# Patient Record
Sex: Male | Born: 1950 | Race: White | Hispanic: No | Marital: Married | Smoking: Never smoker
Health system: Southern US, Community
[De-identification: ages and names within clinical notes are randomized; demographics above are authoritative.]

## PROBLEM LIST (undated history)

## (undated) DIAGNOSIS — I10 Essential (primary) hypertension: Secondary | ICD-10-CM

## (undated) DIAGNOSIS — M1990 Unspecified osteoarthritis, unspecified site: Secondary | ICD-10-CM

## (undated) DIAGNOSIS — E119 Type 2 diabetes mellitus without complications: Secondary | ICD-10-CM

## (undated) DIAGNOSIS — G4700 Insomnia, unspecified: Secondary | ICD-10-CM

## (undated) DIAGNOSIS — M549 Dorsalgia, unspecified: Secondary | ICD-10-CM

## (undated) DIAGNOSIS — H9190 Unspecified hearing loss, unspecified ear: Secondary | ICD-10-CM

## (undated) DIAGNOSIS — E785 Hyperlipidemia, unspecified: Secondary | ICD-10-CM

## (undated) DIAGNOSIS — C801 Malignant (primary) neoplasm, unspecified: Secondary | ICD-10-CM

## (undated) DIAGNOSIS — G4730 Sleep apnea, unspecified: Secondary | ICD-10-CM

## (undated) HISTORY — DX: Sleep apnea, unspecified: G47.30

## (undated) HISTORY — DX: Dorsalgia, unspecified: M54.9

## (undated) HISTORY — PX: REPLACEMENT TOTAL KNEE: SUR1224

## (undated) HISTORY — PX: ROTATOR CUFF REPAIR: SHX139

## (undated) HISTORY — DX: Hyperlipidemia, unspecified: E78.5

## (undated) HISTORY — DX: Essential (primary) hypertension: I10

## (undated) HISTORY — DX: Insomnia, unspecified: G47.00

## (undated) HISTORY — PX: CARDIAC CATHETERIZATION: SHX172

## (undated) HISTORY — DX: Unspecified hearing loss, unspecified ear: H91.90

---

## 2020-06-24 ENCOUNTER — Encounter: Payer: Self-pay | Admitting: Gastroenterology

## 2020-07-02 ENCOUNTER — Telehealth: Payer: Self-pay

## 2020-07-02 NOTE — Telephone Encounter (Signed)
Called and spoke to patient's wife.  Patient is out of town.  Inquired re: patient's medical hx, PCP, etc.  Patient does not see a PCP but has a Film/video editor in [place].  Requested that patient have Cardiologist, Dr. Sabra Heck fax his records to include last office note, meds, allergies, labs, etc. Wife will call patient and has him to call Dr. Ammie Ferrier office and sign release to have records sent on Monday, 4-18 before NP appointment on Thursday, 4-21. Patient's wife took down our number and fax number and will follow up on records.

## 2020-07-10 ENCOUNTER — Encounter: Payer: Self-pay | Admitting: Gastroenterology

## 2020-07-10 ENCOUNTER — Ambulatory Visit: Payer: Medicare Other | Admitting: Gastroenterology

## 2020-07-10 ENCOUNTER — Other Ambulatory Visit: Payer: Self-pay

## 2020-07-10 VITALS — BP 106/70 | HR 60 | Ht 68.0 in | Wt 193.0 lb

## 2020-07-10 DIAGNOSIS — R1084 Generalized abdominal pain: Secondary | ICD-10-CM

## 2020-07-10 DIAGNOSIS — R194 Change in bowel habit: Secondary | ICD-10-CM | POA: Diagnosis not present

## 2020-07-10 MED ORDER — PLENVU 140 G PO SOLR: 140.0000 g | ORAL | 0 refills | Status: DC

## 2020-07-10 NOTE — Progress Notes (Signed)
07/10/2020 Francisco Frank [id number] Dec 10, 1950   HISTORY OF PRESENT ILLNESS: This is a pleasant 70 year old male who is new to our office.  He is here today as a self-referral to discuss change in bowel habits and generalized abdominal cramping.  He says that these issues just began 2 months ago.  He said he noticed a change in stools with increasing frequency.  He said that he used to have 1 bowel movement a day or every other day.  Now he is going 3 times a day.  The stools are very soft and mushy, but not diarrhea or loose.  He says that he gets generalized abdominal cramps.  He says they are not extremely painful, but uncomfortable.  He says that they used to be not as frequent, but now they occur at least once, sometimes twice a day.  They last 20 minutes sometimes up to an hour.  He also reports of a lot of gas.  He denies seeing any blood in his stools.  He denies any persistent abdominal pain.  He tells me his last colonoscopy was by Dr. Posey Pronto in [place], [place] about 15 or 20 years ago.  He reports being told he had colitis, but was never treated for that.  He does not recall having polyps, but he says he was told to have another colonoscopy in 5 years, which he never proceeded with.  He says that at the time that colonoscopy was performed strictly for screening purposes and he was not having any bowel problems.  He is on Plavix for history of CAD that his prescribed by his cardiologist, Dr. Sabra Heck.   Past Medical History:  Diagnosis Date  . Back pain   . Hearing deficit   . Hyperlipemia   . Hypertension   . Insomnia   . Sleep apnea    Past Surgical History:  Procedure Laterality Date  . CARDIAC CATHETERIZATION    . REPLACEMENT TOTAL KNEE    . ROTATOR CUFF REPAIR      reports that he has never smoked. He quit smokeless tobacco use about 4 years ago.  His smokeless tobacco use included snuff. He reports current alcohol use. He reports that he does not use drugs. family  history includes Alcohol abuse in his father; Cirrhosis in his father; Colon cancer in his maternal grandmother; Colonic polyp in his maternal grandmother; Heart disease in his paternal grandfather. No Known Allergies    Outpatient Encounter Medications as of 07/10/2020  Medication Sig  . atenolol (TENORMIN) 50 MG tablet Take 50 mg by mouth daily.  Marland Kitchen atorvastatin (LIPITOR) 40 MG tablet Take 40 mg by mouth daily.  . benazepril (LOTENSIN) 20 MG tablet Take 20 mg by mouth daily.  . clopidogrel (PLAVIX) 75 MG tablet Take 75 mg by mouth daily.  . traMADol (ULTRAM) 50 MG tablet Take 50 mg by mouth every 4 (four) hours as needed.   No facility-administered encounter medications on file as of 07/10/2020.     REVIEW OF SYSTEMS  : All other systems reviewed and negative except where noted in the History of Present Illness.   PHYSICAL EXAM: BP 106/70   Pulse 60   Ht 5\' 8"  (1.727 m)   Wt 193 lb (87.5 kg)   SpO2 98%   BMI 29.35 kg/m  General: Well developed white male in no acute distress Head: Normocephalic and atraumatic Eyes:  Sclerae anicteric, conjunctiva pink. Ears: Normal auditory acuity Lungs: Clear throughout to auscultation; no W/R/R. Heart: Regular  rate and rhythm; no M/R/G. Abdomen: Soft, non-distended.  BS present.  Non-tender. Rectal:  Will be done at the time of colonoscopy. Musculoskeletal: Symmetrical with no gross deformities  Skin: No lesions on visible extremities Extremities: No edema  Neurological: Alert oriented x 4, grossly non-focal Psychological:  Alert and cooperative. Normal mood and affect  ASSESSMENT AND PLAN: *Change in bowel habits with increased frequency of stools and some related abdominal cramping.  He describes his stools as being mushy, but not diarrhea or loose.  Last colonoscopy 15 to 20 years ago.  He reports some type of colitis.  We do not have those records and he says he was never treated for that.  These issues just began 2 months ago and he  never had any problems in the past.  Not sure what that colitis stuff is about, but he needs another colonoscopy so we will plan to schedule that with Dr. Bryan Lemma.  We discussed trying Benefiber 2 teaspoons mixed in 8 ounces of liquid daily and increasing to twice daily if tolerated to help to bulk his stools.  We discussed trial of antispasmodic, but he prefers to wait on that for now. *Chronic antiplatelet use:  Hold Plavix for 5 days before procedure - will instruct when and how to resume after procedure. Risks and benefits of procedure including bleeding, perforation, infection, missed lesions, medication reactions and possible hospitalization or surgery if complications occur explained. Additional rare but real risk of cardiovascular event such as heart attack or ischemia/infarct of other organs off of Plavix explained and need to seek urgent help if this occurs. Will communicate by phone or EMR with patient's prescribing provider, Dr. Sabra Heck, to confirm that holding Plavix is reasonable in this case.   **The risks, benefits, and alternatives to colonoscopy were discussed with the patient and he consents to proceed.   CC:  No ref. provider found

## 2020-07-10 NOTE — Patient Instructions (Addendum)
If you are age 71 or older, your body mass index should be between 23-30. Your Body mass index is 29.35 kg/m. If this is out of the aforementioned range listed, please consider follow up with your Primary Care Provider.  If you are age 54 or younger, your body mass index should be between 19-25. Your Body mass index is 29.35 kg/m. If this is out of the aformentioned range listed, please consider follow up with your Primary Care Provider.   You have been scheduled for a colonoscopy. Please follow written instructions given to you at your visit today.  Please pick up your prep supplies at the pharmacy within the next 1-3 days. If you use inhalers (even only as needed), please bring them with you on the day of your procedure.  Start Benefiber 2 teaspoons in water or juice. Increase to twice a day as needed.  It was a pleasure to see you today!  Alonza Bogus, PA-C

## 2020-07-15 ENCOUNTER — Telehealth: Payer: Self-pay

## 2020-07-15 ENCOUNTER — Encounter: Payer: Medicare Other | Admitting: Gastroenterology

## 2020-07-15 NOTE — Telephone Encounter (Signed)
Outgoing letter to Dr. Sabra Heck for cardiac clearance to hold Plavix before his scheduled procedure on 08-06-2020. The return fax form [facility] has granted clearance to hold, however he must take ASA 81 mg while off the Plavix. Then the day after procedure restart Plavix and stop the ASA 81 mg.  Patient has been notified and aware to hold and take ASA 81 mg as directed.

## 2020-07-18 ENCOUNTER — Other Ambulatory Visit: Payer: Self-pay | Admitting: Gastroenterology

## 2020-07-21 ENCOUNTER — Other Ambulatory Visit: Payer: Self-pay

## 2020-07-21 ENCOUNTER — Other Ambulatory Visit: Payer: Self-pay | Admitting: Gastroenterology

## 2020-07-21 MED ORDER — CLENPIQ 10-3.5-12 MG-GM -GM/160ML PO SOLN: 320.0000 mL | Freq: Once | ORAL | 0 refills | Status: AC

## 2020-07-21 MED ORDER — SUTAB 1479-225-188 MG PO TABS: 1.0000 | ORAL_TABLET | Freq: Once | ORAL | 0 refills | Status: AC

## 2020-07-23 ENCOUNTER — Encounter: Payer: Medicare Other | Admitting: Internal Medicine

## 2020-08-05 ENCOUNTER — Telehealth: Payer: Self-pay | Admitting: Gastroenterology

## 2020-08-05 NOTE — Telephone Encounter (Signed)
Spoke to patient and we have reviewed the instructions for taking SUTAB.

## 2020-08-05 NOTE — Telephone Encounter (Signed)
Pt is scheduled for colon tomorrow and called to inform that prep that he picked up from his pharmacy yesterday was Sutab, not Plenvu. Pt does not have instructions for that and would like a call back.

## 2020-08-06 ENCOUNTER — Other Ambulatory Visit: Payer: Self-pay

## 2020-08-06 ENCOUNTER — Encounter: Payer: Self-pay | Admitting: Gastroenterology

## 2020-08-06 ENCOUNTER — Ambulatory Visit (AMBULATORY_SURGERY_CENTER): Payer: Medicare Other | Admitting: Gastroenterology

## 2020-08-06 VITALS — BP 110/69 | HR 56 | Temp 96.9°F | Resp 13 | Ht 68.0 in | Wt 193.0 lb

## 2020-08-06 DIAGNOSIS — R194 Change in bowel habit: Secondary | ICD-10-CM | POA: Diagnosis not present

## 2020-08-06 DIAGNOSIS — D125 Benign neoplasm of sigmoid colon: Secondary | ICD-10-CM

## 2020-08-06 DIAGNOSIS — R1084 Generalized abdominal pain: Secondary | ICD-10-CM | POA: Diagnosis not present

## 2020-08-06 DIAGNOSIS — R197 Diarrhea, unspecified: Secondary | ICD-10-CM

## 2020-08-06 DIAGNOSIS — K5730 Diverticulosis of large intestine without perforation or abscess without bleeding: Secondary | ICD-10-CM

## 2020-08-06 DIAGNOSIS — R109 Unspecified abdominal pain: Secondary | ICD-10-CM

## 2020-08-06 MED ORDER — SODIUM CHLORIDE 0.9 % IV SOLN: 500.0000 mL | Freq: Once | INTRAVENOUS | Status: DC

## 2020-08-06 MED ORDER — DICYCLOMINE HCL 10 MG PO CAPS: 10.0000 mg | ORAL_CAPSULE | ORAL | 1 refills | Status: DC | PRN

## 2020-08-06 NOTE — Op Note (Signed)
[facility] Patient Name: Francisco Frank Procedure Date: 08/06/2020 10:38 AM MRN: [id number] Endoscopist: Gerrit Heck , MD Age: 70 Referring MD:  Date of Birth: May 17, 1950 Gender: Male Account #: [id number] Procedure:                Colonoscopy Indications:              Generalized abdominal cramping, Change in bowel                            habits, Diarrhea                           Last colonoscopy was >15 years ago. Medicines:                Monitored Anesthesia Care Procedure:                Pre-Anesthesia Assessment:                           - Prior to the procedure, a History and Physical                            was performed, and patient medications and                            allergies were reviewed. The patient's tolerance of                            previous anesthesia was also reviewed. The risks                            and benefits of the procedure and the sedation                            options and risks were discussed with the patient.                            All questions were answered, and informed consent                            was obtained. Prior Anticoagulants: The patient has                            taken no previous anticoagulant or antiplatelet                            agents. ASA Grade Assessment: II - A patient with                            mild systemic disease. After reviewing the risks                            and benefits, the patient was deemed in  satisfactory condition to undergo the procedure.                           After obtaining informed consent, the colonoscope                            was passed under direct vision. Throughout the                            procedure, the patient's blood pressure, pulse, and                            oxygen saturations were monitored continuously. The                            Olympus CF-HQ190L ([id number]) Colonoscope was                             introduced through the anus and advanced to the the                            terminal ileum. The colonoscopy was performed                            without difficulty. The patient tolerated the                            procedure well. The quality of the bowel                            preparation was good. The terminal ileum, ileocecal                            valve, appendiceal orifice, and rectum were                            photographed. Scope In: 10:46:10 AM Scope Out: 11:02:59 AM Scope Withdrawal Time: 0 hours 14 minutes 21 seconds  Total Procedure Duration: 0 hours 16 minutes 49 seconds  Findings:                 The perianal and digital rectal examinations were                            normal.                           A 8 mm polyp was found in the sigmoid colon. The                            polyp was sessile. The polyp was removed with a                            cold snare. Resection and retrieval were complete.  Estimated blood loss was minimal.                           A few small and large-mouthed diverticula were                            found in the sigmoid colon.                           Normal mucosa was found in the entire colon.                            Biopsies for histology were taken with a cold                            forceps from the right colon and left colon for                            evaluation of microscopic colitis. Estimated blood                            loss was minimal.                           The retroflexed view of the distal rectum and anal                            verge was normal and showed no anal or rectal                            abnormalities.                           The terminal ileum appeared normal. Complications:            No immediate complications. Estimated Blood Loss:     Estimated blood loss was minimal. Impression:               - One 8 mm polyp in the  sigmoid colon, removed with                            a cold snare. Resected and retrieved.                           - Diverticulosis in the sigmoid colon.                           - Normal mucosa in the entire examined colon.                            Biopsied.                           - The distal rectum and anal verge are normal on  retroflexion view.                           - The examined portion of the ileum was normal. Recommendation:           - Patient has a contact number available for                            emergencies. The signs and symptoms of potential                            delayed complications were discussed with the                            patient. Return to normal activities tomorrow.                            Written discharge instructions were provided to the                            patient.                           - Resume previous diet.                           - Continue present medications.                           - Await pathology results.                           - Repeat colonoscopy for surveillance based on                            pathology results.                           - Return to GI office at appointment to be                            scheduled.                           - Use fiber, for example Citrucel, Fibercon, Konsyl                            or Metamucil.                           - Use Bentyl (dicyclomine) 10 mg PO Q6 hours as                            needed for abdominal cramping. Gerrit Heck, MD 08/06/2020 11:09:06 AM

## 2020-08-06 NOTE — Progress Notes (Signed)
PT taken to PACU. Monitors in place. VSS. Report given to RN. 

## 2020-08-06 NOTE — Patient Instructions (Signed)
YOU HAD AN ENDOSCOPIC PROCEDURE TODAY AT THE [facility]:   Refer to the procedure report that was given to you for any specific questions about what was found during the examination.  If the procedure report does not answer your questions, please call your gastroenterologist to clarify.  If you requested that your care partner not be given the details of your procedure findings, then the procedure report has been included in a sealed envelope for you to review at your convenience later.  YOU SHOULD EXPECT: Some feelings of bloating in the abdomen. Passage of more gas than usual.  Walking can help get rid of the air that was put into your GI tract during the procedure and reduce the bloating. If you had a lower endoscopy (such as a colonoscopy or flexible sigmoidoscopy) you may notice spotting of blood in your stool or on the toilet paper. If you underwent a bowel prep for your procedure, you may not have a normal bowel movement for a few days.  Please Note:  You might notice some irritation and congestion in your nose or some drainage.  This is from the oxygen used during your procedure.  There is no need for concern and it should clear up in a day or so.  SYMPTOMS TO REPORT IMMEDIATELY:   Following lower endoscopy (colonoscopy or flexible sigmoidoscopy):  Excessive amounts of blood in the stool  Significant tenderness or worsening of abdominal pains  Swelling of the abdomen that is new, acute  Fever of 100F or higher   Following upper endoscopy (EGD)  Vomiting of blood or coffee ground material  New chest pain or pain under the shoulder blades  Painful or persistently difficult swallowing  New shortness of breath  Fever of 100F or higher  Black, tarry-looking stools  For urgent or emergent issues, a gastroenterologist can be reached at any hour by calling [phone]. Do not use MyChart messaging for urgent concerns.    DIET:  We do recommend a small meal at first, but  then you may proceed to your regular diet.  Drink plenty of fluids but you should avoid alcoholic beverages for 24 hours.  ACTIVITY:  You should plan to take it easy for the rest of today and you should NOT DRIVE or use heavy machinery until tomorrow (because of the sedation medicines used during the test).    FOLLOW UP: Our staff will call the number listed on your records 48-72 hours following your procedure to check on you and address any questions or concerns that you may have regarding the information given to you following your procedure. If we do not reach you, we will leave a message.  We will attempt to reach you two times.  During this call, we will ask if you have developed any symptoms of COVID 19. If you develop any symptoms (ie: fever, flu-like symptoms, shortness of breath, cough etc.) before then, please call [phone].  If you test positive for Covid 19 in the 2 weeks post procedure, please call and report this information to us.    If any biopsies were taken you will be contacted by phone or by letter within the next 1-3 weeks.  Please call us at [phone] if you have not heard about the biopsies in 3 weeks.    SIGNATURES/CONFIDENTIALITY: You and/or your care partner have signed paperwork which will be entered into your electronic medical record.  These signatures attest to the fact that that the information above on   your After Visit Summary has been reviewed and is understood.  Full responsibility of the confidentiality of this discharge information lies with you and/or your care-partner. 

## 2020-08-07 ENCOUNTER — Telehealth: Payer: Self-pay

## 2020-08-07 NOTE — Telephone Encounter (Signed)
Per 08/06/20 procedure report - Return to GI office at appointment to be scheduled  Spoke with patient and he has been scheduled for a follow up with Alonza Bogus, PA on Tuesday, 09/02/20 at 11 AM. Advised patient that I will send a letter to his home address on file with the appointment information. Patient verbalized understanding and had no concerns at the end of the call.

## 2020-08-08 ENCOUNTER — Telehealth: Payer: Self-pay | Admitting: *Deleted

## 2020-08-08 NOTE — Telephone Encounter (Signed)
1. Have you developed a fever since your procedure? no  2.   Have you had an respiratory symptoms (SOB or cough) since your procedure? no  3.   Have you tested positive for COVID 19 since your procedure no  4.   Have you had any family members/close contacts diagnosed with the COVID 19 since your procedure?  no   If yes to any of these questions please route to Joylene John, RN and Joella Prince, RN Follow up Call-  Call back number 08/06/2020  Post procedure Call Back phone  # [phone]  Permission to leave phone message Yes     Patient questions:  Do you have a fever, pain , or abdominal swelling? No. Pain Score  0 *  Have you tolerated food without any problems? Yes.    Have you been able to return to your normal activities? Yes.    Do you have any questions about your discharge instructions: Diet   No. Medications  No. Follow up visit  No.  Do you have questions or concerns about your Care? No.  Actions: * If pain score is 4 or above: No action needed, pain <4.

## 2020-08-14 ENCOUNTER — Encounter: Payer: Self-pay | Admitting: Gastroenterology

## 2020-08-30 ENCOUNTER — Other Ambulatory Visit: Payer: Self-pay | Admitting: Gastroenterology

## 2020-08-30 DIAGNOSIS — R197 Diarrhea, unspecified: Secondary | ICD-10-CM

## 2020-08-30 DIAGNOSIS — R1084 Generalized abdominal pain: Secondary | ICD-10-CM

## 2020-08-30 DIAGNOSIS — R194 Change in bowel habit: Secondary | ICD-10-CM

## 2020-08-30 DIAGNOSIS — R109 Unspecified abdominal pain: Secondary | ICD-10-CM

## 2020-09-02 ENCOUNTER — Ambulatory Visit: Payer: Medicare Other | Admitting: Gastroenterology

## 2020-09-03 ENCOUNTER — Telehealth: Payer: Self-pay | Admitting: *Deleted

## 2020-09-03 ENCOUNTER — Encounter: Payer: Self-pay | Admitting: Gastroenterology

## 2020-09-03 ENCOUNTER — Telehealth: Payer: Self-pay | Admitting: Gastroenterology

## 2020-09-03 ENCOUNTER — Other Ambulatory Visit (INDEPENDENT_AMBULATORY_CARE_PROVIDER_SITE_OTHER): Payer: Medicare Other

## 2020-09-03 ENCOUNTER — Ambulatory Visit: Payer: Medicare Other | Admitting: Gastroenterology

## 2020-09-03 VITALS — BP 118/74 | HR 59 | Ht 68.0 in | Wt 175.0 lb

## 2020-09-03 DIAGNOSIS — R1084 Generalized abdominal pain: Secondary | ICD-10-CM

## 2020-09-03 DIAGNOSIS — R634 Abnormal weight loss: Secondary | ICD-10-CM | POA: Insufficient documentation

## 2020-09-03 DIAGNOSIS — R5383 Other fatigue: Secondary | ICD-10-CM

## 2020-09-03 DIAGNOSIS — R631 Polydipsia: Secondary | ICD-10-CM | POA: Diagnosis not present

## 2020-09-03 LAB — CBC
HCT: 41.1 % (ref 39.0–52.0)
Hemoglobin: 14.1 g/dL (ref 13.0–17.0)
MCHC: 34.3 g/dL (ref 30.0–36.0)
MCV: 91.2 fl (ref 78.0–100.0)
Platelets: 190 10*3/uL (ref 150.0–400.0)
RBC: 4.5 Mil/uL (ref 4.22–5.81)
RDW: 13.3 % (ref 11.5–15.5)
WBC: 9 10*3/uL (ref 4.0–10.5)

## 2020-09-03 LAB — COMPREHENSIVE METABOLIC PANEL
ALT: 29 U/L (ref 0–53)
AST: 16 U/L (ref 0–37)
Albumin: 4.5 g/dL (ref 3.5–5.2)
Alkaline Phosphatase: 96 U/L (ref 39–117)
BUN: 22 mg/dL (ref 6–23)
CO2: 24 mEq/L (ref 19–32)
Calcium: 9.8 mg/dL (ref 8.4–10.5)
Chloride: 92 mEq/L — ABNORMAL LOW (ref 96–112)
Creatinine, Ser: 1.33 mg/dL (ref 0.40–1.50)
GFR: 53.89 mL/min — ABNORMAL LOW (ref >60.00)
Glucose, Bld: 731 mg/dL (ref 70–99)
Potassium: 4.4 mEq/L (ref 3.5–5.1)
Sodium: 127 mEq/L — ABNORMAL LOW (ref 135–145)
Total Bilirubin: 0.7 mg/dL (ref 0.2–1.2)
Total Protein: 7.5 g/dL (ref 6.0–8.3)

## 2020-09-03 LAB — IBC + FERRITIN
Ferritin: 665.4 ng/mL — ABNORMAL HIGH (ref 22.0–322.0)
Iron: 96 ug/dL (ref 42–165)
Saturation Ratios: 25.5 % (ref 20.0–50.0)
Transferrin: 269 mg/dL (ref 212.0–360.0)

## 2020-09-03 LAB — VITAMIN D 25 HYDROXY (VIT D DEFICIENCY, FRACTURES): VITD: 26.08 ng/mL — ABNORMAL LOW (ref 30.00–100.00)

## 2020-09-03 LAB — B12 AND FOLATE PANEL
Folate: 18.7 ng/mL (ref >5.9)
Vitamin B-12: 516 pg/mL (ref 211–911)

## 2020-09-03 NOTE — Telephone Encounter (Signed)
Pt would like to r/s CT scan because it conflicts with her wife's scheduled colonoscopy.

## 2020-09-03 NOTE — Telephone Encounter (Signed)
Lab called with a critical lab report patient's glucose is 731. Per Janett Billow, [place] patient needs to go to ER. Spoke with patient to inform him that he needs to go the ER because he blood sugar is 731.

## 2020-09-03 NOTE — Progress Notes (Signed)
09/03/2020 Francisco Frank [id number] 07-Oct-1950   HISTORY OF PRESENT ILLNESS: This is a 70 year old male who is a patient Dr. Vivia Ewing.  He just underwent colonoscopy in May for evaluation of some change in bowel habits.  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  Repeat recommended at a 7-year interval.  He is here today for follow-up.  He tells me that over the past week or so his stools have mostly been back to normal.  His major concern at this point is about a 20 pound weight loss since April.  He says he gets extremely fatigued easily.  He says that he is one who tends to be on the go all the time, but even just trimming a couple of shrubs at his house has him completely wiped out.  He denies really any shortness of breath or chest pain.  He also reports that he is drinking a ton of fluids recently and continues to have a dry mouth.  He also reports some vague upper abdominal pain/discomfort that tends to come and go.  He says that it is not there all the time.  He does not have a PCP.  He tells me that his cardiologist did some blood work back in May, but he is unsure where he checked.  He thinks that he they usually check his cholesterol levels, blood sugar, etc.    Past Medical History:  Diagnosis Date   Back pain    Hearing deficit    Hyperlipemia    Hypertension    Insomnia    Sleep apnea    Past Surgical History:  Procedure Laterality Date   CARDIAC CATHETERIZATION     REPLACEMENT TOTAL KNEE     ROTATOR CUFF REPAIR      reports that he has never smoked. His smokeless tobacco use includes snuff. He reports current alcohol use. He reports that he does not use drugs. family history includes Alcohol abuse in his father; Cirrhosis in his father; Colon cancer in his maternal grandmother; Colonic polyp in his maternal grandmother; Heart disease in his paternal grandfather. No Known Allergies    Outpatient  Encounter Medications as of 09/03/2020  Medication Sig   atenolol (TENORMIN) 50 MG tablet Take 50 mg by mouth daily.   atorvastatin (LIPITOR) 40 MG tablet Take 40 mg by mouth daily.   benazepril (LOTENSIN) 20 MG tablet Take 20 mg by mouth daily.   clopidogrel (PLAVIX) 75 MG tablet Take 75 mg by mouth daily.   dicyclomine (BENTYL) 10 MG capsule Take 1 capsule (10 mg total) by mouth as needed for spasms. 10 mg po q 6 hours prn   traMADol (ULTRAM) 50 MG tablet Take 50 mg by mouth every 4 (four) hours as needed.   No facility-administered encounter medications on file as of 09/03/2020.     REVIEW OF SYSTEMS  : All other systems reviewed and negative except where noted in the History of Present Illness.   PHYSICAL EXAM: BP 118/74   Pulse (!) 59   Ht 5\' 8"  (1.727 m)   Wt 175 lb (79.4 kg)   SpO2 97%   BMI 26.61 kg/m  General: Well developed white male in no acute distress Head: Normocephalic and atraumatic Eyes:  Sclerae anicteric, conjunctiva pink. Ears: Normal auditory acuity Lungs: Clear throughout to auscultation; no W/R/R. Heart: Regular rate and rhythm; no M/R/G. Abdomen: Soft, nontender, non distended. No masses or hepatomegal Musculoskeletal:  Symmetrical with no gross deformities  Skin: No lesions on visible extremities Extremities: No edema  Neurological: Alert oriented x 4, grossly non-focal Psychological:  Alert and cooperative. Normal mood and affect  ASSESSMENT AND PLAN: *70 year old male with complaints of 20 pound weight loss since April, extreme fatigue, generalized abdominal pain, but more so in the upper abdomen.  He does not have a PCP.  He tells me he had labs done just last month by his cardiologist in [place], [place], but unsure what he checked.  Unfortunately I do not have access to those labs.  I am going to check some blood work today including CBC, CMP, TSH, iron studies, vitamin B12 and folate levels, and vitamin D levels.  He needs to establish with a PCP  especially with his advancing age.  Will plan for CT scan of the abdomen and pelvis without contrast to start due to IV contrast shortage. *Polydipsia/dry mouth: Drinking extremely large amounts of liquid every day.  Only thing I can think of would be new onset diabetes versus Sjogren's.  He thinks that his blood sugar was recently checked by his cardiologist.  We are going to recheck it today as above.  **Addendum: Received a critical lab with a glucose of over 700.  Patient has been contacted and sent to the emergency department.   CC:  No ref. provider found

## 2020-09-03 NOTE — Patient Instructions (Signed)
Your provider has requested that you go to the basement level for lab work before leaving today. Press "B" on the elevator. The lab is located at the first door on the left as you exit the elevator.  You have been scheduled for a CT scan of the abdomen and pelvis at [facility] ([address]---this is in the same building as [employer]).   You are scheduled on Tuesday 09/09/20 at 10:30 am. You should arrive 15 minutes prior to your appointment time for registration. Please follow the written instructions below on the day of your exam:  WARNING: IF YOU ARE ALLERGIC TO IODINE/X-RAY DYE, PLEASE NOTIFY RADIOLOGY IMMEDIATELY AT [phone]! YOU WILL BE GIVEN A 13 HOUR PREMEDICATION PREP.  1) Do not eat or drink anything after 6:30 am (4 hours prior to your test) 2) You have been given 2 bottles of oral contrast to drink. The solution may taste better if refrigerated, but do NOT add ice or any other liquid to this solution. Shake well before drinking.    Drink 1 bottle of contrast @ 8:30 am (2 hours prior to your exam)  Drink 1 bottle of contrast @ 9:30 am (1 hour prior to your exam)  You may take any medications as prescribed with a small amount of water, if necessary. If you take any of the following medications: METFORMIN, GLUCOPHAGE, GLUCOVANCE, AVANDAMET, RIOMET, FORTAMET, [place] MET, JANUMET, GLUMETZA or METAGLIP, you MAY be asked to HOLD this medication 48 hours AFTER the exam.  The purpose of you drinking the oral contrast is to aid in the visualization of your intestinal tract. The contrast solution may cause some diarrhea. Depending on your individual set of symptoms, you may also receive an intravenous injection of x-ray contrast/dye. Plan on being at [facility] for 30 minutes or longer, depending on the type of exam you are having performed.  This test typically takes 30-45 minutes to complete.  If you have any questions regarding your exam or if you  need to reschedule, you may call the CT department at [phone] between the hours of 8:00 am and 5:00 pm, Monday-Friday.  ______________________________________________________________  If you are age 71 or older, your body mass index should be between 23-30. Your Body mass index is 26.61 kg/m. If this is out of the aforementioned range listed, please consider follow up with your Primary Care Provider.  If you are age 69 or younger, your body mass index should be between 19-25. Your Body mass index is 26.61 kg/m. If this is out of the aformentioned range listed, please consider follow up with your Primary Care Provider.   __________________________________________________________  The [facility] providers would like to encourage you to use [facility] to communicate with providers for non-urgent requests or questions.  Due to long hold times on the telephone, sending your provider a message by [facility] may be a faster and more efficient way to get a response.  Please allow 48 business hours for a response.  Please remember that this is for non-urgent requests.

## 2020-09-04 NOTE — Progress Notes (Signed)
Agree with the assessment and plan as outlined by Jessica Zehr, PA-C. ? ?Amaro Mangold, DO, FACG ? ?

## 2020-09-04 NOTE — Telephone Encounter (Signed)
Spoke with patient's wife and patient was admitted to [facility] yesterday due to his blood sugar and they did a CT scan. Patient's wife will have his stop by and sign a records release on Tuesday when they are here for her colonoscopy.

## 2020-09-05 NOTE — Telephone Encounter (Signed)
Spoke with Beatrice CT and canceled CT scan.

## 2020-09-09 ENCOUNTER — Inpatient Hospital Stay: Admission: RE | Admit: 2020-09-09 | Payer: Medicare Other | Source: Ambulatory Visit

## 2020-10-20 ENCOUNTER — Encounter: Payer: Self-pay | Admitting: Physician Assistant

## 2020-10-20 ENCOUNTER — Other Ambulatory Visit (INDEPENDENT_AMBULATORY_CARE_PROVIDER_SITE_OTHER): Payer: Medicare Other

## 2020-10-20 ENCOUNTER — Ambulatory Visit: Payer: Medicare Other | Admitting: Physician Assistant

## 2020-10-20 VITALS — BP 120/70 | HR 102 | Ht 68.0 in | Wt 167.0 lb

## 2020-10-20 DIAGNOSIS — R1013 Epigastric pain: Secondary | ICD-10-CM | POA: Diagnosis not present

## 2020-10-20 DIAGNOSIS — R634 Abnormal weight loss: Secondary | ICD-10-CM | POA: Diagnosis not present

## 2020-10-20 DIAGNOSIS — R933 Abnormal findings on diagnostic imaging of other parts of digestive tract: Secondary | ICD-10-CM | POA: Diagnosis not present

## 2020-10-20 DIAGNOSIS — R195 Other fecal abnormalities: Secondary | ICD-10-CM

## 2020-10-20 LAB — COMPREHENSIVE METABOLIC PANEL
ALT: 252 U/L — ABNORMAL HIGH (ref 0–53)
AST: 281 U/L — ABNORMAL HIGH (ref 0–37)
Albumin: 4.5 g/dL (ref 3.5–5.2)
Alkaline Phosphatase: 269 U/L — ABNORMAL HIGH (ref 39–117)
BUN: 31 mg/dL — ABNORMAL HIGH (ref 6–23)
CO2: 21 mEq/L (ref 19–32)
Calcium: 9.8 mg/dL (ref 8.4–10.5)
Chloride: 107 mEq/L (ref 96–112)
Creatinine, Ser: 1.87 mg/dL — ABNORMAL HIGH (ref 0.40–1.50)
GFR: 35.77 mL/min — ABNORMAL LOW (ref >60.00)
Glucose, Bld: 181 mg/dL — ABNORMAL HIGH (ref 70–99)
Potassium: 5.1 mEq/L (ref 3.5–5.1)
Sodium: 136 mEq/L (ref 135–145)
Total Bilirubin: 0.6 mg/dL (ref 0.2–1.2)
Total Protein: 7.5 g/dL (ref 6.0–8.3)

## 2020-10-20 LAB — CBC WITH DIFFERENTIAL/PLATELET
Basophils Absolute: 0.1 10*3/uL (ref 0.0–0.1)
Basophils Relative: 2 % (ref 0.0–3.0)
Eosinophils Absolute: 0.1 10*3/uL (ref 0.0–0.7)
Eosinophils Relative: 2.4 % (ref 0.0–5.0)
HCT: 39.5 % (ref 39.0–52.0)
Hemoglobin: 13.1 g/dL (ref 13.0–17.0)
Lymphocytes Relative: 37.9 % (ref 12.0–46.0)
Lymphs Abs: 2.3 10*3/uL (ref 0.7–4.0)
MCHC: 33 g/dL (ref 30.0–36.0)
MCV: 92.3 fl (ref 78.0–100.0)
Monocytes Absolute: 0.5 10*3/uL (ref 0.1–1.0)
Monocytes Relative: 8.9 % (ref 3.0–12.0)
Neutro Abs: 3 10*3/uL (ref 1.4–7.7)
Neutrophils Relative %: 48.8 % (ref 43.0–77.0)
Platelets: 169 10*3/uL (ref 150.0–400.0)
RBC: 4.28 Mil/uL (ref 4.22–5.81)
RDW: 14.5 % (ref 11.5–15.5)
WBC: 6.2 10*3/uL (ref 4.0–10.5)

## 2020-10-20 LAB — LIPASE: Lipase: 23 U/L (ref 11.0–59.0)

## 2020-10-20 NOTE — Patient Instructions (Signed)
If you are age 71 or younger, your body mass index should be between 19-25. Your Body mass index is 25.39 kg/m. If this is out of the aformentioned range listed, please consider follow up with your Primary Care Provider.  __________________________________________________________  The [place] [facility] providers would like to encourage you to use [facility] to communicate with providers for non-urgent requests or questions.  Due to long hold times on the telephone, sending your provider a message by [facility] may be a faster and more efficient way to get a response.  Please allow 48 business hours for a response.  Please remember that this is for non-urgent requests.   You have been scheduled for an MRI at [facility] on 10/29/2020. Your appointment time is 9:00 am. Please arrive to admitting (at main entrance of the hospital) 30 minutes prior to your appointment time for registration purposes. Please make certain not to have anything to eat or drink 4 hours prior to your test. In addition, if you have any metal in your body, have a pacemaker or defibrillator, please be sure to let your ordering physician know. This test typically takes 45 minutes to 1 hour to complete. Should you need to reschedule, please call [phone] to do so.  Your provider has requested that you go to the basement level for lab work before leaving today. Press "B" on the elevator. The lab is located at the first door on the left as you exit the elevator.  Follow up pending the results of your MRI.  Thank you for entrusting me with your care and choosing [facility].  Amy Esterwood, PA-C

## 2020-10-20 NOTE — Progress Notes (Signed)
Subjective:    Patient ID: Francisco Frank, male    DOB: 09-24-1950, 70 y.o.   MRN: [id number]  HPI Francisco Frank is a pleasant 70 year old white male, established with Dr. Bryan Frank.  He comes in today with continued weight loss and loose stools and to discuss recent CT scan. He had undergone colonoscopy here in May 2022 because of changes in bowel habits with loose stools.  He was found to have diverticulosis and one 8 mm polyp was removed which was a tubular adenoma. He was seen back 09/03/2020 by Francisco Bogus, PA-C with complaints of persistent weight loss and at that point had lost about 20 pounds since April.  He was also complaining of significant fatigue and polydipsia and vague upper abdominal discomfort. He had multiple labs drawn and was noted to have a sodium of 127, and glucose of 731 BUN 22/creatinine 1.33, LFTs within normal limits, CBC within normal limits Ferritin 665/iron 96/transferrin 269/iron sat 25.5. He was referred to the emergency room that same day because of the severe hyperglycemia.  He was admitted to the hospital in [place], initially started on sliding scale insulin and has now been converted to NovoLog.  He has established with a PCP/Dr. Shearon Frank in [place]. He had a CT of the abdomen pelvis done while he was hospitalized and he brings a copy with him today.  This was done without IV contrast and showed apparent minimal stranding at the pancreatic head may be due to parenchymal atrophy simulating fat stranding however recommend correlation with lipase to exclude pancreatitis.  He has cholelithiasis without cholecystitis and moderate stool burden as well as colonic diverticulosis.  Patient says despite having good control of his blood sugars which she has been monitoring very closely he is still losing weight.  He is eating 3-4 meals per day, has no complaints of nausea or vomiting.  He is down about 33 pounds total since the end of February. He also continues to feel very  fatigued and says he wears out very easily.  The thirst has resolved.  He is having bowel movements which she describes as mushy and oily.  Also with intermittent cramping and vague nagging discomfort in the upper abdomen no ongoing significant abdominal pain.  He has no prior history of pancreatitis. He mentions that nobody in his family has had diabetes. On further questioning he had been a heavy consumer of alcohol over many years and says that he cut way back about 10 to 15 years ago but will still drink some alcohol socially and may have 2-3 drinks at a time. Review of Systems.  Pertinent positive and negative review of systems were noted in the above HPI section.  All other review of systems was otherwise negative.   Outpatient Encounter Medications as of 10/20/2020  Medication Sig   atenolol (TENORMIN) 50 MG tablet Take 50 mg by mouth daily.   atorvastatin (LIPITOR) 40 MG tablet Take 40 mg by mouth daily.   benazepril (LOTENSIN) 20 MG tablet Take 20 mg by mouth daily.   clopidogrel (PLAVIX) 75 MG tablet Take 75 mg by mouth daily.   dicyclomine (BENTYL) 10 MG capsule Take 1 capsule (10 mg total) by mouth as needed for spasms. 10 mg po q 6 hours prn   Insulin Glargine (BASAGLAR KWIKPEN) 100 UNIT/ML Inject into the skin.   NOVOLOG FLEXPEN 100 UNIT/ML FlexPen SMARTSIG:10 Unit(s) SUB-Q 3 Times Daily   traMADol (ULTRAM) 50 MG tablet Take 50 mg by mouth every 4 (four) hours as  needed.   No facility-administered encounter medications on file as of 10/20/2020.   No Known Allergies Patient Active Problem List   Diagnosis Date Noted   Loss of weight 09/03/2020   Fatigue 09/03/2020   Polydipsia 09/03/2020   Change in bowel habit 07/10/2020   Generalized abdominal pain 07/10/2020   Social History   Socioeconomic History   Marital status: Married    Spouse name: Not on file   Number of children: Not on file   Years of education: Not on file   Highest education level: Not on file  Occupational  History   Not on file  Tobacco Use   Smoking status: Never   Smokeless tobacco: Current    Types: Snuff    Last attempt to quit: 2018  Vaping Use   Vaping Use: Never used  Substance and Sexual Activity   Alcohol use: Yes    Comment: rare   Drug use: Never   Sexual activity: Not Currently  Other Topics Concern   Not on file  Social History Narrative   Not on file   Social Determinants of Health   Financial Resource Strain: Not on file  Food Insecurity: Not on file  Transportation Needs: Not on file  Physical Activity: Not on file  Stress: Not on file  Social Connections: Not on file  Intimate Partner Violence: Not on file    [name] family history includes Alcohol abuse in his father; Cirrhosis in his father; Colon cancer in his maternal grandmother; Colonic polyp in his maternal grandmother; Heart disease in his paternal grandfather.      Objective:    Vitals:   10/20/20 1046  BP: 120/70  Pulse: (!) 102  SpO2: 98%    Physical Exam Well-developed well-nourished older white male in no acute distress.  Accompanied by his wife weight, 167 BMI 25.3  HEENT; nontraumatic normocephalic, EOMI, PE R LA, sclera anicteric. Oropharynx; not examined today Neck; supple, no JVD Cardiovascular; regular rate and rhythm with S1-S2, no murmur rub or gallop Pulmonary; Clear bilaterally Abdomen; soft, no focal tenderness nondistended, no palpable mass or hepatosplenomegaly, bowel sounds are active Rectal; not done today Skin; benign exam, no jaundice rash or appreciable lesions Extremities; no clubbing cyanosis or edema skin warm and dry Neuro/Psych; alert and oriented x4, grossly nonfocal mood and affect appropriate        Assessment & Plan:   #51 70 year old white male with new diagnosis of diabetes, presenting with fatigue, weight loss and polydipsia in mid June.  Found to have glucose of greater than 700. He has been started on insulin, and is monitoring his blood  sugars carefully.  Blood sugars appear to be under good control. Patient is continued to have persistent weight loss and is now down 33 pounds since February 2022.  He continues to complain of fatigue, vague upper abdominal discomfort, and has had persistent changes in his bowel habits with what he describes as mushy oily/floating stools.  In addition he had noncontrasted CT imaging while he was hospitalized in [place] at the time he was found to have the severe hyperglycemia in mid June. CT suggested some minimal stranding about the pancreatic head versus possible pancreatic atrophy.  With above constellation of symptoms will need to rule out pancreatic neoplasm, rule out underlying chronic pancreatitis with pancreatic insufficiency.  #2 cholelithiasis #3 diverticulosis #4  Adenomatous colon polyp-just had colonoscopy May 2022 #5 previous history of heavy EtOH  Plan; Patient will be scheduled for MRI of the  abdomen/attention pancreas CBC with differential, c-Met, lipase, fecal elastase. We discussed lower fat diet in addition to his diabetic restrictions Continue dicyclomine 10 mg every 6 hours as needed until further diagnostic work-up completed.  Further recommendations pending results of MRI and above labs.  Theodosia Bahena S Naraya Stoneberg PA-C 10/20/2020   Cc: No ref. provider found

## 2020-10-21 ENCOUNTER — Other Ambulatory Visit: Payer: Self-pay

## 2020-10-21 DIAGNOSIS — R7989 Other specified abnormal findings of blood chemistry: Secondary | ICD-10-CM

## 2020-10-21 DIAGNOSIS — I10 Essential (primary) hypertension: Secondary | ICD-10-CM

## 2020-10-23 ENCOUNTER — Telehealth: Payer: Self-pay | Admitting: Physician Assistant

## 2020-10-23 NOTE — Telephone Encounter (Signed)
Patient has seen his PCP. Dr Shearon Stalls is referring him to [facility]. Patient requests his recent labs be faxed to there at [phone].

## 2020-10-27 ENCOUNTER — Telehealth: Payer: Self-pay | Admitting: Physician Assistant

## 2020-10-27 NOTE — Telephone Encounter (Signed)
Called patient's insurance company. The reason for peer review was discussed. Advised that patient's CT which was done at a different facility did not give enough information to treat the patient as the CT shows a concern for cancer. A copy of patient's CT has been faxed to his insurance company for review.

## 2020-10-27 NOTE — Telephone Encounter (Signed)
Ileene Musa, could you please help me with scheduling a peer to peer with Nicoletta Ba for this pt's MRI, clinicals have been reviewed but a peer to peer is the next option to get this approved. Thank you!  Case# [id number] Peer to peer scheduling:  [place]

## 2020-10-28 NOTE — Progress Notes (Signed)
Agree with the assessment and plan as outlined by Amy Esterwood, PA-C.  Lasondra Hodgkins, DO, FACG  

## 2020-10-28 NOTE — Telephone Encounter (Signed)
Called to follow up on prior authorization. Notice should come through this afternoon. It looks like the Abdomen will get approved but the pelvis will not. I'll send a message to Amy to let her know.

## 2020-10-29 ENCOUNTER — Ambulatory Visit (HOSPITAL_COMMUNITY): Admission: RE | Admit: 2020-10-29 | Payer: Medicare Other | Source: Ambulatory Visit

## 2020-11-05 ENCOUNTER — Telehealth: Payer: Self-pay | Admitting: Physician Assistant

## 2020-11-05 NOTE — Telephone Encounter (Signed)
Inbound call from patient's PCP, Dr. Shearon Stalls requesting a call from App please.  States patient is looking more jaundice.

## 2020-11-05 NOTE — Telephone Encounter (Signed)
I called the office of Dr Shearon Stalls but did not get to speak with anyone. I left a message on the nurse line asking to verify the telephone number. I left my desk number and the office telephone number.

## 2020-11-05 NOTE — Telephone Encounter (Addendum)
Dr Bryan Lemma This is a patient seen by Nicoletta Ba, PA for concerns of weight loss, elevated LFT and concerns of pancreatic neoplasm.  Amy Trellis Paganini is out of the office today due to illness. The PCP is concerned about the patient and would like to speak with a provider. The patient is scheduled for labs and an MRI of the abdomen tomorrow.  Would you call the PCP and speak with his regarding the patient? Dr Shearon Stalls [phone]

## 2020-11-06 ENCOUNTER — Other Ambulatory Visit (INDEPENDENT_AMBULATORY_CARE_PROVIDER_SITE_OTHER): Payer: Medicare Other

## 2020-11-06 ENCOUNTER — Other Ambulatory Visit: Payer: Self-pay

## 2020-11-06 ENCOUNTER — Encounter: Payer: Self-pay | Admitting: Gastroenterology

## 2020-11-06 ENCOUNTER — Other Ambulatory Visit: Payer: Self-pay | Admitting: Physician Assistant

## 2020-11-06 ENCOUNTER — Ambulatory Visit (HOSPITAL_COMMUNITY)
Admission: RE | Admit: 2020-11-06 | Discharge: 2020-11-06 | Disposition: A | Discharge status: Home / Self Care | Payer: Medicare Other | Source: Ambulatory Visit | Attending: Physician Assistant | Admitting: Physician Assistant

## 2020-11-06 ENCOUNTER — Ambulatory Visit (HOSPITAL_COMMUNITY): Payer: Medicare Other

## 2020-11-06 ENCOUNTER — Ambulatory Visit (INDEPENDENT_AMBULATORY_CARE_PROVIDER_SITE_OTHER): Payer: Medicare Other | Admitting: Gastroenterology

## 2020-11-06 VITALS — BP 120/70 | HR 60 | Ht 68.0 in | Wt 164.1 lb

## 2020-11-06 DIAGNOSIS — R1013 Epigastric pain: Secondary | ICD-10-CM | POA: Diagnosis present

## 2020-11-06 DIAGNOSIS — R933 Abnormal findings on diagnostic imaging of other parts of digestive tract: Secondary | ICD-10-CM

## 2020-11-06 DIAGNOSIS — R5383 Other fatigue: Secondary | ICD-10-CM

## 2020-11-06 DIAGNOSIS — R634 Abnormal weight loss: Secondary | ICD-10-CM | POA: Insufficient documentation

## 2020-11-06 DIAGNOSIS — I10 Essential (primary) hypertension: Secondary | ICD-10-CM | POA: Diagnosis not present

## 2020-11-06 DIAGNOSIS — R7989 Other specified abnormal findings of blood chemistry: Secondary | ICD-10-CM

## 2020-11-06 DIAGNOSIS — R748 Abnormal levels of other serum enzymes: Secondary | ICD-10-CM

## 2020-11-06 DIAGNOSIS — R195 Other fecal abnormalities: Secondary | ICD-10-CM | POA: Diagnosis present

## 2020-11-06 DIAGNOSIS — R17 Unspecified jaundice: Secondary | ICD-10-CM | POA: Diagnosis not present

## 2020-11-06 DIAGNOSIS — R7401 Elevation of levels of liver transaminase levels: Secondary | ICD-10-CM

## 2020-11-06 DIAGNOSIS — L299 Pruritus, unspecified: Secondary | ICD-10-CM

## 2020-11-06 LAB — COMPREHENSIVE METABOLIC PANEL
ALT: 83 U/L — ABNORMAL HIGH (ref 0–53)
AST: 79 U/L — ABNORMAL HIGH (ref 0–37)
Albumin: 3.9 g/dL (ref 3.5–5.2)
Alkaline Phosphatase: 447 U/L — ABNORMAL HIGH (ref 39–117)
BUN: 23 mg/dL (ref 6–23)
CO2: 24 mEq/L (ref 19–32)
Calcium: 9.8 mg/dL (ref 8.4–10.5)
Chloride: 101 mEq/L (ref 96–112)
Creatinine, Ser: 1.41 mg/dL (ref 0.40–1.50)
GFR: 50.18 mL/min — ABNORMAL LOW (ref >60.00)
Glucose, Bld: 252 mg/dL — ABNORMAL HIGH (ref 70–99)
Potassium: 4 mEq/L (ref 3.5–5.1)
Sodium: 136 mEq/L (ref 135–145)
Total Bilirubin: 14.2 mg/dL — ABNORMAL HIGH (ref 0.2–1.2)
Total Protein: 7 g/dL (ref 6.0–8.3)

## 2020-11-06 IMAGING — MR MR ABDOMEN WO/W CM
18 of 20 series · 45 of 48 positions shown · IV contrast (gadavist)
Comparison: None.

CLINICAL DATA: Weight loss. Outside CT demonstrating a pancreatic
abnormality. Epigastric discomfort.

EXAM:
MRI ABDOMEN WITHOUT AND WITH CONTRAST (INCLUDING MRCP)
TECHNIQUE: Multiplanar multisequence MR imaging of the abdomen was performed
both before and after the administration of intravenous contrast.
Heavily T2-weighted images of the biliary and pancreatic ducts were
obtained, and three-dimensional MRCP images were rendered by post
processing.
CONTRAST:  7mL GADAVIST GADOBUTROL 1 MMOL/ML IV SOLN

[Series 3: DWI · axial · 6.0mm · 1.49mm/px · z∈[-77,+175]mm · 4 of 72 slices shown (1 of 2)]
[im 1/72]
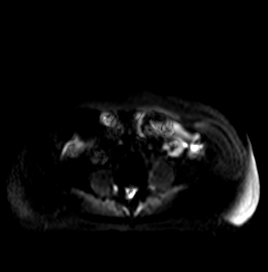
[im 24/72]
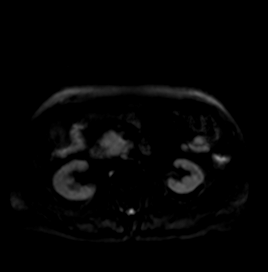
[im 48/72]
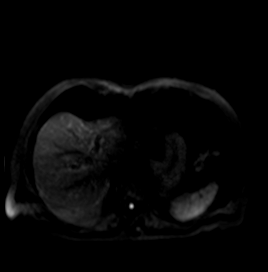
[im 72/72]
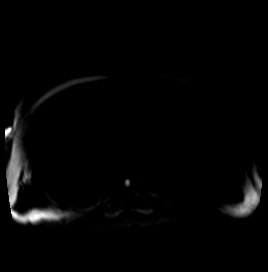

[Series 4: DWI · axial · 6.0mm · 1.49mm/px · z∈[-77,+175]mm · 2 of 36 slices shown (2 of 2)]
[im 1/36]
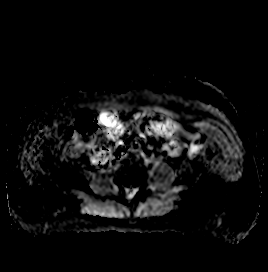
[im 36/36]
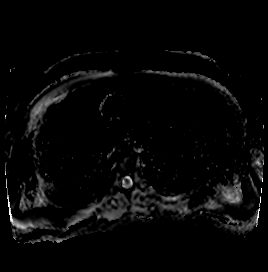

[Series 5: T2 fat-sat · axial · 6.0mm · 1.25mm/px · 1 of 36 slices shown]
[im 1/36]
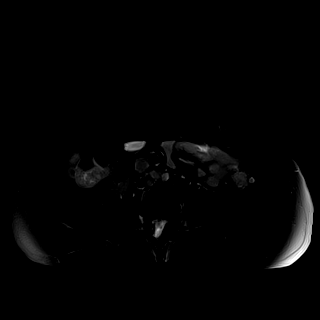

[Series 8: T2 · coronal · 7.0mm · 1.56mm/px · 1 of 33 slices shown (1 of 2)]
[im 1/33]
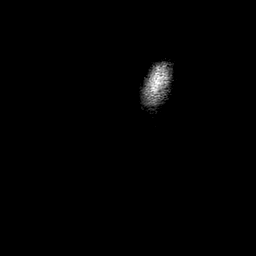

[Series 10: T1 · axial · 3.1mm · 1.25mm/px · z∈[-106,+139]mm · 3 of 80 slices shown (1 of 2)]
[im 1/80]
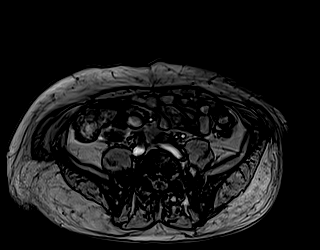
[im 40/80]
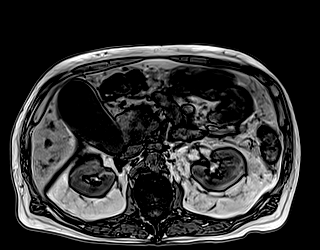
[im 80/80]
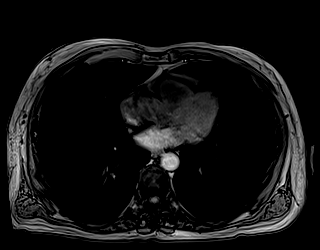

[Series 11: T1 · axial · 3.1mm · 1.25mm/px · z∈[-106,+139]mm · 3 of 80 slices shown (2 of 2)]
[im 1/80]
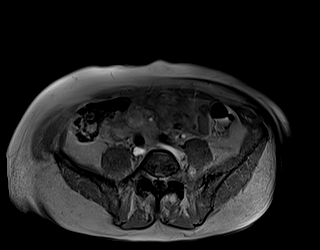
[im 40/80]
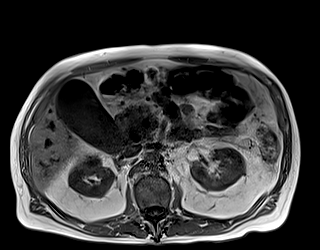
[im 80/80]
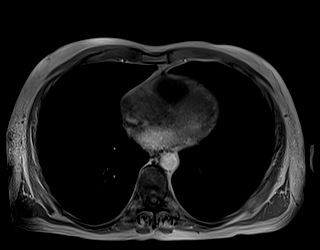

[Series 12: bSSFP · axial · 7.0mm · 1.25mm/px · 1 of 30 slices shown]
[im 1/30]
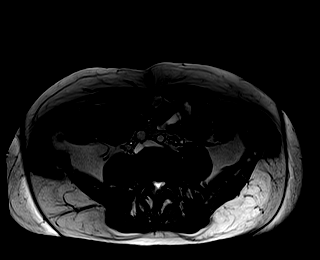

[Series 15: T1 dynamic · axial · 3.0mm · 1.25mm/px · z∈[-94,+143]mm · 3 of 80 slices shown (1 of 10)]
[im 1/80]
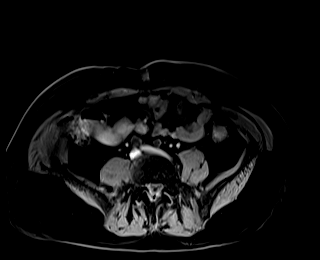
[im 40/80]
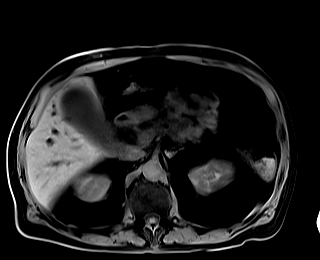
[im 80/80]
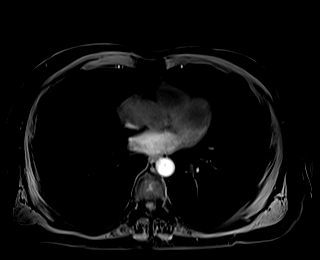

[Series 19: T1 dynamic · axial · 3.0mm · 1.25mm/px · z∈[-94,+143]mm · 3 of 80 slices shown (2 of 10)]
[im 1/80]
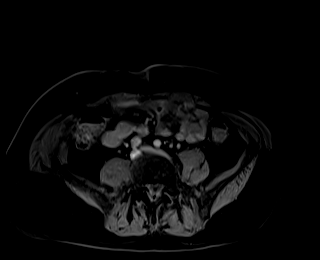
[im 40/80]
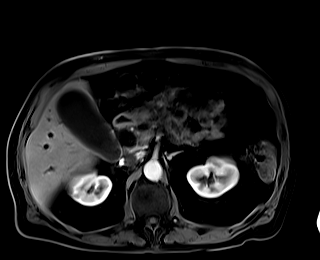
[im 80/80]
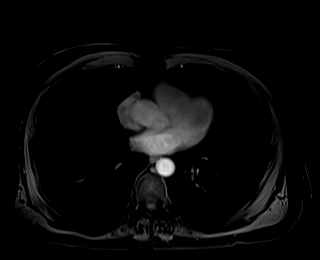

[Series 20: T1 dynamic · axial · 3.0mm · 1.25mm/px · z∈[-94,+143]mm · 3 of 80 slices shown (3 of 10)]
[im 1/80]
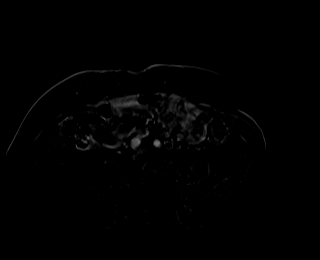
[im 40/80]
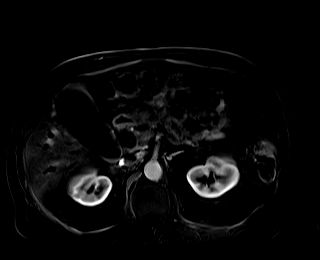
[im 80/80]
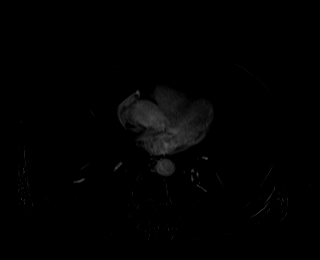

[Series 23: T1 dynamic · axial · 3.0mm · 1.25mm/px · z∈[-94,+143]mm · 3 of 80 slices shown (4 of 10)]
[im 1/80]
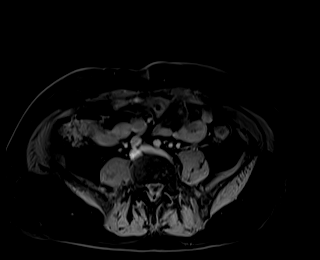
[im 40/80]
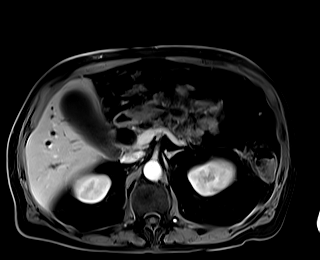
[im 80/80]
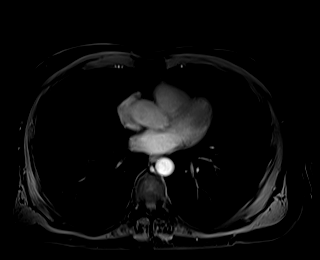

[Series 24: T1 dynamic · axial · 3.0mm · 1.25mm/px · z∈[-94,+143]mm · 3 of 80 slices shown (5 of 10)]
[im 1/80]
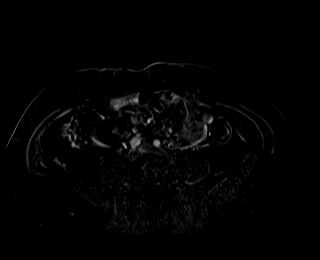
[im 40/80]
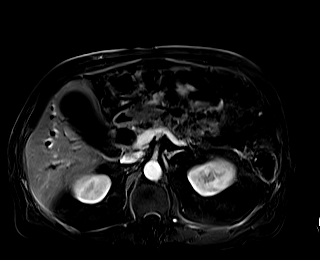
[im 80/80]
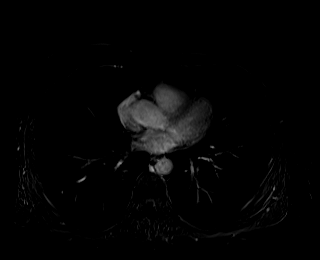

[Series 27: T1 dynamic · axial · 3.0mm · 1.25mm/px · z∈[-94,+143]mm · 3 of 80 slices shown (6 of 10)]
[im 1/80]
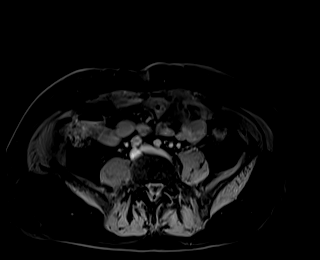
[im 40/80]
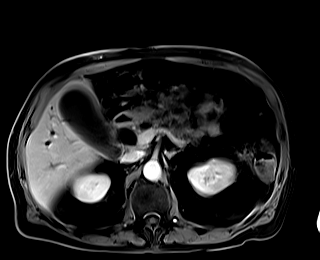
[im 80/80]
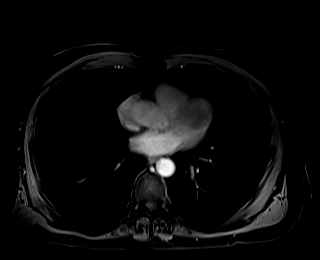

[Series 28: T1 dynamic · axial · 3.0mm · 1.25mm/px · z∈[-94,+143]mm · 3 of 80 slices shown (7 of 10)]
[im 1/80]
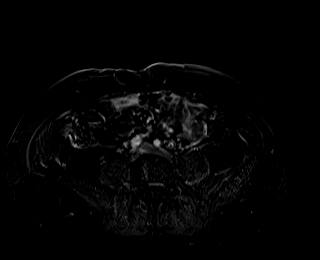
[im 40/80]
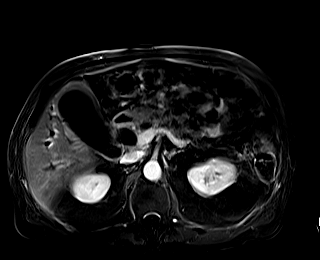
[im 80/80]
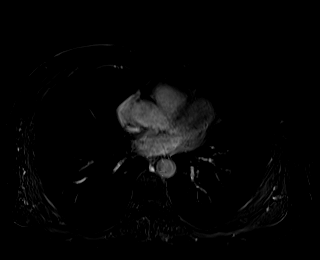

[Series 30: T1 dynamic · coronal · 5.0mm · 1.41mm/px · 2 of 56 slices shown (8 of 10)]
[im 1/56]
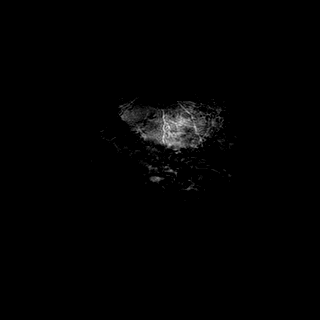
[im 56/56]
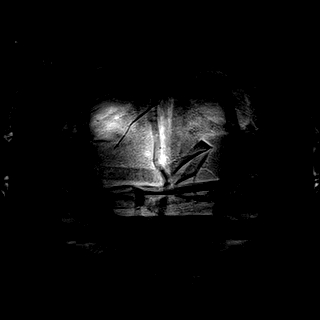

[Series 31: T2 · axial · 6.0mm · 1.56mm/px · 1 of 30 slices shown (2 of 2)]
[im 1/30]
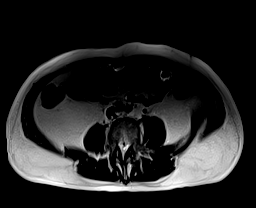

[Series 35: T1 dynamic · axial · 3.0mm · 1.25mm/px · z∈[-94,+143]mm · 3 of 80 slices shown (9 of 10)]
[im 1/80]
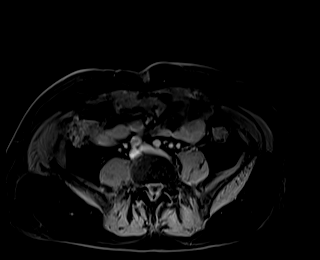
[im 40/80]
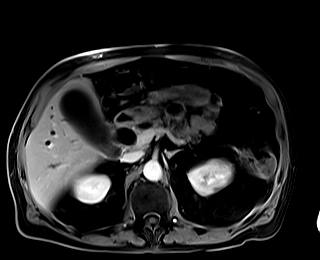
[im 80/80]
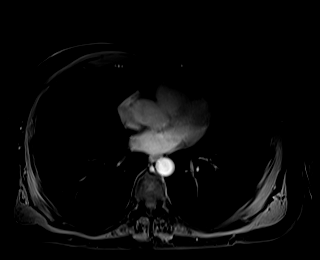

[Series 36: T1 dynamic · axial · 3.0mm · 1.25mm/px · z∈[-94,+143]mm · 3 of 80 slices shown (10 of 10)]
[im 1/80]
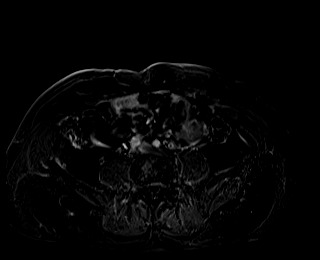
[im 40/80]
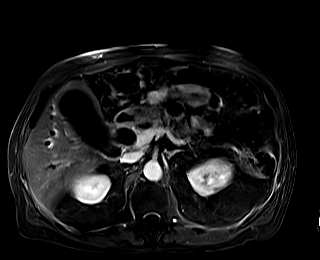
[im 80/80]
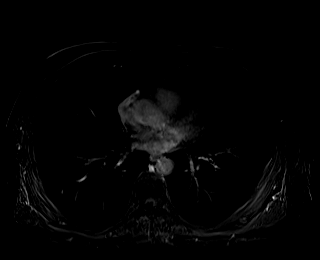

[45 of 48 positions shown; findings below may reference images not displayed]

FINDINGS: Lower chest: Normal heart size without pericardial or pleural
effusion.

Hepatobiliary: No suspicious liver lesion. The gallbladder is mildly
distended 10.3 cm with small gallstones within. No evidence of acute
cholecystitis.

Moderate intra and extrahepatic biliary duct dilatation. Common duct
1.7 cm on [DATE]. Followed to the level of the pancreatic head.

Pancreas: Pancreatic body and tail atrophy with duct dilatation
including up to 9 mm on [DATE]. Both ducts undergo an abrupt
transition in the region of the pancreatic head. A non border
deforming pancreatic head mass is subtly apparent at 3.2 x 2.6 cm on
52/23.

No superimposed acute pancreatitis.

Spleen:  Normal in size, without focal abnormality.

Adrenals/Urinary Tract: Normal adrenal glands. Tiny upper pole left
renal lesion is likely a cyst. Normal right kidney. No
hydronephrosis.

Stomach/Bowel: Normal stomach, without wall thickening. Normal
abdominal bowel loops.

Vascular/Lymphatic: Aortic atherosclerosis. No arterial involvement
by tumor. The SMV is adjacent to presumed tumor without encasement,
including on 51/23. Portal vein uninvolved.

No abdominal adenopathy. A left periaortic 7 mm node on [DATE] is not
pathologic by size criteria.

Other: No ascites. Subtle left-sided pericolonic nodule of 5 mm on
36/23.

Musculoskeletal: No acute osseous abnormality.
IMPRESSION: 1. Biliary and pancreatic duct dilatation to the level of ductal
obstruction in the pancreatic head, suspicious for a non border
deforming adenocarcinoma, as detailed above.
2. No evidence of hepatic metastasis or vascular encasement. The SMV
likely contacts tumor over a significantly less than 180 degree
span.
3. Minimal left-sided pericolonic nodularity is felt unlikely to
represent peritoneal isolated metastasis. Possibly the sequelae of
colonic diverticulosis or even a reactive node. Recommend attention
on follow-up.
4. Cholelithiasis.

## 2020-11-06 IMAGING — MR MR 3D RECON AT SCANNER
1 series · 1 of 1 positions shown · IV contrast (gadavist)
Comparison: None.

CLINICAL DATA: Weight loss. Outside CT demonstrating a pancreatic
abnormality. Epigastric discomfort.

EXAM:
MRI ABDOMEN WITHOUT AND WITH CONTRAST (INCLUDING MRCP)
TECHNIQUE: Multiplanar multisequence MR imaging of the abdomen was performed
both before and after the administration of intravenous contrast.
Heavily T2-weighted images of the biliary and pancreatic ducts were
obtained, and three-dimensional MRCP images were rendered by post
processing.
CONTRAST:  7mL GADAVIST GADOBUTROL 1 MMOL/ML IV SOLN

[Series 6: localizer haste bh · sagittal · 6.0mm · 1.56mm/px · 1 of 1 slices shown]
[im 1/1]
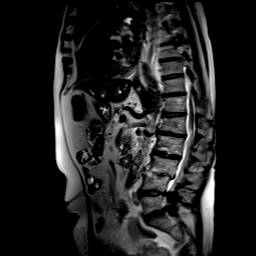

[1 of 1 positions shown; findings below may reference images not displayed]

FINDINGS: Lower chest: Normal heart size without pericardial or pleural
effusion.

Hepatobiliary: No suspicious liver lesion. The gallbladder is mildly
distended 10.3 cm with small gallstones within. No evidence of acute
cholecystitis.

Moderate intra and extrahepatic biliary duct dilatation. Common duct
1.7 cm on [DATE]. Followed to the level of the pancreatic head.

Pancreas: Pancreatic body and tail atrophy with duct dilatation
including up to 9 mm on [DATE]. Both ducts undergo an abrupt
transition in the region of the pancreatic head. A non border
deforming pancreatic head mass is subtly apparent at 3.2 x 2.6 cm on
52/23.

No superimposed acute pancreatitis.

Spleen:  Normal in size, without focal abnormality.

Adrenals/Urinary Tract: Normal adrenal glands. Tiny upper pole left
renal lesion is likely a cyst. Normal right kidney. No
hydronephrosis.

Stomach/Bowel: Normal stomach, without wall thickening. Normal
abdominal bowel loops.

Vascular/Lymphatic: Aortic atherosclerosis. No arterial involvement
by tumor. The SMV is adjacent to presumed tumor without encasement,
including on 51/23. Portal vein uninvolved.

No abdominal adenopathy. A left periaortic 7 mm node on [DATE] is not
pathologic by size criteria.

Other: No ascites. Subtle left-sided pericolonic nodule of 5 mm on
36/23.

Musculoskeletal: No acute osseous abnormality.
IMPRESSION: 1. Biliary and pancreatic duct dilatation to the level of ductal
obstruction in the pancreatic head, suspicious for a non border
deforming adenocarcinoma, as detailed above.
2. No evidence of hepatic metastasis or vascular encasement. The SMV
likely contacts tumor over a significantly less than 180 degree
span.
3. Minimal left-sided pericolonic nodularity is felt unlikely to
represent peritoneal isolated metastasis. Possibly the sequelae of
colonic diverticulosis or even a reactive node. Recommend attention
on follow-up.
4. Cholelithiasis.

## 2020-11-06 MED ORDER — GADOBUTROL 1 MMOL/ML IV SOLN
7.0000 mL | Freq: Once | INTRAVENOUS | Status: AC | PRN
  Administered 2020-11-06: 7 mL via INTRAVENOUS

## 2020-11-06 MED ORDER — HYDROXYZINE HCL 25 MG PO TABS: 25.0000 mg | ORAL_TABLET | Freq: Three times a day (TID) | ORAL | 0 refills | Status: DC | PRN

## 2020-11-06 NOTE — Telephone Encounter (Signed)
I have spoken with the patient. Here is the plan. Labs now. (He is on his way) MRI at 1 pm [facility] is calling to ask for a STAT read on it) He is on your schedule for 3:40 pm (there was a cancellation)  Do you want anything other than the fecal elastase and the Cmet?

## 2020-11-06 NOTE — Addendum Note (Signed)
Addended by: Curlene Labrum E on: 11/06/2020 11:54 AM   Modules accepted: Orders

## 2020-11-06 NOTE — Patient Instructions (Signed)
If you are age 71 or older, your body mass index should be between 23-30. Your Body mass index is 24.96 kg/m. If this is out of the aforementioned range listed, please consider follow up with your Primary Care Provider.  If you are age 66 or younger, your body mass index should be between 19-25. Your Body mass index is 24.96 kg/m. If this is out of the aformentioned range listed, please consider follow up with your Primary Care Provider.   __________________________________________________________  The [place] [facility] providers would like to encourage you to use [facility] to communicate with providers for non-urgent requests or questions.  Due to long hold times on the telephone, sending your provider a message by [facility] may be a faster and more efficient way to get a response.  Please allow 48 business hours for a response.  Please remember that this is for non-urgent requests.   We have sent the following medications to your pharmacy for you to pick up at your convenience: Atarax   It was a pleasure to see you today!  Vito Cirigliano, D.O.

## 2020-11-06 NOTE — Telephone Encounter (Signed)
Received a voicemail from Dr Shearon Stalls. He left the office number. I called the office number. Left a voicemail for the nurse. The nurse "Jenny Reichmann" returned my call and had to leave me a voicemail. I have called again. Had to leave a voicemail.  I asked her to give me some information on why the patient needs to be seen today when he comes to [facility] for the MRI or to leave a phone number that we can speak to someone when we call back.

## 2020-11-06 NOTE — Progress Notes (Signed)
Chief Complaint:    Jaundice, weight loss, review labs and imaging studies  HPI:     Patient is a 70 y.o. male presenting to the Gastroenterology Clinic for follow-up.   Evaluation to date notable for the following: - 07/10/2020: Initial evaluation in the GI clinic for evaluation of changes in bowel habits (increased stool frequency, not frank diarrhea) - 07/2020: Colonoscopy largely unremarkable as outlined below - 09/03/2020: Follow-up in GI clinic.  Endorsed resolution of change in bowel habits at that time, but started evaluation for 20 pound weight loss since April with associated fatigue, polydipsia, dry mouth, vague upper abdominal discomfort. - 09/03/2020: Labs notable for: Normal liver enzymes, CBC.  BUN/creatinine 22/1.33.  BG 731, NA 127. Ferritin 656, iron 96, sat 25.5%.  Was admitted to hospital in [place] and started on insulin - 08/2020: CT abdomen/pelvis during Hospital admission in [place]: Minimal stranding at pancreatic head, cholelithiasis without cholecystitis, moderate stool burden, diverticulosis - 09/2020: Established with PCP, Dr. Shearon Stalls in [place] - 10/20/2020: Follow-up in the GI clinic.  Continued weight loss, fatigue.  Thirst resolved, no nausea/vomiting.  Does report prior heavy EtOH use, cut way back 10-15 years ago - 10/20/2020: Normal CBC, lipase.  BUN/creatinine 31/1.87.  AST/ALT 281/252, ALP 269, T bili 0.6 - 11/06/2020: AST/ALT 79/83, ALP 447, T bili 14.2.  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  No vascular encasement.  The SMV likely contact tumor over significant less than 180 degree span.  CBD 1.7 cm with moderate intra/extrahepatic dilatation.  Normal liver/no hepatic metastasis.  Minimal left-sided pericolonic nodularity unlikely to represent peritoneal isolated metastasis.  He presents to the GI clinic  today for expedited review of labs and imaging study.  BG started increasing earlier this month on home checks, requiring SSI. More fatigued now. +itching, which keeps him awake at night. Dark urine. Down 35# now.  Was seen by his PCM yesterday with labs and MRI completed earlier today as outlined above.  He presents with his wife.   Endoscopic History: - Colonoscopy (07/2020): Diverticulosis, 8 mm tubular adenoma.  Biopsies negative for MC.  Recommended repeat 7 years    Review of systems:     No chest pain, no SOB, no fevers, no urinary sx   Past Medical History:  Diagnosis Date   Back pain    Hearing deficit    Hyperlipemia    Hypertension    Insomnia    Sleep apnea     Patient's surgical history, family medical history, social history, medications and allergies were all reviewed in Epic    Current Outpatient Medications  Medication Sig Dispense Refill   amLODipine (NORVASC) 5 MG tablet Take 5 mg by mouth daily.     atenolol (TENORMIN) 50 MG tablet Take 50 mg by mouth daily.     clopidogrel (PLAVIX) 75 MG tablet Take 75 mg by mouth daily.     dicyclomine (BENTYL) 10 MG capsule Take 1 capsule (10 mg total) by mouth as needed for spasms. 10 mg po q 6 hours prn 90 capsule 1   Insulin Glargine (BASAGLAR KWIKPEN) 100 UNIT/ML Inject into the skin.     NOVOLOG FLEXPEN 100 UNIT/ML FlexPen SMARTSIG:10 Unit(s) SUB-Q 3 Times Daily     traMADol (ULTRAM) 50 MG tablet Take 50 mg by mouth every 4 (four) hours as needed.  No current facility-administered medications for this visit.    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  ALP uptrending, but AST/ALT improved from earlier this month.  Discussed his labs and MRI finding at length today, which is certainly highly concerning for 3.2 x 2.6 cm lesion in the HOP with pancreatic and CBD obstruction.  We discussed DDx and plan at length, with plan as below:  - Check CA 19 9  - I discussed his case with the advanced biliary service.  We will coordinate optimal timing for direct admission to [facility] for decompressive ERCP with determination of timing of EUS per advanced biliary staff.  - Atarax 25 mg. Start qhs, then can increase to prn Q8 as needed for daytime itching.   8) Diabetes - New onset, likely related to above - Continue management per PCP  I spent 50 minutes of time, including in depth chart review, independent review of results as outlined above, communicating results with the patient directly, face-to-face time with the patient, coordinating care, ordering studies and medications as appropriate, and documentation.            Lavena Bullion ,DO, FACG 11/06/2020, 3:48 PM

## 2020-11-06 NOTE — Telephone Encounter (Signed)
Dr Bryan Lemma I called the patient to find out what has happened. He tells me that when he saw Dr Shearon Stalls yesterday, the PCP was very concerned about the jaundicing of the patient, telling him "if you were not going to [facility] for the MRI tomorrow, I would put you in the hospital." Dr Shearon Stalls instructed the patient to stop by our office for you to look at him.  The patient reports yellowing of the eyes, his palms are yellow and he has intense itching and fatigue. He is coming for Cmet and pancreatic fecal elastase before the MRI which is at 1:00 pm; Nicoletta Ba, PA is out of office today. What should I do?

## 2020-11-07 ENCOUNTER — Encounter (HOSPITAL_COMMUNITY): Payer: Self-pay | Admitting: Gastroenterology

## 2020-11-07 ENCOUNTER — Other Ambulatory Visit: Payer: Self-pay

## 2020-11-07 DIAGNOSIS — K869 Disease of pancreas, unspecified: Secondary | ICD-10-CM

## 2020-11-07 DIAGNOSIS — R748 Abnormal levels of other serum enzymes: Secondary | ICD-10-CM

## 2020-11-07 DIAGNOSIS — R17 Unspecified jaundice: Secondary | ICD-10-CM

## 2020-11-07 DIAGNOSIS — R634 Abnormal weight loss: Secondary | ICD-10-CM

## 2020-11-07 DIAGNOSIS — R7401 Elevation of levels of liver transaminase levels: Secondary | ICD-10-CM

## 2020-11-07 DIAGNOSIS — K831 Obstruction of bile duct: Secondary | ICD-10-CM

## 2020-11-07 DIAGNOSIS — R933 Abnormal findings on diagnostic imaging of other parts of digestive tract: Secondary | ICD-10-CM

## 2020-11-07 NOTE — Addendum Note (Signed)
Addended by: Curlene Labrum E on: 11/07/2020 08:35 AM   Modules accepted: Miquel Dunn

## 2020-11-07 NOTE — Progress Notes (Signed)
Attempted to obtain medical history via telephone, unable to reach at this time. I left a voicemail to return pre surgical testing department's phone call.  

## 2020-11-09 ENCOUNTER — Telehealth: Payer: Self-pay | Admitting: Family Medicine

## 2020-11-09 ENCOUNTER — Other Ambulatory Visit: Payer: Self-pay

## 2020-11-09 ENCOUNTER — Inpatient Hospital Stay (HOSPITAL_COMMUNITY)
Admission: AD | Admit: 2020-11-09 | Discharge: 2020-11-13 | DRG: 435 | Disposition: A | Discharge status: Home / Self Care | Payer: Medicare Other | Attending: Internal Medicine | Admitting: Internal Medicine

## 2020-11-09 ENCOUNTER — Encounter (HOSPITAL_COMMUNITY): Payer: Self-pay | Admitting: Internal Medicine

## 2020-11-09 DIAGNOSIS — R748 Abnormal levels of other serum enzymes: Secondary | ICD-10-CM

## 2020-11-09 DIAGNOSIS — I10 Essential (primary) hypertension: Secondary | ICD-10-CM

## 2020-11-09 DIAGNOSIS — R933 Abnormal findings on diagnostic imaging of other parts of digestive tract: Secondary | ICD-10-CM

## 2020-11-09 DIAGNOSIS — L298 Other pruritus: Secondary | ICD-10-CM | POA: Diagnosis present

## 2020-11-09 DIAGNOSIS — K869 Disease of pancreas, unspecified: Secondary | ICD-10-CM

## 2020-11-09 DIAGNOSIS — C250 Malignant neoplasm of head of pancreas: Principal | ICD-10-CM | POA: Diagnosis present

## 2020-11-09 DIAGNOSIS — K831 Obstruction of bile duct: Secondary | ICD-10-CM | POA: Diagnosis present

## 2020-11-09 DIAGNOSIS — E1165 Type 2 diabetes mellitus with hyperglycemia: Secondary | ICD-10-CM

## 2020-11-09 DIAGNOSIS — E876 Hypokalemia: Secondary | ICD-10-CM | POA: Diagnosis present

## 2020-11-09 DIAGNOSIS — F17290 Nicotine dependence, other tobacco product, uncomplicated: Secondary | ICD-10-CM | POA: Diagnosis present

## 2020-11-09 DIAGNOSIS — K8689 Other specified diseases of pancreas: Secondary | ICD-10-CM

## 2020-11-09 DIAGNOSIS — Z800 Family history of malignant neoplasm of digestive organs: Secondary | ICD-10-CM

## 2020-11-09 DIAGNOSIS — E785 Hyperlipidemia, unspecified: Secondary | ICD-10-CM | POA: Diagnosis present

## 2020-11-09 DIAGNOSIS — Z955 Presence of coronary angioplasty implant and graft: Secondary | ICD-10-CM

## 2020-11-09 DIAGNOSIS — Z811 Family history of alcohol abuse and dependence: Secondary | ICD-10-CM

## 2020-11-09 DIAGNOSIS — Z20822 Contact with and (suspected) exposure to covid-19: Secondary | ICD-10-CM | POA: Diagnosis present

## 2020-11-09 DIAGNOSIS — R7401 Elevation of levels of liver transaminase levels: Secondary | ICD-10-CM

## 2020-11-09 DIAGNOSIS — R634 Abnormal weight loss: Secondary | ICD-10-CM

## 2020-11-09 DIAGNOSIS — Z794 Long term (current) use of insulin: Secondary | ICD-10-CM

## 2020-11-09 DIAGNOSIS — K2970 Gastritis, unspecified, without bleeding: Secondary | ICD-10-CM | POA: Diagnosis present

## 2020-11-09 DIAGNOSIS — I2510 Atherosclerotic heart disease of native coronary artery without angina pectoris: Secondary | ICD-10-CM | POA: Diagnosis present

## 2020-11-09 DIAGNOSIS — Z7902 Long term (current) use of antithrombotics/antiplatelets: Secondary | ICD-10-CM

## 2020-11-09 DIAGNOSIS — E119 Type 2 diabetes mellitus without complications: Secondary | ICD-10-CM | POA: Diagnosis present

## 2020-11-09 DIAGNOSIS — K449 Diaphragmatic hernia without obstruction or gangrene: Secondary | ICD-10-CM | POA: Diagnosis present

## 2020-11-09 DIAGNOSIS — D638 Anemia in other chronic diseases classified elsewhere: Secondary | ICD-10-CM | POA: Diagnosis present

## 2020-11-09 DIAGNOSIS — Z8249 Family history of ischemic heart disease and other diseases of the circulatory system: Secondary | ICD-10-CM

## 2020-11-09 DIAGNOSIS — R17 Unspecified jaundice: Secondary | ICD-10-CM

## 2020-11-09 LAB — CBC WITH DIFFERENTIAL/PLATELET
Abs Immature Granulocytes: 0.04 10*3/uL (ref 0.00–0.07)
Basophils Absolute: 0.1 10*3/uL (ref 0.0–0.1)
Basophils Relative: 1 %
Eosinophils Absolute: 0.2 10*3/uL (ref 0.0–0.5)
Eosinophils Relative: 3 %
HCT: 36.4 % — ABNORMAL LOW (ref 39.0–52.0)
Hemoglobin: 12.8 g/dL — ABNORMAL LOW (ref 13.0–17.0)
Immature Granulocytes: 1 %
Lymphocytes Relative: 28 %
Lymphs Abs: 2.3 10*3/uL (ref 0.7–4.0)
MCH: 30.3 pg (ref 26.0–34.0)
MCHC: 35.2 g/dL (ref 30.0–36.0)
MCV: 86.1 fL (ref 80.0–100.0)
Monocytes Absolute: 0.7 10*3/uL (ref 0.1–1.0)
Monocytes Relative: 9 %
Neutro Abs: 4.7 10*3/uL (ref 1.7–7.7)
Neutrophils Relative %: 58 %
Platelets: 292 10*3/uL (ref 150–400)
RBC: 4.23 MIL/uL (ref 4.22–5.81)
RDW: 19.4 % — ABNORMAL HIGH (ref 11.5–15.5)
WBC: 8.1 10*3/uL (ref 4.0–10.5)
nRBC: 0 % (ref 0.0–0.2)

## 2020-11-09 LAB — COMPREHENSIVE METABOLIC PANEL
ALT: 75 U/L — ABNORMAL HIGH (ref 0–44)
AST: 74 U/L — ABNORMAL HIGH (ref 15–41)
Albumin: 3.7 g/dL (ref 3.5–5.0)
Alkaline Phosphatase: 389 U/L — ABNORMAL HIGH (ref 38–126)
Anion gap: 10 (ref 5–15)
BUN: 20 mg/dL (ref 8–23)
CO2: 23 mmol/L (ref 22–32)
Calcium: 9.8 mg/dL (ref 8.9–10.3)
Chloride: 101 mmol/L (ref 98–111)
Creatinine, Ser: 1.04 mg/dL (ref 0.61–1.24)
GFR, Estimated: >60 mL/min (ref >60)
Glucose, Bld: 285 mg/dL — ABNORMAL HIGH (ref 70–99)
Potassium: 3.9 mmol/L (ref 3.5–5.1)
Sodium: 134 mmol/L — ABNORMAL LOW (ref 135–145)
Total Bilirubin: 16.4 mg/dL — ABNORMAL HIGH (ref 0.3–1.2)
Total Protein: 7.5 g/dL (ref 6.5–8.1)

## 2020-11-09 LAB — MAGNESIUM: Magnesium: 2 mg/dL (ref 1.7–2.4)

## 2020-11-09 LAB — GLUCOSE, CAPILLARY
Glucose-Capillary: 263 mg/dL — ABNORMAL HIGH (ref 70–99)
Glucose-Capillary: 250 mg/dL — ABNORMAL HIGH (ref 70–99)

## 2020-11-09 LAB — HEMOGLOBIN A1C
Hgb A1c MFr Bld: 9.6 % — ABNORMAL HIGH (ref 4.8–5.6)
Mean Plasma Glucose: 228.82 mg/dL

## 2020-11-09 MED ORDER — LACTATED RINGERS IV SOLN: INTRAVENOUS | Status: DC

## 2020-11-09 MED ORDER — ONDANSETRON HCL 4 MG/2ML IJ SOLN: 4.0000 mg | Freq: Four times a day (QID) | INTRAMUSCULAR | Status: DC | PRN

## 2020-11-09 MED ORDER — SERTRALINE HCL 100 MG PO TABS
100.0000 mg | ORAL_TABLET | Freq: Every day | ORAL | Status: DC
  Administered 2020-11-09 – 2020-11-13 (×5): 100 mg via ORAL
  Filled 2020-11-09 (×5): qty 1

## 2020-11-09 MED ORDER — BASAGLAR KWIKPEN 100 UNIT/ML ~~LOC~~ SOPN: 6.0000 [IU] | PEN_INJECTOR | Freq: Every day | SUBCUTANEOUS | Status: DC

## 2020-11-09 MED ORDER — DIPHENHYDRAMINE HCL 50 MG/ML IJ SOLN
25.0000 mg | Freq: Four times a day (QID) | INTRAMUSCULAR | Status: DC | PRN
  Administered 2020-11-09 – 2020-11-13 (×4): 25 mg via INTRAVENOUS
  Filled 2020-11-09 (×4): qty 1

## 2020-11-09 MED ORDER — SODIUM CHLORIDE 0.9% FLUSH
3.0000 mL | Freq: Two times a day (BID) | INTRAVENOUS | Status: DC
  Administered 2020-11-09 – 2020-11-13 (×7): 3 mL via INTRAVENOUS

## 2020-11-09 MED ORDER — AMLODIPINE BESYLATE 5 MG PO TABS
5.0000 mg | ORAL_TABLET | Freq: Every day | ORAL | Status: DC
  Administered 2020-11-11 – 2020-11-13 (×3): 5 mg via ORAL
  Filled 2020-11-09 (×5): qty 1

## 2020-11-09 MED ORDER — INSULIN ASPART 100 UNIT/ML IJ SOLN
0.0000 [IU] | Freq: Three times a day (TID) | INTRAMUSCULAR | Status: DC
  Administered 2020-11-09: 5 [IU] via SUBCUTANEOUS
  Administered 2020-11-10: 2 [IU] via SUBCUTANEOUS
  Administered 2020-11-10: 1 [IU] via SUBCUTANEOUS
  Administered 2020-11-10 – 2020-11-11 (×2): 2 [IU] via SUBCUTANEOUS
  Administered 2020-11-11: 3 [IU] via SUBCUTANEOUS
  Administered 2020-11-12 (×2): 2 [IU] via SUBCUTANEOUS
  Administered 2020-11-12: 1 [IU] via SUBCUTANEOUS

## 2020-11-09 MED ORDER — INSULIN ASPART 100 UNIT/ML IJ SOLN: 0.0000 [IU] | Freq: Three times a day (TID) | INTRAMUSCULAR | Status: DC

## 2020-11-09 MED ORDER — ACETAMINOPHEN 325 MG PO TABS: 650.0000 mg | ORAL_TABLET | Freq: Four times a day (QID) | ORAL | Status: DC | PRN

## 2020-11-09 MED ORDER — SODIUM CHLORIDE 0.9% FLUSH: 3.0000 mL | INTRAVENOUS | Status: DC | PRN

## 2020-11-09 MED ORDER — TRAMADOL HCL 50 MG PO TABS
50.0000 mg | ORAL_TABLET | ORAL | Status: DC | PRN
  Administered 2020-11-10 – 2020-11-12 (×2): 50 mg via ORAL
  Filled 2020-11-09 (×2): qty 1

## 2020-11-09 MED ORDER — SODIUM CHLORIDE 0.9 % IV SOLN
250.0000 mL | INTRAVENOUS | Status: DC | PRN
Start: 2020-11-09 — End: 2020-11-10

## 2020-11-09 MED ORDER — ACETAMINOPHEN 650 MG RE SUPP: 650.0000 mg | Freq: Four times a day (QID) | RECTAL | Status: DC | PRN

## 2020-11-09 MED ORDER — INSULIN ASPART 100 UNIT/ML IJ SOLN
0.0000 [IU] | Freq: Every day | INTRAMUSCULAR | Status: DC
  Administered 2020-11-09 – 2020-11-12 (×3): 2 [IU] via SUBCUTANEOUS

## 2020-11-09 MED ORDER — ONDANSETRON HCL 4 MG PO TABS: 4.0000 mg | ORAL_TABLET | Freq: Four times a day (QID) | ORAL | Status: DC | PRN

## 2020-11-09 MED ORDER — INSULIN GLARGINE-YFGN 100 UNIT/ML ~~LOC~~ SOLN
6.0000 [IU] | Freq: Every day | SUBCUTANEOUS | Status: DC
  Administered 2020-11-09 – 2020-11-12 (×4): 6 [IU] via SUBCUTANEOUS
  Filled 2020-11-09 (×5): qty 0.06

## 2020-11-09 NOTE — Subjective & Objective (Signed)
CC: pancreatic mass HPI: 70 year old male with a prior history of hypertension presents to [facility] long hospital as a direct admission.  Patient recently evaluated by GI and found to have a pancreatic mass.  Suspicion of neoplasm.  Patient being admitted directly to undergo ERCP with possible endoscopic ultrasound and biopsy tomorrow.  Patient states that he started noticing changes in his bowel habits in February 2022.  This progressed to voluminous amounts of floating stools in the toilet.  Patient underwent colonoscopy in May 2022.  This was negative.  Patient also had rising liver enzymes and alkaline phosphatase.  He also noted to start having new onset diabetes with this random blood sugar of greater than 700.  He was started on insulin.  MRI performed later on demonstrated a pancreatic mass.  He was seen by his GI physician who advocated for ERCP and biopsy.  Patient states that his itching of his skin has been exhausting.  He has not had a good night sleep in nearly a week.  Patient was prescribed Atarax which is not helping with his itching.  Appetite has not changed.  He continues to lose weight.  He is lost a total of 20-30 pounds.  Patient states that he works for [employer] in [place] majority of his working career.  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  See the timeline written by GI. - 07/10/2020: Initial evaluation in the GI clinic for evaluation of changes in bowel habits (increased stool frequency, not frank diarrhea) - 07/2020: Colonoscopy largely unremarkable as outlined below - 09/03/2020: Follow-up in GI clinic.  Endorsed resolution of change in bowel habits at that time, but started evaluation for 20 pound weight loss since April with associated fatigue, polydipsia, dry mouth, vague upper abdominal discomfort. - 09/03/2020: Labs notable for: Normal liver enzymes,  CBC.  BUN/creatinine 22/1.33.  BG 731, NA 127. Ferritin 656, iron 96, sat 25.5%.  Was admitted to hospital in [place] and started on insulin - 08/2020: CT abdomen/pelvis during Hospital admission in [place]: Minimal stranding at pancreatic head, cholelithiasis without cholecystitis, moderate stool burden, diverticulosis - 09/2020: Established with PCP, Dr. Shearon Stalls in [place] - 10/20/2020: Follow-up in the GI clinic.  Continued weight loss, fatigue.  Thirst resolved, no nausea/vomiting.  Does report prior heavy EtOH use, cut way back 10-15 years ago - 10/20/2020: Normal CBC, lipase.  BUN/creatinine 31/1.87.  AST/ALT 281/252, ALP 269, T bili 0.6 - 11/06/2020: AST/ALT 79/83, ALP 447, T bili 14.2.  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  No vascular encasement.  The SMV likely contact tumor over significant less than 180 degree span.  CBD 1.7 cm with moderate intra/extrahepatic dilatation.  Normal liver/no hepatic metastasis.  Minimal left-sided pericolonic nodularity unlikely to represent peritoneal isolated metastasis.

## 2020-11-09 NOTE — Assessment & Plan Note (Signed)
Start Zoloft 100 mg daily for cholestatic itching. Pt start atarax not effective.

## 2020-11-09 NOTE — Assessment & Plan Note (Signed)
Continue lantus and SSI.

## 2020-11-09 NOTE — Anesthesia Preprocedure Evaluation (Deleted)
Anesthesia Evaluation    Reviewed: Allergy & Precautions, Patient's Chart, lab work & pertinent test results  Airway        Dental   Pulmonary sleep apnea ,           Cardiovascular hypertension, Pt. on medications      Neuro/Psych negative neurological ROS     GI/Hepatic negative GI ROS, Lab Results      Component                Value               Date                      ALT                      75 (H)              11/09/2020                AST                      74 (H)              11/09/2020                ALKPHOS                  389 (H)             11/09/2020                BILITOT                  16.4 (H)            11/09/2020              Endo/Other  diabetes, Insulin Dependent  Renal/GU negative Renal ROSLab Results      Component                Value               Date                      CREATININE               1.04                11/09/2020                BUN                      20                  11/09/2020                NA                       134 (L)             11/09/2020                K                        3.9                 11/09/2020  CL                       101                 11/09/2020                CO2                      23                  11/09/2020                Musculoskeletal negative musculoskeletal ROS (+)   Abdominal   Peds  Hematology Lab Results      Component                Value               Date                      WBC                      8.1                 11/09/2020                HGB                      12.8 (L)            11/09/2020                HCT                      36.4 (L)            11/09/2020                MCV                      86.1                11/09/2020                PLT                      292                 11/09/2020              Anesthesia Other Findings Icteric scera  Reproductive/Obstetrics                              Anesthesia Physical Anesthesia Plan  ASA: 3  Anesthesia Plan: General   Post-op Pain Management:    Induction: Intravenous  PONV Risk Score and Plan: Treatment may vary due to age or medical condition  Airway Management Planned: Oral ETT  Additional Equipment: None  Intra-op Plan:   Post-operative Plan: Extubation in OR  Informed Consent:     Dental advisory given  Plan Discussed with:   Anesthesia Plan Comments: (Common bile duct obstruction for ERCP)        Anesthesia Quick Evaluation

## 2020-11-09 NOTE — Assessment & Plan Note (Addendum)
Admit to medical bed. GI staff aware of pt's admission. Plan for ERCP +/- EUS with biopsy tomorrow. Pt last dose of plavix was on 11-07-2020. SCDs for DVT prophylaxis. NPO after MN. IVF to start after MN. Pt is aware of the high probability that mass is neoplastic

## 2020-11-09 NOTE — Assessment & Plan Note (Signed)
Continue norvasc 

## 2020-11-09 NOTE — Telephone Encounter (Signed)
Direct admission request for decompressive ERCP 8/22 to address obstructive jaundice Requesting physician: Dr. Bryan Lemma  70 year old man seen by Dr. Bryan Lemma 8/18 for jaundice, pruritus, found to have mass in the head of the pancreas, marked jaundice with total bilirubin of 14.2.  Imaging concerning for mass in the pancreatic head and CBD obstruction.    Dr. Bryan Lemma discussed with advanced biliary service.  Plan for decompressive ERCP 8/21 with determination of timing of EUS per advanced biliary staff  Dr. Bryan Lemma and I discussed the case 8/19, given the patient's reported clinical stability per him, there was no indication for hospitalization that day, but request was made for direct admission on Sunday 8/21.  I have discussed with the patient this morning by telephone, and he reports no concerning signs or symptoms to suggest instability.  Therefore we will proceed with direct admission to medical bed.  See Dr. Vivia Ewing note from 8/18 for further details.  Assessment/plan 1) Mass in head of pancreas 2) Jaundice 3) Pruritus 4) Elevated liver enzymes-downtrending 5) Elevated alkaline phosphatase 6) Weight loss 7) Fatigue  Admit for ERCP 8/22  Murray Hodgkins, MD [facility]

## 2020-11-09 NOTE — Assessment & Plan Note (Signed)
Likely due to pancreatic obstruction.

## 2020-11-09 NOTE — H&P (Signed)
History and Physical    Francisco Frank [id number] DOB: [birth year] DOA: 11/09/2020  PCP: Kennieth Rad, MD   Patient coming from: Home  I have personally briefly reviewed patient's old medical records in [employer]  CC: pancreatic mass HPI: 70 year old male with a prior history of hypertension presents to [facility] hospital as a direct admission.  Patient recently evaluated by GI and found to have a pancreatic mass.  Suspicion of neoplasm.  Patient being admitted directly to undergo ERCP with possible endoscopic ultrasound and biopsy tomorrow.  Patient states that he started noticing changes in his bowel habits in February 2022.  This progressed to voluminous amounts of floating stools in the toilet.  Patient underwent colonoscopy in May 2022.  This was negative.  Patient also had rising liver enzymes and alkaline phosphatase.  He also noted to start having new onset diabetes with this random blood sugar of greater than 700.  He was started on insulin.  MRI performed later on demonstrated a pancreatic mass.  He was seen by his GI physician who advocated for ERCP and biopsy.  Patient states that his itching of his skin has been exhausting.  He has not had a good night sleep in nearly a week.  Patient was prescribed Atarax which is not helping with his itching.  Appetite has not changed.  He continues to lose weight.  He is lost a total of 20-30 pounds.  Patient states that he works for [employer] in [place] majority of his working career.  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  See the timeline written by GI. - 07/10/2020: Initial evaluation in the GI clinic for evaluation of changes in bowel habits (increased stool frequency, not frank diarrhea) - 07/2020: Colonoscopy largely unremarkable as outlined below - 09/03/2020: Follow-up in GI clinic.  Endorsed resolution  of change in bowel habits at that time, but started evaluation for 20 pound weight loss since April with associated fatigue, polydipsia, dry mouth, vague upper abdominal discomfort. - 09/03/2020: Labs notable for: Normal liver enzymes, CBC.  BUN/creatinine 22/1.33.  BG 731, NA 127. Ferritin 656, iron 96, sat 25.5%.  Was admitted to hospital in [place] and started on insulin - 08/2020: CT abdomen/pelvis during Hospital admission in [place]: Minimal stranding at pancreatic head, cholelithiasis without cholecystitis, moderate stool burden, diverticulosis - 09/2020: Established with PCP, Dr. Shearon Stalls in [place] - 10/20/2020: Follow-up in the GI clinic.  Continued weight loss, fatigue.  Thirst resolved, no nausea/vomiting.  Does report prior heavy EtOH use, cut way back 10-15 years ago - 10/20/2020: Normal CBC, lipase.  BUN/creatinine 31/1.87.  AST/ALT 281/252, ALP 269, T bili 0.6 - 11/06/2020: AST/ALT 79/83, ALP 447, T bili 14.2.  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  No vascular encasement.  The SMV likely contact tumor over significant less than 180 degree span.  CBD 1.7 cm with moderate intra/extrahepatic dilatation.  Normal liver/no hepatic metastasis.  Minimal left-sided pericolonic nodularity unlikely to represent peritoneal isolated metastasis.     Review of Systems:  Review of Systems  Constitutional:  Positive for malaise/fatigue and weight loss.  HENT: Negative.    Eyes: Negative.   Respiratory: Negative.    Cardiovascular: Negative.   Gastrointestinal:  Positive  for abdominal pain and diarrhea.       Abd cramping. Assoc with voluminous stools. Sometimes greasy stools.  Genitourinary: Negative.   Musculoskeletal: Negative.   Skin:  Positive for itching.       Jaundice of his skin for several weeks  Neurological: Negative.    Endo/Heme/Allergies: Negative.   Psychiatric/Behavioral: Negative.    All other systems reviewed and are negative.  Past Medical History:  Diagnosis Date   Back pain    Diabetes mellitus without complication ([place])    Hearing deficit    Hyperlipemia    Hypertension    Insomnia    Sleep apnea     Past Surgical History:  Procedure Laterality Date   CARDIAC CATHETERIZATION     REPLACEMENT TOTAL KNEE     ROTATOR CUFF REPAIR       reports that he has never smoked. His smokeless tobacco use includes snuff. He reports current alcohol use. He reports that he does not use drugs.  No Known Allergies  Family History  Problem Relation Age of Onset   Cirrhosis Father    Alcohol abuse Father    Colon cancer Maternal Grandmother    Colonic polyp Maternal Grandmother    Heart disease Paternal Grandfather    Esophageal cancer Neg Hx    Pancreatic cancer Neg Hx    Stomach cancer Neg Hx     Prior to Admission medications   Medication Sig Start Date End Date Taking? Authorizing Provider  amLODipine (NORVASC) 5 MG tablet Take 5 mg by mouth daily. 10/22/20   [provider]  atenolol (TENORMIN) 50 MG tablet Take 50 mg by mouth daily.    [provider]  clopidogrel (PLAVIX) 75 MG tablet Take 75 mg by mouth daily.    [provider]  dicyclomine (BENTYL) 10 MG capsule Take 1 capsule (10 mg total) by mouth as needed for spasms. 10 mg po q 6 hours prn 08/06/20   Cirigliano, Vito V, DO  hydrOXYzine (ATARAX/VISTARIL) 25 MG tablet Take 1 tablet (25 mg total) by mouth every 8 (eight) hours as needed. 11/06/20   Cirigliano, Vito V, DO  Insulin Glargine (BASAGLAR KWIKPEN) 100 UNIT/ML Inject into the skin. 09/27/20   [provider]  NOVOLOG FLEXPEN 100 UNIT/ML FlexPen SMARTSIG:10 Unit(s) SUB-Q 3 Times Daily 09/18/20   [provider]  traMADol (ULTRAM) 50 MG tablet Take 50 mg by mouth every 4 (four) hours as needed. 06/11/20   [provider]     Physical Exam: Vitals:   11/09/20 1434  BP: 133/88  Pulse: 61  Resp: 18  Temp: 97.9 F (36.6 C)  TempSrc: Oral  SpO2: 98%    Physical Exam Vitals and nursing note reviewed.  Constitutional:      General: He is not in acute distress.    Appearance: He is not ill-appearing, toxic-appearing or diaphoretic.  HENT:     Head: Normocephalic and atraumatic.  Eyes:     General: Scleral icterus present.  Cardiovascular:     Rate and Rhythm: Normal rate and regular rhythm.     Pulses: Normal pulses.  Pulmonary:     Effort: Pulmonary effort is normal.     Breath sounds: Normal breath sounds.  Abdominal:     General: Abdomen is flat. Bowel sounds are normal. There is no distension.     Tenderness: There is no abdominal tenderness. There is no rebound.  Musculoskeletal:     Right lower leg: No edema.  Left lower leg: No edema.  Skin:    General: Skin is warm and dry.     Capillary Refill: Capillary refill takes less than 2 seconds.     Coloration: Skin is jaundiced.  Neurological:     General: No focal deficit present.     Mental Status: He is alert and oriented to person, place, and time.     Labs on Admission: I have personally reviewed following labs and imaging studies  CBC: Recent Labs  Lab 11/09/20 1600  WBC PENDING  NEUTROABS PENDING  HGB 12.8*  HCT 36.4*  MCV 86.1  PLT 123456   Basic Metabolic Panel: Recent Labs  Lab 11/06/20 1156  NA 136  K 4.0  CL 101  CO2 24  GLUCOSE 252*  BUN 23  CREATININE 1.41  CALCIUM 9.8   GFR: Estimated Creatinine Clearance: 48.1 mL/min (by C-G formula based on SCr of 1.41 mg/dL). Liver Function Tests: Recent Labs  Lab 11/06/20 1156  AST 79*  ALT 83*  ALKPHOS 447*  BILITOT 14.2*  PROT 7.0  ALBUMIN 3.9   No results for input(s): LIPASE, AMYLASE in the last 168 hours. No results for input(s): AMMONIA in the last 168 hours. Coagulation Profile: No results for input(s): INR, PROTIME in the last 168  hours. Cardiac Enzymes: No results for input(s): CKTOTAL, CKMB, CKMBINDEX, TROPONINI in the last 168 hours. BNP (last 3 results) No results for input(s): PROBNP in the last 8760 hours. HbA1C: No results for input(s): HGBA1C in the last 72 hours. CBG: No results for input(s): GLUCAP in the last 168 hours. Lipid Profile: No results for input(s): CHOL, HDL, LDLCALC, TRIG, CHOLHDL, LDLDIRECT in the last 72 hours. Thyroid Function Tests: No results for input(s): TSH, T4TOTAL, FREET4, T3FREE, THYROIDAB in the last 72 hours. Anemia Panel: No results for input(s): VITAMINB12, FOLATE, FERRITIN, TIBC, IRON, RETICCTPCT in the last 72 hours. Urine analysis: No results found for: COLORURINE, APPEARANCEUR, LABSPEC, PHURINE, GLUCOSEU, [place], BILIRUBINUR, KETONESUR, PROTEINUR, UROBILINOGEN, NITRITE, LEUKOCYTESUR  Radiological Exams on Admission: I have personally reviewed images No results found.  EKG: I have personally reviewed EKG: no ekg  Assessment/Plan Principal Problem:   Pancreatic mass Active Problems:   Cholestatic pruritus   Hyperbilirubinemia   HTN (hypertension)   Type 2 diabetes mellitus without complication, with long-term current use of insulin ([place])    Pancreatic mass Admit to medical bed. GI staff aware of pt's admission. Plan for ERCP +/- EUS with biopsy tomorrow. Pt last dose of plavix was on 11-07-2020. SCDs for DVT prophylaxis. NPO after MN. IVF to start after MN. Pt is aware of the high probability that mass is neoplastic  Cholestatic pruritus Start Zoloft 100 mg daily for cholestatic itching. Pt start atarax not effective.  Hyperbilirubinemia Likely due to pancreatic obstruction.  HTN (hypertension) Continue norvasc.  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  DVT prophylaxis: SCDs Code Status: Full Code Family Communication: discussed with pt and wife at bedside  Disposition Plan: DC to  home  Consults called: none  Admission status: Observation, Med-Surg   Kristopher Oppenheim, DO [facility] 11/09/2020, 4:07 PM

## 2020-11-10 ENCOUNTER — Ambulatory Visit (HOSPITAL_COMMUNITY): Admission: RE | Admit: 2020-11-10 | Payer: Medicare Other | Source: Home / Self Care | Admitting: Gastroenterology

## 2020-11-10 DIAGNOSIS — L299 Pruritus, unspecified: Secondary | ICD-10-CM

## 2020-11-10 DIAGNOSIS — K831 Obstruction of bile duct: Secondary | ICD-10-CM | POA: Diagnosis not present

## 2020-11-10 DIAGNOSIS — I2510 Atherosclerotic heart disease of native coronary artery without angina pectoris: Secondary | ICD-10-CM

## 2020-11-10 DIAGNOSIS — R935 Abnormal findings on diagnostic imaging of other abdominal regions, including retroperitoneum: Secondary | ICD-10-CM

## 2020-11-10 DIAGNOSIS — K8689 Other specified diseases of pancreas: Secondary | ICD-10-CM | POA: Diagnosis not present

## 2020-11-10 HISTORY — DX: Type 2 diabetes mellitus without complications: E11.9

## 2020-11-10 LAB — GLUCOSE, CAPILLARY
Glucose-Capillary: 151 mg/dL — ABNORMAL HIGH (ref 70–99)
Glucose-Capillary: 149 mg/dL — ABNORMAL HIGH (ref 70–99)
Glucose-Capillary: 184 mg/dL — ABNORMAL HIGH (ref 70–99)
Glucose-Capillary: 179 mg/dL — ABNORMAL HIGH (ref 70–99)

## 2020-11-10 LAB — CBC WITH DIFFERENTIAL/PLATELET
Abs Immature Granulocytes: 0.05 10*3/uL (ref 0.00–0.07)
Basophils Absolute: 0.1 10*3/uL (ref 0.0–0.1)
Basophils Relative: 1 %
Eosinophils Absolute: 0.4 10*3/uL (ref 0.0–0.5)
Eosinophils Relative: 4 %
HCT: 31.9 % — ABNORMAL LOW (ref 39.0–52.0)
Hemoglobin: 11.1 g/dL — ABNORMAL LOW (ref 13.0–17.0)
Immature Granulocytes: 1 %
Lymphocytes Relative: 26 %
Lymphs Abs: 2.1 10*3/uL (ref 0.7–4.0)
MCH: 30.1 pg (ref 26.0–34.0)
MCHC: 34.8 g/dL (ref 30.0–36.0)
MCV: 86.4 fL (ref 80.0–100.0)
Monocytes Absolute: 1 10*3/uL (ref 0.1–1.0)
Monocytes Relative: 12 %
Neutro Abs: 4.5 10*3/uL (ref 1.7–7.7)
Neutrophils Relative %: 56 %
Platelets: 262 10*3/uL (ref 150–400)
RBC: 3.69 MIL/uL — ABNORMAL LOW (ref 4.22–5.81)
RDW: 19.3 % — ABNORMAL HIGH (ref 11.5–15.5)
WBC: 8.2 10*3/uL (ref 4.0–10.5)
nRBC: 0 % (ref 0.0–0.2)

## 2020-11-10 LAB — COMPREHENSIVE METABOLIC PANEL
ALT: 63 U/L — ABNORMAL HIGH (ref 0–44)
AST: 61 U/L — ABNORMAL HIGH (ref 15–41)
Albumin: 3 g/dL — ABNORMAL LOW (ref 3.5–5.0)
Alkaline Phosphatase: 318 U/L — ABNORMAL HIGH (ref 38–126)
Anion gap: 9 (ref 5–15)
BUN: 20 mg/dL (ref 8–23)
CO2: 25 mmol/L (ref 22–32)
Calcium: 9.2 mg/dL (ref 8.9–10.3)
Chloride: 103 mmol/L (ref 98–111)
Creatinine, Ser: 0.99 mg/dL (ref 0.61–1.24)
GFR, Estimated: >60 mL/min (ref >60)
Glucose, Bld: 148 mg/dL — ABNORMAL HIGH (ref 70–99)
Potassium: 3.4 mmol/L — ABNORMAL LOW (ref 3.5–5.1)
Sodium: 137 mmol/L (ref 135–145)
Total Bilirubin: 14.5 mg/dL — ABNORMAL HIGH (ref 0.3–1.2)
Total Protein: 6.3 g/dL — ABNORMAL LOW (ref 6.5–8.1)

## 2020-11-10 LAB — SARS CORONAVIRUS 2 (TAT 6-24 HRS): SARS Coronavirus 2: NEGATIVE

## 2020-11-10 LAB — PROTIME-INR
INR: 0.9 (ref 0.8–1.2)
Prothrombin Time: 12.1 seconds (ref 11.4–15.2)

## 2020-11-10 MED ORDER — SODIUM CHLORIDE 0.9 % IV SOLN: 250.0000 mL | INTRAVENOUS | Status: DC | PRN

## 2020-11-10 MED ORDER — ALPRAZOLAM 0.25 MG PO TABS
0.2500 mg | ORAL_TABLET | Freq: Three times a day (TID) | ORAL | Status: DC | PRN
  Administered 2020-11-10 – 2020-11-13 (×4): 0.25 mg via ORAL
  Filled 2020-11-10 (×4): qty 1

## 2020-11-10 MED ORDER — POTASSIUM CHLORIDE 10 MEQ/100ML IV SOLN
10.0000 meq | INTRAVENOUS | Status: AC
  Administered 2020-11-10 (×2): 10 meq via INTRAVENOUS
  Filled 2020-11-10 (×2): qty 100

## 2020-11-10 NOTE — Consult Note (Signed)
Consultation  Referring Provider:  TRH/ Ramesh  Primary Care Physician:  Kennieth Rad, MD Primary Gastroenterologist:  Dr. Bryan Lemma  Reason for Consultation: Jaundice, abnormal imaging  HPI: Francisco Frank is a 70 y.o. male, who has been undergoing outpatient evaluation, and was brought in yesterday for planned admission, for procedures today. He had been seen in the office on October 20, 2020 with complaints of new onset of diabetes (found to have glucose greater than 700), fatigue and weight loss as well as polydipsia.  This resulted in an admission to the hospital in [place].  He related that he had lost about 33 pounds since February 2022.  During that admission he did have a noncontrasted CT scan which suggested some minimal stranding around the pancreatic head versus possible pancreatic atrophy.  Patient says that he had noticed some change in his bowel habits with oily stools. Labs were done which did show elevated LFTs-T bili 0.6/alk phos 269/AST 281/ALT 252. 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  There is moderate intra and extrahepatic biliary ductal dilation with CBD 1.7 cm.  Gallbladder mildly distended with small gallstones, no suspicious liver lesion.  No arterial involvement by tumor, the SMV is adjacent to the presumed tumor without encasement, portal vein uninvolved, no ascites there is a subtle left-sided pericolonic nodule of 5 mm, felt unlikely to represent peritoneal isolated metastases. He was seen in the office on 11/06/2020 by Dr. Bryan Lemma.  And arrangements were made for patient to undergo ERCP, sphincterotomy and stent placement as well as EUS with biopsy per Dr. Rush Landmark for today.  Patient does have history of hearing deficit, hyperlipidemia, hypertension, and coronary  artery disease status post stent placement for which he is on Plavix. Patient last took Plavix on the evening of 11/06/2020.  He denies any abdominal pain currently, no nausea or vomiting and has been able to eat without difficulty.  He says he is not sleeping well at all and has only been getting 3 to 4 hours of sleep per night due to intense itching.  He had just been started on Atarax but was not finding that helpful.  Past Medical History:  Diagnosis Date   Back pain    Diabetes mellitus without complication ([place])    Hearing deficit    Hyperlipemia    Hypertension    Insomnia    Sleep apnea     Past Surgical History:  Procedure Laterality Date   CARDIAC CATHETERIZATION     REPLACEMENT TOTAL KNEE     ROTATOR CUFF REPAIR      Prior to Admission medications   Medication Sig Start Date End Date Taking? Authorizing Provider  amLODipine (NORVASC) 5 MG tablet Take 5 mg by mouth at bedtime. 10/22/20  Yes [provider]  atenolol (TENORMIN) 50 MG tablet Take 50 mg by mouth at bedtime.   Yes [provider]  clopidogrel (PLAVIX) 75 MG tablet Take 75 mg by mouth at bedtime.   Yes [provider]  dicyclomine (BENTYL) 10 MG capsule Take 1 capsule (10 mg total) by mouth as needed for spasms. 10 mg po q 6 hours prn 08/06/20  Yes Cirigliano, Vito V, DO  Insulin Glargine (BASAGLAR KWIKPEN) 100 UNIT/ML Inject 6 Units into the skin at bedtime.  09/27/20  Yes [provider]  NOVOLOG FLEXPEN 100 UNIT/ML FlexPen Inject 10 Units into the skin 3 (three) times daily as needed for high blood sugar. If BS is 150-200=2 units, 201-250=4 units, 251-300=6 units, 301-350=8 units, 351-400=10 units. 09/18/20  Yes [provider]  traMADol (ULTRAM) 50 MG tablet Take 50 mg by mouth every 6 (six) hours as needed for moderate pain or severe pain. 06/11/20  Yes [provider]    Current Facility-Administered Medications  Medication Dose Route Frequency Provider Last  Rate Last Admin   0.9 %  sodium chloride infusion  250 mL Intravenous PRN Kristopher Oppenheim, DO       acetaminophen (TYLENOL) tablet 650 mg  650 mg Oral Q6H PRN Kristopher Oppenheim, DO       Or   acetaminophen (TYLENOL) suppository 650 mg  650 mg Rectal Q6H PRN Kristopher Oppenheim, DO       amLODipine (NORVASC) tablet 5 mg  5 mg Oral Daily Kristopher Oppenheim, DO       diphenhydrAMINE (BENADRYL) injection 25 mg  25 mg Intravenous Q6H PRN Kristopher Oppenheim, DO   25 mg at 11/10/20 0004   insulin aspart (novoLOG) injection 0-5 Units  0-5 Units Subcutaneous QHS Kristopher Oppenheim, DO   2 Units at 11/09/20 2125   insulin aspart (novoLOG) injection 0-9 Units  0-9 Units Subcutaneous TID WC Kristopher Oppenheim, DO   5 Units at 11/09/20 1752   insulin glargine-yfgn [facility]) injection 6 Units  6 Units Subcutaneous QHS Kristopher Oppenheim, DO   6 Units at 11/09/20 2126   lactated ringers infusion   Intravenous Continuous Kristopher Oppenheim, DO 75 mL/hr at 11/10/20 0400 Infusion Verify at 11/10/20 0400   ondansetron (ZOFRAN) tablet 4 mg  4 mg Oral Q6H PRN Kristopher Oppenheim, DO       Or   ondansetron [facility]) injection 4 mg  4 mg Intravenous Q6H PRN Kristopher Oppenheim, DO       potassium chloride 10 mEq in 100 mL IVPB  10 mEq Intravenous Q1 Hr x 2 Kc, Ramesh, MD       sertraline (ZOLOFT) tablet 100 mg  100 mg Oral Daily Kristopher Oppenheim, DO   100 mg at 11/09/20 1640   sodium chloride flush (NS) 0.9 % injection 3 mL  3 mL Intravenous Q12H Kristopher Oppenheim, DO   3 mL at 11/09/20 2125   sodium chloride flush (NS) 0.9 % injection 3 mL  3 mL Intravenous PRN Kristopher Oppenheim, DO       traMADol Veatrice Bourbon) tablet 50 mg  50 mg Oral Q4H PRN Kristopher Oppenheim, DO        Allergies as of 11/09/2020 - Review Complete 11/09/2020  Allergen Reaction Noted   Norco [hydrocodone-acetaminophen] Itching 11/09/2020    Family History  Problem Relation Age of Onset   Cirrhosis Father    Alcohol abuse Father    Colon cancer Maternal Grandmother    Colonic polyp Maternal Grandmother    Heart disease Paternal Grandfather    Esophageal  cancer Neg Hx    Pancreatic cancer Neg Hx    Stomach cancer Neg Hx     Social History   Socioeconomic History   Marital status: Married    Spouse name: Not on file   Number of children: Not on file   Years of education: Not on file   Highest education level: Not on file  Occupational History   Not on file  Tobacco Use   Smoking status: Never   Smokeless  tobacco: Current    Types: Snuff    Last attempt to quit: 2018  Vaping Use   Vaping Use: Never used  Substance and Sexual Activity   Alcohol use: Yes    Comment: rare   Drug use: Never   Sexual activity: Not Currently  Other Topics Concern   Not on file  Social History Narrative   Not on file   Social Determinants of Health   Financial Resource Strain: Not on file  Food Insecurity: Not on file  Transportation Needs: Not on file  Physical Activity: Not on file  Stress: Not on file  Social Connections: Not on file  Intimate Partner Violence: Not on file    Review of Systems: Pertinent positive and negative review of systems were noted in the above HPI section.  All other review of systems was otherwise negative.   Physical Exam: Vital signs in last 24 hours: Temp:  [97.9 F (36.6 C)-98.5 F (36.9 C)] 98.3 F (36.8 C) (08/22 0511) Pulse Rate:  [55-65] 55 (08/22 0511) Resp:  [15-18] 16 (08/22 0511) BP: (94-133)/(64-88) 118/64 (08/22 0511) SpO2:  [96 %-99 %] 96 % (08/22 0511) Weight:  [72.5 kg] 72.5 kg (08/22 0500) Last BM Date: 11/09/20 General:   Alert,  Well-developed, thin older white male pleasant and cooperative in NAD.  Deeply jaundiced Head:  Normocephalic and atraumatic. Eyes:  Sclera icteric conjunctiva pink. Ears:  Normal auditory acuity. Nose:  No deformity, discharge,  or lesions. Mouth:  No deformity or lesions.   Neck:  Supple; no masses or thyromegaly. Lungs:  Clear throughout to auscultation.   No wheezes, crackles, or rhonchi.  Heart:  Regular rate and rhythm; no murmurs, clicks, rubs,  or  gallops. Abdomen:  Soft,nontender, BS active,nonpalp mass or hsm.   Rectal: Not done Msk:  Symmetrical without gross deformities. . Pulses:  Normal pulses noted. Extremities:  Without clubbing or edema. Neurologic:  Alert and  oriented x4;  grossly normal neurologically. Skin: Jaundiced. Psych:  Alert and cooperative. Normal mood and affect.  Intake/Output from previous day: 08/21 0701 - 08/22 0700 In: 769.8 [P.O.:480; I.V.:289.8] Out: -  Intake/Output this shift: No intake/output data recorded.  Lab Results: Recent Labs    11/09/20 1600 11/10/20 0445  WBC 8.1 8.2  HGB 12.8* 11.1*  HCT 36.4* 31.9*  PLT 292 262   BMET Recent Labs    11/09/20 1600 11/10/20 0445  NA 134* 137  K 3.9 3.4*  CL 101 103  CO2 23 25  GLUCOSE 285* 148*  BUN 20 20  CREATININE 1.04 0.99  CALCIUM 9.8 9.2   LFT Recent Labs    11/10/20 0445  PROT 6.3*  ALBUMIN 3.0*  AST 61*  ALT 63*  ALKPHOS 318*  BILITOT 14.5*   PT/INR No results for input(s): LABPROT, INR in the last 72 hours. Hepatitis Panel No results for input(s): HEPBSAG, HCVAB, HEPAIGM, HEPBIGM in the last 72 hours.   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  #2 new onset diabetes-diagnosed June 2022-on insulin #3 history of coronary artery disease-on Plavix #4 hypertension #5.  Hyperlipidemia #6 pruritus secondary to #1   PLAN: Last dose of Plavix was on 11/07/2018 pm-for that reason ERCP/EUS will be rescheduled for tomorrow 11/11/2020 with Dr. Rush Landmark, to allow a 5-day washout.  Carb modified diet today, n.p.o. after midnight Preprocedure  IV Unasyn ordered Check INR, CA 19-9 Will order Xanax 0.25 3 times daily as needed, can take at at bedtime to help with sleep. As Atarax had not been helpful hospitalist is ordered  Zoloft to help with pruritus.   Monzerat Handler PA-C 11/10/2020, 8:29 AM

## 2020-11-10 NOTE — Progress Notes (Signed)
   11/10/20 1100  Mobility  Activity Ambulated in hall  Level of Assistance Independent  Assistive Device None (psuhed IV pole)  Distance Ambulated (ft) 350 ft  Mobility Ambulated with assistance in hallway  Mobility Response Tolerated well  Mobility performed by Mobility specialist  $Mobility charge 1 Mobility   Pt ambulated in hallway about 31f while pushing IV pole. He stated he does not need it for balancing purposes, but like to push it. He noted that he was quite tired today and wanted to keep his walk shorter. Tolerated well. Pt left in bed with call bell at side, and family in room.    KEast CarrollSpecialist Acute Rehab Services Office: [phone]

## 2020-11-10 NOTE — H&P (View-Only) (Signed)
Consultation  Referring Provider:  TRH/ Ramesh  Primary Care Physician:  Kennieth Rad, MD Primary Gastroenterologist:  Dr. Bryan Lemma  Reason for Consultation: Jaundice, abnormal imaging  HPI: Francisco Frank is a 70 y.o. male, who has been undergoing outpatient evaluation, and was brought in yesterday for planned admission, for procedures today. He had been seen in the office on October 20, 2020 with complaints of new onset of diabetes (found to have glucose greater than 700), fatigue and weight loss as well as polydipsia.  This resulted in an admission to the hospital in [place].  He related that he had lost about 33 pounds since February 2022.  During that admission he did have a noncontrasted CT scan which suggested some minimal stranding around the pancreatic head versus possible pancreatic atrophy.  Patient says that he had noticed some change in his bowel habits with oily stools. Labs were done which did show elevated LFTs-T bili 0.6/alk phos 269/AST 281/ALT 252. 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  There is moderate intra and extrahepatic biliary ductal dilation with CBD 1.7 cm.  Gallbladder mildly distended with small gallstones, no suspicious liver lesion.  No arterial involvement by tumor, the SMV is adjacent to the presumed tumor without encasement, portal vein uninvolved, no ascites there is a subtle left-sided pericolonic nodule of 5 mm, felt unlikely to represent peritoneal isolated metastases. He was seen in the office on 11/06/2020 by Dr. Bryan Lemma.  And arrangements were made for patient to undergo ERCP, sphincterotomy and stent placement as well as EUS with biopsy per Dr. Rush Landmark for today.  Patient does have history of hearing deficit, hyperlipidemia, hypertension, and coronary  artery disease status post stent placement for which he is on Plavix. Patient last took Plavix on the evening of 11/06/2020.  He denies any abdominal pain currently, no nausea or vomiting and has been able to eat without difficulty.  He says he is not sleeping well at all and has only been getting 3 to 4 hours of sleep per night due to intense itching.  He had just been started on Atarax but was not finding that helpful.  Past Medical History:  Diagnosis Date   Back pain    Diabetes mellitus without complication ([place])    Hearing deficit    Hyperlipemia    Hypertension    Insomnia    Sleep apnea     Past Surgical History:  Procedure Laterality Date   CARDIAC CATHETERIZATION     REPLACEMENT TOTAL KNEE     ROTATOR CUFF REPAIR      Prior to Admission medications   Medication Sig Start Date End Date Taking? Authorizing Provider  amLODipine (NORVASC) 5 MG tablet Take 5 mg by mouth at bedtime. 10/22/20  Yes [provider]  atenolol (TENORMIN) 50 MG tablet Take 50 mg by mouth at bedtime.   Yes [provider]  clopidogrel (PLAVIX) 75 MG tablet Take 75 mg by mouth at bedtime.   Yes [provider]  dicyclomine (BENTYL) 10 MG capsule Take 1 capsule (10 mg total) by mouth as needed for spasms. 10 mg po q 6 hours prn 08/06/20  Yes Cirigliano, Vito V, DO  Insulin Glargine (BASAGLAR KWIKPEN) 100 UNIT/ML Inject 6 Units into the skin at bedtime.  09/27/20  Yes [provider]  NOVOLOG FLEXPEN 100 UNIT/ML FlexPen Inject 10 Units into the skin 3 (three) times daily as needed for high blood sugar. If BS is 150-200=2 units, 201-250=4 units, 251-300=6 units, 301-350=8 units, 351-400=10 units. 09/18/20  Yes [provider]  traMADol (ULTRAM) 50 MG tablet Take 50 mg by mouth every 6 (six) hours as needed for moderate pain or severe pain. 06/11/20  Yes [provider]    Current Facility-Administered Medications  Medication Dose Route Frequency Provider Last  Rate Last Admin   0.9 %  sodium chloride infusion  250 mL Intravenous PRN Kristopher Oppenheim, DO       acetaminophen (TYLENOL) tablet 650 mg  650 mg Oral Q6H PRN Kristopher Oppenheim, DO       Or   acetaminophen (TYLENOL) suppository 650 mg  650 mg Rectal Q6H PRN Kristopher Oppenheim, DO       amLODipine (NORVASC) tablet 5 mg  5 mg Oral Daily Kristopher Oppenheim, DO       diphenhydrAMINE (BENADRYL) injection 25 mg  25 mg Intravenous Q6H PRN Kristopher Oppenheim, DO   25 mg at 11/10/20 0004   insulin aspart (novoLOG) injection 0-5 Units  0-5 Units Subcutaneous QHS Kristopher Oppenheim, DO   2 Units at 11/09/20 2125   insulin aspart (novoLOG) injection 0-9 Units  0-9 Units Subcutaneous TID WC Kristopher Oppenheim, DO   5 Units at 11/09/20 1752   insulin glargine-yfgn [facility]) injection 6 Units  6 Units Subcutaneous QHS Kristopher Oppenheim, DO   6 Units at 11/09/20 2126   lactated ringers infusion   Intravenous Continuous Kristopher Oppenheim, DO 75 mL/hr at 11/10/20 0400 Infusion Verify at 11/10/20 0400   ondansetron (ZOFRAN) tablet 4 mg  4 mg Oral Q6H PRN Kristopher Oppenheim, DO       Or   ondansetron [facility]) injection 4 mg  4 mg Intravenous Q6H PRN Kristopher Oppenheim, DO       potassium chloride 10 mEq in 100 mL IVPB  10 mEq Intravenous Q1 Hr x 2 Kc, Ramesh, MD       sertraline (ZOLOFT) tablet 100 mg  100 mg Oral Daily Kristopher Oppenheim, DO   100 mg at 11/09/20 1640   sodium chloride flush (NS) 0.9 % injection 3 mL  3 mL Intravenous Q12H Kristopher Oppenheim, DO   3 mL at 11/09/20 2125   sodium chloride flush (NS) 0.9 % injection 3 mL  3 mL Intravenous PRN Kristopher Oppenheim, DO       traMADol Veatrice Bourbon) tablet 50 mg  50 mg Oral Q4H PRN Kristopher Oppenheim, DO        Allergies as of 11/09/2020 - Review Complete 11/09/2020  Allergen Reaction Noted   Norco [hydrocodone-acetaminophen] Itching 11/09/2020    Family History  Problem Relation Age of Onset   Cirrhosis Father    Alcohol abuse Father    Colon cancer Maternal Grandmother    Colonic polyp Maternal Grandmother    Heart disease Paternal Grandfather    Esophageal  cancer Neg Hx    Pancreatic cancer Neg Hx    Stomach cancer Neg Hx     Social History   Socioeconomic History   Marital status: Married    Spouse name: Not on file   Number of children: Not on file   Years of education: Not on file   Highest education level: Not on file  Occupational History   Not on file  Tobacco Use   Smoking status: Never   Smokeless  tobacco: Current    Types: Snuff    Last attempt to quit: 2018  Vaping Use   Vaping Use: Never used  Substance and Sexual Activity   Alcohol use: Yes    Comment: rare   Drug use: Never   Sexual activity: Not Currently  Other Topics Concern   Not on file  Social History Narrative   Not on file   Social Determinants of Health   Financial Resource Strain: Not on file  Food Insecurity: Not on file  Transportation Needs: Not on file  Physical Activity: Not on file  Stress: Not on file  Social Connections: Not on file  Intimate Partner Violence: Not on file    Review of Systems: Pertinent positive and negative review of systems were noted in the above HPI section.  All other review of systems was otherwise negative.   Physical Exam: Vital signs in last 24 hours: Temp:  [97.9 F (36.6 C)-98.5 F (36.9 C)] 98.3 F (36.8 C) (08/22 0511) Pulse Rate:  [55-65] 55 (08/22 0511) Resp:  [15-18] 16 (08/22 0511) BP: (94-133)/(64-88) 118/64 (08/22 0511) SpO2:  [96 %-99 %] 96 % (08/22 0511) Weight:  [72.5 kg] 72.5 kg (08/22 0500) Last BM Date: 11/09/20 General:   Alert,  Well-developed, thin older white male pleasant and cooperative in NAD.  Deeply jaundiced Head:  Normocephalic and atraumatic. Eyes:  Sclera icteric conjunctiva pink. Ears:  Normal auditory acuity. Nose:  No deformity, discharge,  or lesions. Mouth:  No deformity or lesions.   Neck:  Supple; no masses or thyromegaly. Lungs:  Clear throughout to auscultation.   No wheezes, crackles, or rhonchi.  Heart:  Regular rate and rhythm; no murmurs, clicks, rubs,  or  gallops. Abdomen:  Soft,nontender, BS active,nonpalp mass or hsm.   Rectal: Not done Msk:  Symmetrical without gross deformities. . Pulses:  Normal pulses noted. Extremities:  Without clubbing or edema. Neurologic:  Alert and  oriented x4;  grossly normal neurologically. Skin: Jaundiced. Psych:  Alert and cooperative. Normal mood and affect.  Intake/Output from previous day: 08/21 0701 - 08/22 0700 In: 769.8 [P.O.:480; I.V.:289.8] Out: -  Intake/Output this shift: No intake/output data recorded.  Lab Results: Recent Labs    11/09/20 1600 11/10/20 0445  WBC 8.1 8.2  HGB 12.8* 11.1*  HCT 36.4* 31.9*  PLT 292 262   BMET Recent Labs    11/09/20 1600 11/10/20 0445  NA 134* 137  K 3.9 3.4*  CL 101 103  CO2 23 25  GLUCOSE 285* 148*  BUN 20 20  CREATININE 1.04 0.99  CALCIUM 9.8 9.2   LFT Recent Labs    11/10/20 0445  PROT 6.3*  ALBUMIN 3.0*  AST 61*  ALT 63*  ALKPHOS 318*  BILITOT 14.5*   PT/INR No results for input(s): LABPROT, INR in the last 72 hours. Hepatitis Panel No results for input(s): HEPBSAG, HCVAB, HEPAIGM, HEPBIGM in the last 72 hours.   IMPRESSION:  #76 70 year old white male with pancreatic head mass, and obstructive jaundice, admitted to undergo further diagnostic and therapeutic evaluation with ERCP, sphincterotomy and stent placement as well as EUS with pancreatic biopsy.  #2 new onset diabetes-diagnosed June 2022-on insulin #3 history of coronary artery disease-on Plavix #4 hypertension #5.  Hyperlipidemia #6 pruritus secondary to #1   PLAN: Last dose of Plavix was on 11/07/2018 pm-for that reason ERCP/EUS will be rescheduled for tomorrow 11/11/2020 with Dr. Rush Landmark, to allow a 5-day washout.  Carb modified diet today, n.p.o. after midnight Preprocedure  IV Unasyn ordered Check INR, [place] 19-9 Will order Xanax 0.25 3 times daily as needed, can take at at bedtime to help with sleep. As Atarax had not been helpful hospitalist is ordered  Zoloft to help with pruritus.   Maymie Brunke PA-C 11/10/2020, 8:29 AM

## 2020-11-10 NOTE — Progress Notes (Signed)
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 He was recently seen by GI for 20 pound weight loss since April and labs showed normal liver enzyme and further work-up showed minimal stranding at pancreatic head cholelithiasis and continue to have weight loss and will continue to follow-up with GI and back in August beginning he had abnormal LFTs MRI of the abdomen showed pancreatic body mass. He was directly admitted for further work-up by GI.  Subjective: Seen this morning daughter at the bedside complaints of gas pain.  His procedure is being postponed for tomorrow.  Assessment & Plan:  Abnormal LFTs Pancreatic mass suspecting neoplasm:  followed by GI planning for ERCP plus minus EUS with biopsy tomorrow.  Continue hold Plavix IV fluids p.o. diet and n.p.o. past midnight.  Hypokalemia repleted  Anemia likely from chronic disease.  Monitor H&H  Cholestatic pruritus was started on Zoloft.  Atarax not effective  Essential hypertension blood pressure controlled on Norvasc, continue to hold atenolol.  Type 2 diabetes mellitus on long-term insulin continue Lantus 16 units, SSI  CAD in native artery: Plavix remains on hold due to procedure.  Diet Order             Diet NPO time specified  Diet effective midnight           Diet Carb Modified Fluid consistency: Thin; Room service appropriate? Yes  Diet effective now                  Patient's Body mass index is 23.62 kg/m. DVT prophylaxis: SCDs Start: 11/09/20 1636 Code Status:   Code Status: Full Code  Family Communication: plan of care discussed with patient at bedside. Status is: Admitted as observation Patient remains hospitalized for ongoing management of pancreatic mass Dispo: The  patient is from: Home              Anticipated d/c is to: Home              Patient currently is not medically stable to d/c.   Difficult to place patient No  Unresulted Labs (From admission, onward)    None      Medications reviewed:  Scheduled Meds:  amLODipine  5 mg Oral Daily   insulin aspart  0-5 Units Subcutaneous QHS   insulin aspart  0-9 Units Subcutaneous TID WC   insulin glargine-yfgn  6 Units Subcutaneous QHS   sertraline  100 mg Oral Daily   sodium chloride flush  3 mL Intravenous Q12H   Continuous Infusions:  sodium chloride     lactated ringers 75 mL/hr at 11/10/20 0400   Consultants:see note  Procedures:see note Antimicrobials: Anti-infectives (From admission, onward)    None      Culture/Microbiology No results found for: SDES, SPECREQUEST, CULT, REPTSTATUS  Other culture-see note  Objective: Vitals: Today's Vitals   11/10/20 0027 11/10/20 0500 11/10/20 0511 11/10/20 1008  BP:   118/64 111/82  Pulse:   (!) 55   Resp:   16   Temp:   98.3 F (36.8 C)   TempSrc:   Oral   SpO2:   96%   Weight:  72.5 kg    Height:      PainSc: 0-No pain       Intake/Output Summary (  Last 24 hours) at 11/10/2020 1342 Last data filed at 11/10/2020 0400 Gross per 24 hour  Intake 769.84 ml  Output --  Net 769.84 ml   Filed Weights   11/09/20 2353 11/10/20 0500  Weight: 72.5 kg 72.5 kg   Weight change:   Intake/Output from previous day: 08/21 0701 - 08/22 0700 In: 769.8 [P.O.:480; I.V.:289.8] Out: -  Intake/Output this shift: No intake/output data recorded. Filed Weights   11/09/20 2353 11/10/20 0500  Weight: 72.5 kg 72.5 kg   Examination: General exam: AAO X3, icteric pleasant HEENT:Oral mucosa moist, Ear/Nose WNL grossly,dentition normal. Respiratory system: bilaterally diminished,no use of accessory muscle, non tender. Cardiovascular system: S1 & S2 +,No JVD. Gastrointestinal system: Abdomen soft, NT,ND, BS+. Nervous System:Alert, awake, moving  extremities Extremities: no edema, distal peripheral pulses palpable.  Skin: No rashes,no icterus. MSK: Normal muscle bulk,tone, power Data Reviewed: I have personally reviewed following labs and imaging studies CBC: Recent Labs  Lab 11/09/20 1600 11/10/20 0445  WBC 8.1 8.2  NEUTROABS 4.7 4.5  HGB 12.8* 11.1*  HCT 36.4* 31.9*  MCV 86.1 86.4  PLT 292 [id number]   Basic Metabolic Panel: Recent Labs  Lab 11/06/20 1156 11/09/20 1600 11/10/20 0445  NA 136 134* 137  K 4.0 3.9 3.4*  CL 101 101 103  CO2 '24 23 25  '$ GLUCOSE 252* 285* 148*  BUN '23 20 20  '$ CREATININE 1.41 1.04 0.99  CALCIUM 9.8 9.8 9.2  MG  --  2.0  --    GFR: Estimated Creatinine Clearance: 68.4 mL/min (by C-G formula based on SCr of 0.99 mg/dL). Liver Function Tests: Recent Labs  Lab 11/06/20 1156 11/09/20 1600 11/10/20 0445  AST 79* 74* 61*  ALT 83* 75* 63*  ALKPHOS 447* 389* 318*  BILITOT 14.2* 16.4* 14.5*  PROT 7.0 7.5 6.3*  ALBUMIN 3.9 3.7 3.0*   No results for input(s): LIPASE, AMYLASE in the last 168 hours. No results for input(s): AMMONIA in the last 168 hours. Coagulation Profile: Recent Labs  Lab 11/10/20 1019  INR 0.9   Cardiac Enzymes: No results for input(s): CKTOTAL, CKMB, CKMBINDEX, TROPONINI in the last 168 hours. BNP (last 3 results) No results for input(s): PROBNP in the last 8760 hours. HbA1C: Recent Labs    11/09/20 1600  HGBA1C 9.6*   CBG: Recent Labs  Lab 11/09/20 1620 11/09/20 2103 11/10/20 0746 11/10/20 1147  GLUCAP 263* 250* 151* 149*   Lipid Profile: No results for input(s): CHOL, HDL, LDLCALC, TRIG, CHOLHDL, LDLDIRECT in the last 72 hours. Thyroid Function Tests: No results for input(s): TSH, T4TOTAL, FREET4, T3FREE, THYROIDAB in the last 72 hours. Anemia Panel: No results for input(s): VITAMINB12, FOLATE, FERRITIN, TIBC, IRON, RETICCTPCT in the last 72 hours. Sepsis Labs: No results for input(s): PROCALCITON, LATICACIDVEN in the last 168 hours.  Recent  Results (from the past 240 hour(s))  SARS CORONAVIRUS 2 (TAT 6-24 HRS) Nasopharyngeal Nasopharyngeal Swab     Status: None   Collection Time: 11/09/20  5:15 PM   Specimen: Nasopharyngeal Swab  Result Value Ref Range Status   SARS Coronavirus 2 NEGATIVE NEGATIVE Final    Comment: (NOTE) SARS-CoV-2 target nucleic acids are NOT DETECTED.  The SARS-CoV-2 RNA is generally detectable in upper and lower respiratory specimens during the acute phase of infection. Negative results do not preclude SARS-CoV-2 infection, do not rule out co-infections with other pathogens, and should not be used as the sole basis for treatment or other patient management decisions. Negative results must be combined with clinical  observations, patient history, and epidemiological information. The expected result is Negative.  Fact Sheet for Patients: SugarRoll.be  Fact Sheet for Healthcare Providers: https://www.woods-mathews.com/  This test is not yet approved or cleared by the [place] FDA and  has been authorized for detection and/or diagnosis of SARS-CoV-2 by FDA under an Emergency Use Authorization (EUA). This EUA will remain  in effect (meaning this test can be used) for the duration of the COVID-19 declaration under Se ction 564(b)(1) of the Act, 21 U.S.C. section 360bbb-3(b)(1), unless the authorization is terminated or revoked sooner.  Performed at [facility], [place] [address], [place], [place] [address]      Radiology Studies: No results found.   LOS: 0 days   Antonieta Pert, MD [facility]  11/10/2020, 1:42 PM

## 2020-11-11 ENCOUNTER — Inpatient Hospital Stay (HOSPITAL_COMMUNITY): Payer: Medicare Other | Admitting: Anesthesiology

## 2020-11-11 ENCOUNTER — Encounter (HOSPITAL_COMMUNITY)
Admission: AD | Disposition: A | Discharge status: Home / Self Care | Payer: Self-pay | Source: Home / Self Care | Attending: Internal Medicine

## 2020-11-11 ENCOUNTER — Inpatient Hospital Stay (HOSPITAL_COMMUNITY): Payer: Medicare Other

## 2020-11-11 ENCOUNTER — Encounter (HOSPITAL_COMMUNITY): Payer: Self-pay | Admitting: Internal Medicine

## 2020-11-11 DIAGNOSIS — K2970 Gastritis, unspecified, without bleeding: Secondary | ICD-10-CM

## 2020-11-11 DIAGNOSIS — D490 Neoplasm of unspecified behavior of digestive system: Secondary | ICD-10-CM

## 2020-11-11 DIAGNOSIS — E785 Hyperlipidemia, unspecified: Secondary | ICD-10-CM | POA: Diagnosis present

## 2020-11-11 DIAGNOSIS — F17290 Nicotine dependence, other tobacco product, uncomplicated: Secondary | ICD-10-CM | POA: Diagnosis present

## 2020-11-11 DIAGNOSIS — C250 Malignant neoplasm of head of pancreas: Secondary | ICD-10-CM | POA: Diagnosis present

## 2020-11-11 DIAGNOSIS — I2510 Atherosclerotic heart disease of native coronary artery without angina pectoris: Secondary | ICD-10-CM | POA: Diagnosis present

## 2020-11-11 DIAGNOSIS — Z8249 Family history of ischemic heart disease and other diseases of the circulatory system: Secondary | ICD-10-CM | POA: Diagnosis not present

## 2020-11-11 DIAGNOSIS — Z800 Family history of malignant neoplasm of digestive organs: Secondary | ICD-10-CM | POA: Diagnosis not present

## 2020-11-11 DIAGNOSIS — E876 Hypokalemia: Secondary | ICD-10-CM | POA: Diagnosis present

## 2020-11-11 DIAGNOSIS — C259 Malignant neoplasm of pancreas, unspecified: Secondary | ICD-10-CM | POA: Diagnosis not present

## 2020-11-11 DIAGNOSIS — L298 Other pruritus: Secondary | ICD-10-CM | POA: Diagnosis present

## 2020-11-11 DIAGNOSIS — Z7902 Long term (current) use of antithrombotics/antiplatelets: Secondary | ICD-10-CM | POA: Diagnosis not present

## 2020-11-11 DIAGNOSIS — E119 Type 2 diabetes mellitus without complications: Secondary | ICD-10-CM | POA: Diagnosis present

## 2020-11-11 DIAGNOSIS — Z811 Family history of alcohol abuse and dependence: Secondary | ICD-10-CM | POA: Diagnosis not present

## 2020-11-11 DIAGNOSIS — Z955 Presence of coronary angioplasty implant and graft: Secondary | ICD-10-CM | POA: Diagnosis not present

## 2020-11-11 DIAGNOSIS — K831 Obstruction of bile duct: Secondary | ICD-10-CM | POA: Diagnosis present

## 2020-11-11 DIAGNOSIS — K449 Diaphragmatic hernia without obstruction or gangrene: Secondary | ICD-10-CM | POA: Diagnosis present

## 2020-11-11 DIAGNOSIS — R945 Abnormal results of liver function studies: Secondary | ICD-10-CM | POA: Diagnosis not present

## 2020-11-11 DIAGNOSIS — Z20822 Contact with and (suspected) exposure to covid-19: Secondary | ICD-10-CM | POA: Diagnosis present

## 2020-11-11 DIAGNOSIS — Z794 Long term (current) use of insulin: Secondary | ICD-10-CM | POA: Diagnosis not present

## 2020-11-11 DIAGNOSIS — D638 Anemia in other chronic diseases classified elsewhere: Secondary | ICD-10-CM | POA: Diagnosis present

## 2020-11-11 DIAGNOSIS — I10 Essential (primary) hypertension: Secondary | ICD-10-CM | POA: Diagnosis present

## 2020-11-11 DIAGNOSIS — K8689 Other specified diseases of pancreas: Secondary | ICD-10-CM | POA: Diagnosis not present

## 2020-11-11 HISTORY — PX: FINE NEEDLE ASPIRATION: SHX6590

## 2020-11-11 HISTORY — PX: ENDOSCOPIC RETROGRADE CHOLANGIOPANCREATOGRAPHY (ERCP) WITH PROPOFOL: SHX5810

## 2020-11-11 HISTORY — PX: BIOPSY: SHX5522

## 2020-11-11 HISTORY — PX: SPHINCTEROTOMY: SHX5544

## 2020-11-11 HISTORY — PX: HEMOSTASIS CONTROL: SHX6838

## 2020-11-11 HISTORY — PX: EUS: SHX5427

## 2020-11-11 HISTORY — PX: ESOPHAGOGASTRODUODENOSCOPY (EGD) WITH PROPOFOL: SHX5813

## 2020-11-11 LAB — HEMOGLOBIN AND HEMATOCRIT, BLOOD
HCT: 33.1 % — ABNORMAL LOW (ref 39.0–52.0)
Hemoglobin: 11.4 g/dL — ABNORMAL LOW (ref 13.0–17.0)

## 2020-11-11 LAB — COMPREHENSIVE METABOLIC PANEL
ALT: 58 U/L — ABNORMAL HIGH (ref 0–44)
AST: 58 U/L — ABNORMAL HIGH (ref 15–41)
Albumin: 2.6 g/dL — ABNORMAL LOW (ref 3.5–5.0)
Alkaline Phosphatase: 311 U/L — ABNORMAL HIGH (ref 38–126)
Anion gap: 9 (ref 5–15)
BUN: 16 mg/dL (ref 8–23)
CO2: 23 mmol/L (ref 22–32)
Calcium: 9.2 mg/dL (ref 8.9–10.3)
Chloride: 101 mmol/L (ref 98–111)
Creatinine, Ser: 0.8 mg/dL (ref 0.61–1.24)
GFR, Estimated: >60 mL/min (ref >60)
Glucose, Bld: 177 mg/dL — ABNORMAL HIGH (ref 70–99)
Potassium: 3.7 mmol/L (ref 3.5–5.1)
Sodium: 133 mmol/L — ABNORMAL LOW (ref 135–145)
Total Bilirubin: 15.6 mg/dL — ABNORMAL HIGH (ref 0.3–1.2)
Total Protein: 5.3 g/dL — ABNORMAL LOW (ref 6.5–8.1)

## 2020-11-11 LAB — GLUCOSE, CAPILLARY
Glucose-Capillary: 180 mg/dL — ABNORMAL HIGH (ref 70–99)
Glucose-Capillary: 172 mg/dL — ABNORMAL HIGH (ref 70–99)
Glucose-Capillary: 243 mg/dL — ABNORMAL HIGH (ref 70–99)
Glucose-Capillary: 223 mg/dL — ABNORMAL HIGH (ref 70–99)
Glucose-Capillary: 243 mg/dL — ABNORMAL HIGH (ref 70–99)

## 2020-11-11 LAB — C-REACTIVE PROTEIN: CRP: 5.9 mg/dL — ABNORMAL HIGH (ref <1.0)

## 2020-11-11 LAB — ABO/RH: ABO/RH(D): O POS

## 2020-11-11 LAB — TYPE AND SCREEN
ABO/RH(D): O POS
Antibody Screen: NEGATIVE

## 2020-11-11 LAB — LIPASE, BLOOD: Lipase: 18 U/L (ref 11–51)

## 2020-11-11 IMAGING — RF DG ERCP WO/W SPHINCTEROTOMY
1 series · 6 of 6 positions shown · non-contrast
Comparison: MR abdomen, [DATE].

CLINICAL DATA: ERCP.

EXAM:
ERCP
TECHNIQUE: Multiple spot images obtained with the fluoroscopic device and
submitted for interpretation post-procedure.
FLUOROSCOPY TIME:  Fluoroscopy Time:  6 minutes and 2 seconds.
Radiation Exposure Index (if provided by the fluoroscopic device):
None provided
Number of Acquired Spot Images: 6

[Series 1: run · 6 of 6 slices shown]
[im 1/6]
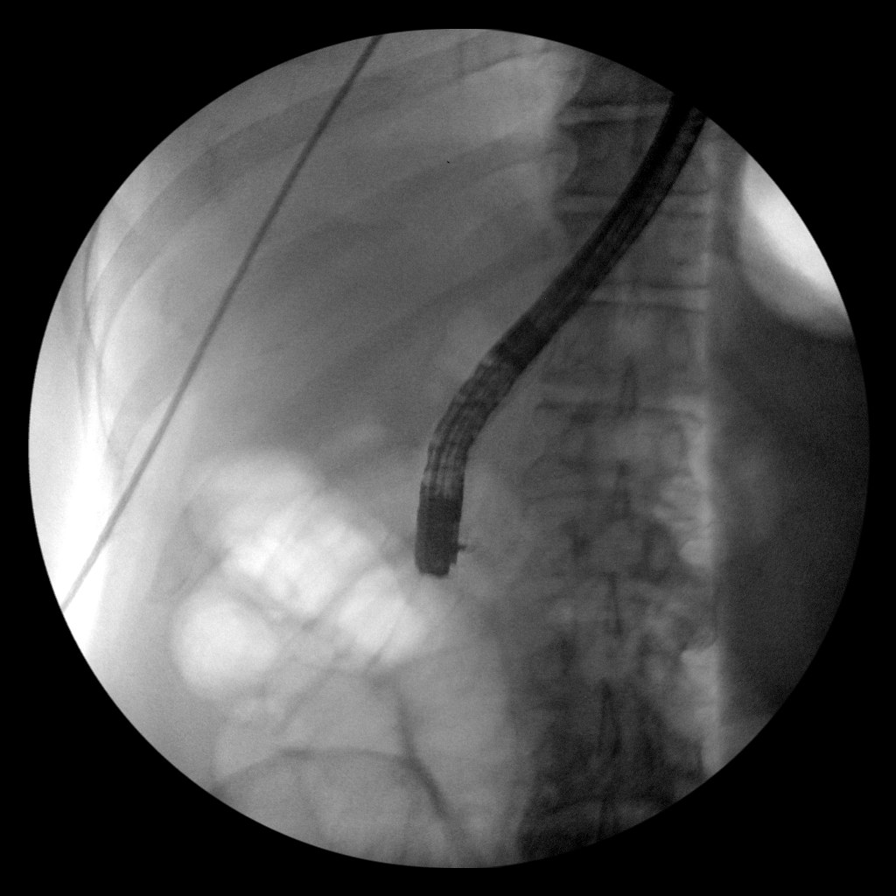
[im 2/6]
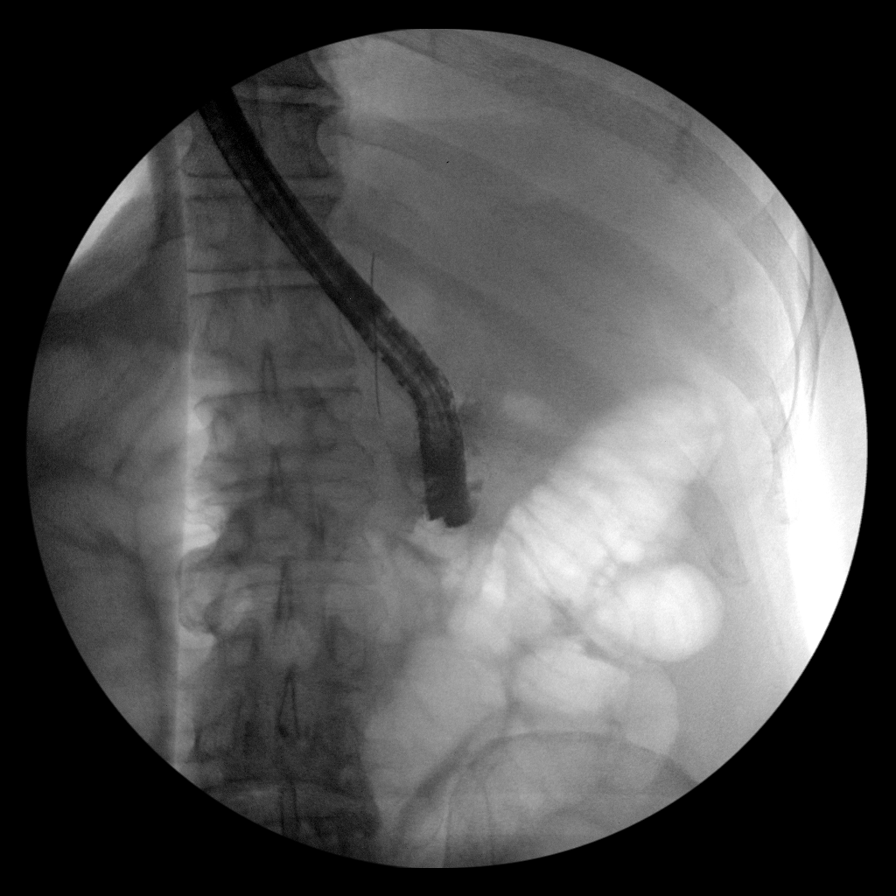
[im 3/6]
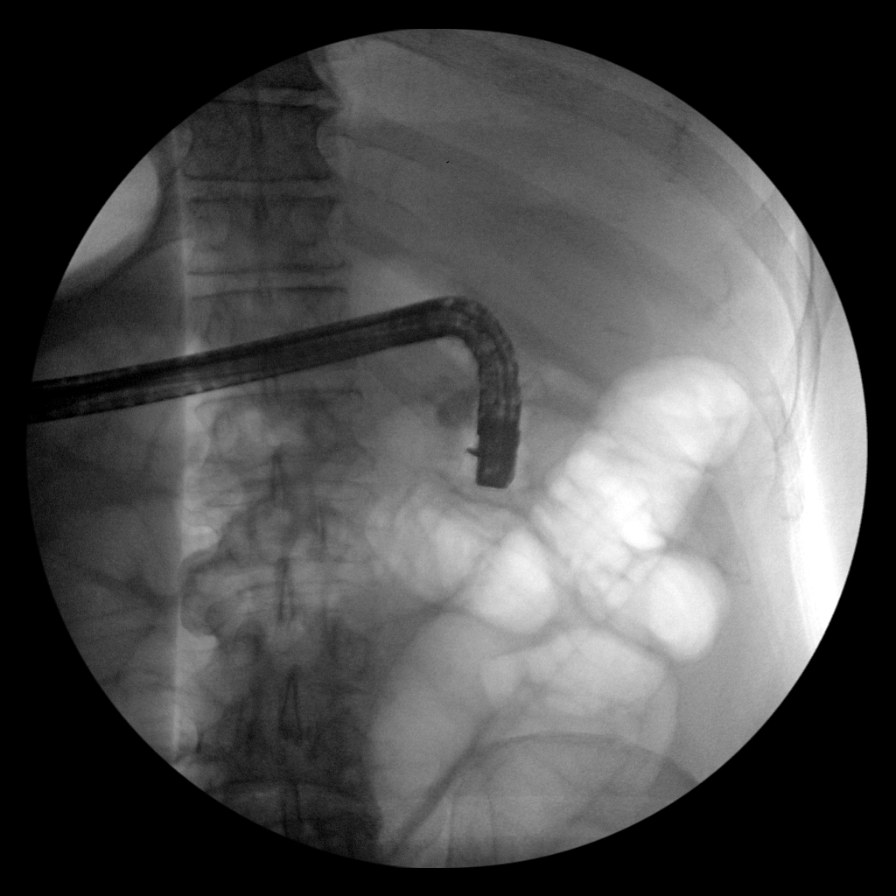
[im 4/6]
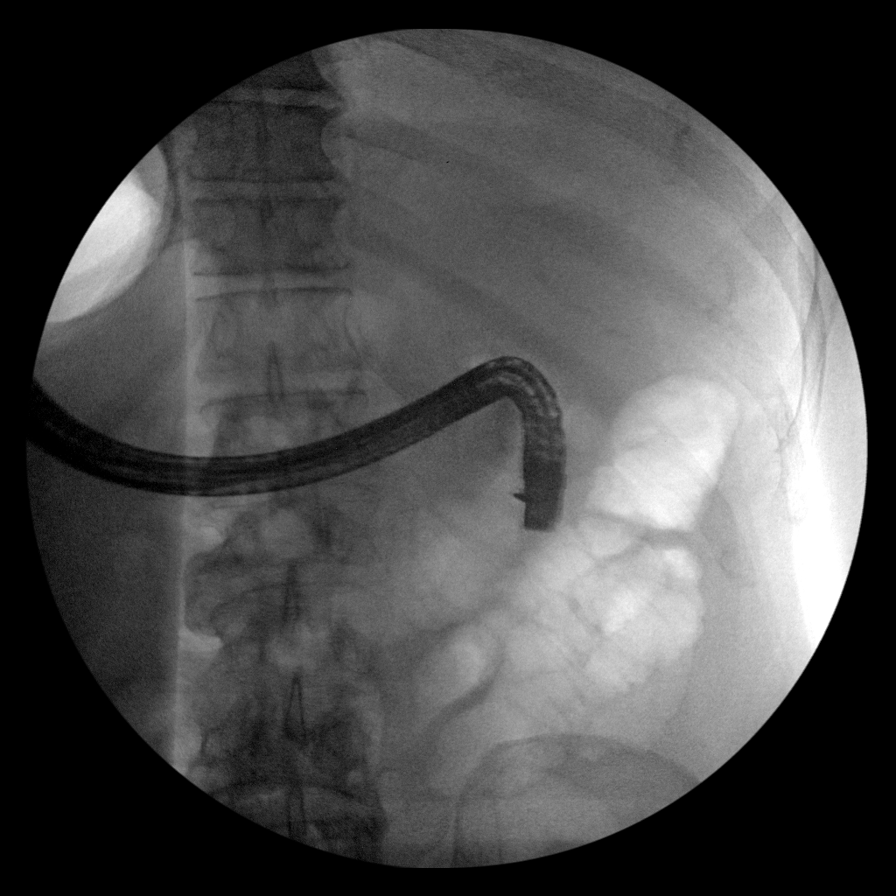
[im 5/6]
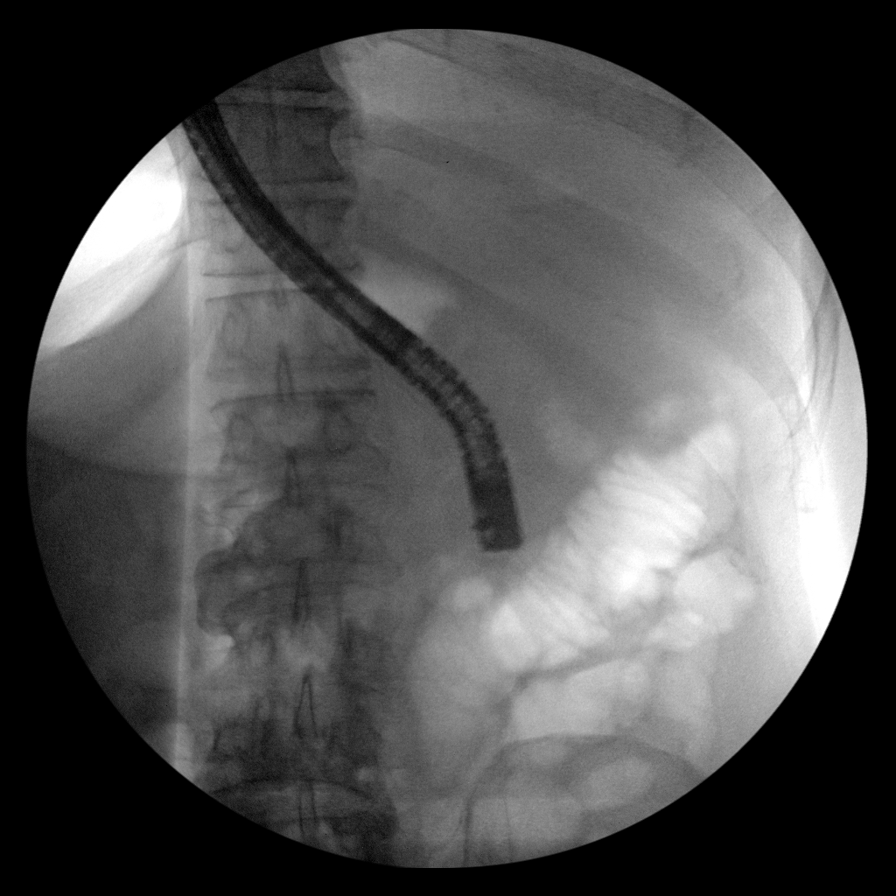
[im 6/6]
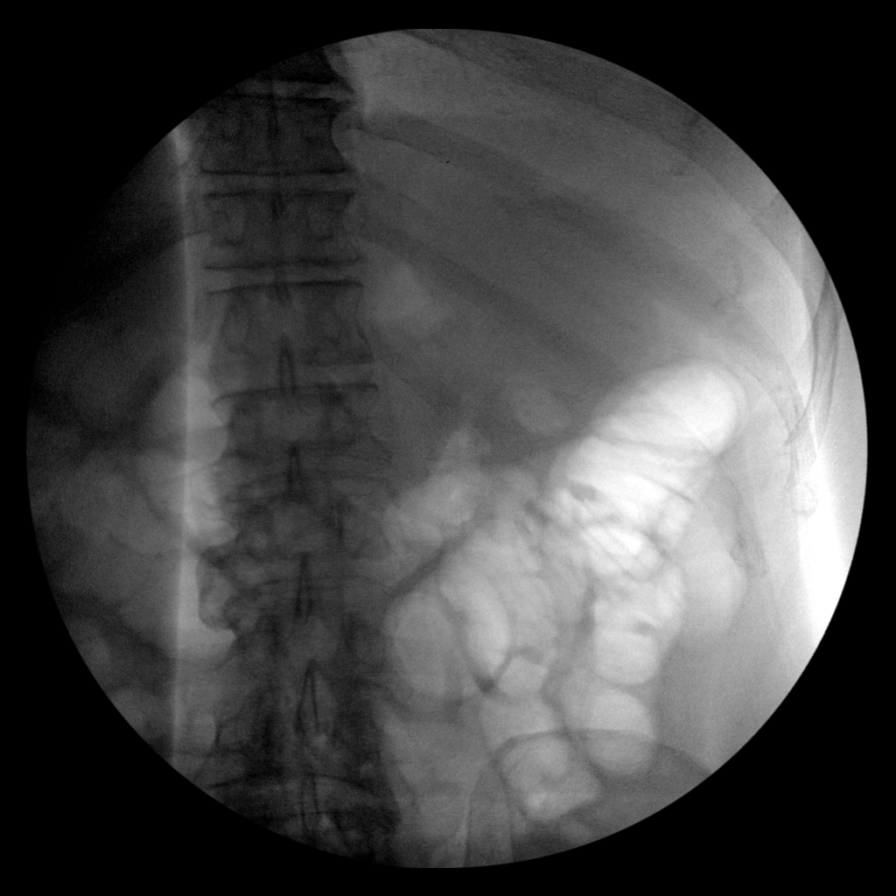

[6 of 6 positions shown; findings below may reference images not displayed]

FINDINGS: Multiple, limited oblique planar images of the RIGHT upper quadrant
demonstrating endoscopy and common bile duct cannulation.
IMPRESSION: Fluoroscopic imaging for ERCP.

Please see performing service dictation for complete description of
intraprocedural findings.

## 2020-11-11 SURGERY — ENDOSCOPIC RETROGRADE CHOLANGIOPANCREATOGRAPHY (ERCP) WITH PROPOFOL
Anesthesia: General

## 2020-11-11 SURGERY — ERCP, WITH INTERVENTION IF INDICATED
Anesthesia: General

## 2020-11-11 MED ORDER — EPINEPHRINE 1 MG/10ML IJ SOSY
PREFILLED_SYRINGE | INTRAMUSCULAR | Status: DC | PRN
  Administered 2020-11-11: .4 mg via INTRAVENOUS

## 2020-11-11 MED ORDER — ROCURONIUM BROMIDE 10 MG/ML (PF) SYRINGE
PREFILLED_SYRINGE | INTRAVENOUS | Status: DC | PRN
  Administered 2020-11-11: 60 mg via INTRAVENOUS

## 2020-11-11 MED ORDER — PROPOFOL 10 MG/ML IV BOLUS
INTRAVENOUS | Status: AC
  Filled 2020-11-11: qty 20

## 2020-11-11 MED ORDER — ONDANSETRON HCL 4 MG/2ML IJ SOLN
INTRAMUSCULAR | Status: DC | PRN
Start: 2020-11-11 — End: 2020-11-11
  Administered 2020-11-11: 4 mg via INTRAVENOUS

## 2020-11-11 MED ORDER — PANTOPRAZOLE SODIUM 40 MG IV SOLR
40.0000 mg | Freq: Two times a day (BID) | INTRAVENOUS | Status: DC
  Administered 2020-11-11 – 2020-11-13 (×4): 40 mg via INTRAVENOUS
  Filled 2020-11-11 (×4): qty 40

## 2020-11-11 MED ORDER — GLUCAGON HCL RDNA (DIAGNOSTIC) 1 MG IJ SOLR
INTRAMUSCULAR | Status: AC
  Filled 2020-11-11: qty 1

## 2020-11-11 MED ORDER — SODIUM CHLORIDE 0.9 % IV SOLN
INTRAVENOUS | Status: DC | PRN
  Administered 2020-11-11: 75 mL

## 2020-11-11 MED ORDER — CIPROFLOXACIN IN D5W 400 MG/200ML IV SOLN
INTRAVENOUS | Status: DC | PRN
  Administered 2020-11-11: 400 mg via INTRAVENOUS

## 2020-11-11 MED ORDER — GLUCAGON HCL RDNA (DIAGNOSTIC) 1 MG IJ SOLR
INTRAMUSCULAR | Status: DC | PRN
  Administered 2020-11-11 (×7): .25 mg via INTRAVENOUS

## 2020-11-11 MED ORDER — SODIUM CHLORIDE 0.9 % IV SOLN: INTRAVENOUS | Status: DC

## 2020-11-11 MED ORDER — SUGAMMADEX SODIUM 200 MG/2ML IV SOLN
INTRAVENOUS | Status: DC | PRN
  Administered 2020-11-11: 150 mg via INTRAVENOUS

## 2020-11-11 MED ORDER — INDOMETHACIN 50 MG RE SUPP
RECTAL | Status: DC | PRN
  Administered 2020-11-11: 100 mg via RECTAL

## 2020-11-11 MED ORDER — FENTANYL CITRATE (PF) 100 MCG/2ML IJ SOLN
INTRAMUSCULAR | Status: AC
  Filled 2020-11-11: qty 2

## 2020-11-11 MED ORDER — PHENYLEPHRINE 40 MCG/ML (10ML) SYRINGE FOR IV PUSH (FOR BLOOD PRESSURE SUPPORT)
PREFILLED_SYRINGE | INTRAVENOUS | Status: DC | PRN
  Administered 2020-11-11: 120 ug via INTRAVENOUS
  Administered 2020-11-11: 80 ug via INTRAVENOUS
  Administered 2020-11-11: 40 ug via INTRAVENOUS
  Administered 2020-11-11 (×2): 80 ug via INTRAVENOUS
  Administered 2020-11-11: 120 ug via INTRAVENOUS

## 2020-11-11 MED ORDER — PROPOFOL 10 MG/ML IV BOLUS
INTRAVENOUS | Status: DC | PRN
  Administered 2020-11-11: 150 mg via INTRAVENOUS

## 2020-11-11 MED ORDER — FENTANYL CITRATE (PF) 100 MCG/2ML IJ SOLN
INTRAMUSCULAR | Status: DC | PRN
  Administered 2020-11-11 (×2): 50 ug via INTRAVENOUS

## 2020-11-11 MED ORDER — LIDOCAINE 2% (20 MG/ML) 5 ML SYRINGE
INTRAMUSCULAR | Status: DC | PRN
  Administered 2020-11-11: 60 mg via INTRAVENOUS

## 2020-11-11 MED ORDER — INDOMETHACIN 50 MG RE SUPP
RECTAL | Status: AC
  Filled 2020-11-11: qty 2

## 2020-11-11 MED ORDER — INDOMETHACIN 50 MG RE SUPP: 100.0000 mg | Freq: Once | RECTAL | Status: DC

## 2020-11-11 MED ORDER — DEXAMETHASONE SODIUM PHOSPHATE 4 MG/ML IJ SOLN
INTRAMUSCULAR | Status: DC | PRN
  Administered 2020-11-11: 4 mg via INTRAVENOUS

## 2020-11-11 MED ORDER — EPHEDRINE SULFATE-NACL 50-0.9 MG/10ML-% IV SOSY
PREFILLED_SYRINGE | INTRAVENOUS | Status: DC | PRN
  Administered 2020-11-11: 10 mg via INTRAVENOUS
  Administered 2020-11-11: 5 mg via INTRAVENOUS
  Administered 2020-11-11: 10 mg via INTRAVENOUS

## 2020-11-11 MED ORDER — CIPROFLOXACIN IN D5W 400 MG/200ML IV SOLN
INTRAVENOUS | Status: AC
  Filled 2020-11-11: qty 200

## 2020-11-11 NOTE — Interval H&P Note (Signed)
History and Physical Interval Note:  11/11/2020 1:02 PM  Francisco Frank  has presented today for surgery, with the diagnosis of pancreatic lesion CBD obstruction.  The various methods of treatment have been discussed with the patient and family. After consideration of risks, benefits and other options for treatment, the patient has consented to  Procedure(s): ENDOSCOPIC RETROGRADE CHOLANGIOPANCREATOGRAPHY (ERCP) WITH PROPOFOL (N/A) UPPER ENDOSCOPIC ULTRASOUND (EUS) RADIAL (N/A) as a surgical intervention.  The patient's history has been reviewed, patient examined, no change in status, stable for surgery.  I have reviewed the patient's chart and labs.  Questions were answered to the patient's satisfaction.    The risks of an EUS including intestinal perforation, bleeding, infection, aspiration, and medication effects were discussed as was the possibility it may not give a definitive diagnosis if a biopsy is performed.  When a biopsy of the pancreas is done as part of the EUS, there is an additional risk of pancreatitis at the rate of about 1-2%.  It was explained that procedure related pancreatitis is typically mild, although it can be severe and even life threatening, which is why we do not perform random pancreatic biopsies and only biopsy a lesion/area we feel is concerning enough to warrant the risk.  The risks of an ERCP were discussed at length, including but not limited to the risk of perforation, bleeding, abdominal pain, post-ERCP pancreatitis (while usually mild can be severe and even life threatening).   [employer]

## 2020-11-11 NOTE — Anesthesia Preprocedure Evaluation (Signed)
Anesthesia Evaluation  Patient identified by MRN, date of birth, ID band Patient awake    Reviewed: Allergy & Precautions, NPO status , Patient's Chart, lab work & pertinent test results  History of Anesthesia Complications Negative for: history of anesthetic complications  Airway Mallampati: II  TM Distance: >3 FB Neck ROM: Full    Dental  (+) Dental Advisory Given, Teeth Intact   Pulmonary sleep apnea ,    Pulmonary exam normal        Cardiovascular hypertension, Pt. on medications and Pt. on home beta blockers + CAD  Normal cardiovascular exam     Neuro/Psych negative neurological ROS     GI/Hepatic Neg liver ROS, Pancreatic mass w/ CBD obstruction   Endo/Other  diabetes, Type 2, Insulin Dependent  Renal/GU negative Renal ROS  negative genitourinary   Musculoskeletal negative musculoskeletal ROS (+)   Abdominal   Peds  Hematology  (+) anemia ,   Anesthesia Other Findings   Reproductive/Obstetrics                            Anesthesia Physical Anesthesia Plan  ASA: 3  Anesthesia Plan: General   Post-op Pain Management:    Induction: Intravenous  PONV Risk Score and Plan: 2 and Ondansetron, Dexamethasone, Treatment may vary due to age or medical condition and Midazolam  Airway Management Planned: Oral ETT  Additional Equipment: None  Intra-op Plan:   Post-operative Plan: Extubation in OR  Informed Consent: I have reviewed the patients History and Physical, chart, labs and discussed the procedure including the risks, benefits and alternatives for the proposed anesthesia with the patient or authorized representative who has indicated his/her understanding and acceptance.     Dental advisory given  Plan Discussed with:   Anesthesia Plan Comments:         Anesthesia Quick Evaluation

## 2020-11-11 NOTE — Anesthesia Procedure Notes (Signed)
Procedure Name: Intubation Date/Time: 11/11/2020 1:32 PM Performed by: Eulas Post, Stacie Templin W, CRNA Pre-anesthesia Checklist: Patient identified, Emergency Drugs available, Suction available and Patient being monitored Patient Re-evaluated:Patient Re-evaluated prior to induction Oxygen Delivery Method: Circle system utilized Preoxygenation: Pre-oxygenation with 100% oxygen Induction Type: IV induction Ventilation: Mask ventilation without difficulty Laryngoscope Size: Miller and 2 Grade View: Grade I Tube type: Oral Tube size: 7.5 mm Number of attempts: 1 Airway Equipment and Method: Stylet Placement Confirmation: ETT inserted through vocal cords under direct vision, positive ETCO2 and breath sounds checked- equal and bilateral Secured at: 23 cm Tube secured with: Tape Dental Injury: Teeth and Oropharynx as per pre-operative assessment

## 2020-11-11 NOTE — Progress Notes (Signed)
Patient ID: Francisco Frank, male   DOB: September 16, 1950, 70 y.o.   MRN: [id number]    Progress Note   Subjective   Day # 2 CC; obstructive jaundice  CA 19-9 pending LFTs pending  Rested a little bit better last evening with use of Xanax, able to get at least 4 hours of sleep.  He does feel that the Zoloft has helped a bit with the pruritus but itching has increased this morning.  No complaints of abdominal pain or nausea. Remains afebrile   Objective   Vital signs in last 24 hours: Temp:  [97.9 F (36.6 C)-98.3 F (36.8 C)] 98.3 F (36.8 C) (08/23 0509) Pulse Rate:  [64-68] 64 (08/23 0509) Resp:  [18] 18 (08/23 0509) BP: (111-130)/(67-82) 127/74 (08/23 0509) SpO2:  [96 %-99 %] 96 % (08/23 0509) Weight:  [70.1 kg] 70.1 kg (08/23 0500) Last BM Date: 11/09/20 (per pt) General:    Jaundiced older white male in NAD Heart:  Regular rate and rhythm; no murmurs Lungs: Respirations even and unlabored, lungs CTA bilaterally Abdomen:  Soft, nontender and nondistended. Normal bowel sounds. Extremities:  Without edema. Neurologic:  Alert and oriented,  grossly normal neurologically. Psych:  Cooperative. Normal mood and affect.  Intake/Output from previous day: 08/22 0701 - 08/23 0700 In: 2057.2 [P.O.:1182; I.V.:875.2] Out: -  Intake/Output this shift: No intake/output data recorded.  Lab Results: Recent Labs    11/09/20 1600 11/10/20 0445  WBC 8.1 8.2  HGB 12.8* 11.1*  HCT 36.4* 31.9*  PLT 292 262   BMET Recent Labs    11/09/20 1600 11/10/20 0445  NA 134* 137  K 3.9 3.4*  CL 101 103  CO2 23 25  GLUCOSE 285* 148*  BUN 20 20  CREATININE 1.04 0.99  CALCIUM 9.8 9.2   LFT Recent Labs    11/10/20 0445  PROT 6.3*  ALBUMIN 3.0*  AST 61*  ALT 63*  ALKPHOS 318*  BILITOT 14.5*   PT/INR Recent Labs    11/10/20 1019  LABPROT 12.1  INR 0.9         Assessment / Plan:    #24 70 year old white male  Admitted itted with obstructive jaundice and pancreatic  head mass on MRI.  Planned admission for ERCP, sphincterotomy and stent placement as well as EUS with biopsy. Procedures delayed 1 day to allow 5-day Plavix washout  Patient is scheduled for procedures with Dr. Rush Landmark  today  Remains clinically stable with no changes  CA 19-9 pending  #2 pruritus secondary to above-continue Zoloft Hopefully with stent placement and drainage pruritus will improve over the next few days  #3 new onset diabetes-requiring insulin #4 coronary artery disease-on chronic Plavix currently on hold #5 hypertension    Principal Problem:   Pancreatic mass Active Problems:   Hyperbilirubinemia   Cholestatic pruritus   HTN (hypertension)   Type 2 diabetes mellitus without complication, with long-term current use of insulin ([place])   CAD in native artery     LOS: 0 days   Francisco Corrigan  PA-C8/23/2022, 8:41 AM

## 2020-11-11 NOTE — Anesthesia Postprocedure Evaluation (Signed)
Anesthesia Post Note  Patient: Francisco Frank  Procedure(s) Performed: ENDOSCOPIC RETROGRADE CHOLANGIOPANCREATOGRAPHY (ERCP) WITH PROPOFOL UPPER ENDOSCOPIC ULTRASOUND (EUS) RADIAL ESOPHAGOGASTRODUODENOSCOPY (EGD) WITH PROPOFOL FINE NEEDLE ASPIRATION BIOPSY SPHINCTEROTOMY HEMOSTASIS CONTROL     Patient location during evaluation: PACU Anesthesia Type: General Level of consciousness: awake and alert Pain management: pain level controlled Vital Signs Assessment: post-procedure vital signs reviewed and stable Respiratory status: spontaneous breathing, nonlabored ventilation and respiratory function stable Cardiovascular status: blood pressure returned to baseline and stable Postop Assessment: no apparent nausea or vomiting Anesthetic complications: no   No notable events documented.  Last Vitals:  Vitals:   11/11/20 1620 11/11/20 1630  BP: (!) 145/93 (!) 148/92  Pulse: 76 69  Resp: 17 12  Temp:    SpO2: 100% 97%    Last Pain:  Vitals:   11/11/20 1630  TempSrc:   PainSc: 0-No pain                 Lidia Collum

## 2020-11-11 NOTE — Op Note (Signed)
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 Estimated Blood Loss:     Estimated blood loss was minimal. Procedure:                Pre-Anesthesia Assessment:                           - Prior to the procedure, a History and Physical                            was performed, and patient medications and                            allergies were reviewed. The patient's tolerance of                            previous anesthesia was also reviewed. The risks                            and benefits of the procedure and the sedation                            options and risks were discussed with the patient.                            All questions were answered, and informed consent                            was obtained. Prior Anticoagulants: The patient has                            taken Plavix (clopidogrel), last dose was 4 days                            prior to procedure. ASA Grade Assessment: III - A  patient with severe systemic disease. After                            reviewing the risks and benefits, the patient was                            deemed in satisfactory condition to undergo the                            procedure.                           After obtaining informed consent, the endoscope was                            passed under direct  vision. Throughout the                            procedure, the patient's blood pressure, pulse, and                            oxygen saturations were monitored continuously. The                            [id number] Olympus endoscope was introduced                            through the mouth, and advanced to the second part                            of duodenum. The [id number] Olympus linear                            ultrasound scope was introduced through the mouth,                            and advanced to the duodenum for ultrasound                            examination from the stomach and duodenum. The                            upper EUS was accomplished without difficulty. The                            patient tolerated the procedure. Scope In: Scope Out: Findings:      ENDOSCOPIC FINDING: :      No gross lesions were noted in the entire esophagus.      The Z-line was regular and was found 36 cm from the incisors.      A 1 cm hiatal hernia was present.      Scattered moderate inflammation characterized by erosions, erythema and       granularity was found in the entire examined stomach. Biopsies were       taken with  a cold forceps for histology and Helicobacter pylori testing.      Localized severe mucosal changes characterized by congestion, erythema,       inflammation and altered texture were found in the D1/D2 sweep.      No gross lesions were noted in the duodenal bulb.      Mucosal changes characterized by congestion were found at the major       papilla.      ENDOSONOGRAPHIC FINDING: :      An irregular mass was identified in the pancreatic head. The mass was       hypoechoic. The mass measured 26 mm by 30 mm in maximal cross-sectional       diameter. The outer margins were irregular. There was sonographic       evidence suggesting invasion into the portal vein (manifested by       abutment) and the superior mesenteric vein (manifested by  abutment). An       intact interface was seen between the mass and the superior mesenteric       artery and celiac trunk suggesting a lack of invasion. The remainder of       the pancreas was examined. The endosonographic appearance showed       evidence of atrophic parenchyma and the upstream pancreatic duct       indicated duct dilation (PDH - 10.9 mm, PDB - 3.9 mm, PDT - 3.8 mm) and       dilated side-branches. Fine needle biopsy was performed of the mass.       Color Doppler imaging was utilized prior to needle puncture to confirm a       lack of significant vascular structures within the needle path. Five       passes were made with the Acquire 22 gauge ultrasound core biopsy needle       using a transduodenal approach. Visible cores of tissue were obtained.       Preliminary cytologic examination and touch preps were performed. Final       cytology results are pending.      There was dilation in the common bile duct and in the common hepatic       duct.      No malignant-appearing lymph nodes were visualized in the celiac region       (level 20), peripancreatic region and porta hepatis region.      Endosonographic imaging in the visualized portion of the liver showed no       mass.      The celiac region was visualized. Impression:               EGD Impression:                           - No gross lesions in esophagus. Z-line regular, 36                            cm from the incisors.                           - 1 cm hiatal hernia.                           - Gastritis. Biopsied.                           -  Mucosal changes in the duodenum.                           - No gross lesions in the duodenal bulb.                           - Mucosal changes in the duodenum sweep.                           EUS Impression:                           - A mass was identified in the pancreatic head.                            Tissue was obtained from this exam, and results are                             pending. 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 This was                            staged T2 N0 Mx by endosonographic criteria. The                            staging applies if malignancy is confirmed. Fine                            needle biopsy performed.                           - There was dilation in the common bile duct and in                            the common hepatic duct.                           - No malignant-appearing lymph nodes were                            visualized in the celiac region (level 20),                            peripancreatic region and porta hepatis region. Moderate Sedation:      Not Applicable - Patient had care per Anesthesia. Recommendation:           - Proceed to scheduled ERCP attempt.                           - Await cytology results, await path results and                            await tumor markers.                           -  CT-Chest and CT-Pelvis will need to be considered                            for final staging when pathology returns.                           - Oncology/Surgical Oncology referral will need to                            be placed when pathology returns.                           - The findings and recommendations were discussed                            with the patient.                           - The findings and recommendations were discussed                            with the patient's family.                           - The findings and recommendations were discussed                            with the referring physician. Procedure Code(s):        --- Professional ---                           [phone], Esophagogastroduodenoscopy, flexible,                            transoral; with transendoscopic ultrasound-guided                            intramural or transmural fine needle                            aspiration/biopsy(s),  (includes endoscopic                            ultrasound examination limited to the esophagus,                            stomach or duodenum, and adjacent structures) Diagnosis Code(s):        --- Professional ---                           K44.9, Diaphragmatic hernia without obstruction or                            gangrene                           K29.70, Gastritis, unspecified, without  bleeding                           K31.89, Other diseases of stomach and duodenum                           K86.89, Other specified diseases of pancreas                           I89.9, Noninfective disorder of lymphatic vessels                            and lymph nodes, unspecified                           K83.8, Other specified diseases of biliary tract                           R93.3, Abnormal findings on diagnostic imaging of                            other parts of digestive tract CPT copyright 2019 [employer]. All rights reserved. The codes documented in this report are preliminary and upon coder review may  be revised to meet current compliance requirements. Justice Britain, MD 11/11/2020 4:56:15 PM Number of Addenda: 0

## 2020-11-11 NOTE — Transfer of Care (Signed)
Immediate Anesthesia Transfer of Care Note  Patient: Francisco Frank  Procedure(s) Performed: ENDOSCOPIC RETROGRADE CHOLANGIOPANCREATOGRAPHY (ERCP) WITH PROPOFOL UPPER ENDOSCOPIC ULTRASOUND (EUS) RADIAL ESOPHAGOGASTRODUODENOSCOPY (EGD) WITH PROPOFOL FINE NEEDLE ASPIRATION BIOPSY SPHINCTEROTOMY HEMOSTASIS CONTROL  Patient Location: PACU  Anesthesia Type:General  Level of Consciousness: awake, alert  and oriented  Airway & Oxygen Therapy: Patient Spontanous Breathing and Patient connected to face mask oxygen  Post-op Assessment: Report given to RN and Post -op Vital signs reviewed and stable  Post vital signs: Reviewed and stable  Last Vitals:  Vitals Value Taken Time  BP    Temp    Pulse 77 11/11/20 1614  Resp 11 11/11/20 1614  SpO2 100 % 11/11/20 1614  Vitals shown include unvalidated device data.  Last Pain:  Vitals:   11/11/20 1149  TempSrc: Oral  PainSc: 0-No pain         Complications: No notable events documented.

## 2020-11-11 NOTE — Progress Notes (Signed)
PROGRESS NOTE    Francisco Frank  [id number] DOB: 1950-09-10 DOA: 11/09/2020 PCP: Kennieth Rad, MD   Brief Narrative: 70 year old male with history of hypertension who has had GI evaluation found to have pancreatic mass suspected neoplasm and admitted for ERCP and EUS biopsy. He was recently seen by GI for 20 pound weight loss since April and labs showed normal liver enzyme and further work-up showed minimal stranding at pancreatic head cholelithiasis and continue to have weight loss and will continue to follow-up with GI and back in August beginning he had abnormal LFTs MRI of the abdomen showed pancreatic body mass. He was directly admitted for further work-up by GI.  Subjective: Seen examined.  No new complaints..  Assessment & Plan:  Abnormal LFTs Pancreatic mass suspecting neoplasm:  followed by GI planning for ERCP with a sphincterotomy and stent placement as well as EUS with biopsy today after holding Plavix-for 5-day washout.Marland Kitchen  Keep NPO.  CA 19-9 pending.  Continue gentle IV fluids.  Hypokalemia it was repleted. Recent Labs  Lab 11/06/20 1156 11/09/20 1600 11/10/20 0445 11/11/20 0753  K 4.0 3.9 3.4* 3.7    Anemia likely from chronic disease.  Stable overall.  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  Atarax not effective.  Continue symptomatic management.  Essential hypertension BP stable on Norvasc.  Atenolol remains on hold.    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  Blood sugar remains stable.   Recent Labs  Lab 11/10/20 0746 11/10/20 1147 11/10/20 1635 11/10/20 2042 11/11/20 0827  GLUCAP 151* 149* 184* 179* 180*    CAD in native artery: Plavix remains on hold due to procedure.  Resume once okay with GI.  Diet Order             Diet NPO time specified  Diet effective midnight                  Patient's Body mass index is 22.82 kg/m. DVT prophylaxis: SCDs Start: 11/09/20 1636 Code  Status:   Code Status: Full Code  Family Communication: plan of care discussed with patient at bedside. Status is: Admitted as observation. Patient remains hospitalized for ongoing management of pancreatic mass-needing holding of his Plavix and further procedure GI evaluation and ERCP/EUS and will need close observation and will change to inpatient. Dispo: The patient is from: Home              Anticipated d/c is to: Home              Patient currently is not medically stable to d/c.   Difficult to place patient No  Unresulted Labs (From admission, onward)     Start     Ordered   11/12/20 0500  CBC  Tomorrow morning,   R        11/11/20 1015   11/12/20 0500  Comprehensive metabolic panel  Tomorrow morning,   R        11/11/20 1015   11/11/20 0500  Cancer antigen 19-9  Tomorrow morning,   R        11/10/20 1604           Medications reviewed:  Scheduled Meds:  amLODipine  5 mg Oral Daily   insulin aspart  0-5 Units Subcutaneous QHS   insulin aspart  0-9 Units Subcutaneous TID WC   insulin glargine-yfgn  6 Units Subcutaneous QHS   sertraline  100 mg Oral Daily  sodium chloride flush  3 mL Intravenous Q12H   Continuous Infusions:  sodium chloride     lactated ringers 75 mL/hr at 11/11/20 0232   Consultants:see note  Procedures:see note Antimicrobials: Anti-infectives (From admission, onward)    None      Culture/Microbiology No results found for: SDES, SPECREQUEST, CULT, REPTSTATUS  Other culture-see note  Objective: Vitals: Today's Vitals   11/10/20 2037 11/11/20 0500 11/11/20 0509 11/11/20 0810  BP: 130/73  127/74   Pulse: 68  64   Resp: 18  18   Temp: 97.9 F (36.6 C)  98.3 F (36.8 C)   TempSrc: Oral  Oral   SpO2: 98%  96%   Weight:  70.1 kg    Height:      PainSc:    0-No pain    Intake/Output Summary (Last 24 hours) at 11/11/2020 1127 Last data filed at 11/10/2020 1900 Gross per 24 hour  Intake 1702.21 ml  Output --  Net 1702.21 ml    Filed  Weights   11/09/20 2353 11/10/20 0500 11/11/20 0500  Weight: 72.5 kg 72.5 kg 70.1 kg   Weight change: -2.4 kg  Intake/Output from previous day: 08/22 0701 - 08/23 0700 In: 2057.2 [P.O.:1182; I.V.:875.2] Out: -  Intake/Output this shift: No intake/output data recorded. Filed Weights   11/09/20 2353 11/10/20 0500 11/11/20 0500  Weight: 72.5 kg 72.5 kg 70.1 kg   Examination: General exam: AAOx 3 older than stated age, weak appearing. HEENT:Oral mucosa moist, icterus+,ear/Nose WNL grossly, dentition normal. Respiratory system: bilaterally diminished, no use of accessory muscle Cardiovascular system: S1 & S2 +, No JVD,. Gastrointestinal system: Abdomen soft, NT,ND, BS+ Nervous System:Alert, awake, moving extremities and grossly nonfocal Extremities: no edema, distal peripheral pulses palpable.  Skin: No rashes,no icterus. MSK: Normal muscle bulk,tone, power  Data Reviewed: I have personally reviewed following labs and imaging studies CBC: Recent Labs  Lab 11/09/20 1600 11/10/20 0445  WBC 8.1 8.2  NEUTROABS 4.7 4.5  HGB 12.8* 11.1*  HCT 36.4* 31.9*  MCV 86.1 86.4  PLT 292 [id number]    Basic Metabolic Panel: Recent Labs  Lab 11/06/20 1156 11/09/20 1600 11/10/20 0445 11/11/20 0753  NA 136 134* 137 133*  K 4.0 3.9 3.4* 3.7  CL 101 101 103 101  CO2 '24 23 25 23  '$ GLUCOSE 252* 285* 148* 177*  BUN '23 20 20 16  '$ CREATININE 1.41 1.04 0.99 0.80  CALCIUM 9.8 9.8 9.2 9.2  MG  --  2.0  --   --     GFR: Estimated Creatinine Clearance: 84 mL/min (by C-G formula based on SCr of 0.8 mg/dL). Liver Function Tests: Recent Labs  Lab 11/06/20 1156 11/09/20 1600 11/10/20 0445 11/11/20 0753  AST 79* 74* 61* 58*  ALT 83* 75* 63* 58*  ALKPHOS 447* 389* 318* 311*  BILITOT 14.2* 16.4* 14.5* 15.6*  PROT 7.0 7.5 6.3* 5.3*  ALBUMIN 3.9 3.7 3.0* 2.6*    Recent Labs  Lab 11/11/20 0753  LIPASE 18   No results for input(s): AMMONIA in the last 168 hours. Coagulation Profile: Recent  Labs  Lab 11/10/20 1019  INR 0.9    Cardiac Enzymes: No results for input(s): CKTOTAL, CKMB, CKMBINDEX, TROPONINI in the last 168 hours. BNP (last 3 results) No results for input(s): PROBNP in the last 8760 hours. HbA1C: Recent Labs    11/09/20 1600  HGBA1C 9.6*    CBG: Recent Labs  Lab 11/10/20 0746 11/10/20 1147 11/10/20 1635 11/10/20 2042 11/11/20 0827  GLUCAP 151*  149* 184* 179* 180*    Lipid Profile: No results for input(s): CHOL, HDL, LDLCALC, TRIG, CHOLHDL, LDLDIRECT in the last 72 hours. Thyroid Function Tests: No results for input(s): TSH, T4TOTAL, FREET4, T3FREE, THYROIDAB in the last 72 hours. Anemia Panel: No results for input(s): VITAMINB12, FOLATE, FERRITIN, TIBC, IRON, RETICCTPCT in the last 72 hours. Sepsis Labs: No results for input(s): PROCALCITON, LATICACIDVEN in the last 168 hours.  Recent Results (from the past 240 hour(s))  SARS CORONAVIRUS 2 (TAT 6-24 HRS) Nasopharyngeal Nasopharyngeal Swab     Status: None   Collection Time: 11/09/20  5:15 PM   Specimen: Nasopharyngeal Swab  Result Value Ref Range Status   SARS Coronavirus 2 NEGATIVE NEGATIVE Final    Comment: (NOTE) SARS-CoV-2 target nucleic acids are NOT DETECTED.  The SARS-CoV-2 RNA is generally detectable in upper and lower respiratory specimens during the acute phase of infection. Negative results do not preclude SARS-CoV-2 infection, do not rule out co-infections with other pathogens, and should not be used as the sole basis for treatment or other patient management decisions. Negative results must be combined with clinical observations, patient history, and epidemiological information. The expected result is Negative.  Fact Sheet for Patients: SugarRoll.be  Fact Sheet for Healthcare Providers: https://www.woods-mathews.com/  This test is not yet approved or cleared by the [place] FDA and  has been authorized for detection and/or  diagnosis of SARS-CoV-2 by FDA under an Emergency Use Authorization (EUA). This EUA will remain  in effect (meaning this test can be used) for the duration of the COVID-19 declaration under Se ction 564(b)(1) of the Act, 21 U.S.C. section 360bbb-3(b)(1), unless the authorization is terminated or revoked sooner.  Performed at [facility], [place] [address] [place], [place] [address]       Radiology Studies: No results found.   LOS: 0 days   Antonieta Pert, MD [facility]  11/11/2020, 11:27 AM

## 2020-11-11 NOTE — Op Note (Addendum)
[facility] Patient Name: Francisco Frank Procedure Date: 11/11/2020 MRN: [id number] Attending MD: Justice Britain , MD Date of Birth: 08-09-1950 CSN: [id number] Age: 70 Admit Type: Inpatient Procedure:                ERCP Indications:              Abnormal MRCP, Jaundice, Abnormal liver function                            test, Tumor of the head of pancreas Providers:                Justice Britain, MD, Carlyn Reichert, RN, Tyna Jaksch Technician Referring MD:             Gerrit Heck, MD, [facility] Medicines:                General Anesthesia, Cipro 400 mg IV, Indomethacin                            100 mg PR, Glucagon 7.82 mg IV Complications:            No immediate complications. Estimated Blood Loss:     Estimated blood loss was minimal. Procedure:                Pre-Anesthesia Assessment:                           - Prior to the procedure, a History and Physical                            was performed, and patient medications and                            allergies were reviewed. The patient's tolerance of                            previous anesthesia was also reviewed. The risks                            and benefits of the procedure and the sedation                            options and risks were discussed with the patient.                            All questions were answered, and informed consent                            was obtained. Prior Anticoagulants: The patient has                            taken Plavix (clopidogrel), last dose was 4 days  prior to procedure. ASA Grade Assessment: III - A                            patient with severe systemic disease. After                            reviewing the risks and benefits, the patient was                            deemed in satisfactory condition to undergo the                            procedure.                            After obtaining informed consent, the scope was                            passed under direct vision. Throughout the                            procedure, the patient's blood pressure, pulse, and                            oxygen saturations were monitored continuously. The                            TJF-Q180V ([id number]) Olympus duodenoscope was                            introduced through the mouth, and used to inject                            contrast into without successful cannulation. The                            ERCP was accomplished without difficulty. The                            patient tolerated the procedure. Scope In: Scope Out: Findings:      The scout film was normal.      The esophagus was successfully intubated under direct vision without       detailed examination of the pharynx, larynx, and associated structures,       and upper GI tract. The upper GI tract was described in the EUS. The       duodenal sweep has significant congestion and required patient to be       positioned in semi-left lateral positioning to allow scope to enter and       then be placed back into semi-prone position with subsequent       fluroroscopy to allow slow passage of the endoscope into the D2 region.       The major papilla was congested and distorted and angulated laterally.      The bile duct could not be cannulated with the Revolution Jagtome  sphincterotome using angled and straight 0.025 wires. Repositioning into       a semi-long position allowed improved access to the intraduodenal       portion of the ampulla. A wire did go into what was initially felt to be       the pancreatic duct, but after injection, it was found that an       intramural injection was made. After more than an hour attempting to       enter the biliary tree without success, decision made to pursue a       biliary pre-cut fistulotomy. I extended this downwards from about 10 mm       above the ampulla  towards the major papilla using a monofilament needle       knife using freehand technque and using ERBE electrocautery. I did not       see any bile leakage. I proceeded with precut sphincterotomy upwards at       the ampullary region to attempt to access the biliary tree without       success. I then further extended the fistulotomy upwards on the       intraduodenal portion up another 5-6 mm for a 16 mm total length with a       monofilament needle knife using a freehand technique using ERBE       electrocautery. Minor bleeding from the fistulotomy/sphincterotomy was       noted. This was successfully treated with further cautery/coagulation       and subsequent injection of 4 mL of a 1:100,000 solution of epinephrine       through the ERCP scope for hemostasis.      Further attempts at bile duct cannulation were made with the       sphincterotome and with the needle-kinfe using the angled wire, but I       could not access the biliary tree. The bile duct could not be cannulated.      The duodenoscope was withdrawn from the patient. Impression:               - The major papilla appeared congested and                            distorted with lateral angulation.                           - Typical ERCP techniques not able to access                            biliary tree.                           - Needle-knife sphincterotomy/fistulotomy                            performed. Minor bleeding requiring Epinephrine                            injection and further coagulation perfored with                            good success.                           -  Unfortunately, unable to obtain biliary access.                           - Procedure stopped. Moderate Sedation:      Not Applicable - Patient had care per Anesthesia. Recommendation:           - The patient will be observed post-procedure,                            until all discharge criteria are met.                           -  Return patient to hospital ward for ongoing care.                           - Full liquid diet today.                           - Observe patient's clinical course.                           - Start PPI IV BID this afternoon.                           - Watch for pancreatitis, bleeding, perforation,                            and cholangitis.                           - If concern of hemodynamic instability with                            melena/hematochezia or hematemesis, then would                            recommend CT-Angiography as there could be                            sphincterotomy bleeding and discuss results                            overnight with VIR.                           - I have discussed this case with VIR for attempt                            at PBD placement tomorrow vs the next day.                           - The findings and recommendations were discussed                            with the patient.                           -  The findings and recommendations were discussed                            with the referring physician. Procedure Code(s):        --- Professional ---                           [phone], Esophagogastroduodenoscopy, flexible,                            transoral; with control of bleeding, any method Diagnosis Code(s):        --- Professional ---                           R17, Unspecified jaundice                           R94.5, Abnormal results of liver function studies                           D49.0, Neoplasm of unspecified behavior of                            digestive system                           K83.8, Other specified diseases of biliary tract                           R93.2, Abnormal findings on diagnostic imaging of                            liver and biliary tract CPT copyright 2019 American Medical Association. All rights reserved. The codes documented in this report are preliminary and upon coder review may  be  revised to meet current compliance requirements. Justice Britain, MD 11/11/2020 4:24:58 PM Number of Addenda: 0

## 2020-11-12 ENCOUNTER — Encounter (HOSPITAL_COMMUNITY): Payer: Self-pay | Admitting: Gastroenterology

## 2020-11-12 ENCOUNTER — Inpatient Hospital Stay (HOSPITAL_COMMUNITY): Payer: Medicare Other

## 2020-11-12 DIAGNOSIS — R945 Abnormal results of liver function studies: Secondary | ICD-10-CM

## 2020-11-12 DIAGNOSIS — K8689 Other specified diseases of pancreas: Secondary | ICD-10-CM | POA: Diagnosis not present

## 2020-11-12 DIAGNOSIS — K831 Obstruction of bile duct: Secondary | ICD-10-CM | POA: Diagnosis not present

## 2020-11-12 HISTORY — PX: IR PERCUTANEOUS TRANSHEPATIC CHOLANGIOGRAM: IMG6042

## 2020-11-12 LAB — COMPREHENSIVE METABOLIC PANEL
ALT: 52 U/L — ABNORMAL HIGH (ref 0–44)
AST: 62 U/L — ABNORMAL HIGH (ref 15–41)
Albumin: 2.7 g/dL — ABNORMAL LOW (ref 3.5–5.0)
Alkaline Phosphatase: 311 U/L — ABNORMAL HIGH (ref 38–126)
Anion gap: 10 (ref 5–15)
BUN: 21 mg/dL (ref 8–23)
CO2: 24 mmol/L (ref 22–32)
Calcium: 8.9 mg/dL (ref 8.9–10.3)
Chloride: 101 mmol/L (ref 98–111)
Creatinine, Ser: 0.94 mg/dL (ref 0.61–1.24)
GFR, Estimated: >60 mL/min (ref >60)
Glucose, Bld: 199 mg/dL — ABNORMAL HIGH (ref 70–99)
Potassium: 3.8 mmol/L (ref 3.5–5.1)
Sodium: 135 mmol/L (ref 135–145)
Total Bilirubin: 16.5 mg/dL — ABNORMAL HIGH (ref 0.3–1.2)
Total Protein: 5.8 g/dL — ABNORMAL LOW (ref 6.5–8.1)

## 2020-11-12 LAB — GLUCOSE, CAPILLARY
Glucose-Capillary: 175 mg/dL — ABNORMAL HIGH (ref 70–99)
Glucose-Capillary: 145 mg/dL — ABNORMAL HIGH (ref 70–99)
Glucose-Capillary: 171 mg/dL — ABNORMAL HIGH (ref 70–99)
Glucose-Capillary: 215 mg/dL — ABNORMAL HIGH (ref 70–99)

## 2020-11-12 LAB — CBC
HCT: 28.7 % — ABNORMAL LOW (ref 39.0–52.0)
Hemoglobin: 10.1 g/dL — ABNORMAL LOW (ref 13.0–17.0)
MCH: 30 pg (ref 26.0–34.0)
MCHC: 35.2 g/dL (ref 30.0–36.0)
MCV: 85.2 fL (ref 80.0–100.0)
Platelets: 258 10*3/uL (ref 150–400)
RBC: 3.37 MIL/uL — ABNORMAL LOW (ref 4.22–5.81)
RDW: 19.7 % — ABNORMAL HIGH (ref 11.5–15.5)
WBC: 9 10*3/uL (ref 4.0–10.5)
nRBC: 0 % (ref 0.0–0.2)

## 2020-11-12 LAB — CYTOLOGY - NON PAP

## 2020-11-12 LAB — HEMOGLOBIN AND HEMATOCRIT, BLOOD
HCT: 27.4 % — ABNORMAL LOW (ref 39.0–52.0)
HCT: 28.3 % — ABNORMAL LOW (ref 39.0–52.0)
HCT: 30.1 % — ABNORMAL LOW (ref 39.0–52.0)
Hemoglobin: 10.1 g/dL — ABNORMAL LOW (ref 13.0–17.0)
Hemoglobin: 10.6 g/dL — ABNORMAL LOW (ref 13.0–17.0)
Hemoglobin: 9.7 g/dL — ABNORMAL LOW (ref 13.0–17.0)

## 2020-11-12 LAB — CANCER ANTIGEN 19-9: CA 19-9: 1641 U/mL — ABNORMAL HIGH (ref 0–35)

## 2020-11-12 IMAGING — XA IR CHOLANGIOGRAM PERCUTANEOUS TRANSHEPATIC
4 series · 13 of 13 positions shown · non-contrast
Comparison: none

INDICATION: 70-year-old with obstructive jaundice and a pancreatic head mass.
Patient needs biliary decompression.

[Series 1: ir cholangiogram percutaneous transhepatic · 6 of 6 slices shown]
[im 1/6]
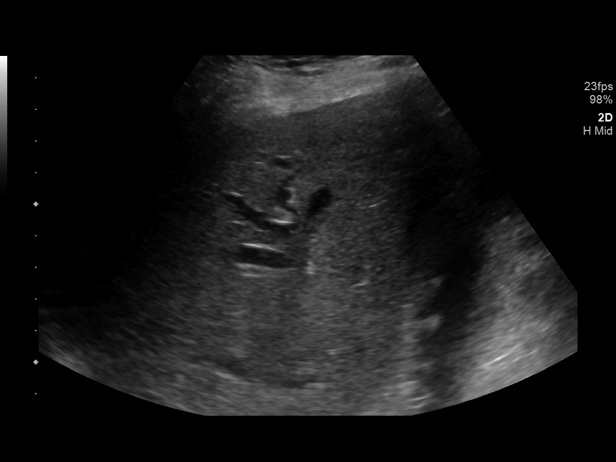
[im 2/6]
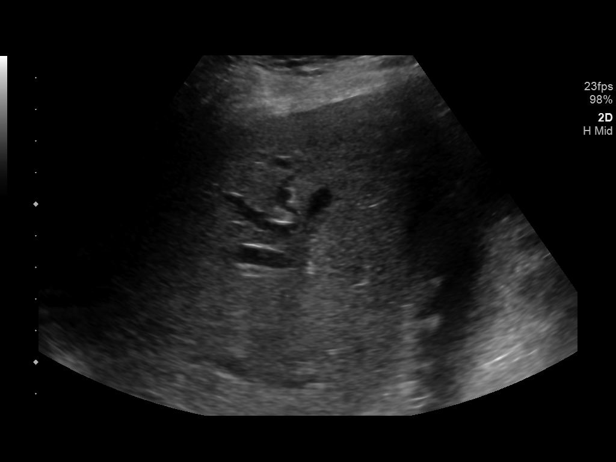
[im 3/6]
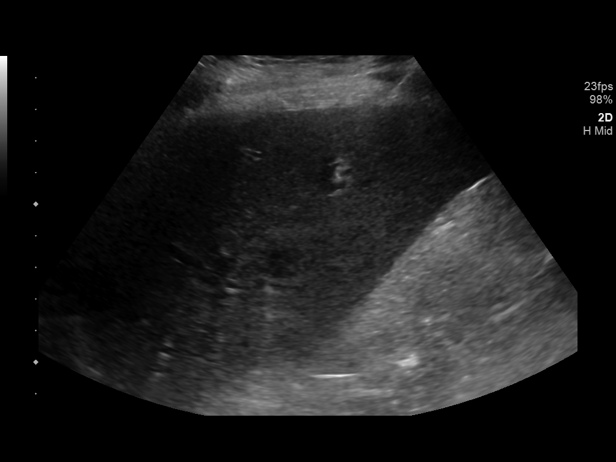
[im 4/6]
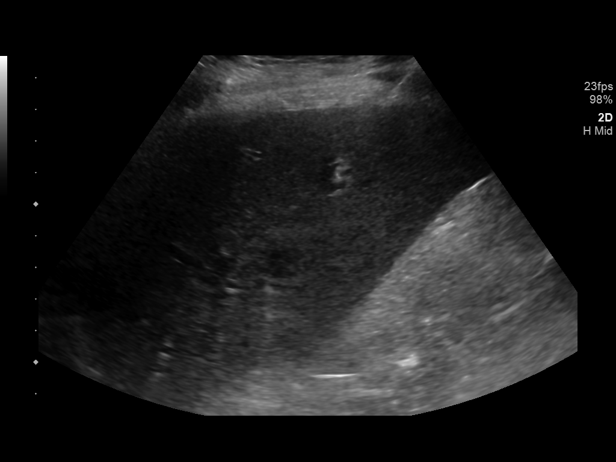
[im 5/6]
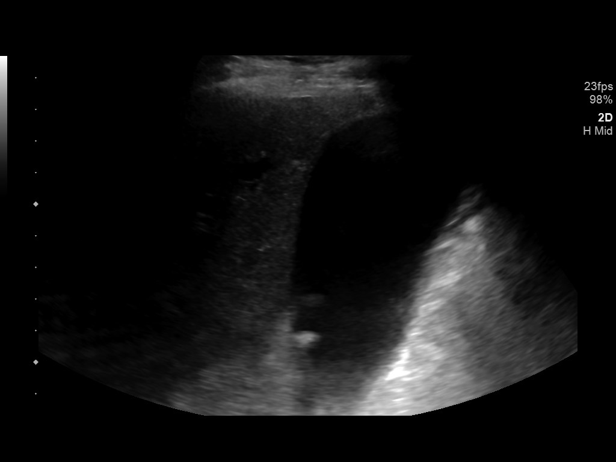
[im 6/6]
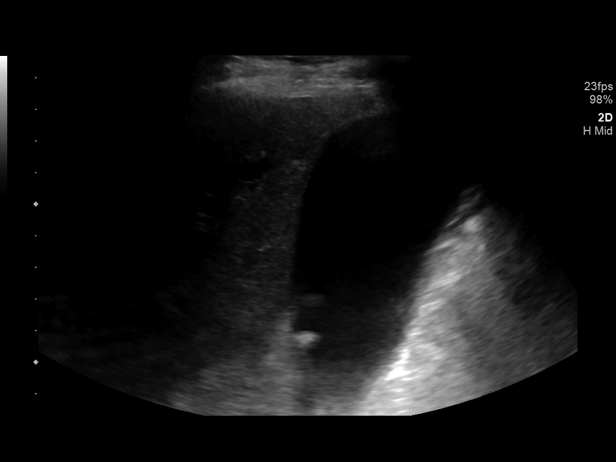

[Series 2: care single · 2 of 2 slices shown (1 of 2)]
[im 1/2]
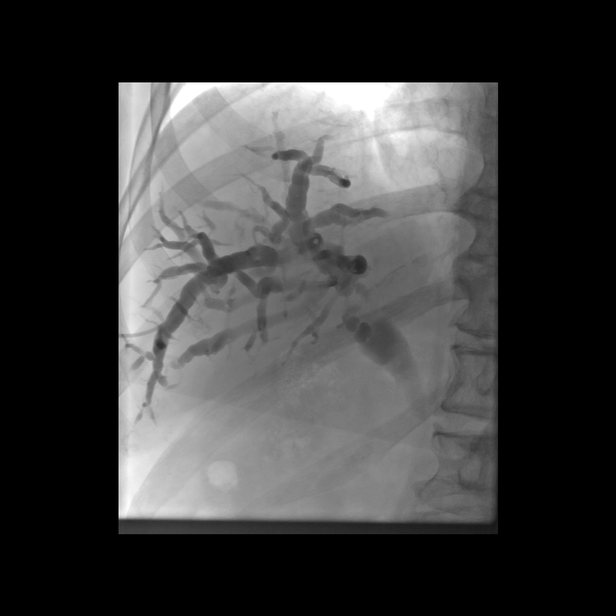
[im 2/2]
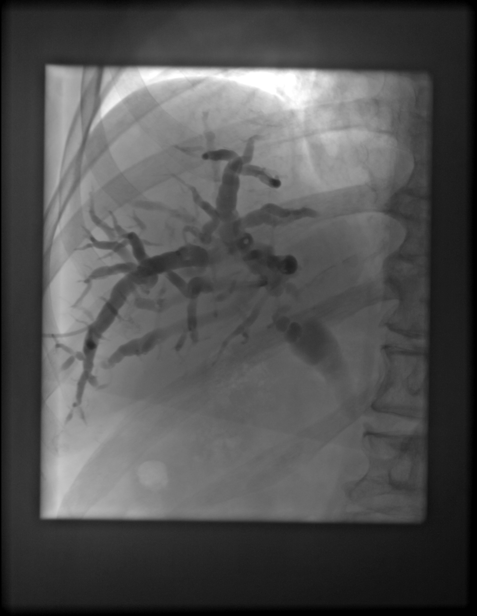

[Series 4: care single · 2 of 2 slices shown (2 of 2)]
[im 1/2]
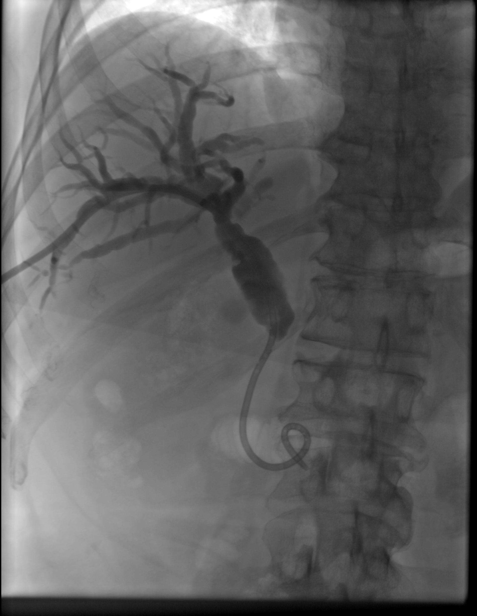
[im 2/2]
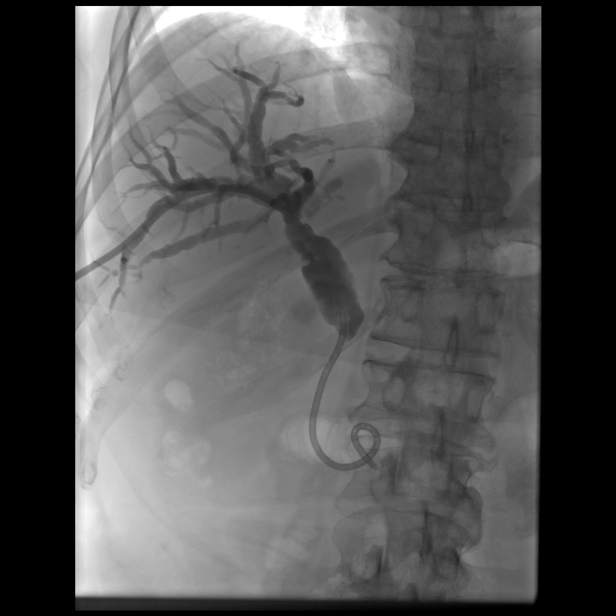

[Series 300: ir percutaneous transhepatic cholangiogr · 3 of 3 slices shown]
[im 1/3]
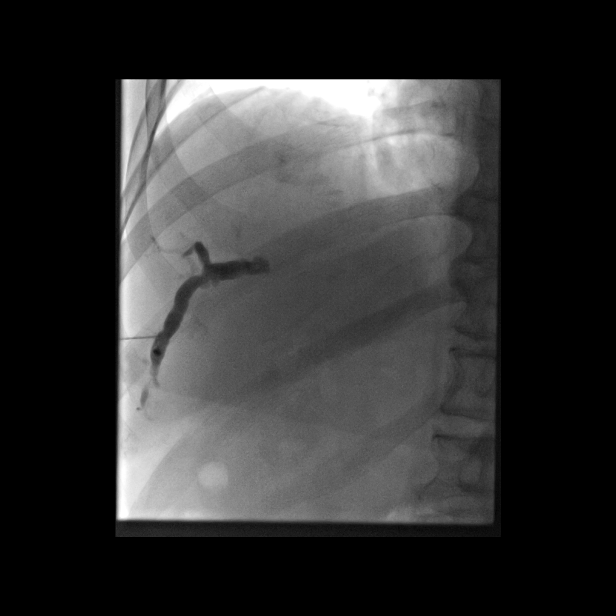
[im 2/3]
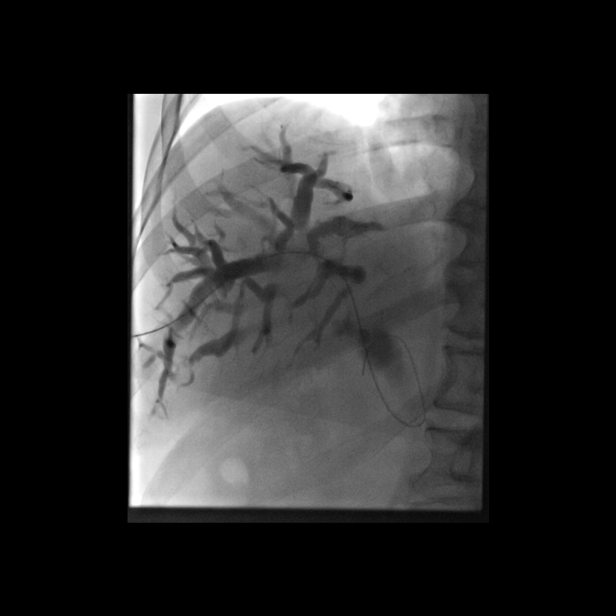
[im 3/3]
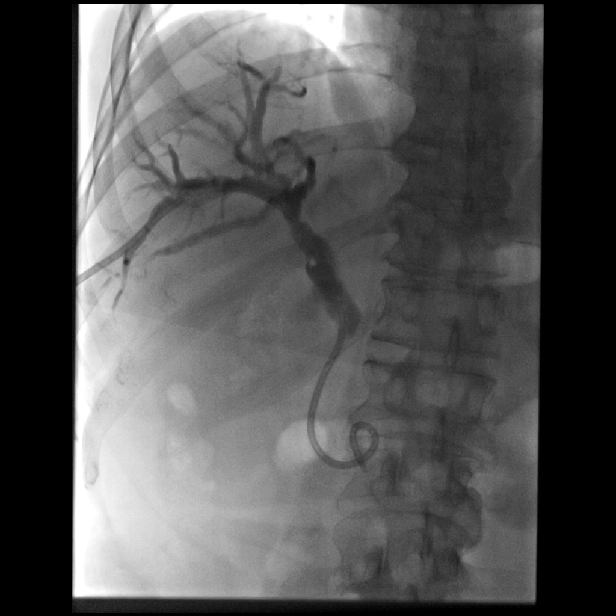

[13 of 13 positions shown; findings below may reference images not displayed]

EXAM:
1. Percutaneous transhepatic cholangiogram
2. Placement of internal/external biliary drain using ultrasound and
fluoroscopic guidance

MEDICATIONS:
Cefoxitin 2 g; The antibiotic was administered within an appropriate
time frame prior to the initiation of the procedure.

ANESTHESIA/SEDATION:
Moderate (conscious) sedation was employed during this procedure. A
total of Versed 4.0 mg and Fentanyl 100 mcg was administered
intravenously.

Moderate Sedation Time: 20 minutes. The patient's level of
consciousness and vital signs were monitored continuously by
radiology nursing throughout the procedure under my direct
supervision.

FLUOROSCOPY TIME:  Fluoroscopy Time: 3 minutes, 40 mGy

COMPLICATIONS:
None immediate.

PROCEDURE:
Informed written consent was obtained from the patient after a
thorough discussion of the procedural risks, benefits and
alternatives. All questions were addressed.A timeout was performed
prior to the initiation of the procedure.

The anterior and right side of the abdomen was prepped and draped in
sterile fashion. Maximal barrier sterile technique was utilized
including caps, mask, sterile gowns, sterile gloves, sterile drape,
hand hygiene and skin antiseptic. Ultrasound demonstrated severe
intrahepatic biliary dilatation. A dilated peripheral bile duct in
the right hepatic lobe was targeted. The skin was anesthetized using
1% lidocaine. Using ultrasound guidance, 21 gauge needle was
directed into a dilated bile duct. Bile was draining from the
needle. Contrast injection confirmed placement in the biliary
system. A 0.018 wire was easily advanced into the biliary system and
a transitional dilator set was placed. Bentson wire was placed.
Kumpe catheter was advanced over the wire. A Kumpe catheter was
advanced into the common bile duct and manipulated into the duodenum
using the Bentson wire. The Kumpe catheter was removed and the tract
was dilated over the wire to accommodate a 10 French biliary drain.
Biliary drain was advanced over the wire and into the duodenum.
Pigtail was coiled within the duodenum. Large volume of bile was
aspirated. Samples sent for culture and cytology. Contrast injection
was performed. Catheter was attached to a gravity bag. Catheter was
sutured to skin and a sterile dressing was placed.
FINDINGS: Severe intrahepatic biliary dilatation. A peripheral right hepatic
bile duct was successfully cannulated. Cholangiogram demonstrated an
obstruction in the distal common bile duct. Catheter and wire were
advanced beyond the obstruction and the drain tip was positioned in
the duodenum.
IMPRESSION: Percutaneous transhepatic cholangiogram demonstrating a distal
common bile duct obstruction.

Successful placement of an internal/external biliary drain. Drain
tip is in the duodenum.

## 2020-11-12 MED ORDER — LIDOCAINE HCL (PF) 1 % IJ SOLN
INTRAMUSCULAR | Status: AC | PRN
  Administered 2020-11-12: 10 mL via INTRADERMAL

## 2020-11-12 MED ORDER — MIDAZOLAM HCL 2 MG/2ML IJ SOLN
INTRAMUSCULAR | Status: AC | PRN
  Administered 2020-11-12 (×4): 1 mg via INTRAVENOUS

## 2020-11-12 MED ORDER — SODIUM CHLORIDE 0.9 % IV SOLN
2.0000 g | INTRAVENOUS | Status: AC
  Administered 2020-11-12: 2 g via INTRAVENOUS
  Filled 2020-11-12 (×2): qty 2

## 2020-11-12 MED ORDER — IOHEXOL 300 MG/ML  SOLN
50.0000 mL | Freq: Once | INTRAMUSCULAR | Status: AC | PRN
  Administered 2020-11-12: 20 mL

## 2020-11-12 MED ORDER — MIDAZOLAM HCL 2 MG/2ML IJ SOLN
INTRAMUSCULAR | Status: AC
  Filled 2020-11-12: qty 4

## 2020-11-12 MED ORDER — FENTANYL CITRATE (PF) 100 MCG/2ML IJ SOLN
INTRAMUSCULAR | Status: AC | PRN
  Administered 2020-11-12 (×2): 50 ug via INTRAVENOUS

## 2020-11-12 MED ORDER — LIDOCAINE HCL 1 % IJ SOLN
INTRAMUSCULAR | Status: AC
  Filled 2020-11-12: qty 20

## 2020-11-12 MED ORDER — FENTANYL CITRATE (PF) 100 MCG/2ML IJ SOLN
INTRAMUSCULAR | Status: AC
  Filled 2020-11-12: qty 2

## 2020-11-12 NOTE — Progress Notes (Signed)
PROGRESS NOTE    Francisco Frank  [id number] DOB: 1950/11/13 DOA: 11/09/2020 PCP: Kennieth Rad, MD   Brief Narrative: 70 year old male with history of hypertension who has had GI evaluation found to have pancreatic mass suspected neoplasm and admitted for ERCP and EUS biopsy. He was recently seen by GI for 20 pound weight loss since April and labs showed normal liver enzyme and further work-up showed minimal stranding at pancreatic head cholelithiasis and continue to have weight loss and will continue to follow-up with GI and back in August beginning he had abnormal LFTs MRI of the abdomen showed pancreatic body mass. He was directly admitted for further work-up by GI.  Subjective:  Seen this morning.  He feels much improved, wife and daughter at the bedside. Afebrile overnight Hemoglobin 10.6 g.  Assessment & Plan:  Abnormal LFTs-obstructive jaundice Pancreatic head mass suspecting neoplasm:  ERCP yesterday unable to cannulate the bile duct, EUS shows localized severe mucosal changes in the duodenal, irregular mass in the pancreatic head, sonographic evidence suggesting invasion into the portal vein and SMV, fine-needle biopsy done.  Pending biopsy result.  CA 19-9 1641, going for IR for percutaneous biliary drainage today   Hypokalemia resolved    Anemia likely from chronic disease.  Also some drop overnight could be in the setting of sphincterotomy  as patient was on Plavix monitor stable overall. 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  Essential HTN:BP stable on Norvasc.Atenolol remains on hold.    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  Recent Labs  Lab 11/11/20 1221 11/11/20 1701  11/11/20 2001 11/11/20 2135 11/12/20 0753  GLUCAP 172* 243* 223* 243* 175*     CAD in native artery: Plavix remains on hold due to procedure. Resume once okay with GI.  Diet Order             Diet NPO time specified Except for: Sips with Meds  Diet effective midnight                  Patient's Body mass index is 23.83 kg/m. DVT prophylaxis: SCDs Start: 11/09/20 1636 Code Status:   Code Status: Full Code  Family Communication: plan of care discussed with patient at bedside. Status [id number] inpatient. Patient remains hospitalized for ongoing diagnostic work-up and procedures as outlined above.  Dispo: The patient is from: Home              Anticipated d/c is to: Home              Patient currently is not medically stable to d/c.   Difficult to place patient No  Unresulted Labs (From admission, onward)     Start     Ordered   11/12/20 1145  Aerobic/Anaerobic Culture w Gram Stain (surgical/deep wound)  Once,   R        11/12/20 1145   11/12/20 0500  CBC  Tomorrow morning,   R        11/11/20 1015   11/11/20 1600  Hemoglobin and hematocrit, blood  Now then every 8 hours,   R (with TIMED occurrences)     Comments: Call for hgb less than 8    11/11/20 1602  Medications reviewed:  Scheduled Meds:  amLODipine  5 mg Oral Daily   indomethacin  100 mg Rectal Once   insulin aspart  0-5 Units Subcutaneous QHS   insulin aspart  0-9 Units Subcutaneous TID WC   insulin glargine-yfgn  6 Units Subcutaneous QHS   lidocaine       pantoprazole (PROTONIX) IV  40 mg Intravenous Q12H   sertraline  100 mg Oral Daily   sodium chloride flush  3 mL Intravenous Q12H   Continuous Infusions:  sodium chloride     lactated ringers 75 mL/hr at 11/12/20 0524   Consultants:see note  Procedures:see note Antimicrobials: Anti-infectives (From admission, onward)    Start     Dose/Rate Route Frequency Ordered Stop   11/12/20 1030  cefOXitin (MEFOXIN) 2 g in sodium chloride 0.9 %  100 mL IVPB        2 g 200 mL/hr over 30 Minutes Intravenous On call 11/12/20 1010 11/12/20 1135      Culture/Microbiology No results found for: SDES, SPECREQUEST, CULT, REPTSTATUS  Other culture-see note  Objective: Vitals: Today's Vitals   11/12/20 1135 11/12/20 1140 11/12/20 1145 11/12/20 1148  BP: 120/76 137/84 (!) 139/93 (!) 156/87  Pulse: 65 67 72 78  Resp: '13 20 16   '$ Temp:      TempSrc:      SpO2: 99% 98% 99% 96%  Weight:      Height:      PainSc:        Intake/Output Summary (Last 24 hours) at 11/12/2020 1208 Last data filed at 11/12/2020 0800 Gross per 24 hour  Intake 1553 ml  Output --  Net 1553 ml    Filed Weights   11/11/20 0500 11/11/20 1149 11/12/20 0500  Weight: 70.1 kg 74.8 kg 71.1 kg   Weight change: 4.743 kg  Intake/Output from previous day: 08/23 0701 - 08/24 0700 In: 1553 [I.V.:1553] Out: -  Intake/Output this shift: No intake/output data recorded. Filed Weights   11/11/20 0500 11/11/20 1149 11/12/20 0500  Weight: 70.1 kg 74.8 kg 71.1 kg   Examination: General exam: AAOx 3 older than stated age, weak appearing. HEENT:Oral mucosa moist, ICTERUS++, Ear/Nose WNL grossly, dentition normal. Respiratory system: bilaterally diminished, , no use of accessory muscle Cardiovascular system: S1 & S2 +, No JVD,. Gastrointestinal system: Abdomen soft, NT,ND, BS+ Nervous System:Alert, awake, moving extremities and grossly nonfocal Extremities: NO edema, distal peripheral pulses palpable.  Skin: No rashes,no icterus. MSK: Normal muscle bulk,tone, power  Data Reviewed: I have personally reviewed following labs and imaging studies CBC: Recent Labs  Lab 11/09/20 1600 11/10/20 0445 11/11/20 1701 11/11/20 2345  WBC 8.1 8.2  --   --   NEUTROABS 4.7 4.5  --   --   HGB 12.8* 11.1* 11.4* 10.6*  HCT 36.4* 31.9* 33.1* 30.1*  MCV 86.1 86.4  --   --   PLT 292 262  --   --     Basic Metabolic Panel: Recent Labs  Lab 11/06/20 1156 11/09/20 1600  11/10/20 0445 11/11/20 0753 11/12/20 0743  NA 136 134* 137 133* 135  K 4.0 3.9 3.4* 3.7 3.8  CL 101 101 103 101 101  CO2 '24 23 25 23 24  '$ GLUCOSE 252* 285* 148* 177* 199*  BUN '23 20 20 16 21  '$ CREATININE 1.41 1.04 0.99 0.80 0.94  CALCIUM 9.8 9.8 9.2 9.2 8.9  MG  --  2.0  --   --   --     GFR: Estimated Creatinine  Clearance: 69.7 mL/min (by C-G formula based on SCr of 0.94 mg/dL). Liver Function Tests: Recent Labs  Lab 11/06/20 1156 11/09/20 1600 11/10/20 0445 11/11/20 0753 11/12/20 0743  AST 79* 74* 61* 58* 62*  ALT 83* 75* 63* 58* 52*  ALKPHOS 447* 389* 318* 311* 311*  BILITOT 14.2* 16.4* 14.5* 15.6* 16.5*  PROT 7.0 7.5 6.3* 5.3* 5.8*  ALBUMIN 3.9 3.7 3.0* 2.6* 2.7*    Recent Labs  Lab 11/11/20 0753  LIPASE 18    No results for input(s): AMMONIA in the last 168 hours. Coagulation Profile: Recent Labs  Lab 11/10/20 1019  INR 0.9    Cardiac Enzymes: No results for input(s): CKTOTAL, CKMB, CKMBINDEX, TROPONINI in the last 168 hours. BNP (last 3 results) No results for input(s): PROBNP in the last 8760 hours. HbA1C: Recent Labs    11/09/20 1600  HGBA1C 9.6*    CBG: Recent Labs  Lab 11/11/20 1221 11/11/20 1701 11/11/20 2001 11/11/20 2135 11/12/20 0753  GLUCAP 172* 243* 223* 243* 175*    Lipid Profile: No results for input(s): CHOL, HDL, LDLCALC, TRIG, CHOLHDL, LDLDIRECT in the last 72 hours. Thyroid Function Tests: No results for input(s): TSH, T4TOTAL, FREET4, T3FREE, THYROIDAB in the last 72 hours. Anemia Panel: No results for input(s): VITAMINB12, FOLATE, FERRITIN, TIBC, IRON, RETICCTPCT in the last 72 hours. Sepsis Labs: No results for input(s): PROCALCITON, LATICACIDVEN in the last 168 hours.  Recent Results (from the past 240 hour(s))  SARS CORONAVIRUS 2 (TAT 6-24 HRS) Nasopharyngeal Nasopharyngeal Swab     Status: None   Collection Time: 11/09/20  5:15 PM   Specimen: Nasopharyngeal Swab  Result Value Ref Range Status   SARS  Coronavirus 2 NEGATIVE NEGATIVE Final    Comment: (NOTE) SARS-CoV-2 target nucleic acids are NOT DETECTED.  The SARS-CoV-2 RNA is generally detectable in upper and lower respiratory specimens during the acute phase of infection. Negative results do not preclude SARS-CoV-2 infection, do not rule out co-infections with other pathogens, and should not be used as the sole basis for treatment or other patient management decisions. Negative results must be combined with clinical observations, patient history, and epidemiological information. The expected result is Negative.  Fact Sheet for Patients: SugarRoll.be  Fact Sheet for Healthcare Providers: https://www.woods-mathews.com/  This test is not yet approved or cleared by the [place] FDA and  has been authorized for detection and/or diagnosis of SARS-CoV-2 by FDA under an Emergency Use Authorization (EUA). This EUA will remain  in effect (meaning this test can be used) for the duration of the COVID-19 declaration under Se ction 564(b)(1) of the Act, 21 U.S.C. section 360bbb-3(b)(1), unless the authorization is terminated or revoked sooner.  Performed at [facility], [place] [address] [place], [place] [address]       Radiology Studies: DG ERCP BILIARY & PANCREATIC DUCTS  Result Date: 11/11/2020 CLINICAL DATA:  ERCP. EXAM: ERCP TECHNIQUE: Multiple spot images obtained with the fluoroscopic device and submitted for interpretation post-procedure. FLUOROSCOPY TIME:  Fluoroscopy Time:  6 minutes and 2 seconds. Radiation Exposure Index (if provided by the fluoroscopic device): None provided Number of Acquired Spot Images: 6 COMPARISON:  MR abdomen, 11/06/2020. FINDINGS: Multiple, limited oblique planar images of the RIGHT upper quadrant demonstrating endoscopy and common bile duct cannulation. IMPRESSION: Fluoroscopic imaging for ERCP. Please see performing service dictation for complete  description of intraprocedural findings. Electronically Signed   By: Michaelle Birks M.D.   On: 11/11/2020 16:19     LOS: 1 day  Antonieta Pert, MD [facility]  11/12/2020, 12:08 PM

## 2020-11-12 NOTE — Progress Notes (Signed)
Initial Nutrition Assessment  INTERVENTION:   Once diet advanced: -Ensure Plus PO BID, each provides 350 kcals and 13g protein  -Will provide education at a later time per pt's family request   NUTRITION DIAGNOSIS:   Increased nutrient needs related to chronic illness as evidenced by estimated needs.   GOAL:   Patient will meet greater than or equal to 90% of their needs  MONITOR:   PO intake, Supplement acceptance, Diet advancement, Labs, Weight trends, I & O's  REASON FOR ASSESSMENT:   Malnutrition Screening Tool    ASSESSMENT:   70 y.o. male with past medical history of back pain, diabetes, hearing deficit, hyperlipidemia, hypertension, insomnia, coronary artery disease on chronic Plavix (last dose on 8/18) who was admitted to [facility] on 8/21 with history of weight loss, abnormal LFTs, obstructive jaundice and pancreatic mass.  EUS revealed mass in pancreatic head and biopsy was performed on 8/23.  Patient underwent ERCP on 8/23 as well but unable to obtain biliary access.  8/23: ERCP, EUS  Patient not in room at time of visit, having biliary drain placement. Wife and daughter at bedside who state that they would like information on diet prior to discharge. Will provide this once procedures complete and plan in place for treatment.  Pt has been consuming 50-75% of meals when on diet. Currently NPO.  Reports 20 lbs of weight loss. Per weight records, pt has lost 36 lbs since 4/21 (18% wt loss x 4 months, significant for time frame). Suspect some degree of malnutrition.  Admission weight: 159 lbs Current weight: 156 lbs.  Medications: Lactated ringers  Labs reviewed:  CBGs: 145-243  NUTRITION - FOCUSED PHYSICAL EXAM:  Unable to complete  Diet Order:   Diet Order             Diet NPO time specified Except for: Sips with Meds  Diet effective midnight                   EDUCATION NEEDS:   Not appropriate for education at this time  Skin:   Skin Assessment: Reviewed RN Assessment  Last BM:  8/24  Height:   Ht Readings from Last 1 Encounters:  11/11/20 '5\' 8"'$  (1.727 m)    Weight:   Wt Readings from Last 1 Encounters:  11/12/20 71.1 kg    BMI:  Body mass index is 23.83 kg/m.  Estimated Nutritional Needs:   Kcal:  2000-2200  Protein:  100-115g  Fluid:  2L/day  Clayton Bibles, MS, RD, LDN Inpatient Clinical Dietitian Contact information available via Amion

## 2020-11-12 NOTE — Progress Notes (Signed)
Patient ID: Francisco Frank, male   DOB: [birth year], 70 y.o.   MRN: [id number]    Progress Note   Subjective   Day # 3  CC; obstructive jaundice, pancreatic head mass  ERCP yesterday-unable to cannulate the bile duct EUS-localized severe mucosal changes in the duodenum and altered texture, 26 mm x 30 mm irregular mass in the pancreatic head, sonographic evidence suggesting invasion into the portal vein and SMV, atrophic pancreatic parenchyma and upstream pancreatic ductal dilation.  Fine-needle biopsy done..  No malignant appearing lymph nodes  Cytology pending CA 19-9 = 1641  Hemoglobin 11.4> 10.6 last p.m. pending this a.m. T bili 16.5/alk phos 311/AST 62/ALT 52  Patient says he has been sleeping better, and is less pruritic, no complaints of pain after procedures yesterday, no melena  IR is seeing this morning and plan for percutaneous biliary drainage later today   Objective   Vital signs in last 24 hours: Temp:  [97.7 F (36.5 C)-98.6 F (37 C)] 98.6 F (37 C) (08/24 0527) Pulse Rate:  [63-84] 69 (08/24 0527) Resp:  [12-19] 18 (08/24 0527) BP: (119-165)/(72-93) 119/72 (08/24 0527) SpO2:  [95 %-100 %] 95 % (08/24 0527) Weight:  [71.1 kg-74.8 kg] 71.1 kg (08/24 0500) Last BM Date: 11/11/20 General:   Jaundiced older white male in NAD Heart:  Regular rate and rhythm; no murmurs Lungs: Respirations even and unlabored, lungs CTA bilaterally Abdomen:  Soft, no focal tenderness and nondistended. Normal bowel sounds. Extremities:  Without edema. Neurologic:  Alert and oriented,  grossly normal neurologically. Psych:  Cooperative. Normal mood and affect.  Intake/Output from previous day: 08/23 0701 - 08/24 0700 In: 1553 [I.V.:1553] Out: -  Intake/Output this shift: No intake/output data recorded.  Lab Results: Recent Labs    11/09/20 1600 11/10/20 0445 11/11/20 1701 11/11/20 2345  WBC 8.1 8.2  --   --   HGB 12.8* 11.1* 11.4* 10.6*  HCT 36.4* 31.9* 33.1* 30.1*   PLT 292 262  --   --    BMET Recent Labs    11/10/20 0445 11/11/20 0753 11/12/20 0743  NA 137 133* 135  K 3.4* 3.7 3.8  CL 103 101 101  CO2 25 23 24   GLUCOSE 148* 177* 199*  BUN 20 16 21   CREATININE 0.99 0.80 0.94  CALCIUM 9.2 9.2 8.9   LFT Recent Labs    11/12/20 0743  PROT 5.8*  ALBUMIN 2.7*  AST 62*  ALT 52*  ALKPHOS 311*  BILITOT 16.5*   PT/INR Recent Labs    11/10/20 1019  LABPROT 12.1  INR 0.9    Studies/Results: DG ERCP BILIARY & PANCREATIC DUCTS  Result Date: 11/11/2020 CLINICAL DATA:  ERCP. EXAM: ERCP TECHNIQUE: Multiple spot images obtained with the fluoroscopic device and submitted for interpretation post-procedure. FLUOROSCOPY TIME:  Fluoroscopy Time:  6 minutes and 2 seconds. Radiation Exposure Index (if provided by the fluoroscopic device): None provided Number of Acquired Spot Images: 6 COMPARISON:  MR abdomen, 11/06/2020. FINDINGS: Multiple, limited oblique planar images of the RIGHT upper quadrant demonstrating endoscopy and common bile duct cannulation. IMPRESSION: Fluoroscopic imaging for ERCP. Please see performing service dictation for complete description of intraprocedural findings. Electronically Signed   By: Michaelle Birks M.D.   On: 11/11/2020 16:19       Assessment / Plan:    #42 70 year old white male admitted with obstructive jaundice, in setting of recent weight loss, and new onset diabetes with abnormal imaging of the pancreas on MRI.  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 Path is pending  #2 diabetes mellitus-on insulin #3 history of coronary artery disease-on Plavix as an outpatient-on hold  Plan; percutaneous biliary drainage per IR today Await path Will need CT of the chest probably tomorrow to complete staging work-up    Principal Problem:    Pancreatic mass Active Problems:   Hyperbilirubinemia   Cholestatic pruritus   HTN (hypertension)   Type 2 diabetes mellitus without complication, with long-term current use of insulin (HCC)   CAD in native artery     LOS: 1 day   Dalayna Lauter PA-C 11/12/2020, 8:49 AM

## 2020-11-12 NOTE — Consult Note (Signed)
Chief Complaint: Patient was seen in consultation today for percutaneous transhepatic cholangiogram with biliary drain placement  Referring Physician(s): Mansouraty,G  Supervising Physician: Markus Daft  Patient Status: [facility] - In-pt  History of Present Illness: Francisco Frank is a 70 y.o. male with past medical history of back pain, diabetes, hearing deficit, hyperlipidemia, hypertension, insomnia, coronary artery disease on chronic Plavix (last dose on 8/18) who was admitted to [facility] on 8/21 with history of weight loss, abnormal LFTs, obstructive jaundice and pancreatic mass.  EUS revealed mass in pancreatic head and biopsy was performed on 8/23.  Patient underwent ERCP on 8/23 as well but unable to obtain biliary access.  Needle-knife sphincterotomy/fistulotomy was performed with some minor bleeding requiring epinephrine injection.  Request now received for PTC with biliary drain placement.  He is currently afebrile, creatinine normal, total bilirubin 16.5, PT/INR normal, CA 19-9 is 1,641, latest hemoglobin yesterday 10.6, platelets normal.  Past Medical History:  Diagnosis Date   Back pain    Diabetes mellitus without complication (HCC)    Hearing deficit    Hyperlipemia    Hypertension    Insomnia    Sleep apnea     Past Surgical History:  Procedure Laterality Date   CARDIAC CATHETERIZATION     REPLACEMENT TOTAL KNEE     ROTATOR CUFF REPAIR      Allergies: Norco [hydrocodone-acetaminophen]  Medications: Prior to Admission medications   Medication Sig Start Date End Date Taking? Authorizing Provider  amLODipine (NORVASC) 5 MG tablet Take 5 mg by mouth at bedtime. 10/22/20  Yes [provider]  atenolol (TENORMIN) 50 MG tablet Take 50 mg by mouth at bedtime.   Yes [provider]  clopidogrel (PLAVIX) 75 MG tablet Take 75 mg by mouth at bedtime.   Yes [provider]  dicyclomine (BENTYL) 10 MG capsule Take 1 capsule (10 mg  total) by mouth as needed for spasms. 10 mg po q 6 hours prn 08/06/20  Yes Cirigliano, Vito V, DO  Insulin Glargine (BASAGLAR KWIKPEN) 100 UNIT/ML Inject 6 Units into the skin at bedtime. 09/27/20  Yes [provider]  NOVOLOG FLEXPEN 100 UNIT/ML FlexPen Inject 10 Units into the skin 3 (three) times daily as needed for high blood sugar. If BS is 150-200=2 units, 201-250=4 units, 251-300=6 units, 301-350=8 units, 351-400=10 units. 09/18/20  Yes [provider]  traMADol (ULTRAM) 50 MG tablet Take 50 mg by mouth every 6 (six) hours as needed for moderate pain or severe pain. 06/11/20  Yes [provider]     Family History  Problem Relation Age of Onset   Cirrhosis Father    Alcohol abuse Father    Colon cancer Maternal Grandmother    Colonic polyp Maternal Grandmother    Heart disease Paternal Grandfather    Esophageal cancer Neg Hx    Pancreatic cancer Neg Hx    Stomach cancer Neg Hx     Social History   Socioeconomic History   Marital status: Married    Spouse name: Not on file   Number of children: Not on file   Years of education: Not on file   Highest education level: Not on file  Occupational History   Not on file  Tobacco Use   Smoking status: Never   Smokeless tobacco: Current    Types: Snuff    Last attempt to quit: 2018  Vaping Use   Vaping Use: Never used  Substance and Sexual Activity   Alcohol use: Yes  Comment: rare   Drug use: Never   Sexual activity: Not Currently  Other Topics Concern   Not on file  Social History Narrative   Not on file   Social Determinants of Health   Financial Resource Strain: Not on file  Food Insecurity: Not on file  Transportation Needs: Not on file  Physical Activity: Not on file  Stress: Not on file  Social Connections: Not on file      Review of Systems currently denies fever, headache, chest pain, dyspnea, cough, abdominal/back pain, nausea, vomiting or bleeding.  He remains  jaundiced.  Vital Signs: BP 119/72 (BP Location: Left Arm)   Pulse 69   Temp 98.6 F (37 C) (Oral)   Resp 18   Ht 5' 8"  (1.727 m)   Wt 156 lb 12 oz (71.1 kg)   SpO2 95%   BMI 23.83 kg/m   Physical Exam awake, alert.  Scleral icterus noted.  Chest clear to auscultation bilaterally.  Heart with regular rate and rhythm.  Abdomen soft, positive bowel sounds, nontender.  No lower extremity edema.  Imaging: MR Abdomen W Wo Contrast  Result Date: 11/06/2020 CLINICAL DATA:  Weight loss. Outside CT demonstrating a pancreatic abnormality. Epigastric discomfort. EXAM: MRI ABDOMEN WITHOUT AND WITH CONTRAST (INCLUDING MRCP) TECHNIQUE: Multiplanar multisequence MR imaging of the abdomen was performed both before and after the administration of intravenous contrast. Heavily T2-weighted images of the biliary and pancreatic ducts were obtained, and three-dimensional MRCP images were rendered by post processing. CONTRAST:  62m GADAVIST GADOBUTROL 1 MMOL/ML IV SOLN COMPARISON:  None. FINDINGS: Lower chest: Normal heart size without pericardial or pleural effusion. Hepatobiliary: No suspicious liver lesion. The gallbladder is mildly distended 10.3 cm with small gallstones within. No evidence of acute cholecystitis. Moderate intra and extrahepatic biliary duct dilatation. Common duct 1.7 cm on 16/8. Followed to the level of the pancreatic head. Pancreas: Pancreatic body and tail atrophy with duct dilatation including up to 9 mm on 20/5. Both ducts undergo an abrupt transition in the region of the pancreatic head. A non border deforming pancreatic head mass is subtly apparent at 3.2 x 2.6 cm on 52/23. No superimposed acute pancreatitis. Spleen:  Normal in size, without focal abnormality. Adrenals/Urinary Tract: Normal adrenal glands. Tiny upper pole left renal lesion is likely a cyst. Normal right kidney. No hydronephrosis. Stomach/Bowel: Normal stomach, without wall thickening. Normal abdominal bowel loops.  Vascular/Lymphatic: Aortic atherosclerosis. No arterial involvement by tumor. The SMV is adjacent to presumed tumor without encasement, including on 51/23. Portal vein uninvolved. No abdominal adenopathy. A left periaortic 7 mm node on 29/5 is not pathologic by size criteria. Other: No ascites. Subtle left-sided pericolonic nodule of 5 mm on 36/23. Musculoskeletal: No acute osseous abnormality. IMPRESSION: 1. 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 2. No evidence of hepatic metastasis or vascular encasement. The SMV likely contacts tumor over a significantly less than 180 degree span. 3. Minimal left-sided pericolonic nodularity is felt unlikely to represent peritoneal isolated metastasis. Possibly the sequelae of colonic diverticulosis or even a reactive node. Recommend attention on follow-up. 4. Cholelithiasis. Electronically Signed   By: KAbigail MiyamotoM.D.   On: 11/06/2020 13:49   MR 3D Recon At Scanner  Result Date: 11/06/2020 CLINICAL DATA:  Weight loss. Outside CT demonstrating a pancreatic abnormality. Epigastric discomfort. EXAM: MRI ABDOMEN WITHOUT AND WITH CONTRAST (INCLUDING MRCP) TECHNIQUE: Multiplanar multisequence MR imaging  of the abdomen was performed both before and after the administration of intravenous contrast. Heavily T2-weighted images of the biliary and pancreatic ducts were obtained, and three-dimensional MRCP images were rendered by post processing. CONTRAST:  75m GADAVIST GADOBUTROL 1 MMOL/ML IV SOLN COMPARISON:  None. FINDINGS: Lower chest: Normal heart size without pericardial or pleural effusion. Hepatobiliary: No suspicious liver lesion. The gallbladder is mildly distended 10.3 cm with small gallstones within. No evidence of acute cholecystitis. Moderate intra and extrahepatic biliary duct dilatation. Common duct 1.7 cm on 16/8. Followed to the level of  the pancreatic head. Pancreas: Pancreatic body and tail atrophy with duct dilatation including up to 9 mm on 20/5. Both ducts undergo an abrupt transition in the region of the pancreatic head. A non border deforming pancreatic head mass is subtly apparent at 3.2 x 2.6 cm on 52/23. No superimposed acute pancreatitis. Spleen:  Normal in size, without focal abnormality. Adrenals/Urinary Tract: Normal adrenal glands. Tiny upper pole left renal lesion is likely a cyst. Normal right kidney. No hydronephrosis. Stomach/Bowel: Normal stomach, without wall thickening. Normal abdominal bowel loops. Vascular/Lymphatic: Aortic atherosclerosis. No arterial involvement by tumor. The SMV is adjacent to presumed tumor without encasement, including on 51/23. Portal vein uninvolved. No abdominal adenopathy. A left periaortic 7 mm node on 29/5 is not pathologic by size criteria. Other: No ascites. Subtle left-sided pericolonic nodule of 5 mm on 36/23. Musculoskeletal: No acute osseous abnormality. IMPRESSION: 1. 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 2. No evidence of hepatic metastasis or vascular encasement. The SMV likely contacts tumor over a significantly less than 180 degree span. 3. Minimal left-sided pericolonic nodularity is felt unlikely to represent peritoneal isolated metastasis. Possibly the sequelae of colonic diverticulosis or even a reactive node. Recommend attention on follow-up. 4. Cholelithiasis. Electronically Signed   By: KAbigail MiyamotoM.D.   On: 11/06/2020 13:49   DG ERCP BILIARY & PANCREATIC DUCTS  Result Date: 11/11/2020 CLINICAL DATA:  ERCP. EXAM: ERCP TECHNIQUE: Multiple spot images obtained with the fluoroscopic device and submitted for interpretation post-procedure. FLUOROSCOPY TIME:  Fluoroscopy Time:  6 minutes and 2 seconds. Radiation Exposure Index (if provided by the  fluoroscopic device): None provided Number of Acquired Spot Images: 6 COMPARISON:  MR abdomen, 11/06/2020. FINDINGS: Multiple, limited oblique planar images of the RIGHT upper quadrant demonstrating endoscopy and common bile duct cannulation. IMPRESSION: Fluoroscopic imaging for ERCP. Please see performing service dictation for complete description of intraprocedural findings. Electronically Signed   By: JMichaelle BirksM.D.   On: 11/11/2020 16:19    Labs:  CBC: Recent Labs    09/03/20 1004 10/20/20 1204 11/09/20 1600 11/10/20 0445 11/11/20 1701 11/11/20 2345  WBC 9.0 6.2 8.1 8.2  --   --   HGB 14.1 13.1 12.8* 11.1* 11.4* 10.6*  HCT 41.1 39.5 36.4* 31.9* 33.1* 30.1*  PLT 190.0 169.0 292 262  --   --     COAGS: Recent Labs    11/10/20 1019  INR 0.9    BMP: Recent Labs    11/09/20 1600 11/10/20 0445 11/11/20 0753 11/12/20 0743  NA 134* 137 133* 135  K 3.9 3.4* 3.7 3.8  CL 101 103 101 101  CO2 23 25 23 24   GLUCOSE 285* 148* 177* 199*  BUN 20 20 16 21   CALCIUM 9.8 9.2 9.2 8.9  CREATININE 1.04 0.99 0.80 0.94  GFRNONAA >60 >60 >60 >60  LIVER FUNCTION TESTS: Recent Labs    11/09/20 1600 11/10/20 0445 11/11/20 0753 11/12/20 0743  BILITOT 16.4* 14.5* 15.6* 16.5*  AST 74* 61* 58* 62*  ALT 75* 63* 58* 52*  ALKPHOS 389* 318* 311* 311*  PROT 7.5 6.3* 5.3* 5.8*  ALBUMIN 3.7 3.0* 2.6* 2.7*    TUMOR MARKERS: No results for input(s): AFPTM, CEA, CA199, CHROMGRNA in the last 8760 hours.  Assessment and Plan: 70 y.o. male with past medical history of back pain, diabetes, hearing deficit, hyperlipidemia, hypertension, insomnia, coronary artery disease on chronic Plavix (last dose on 8/18) who was admitted to [facility] on 8/21 with history of weight loss, abnormal LFTs, obstructive jaundice and pancreatic mass.  EUS revealed mass in pancreatic head and biopsy was performed on 8/23- path pending.  Patient underwent ERCP on 8/23 as well but unable to obtain biliary  access.  Needle-knife sphincterotomy/fistulotomy was performed with some minor bleeding requiring epinephrine injection.  Request now received for PTC with biliary drain placement.  He is currently afebrile, creatinine normal, total bilirubin 16.5, PT/INR normal, CA 19-9 is 1,641, latest hemoglobin yesterday 10.6, platelets normal.  Imaging studies have been reviewed by Dr. Anselm Pancoast.  Details/risks of procedure, including but not limited to, internal bleeding, infection, injury to adjacent structures discussed with patient with his understanding and consent.  Procedure scheduled for today.   Thank you for this interesting consult.  I greatly enjoyed meeting ATREUS HASZ and look forward to participating in their care.  A copy of this report was sent to the requesting provider on this date.  Electronically Signed: D. Rowe Robert, PA-C 11/12/2020, 9:55 AM   I spent a total of   30 minutes  in face to face in clinical consultation, greater than 50% of which was counseling/coordinating care for percutaneous transhepatic cholangiogram with biliary drain placement

## 2020-11-12 NOTE — Procedures (Signed)
Interventional Radiology Procedure:   Indications: Biliary obstruction, pancreatic lesion   Procedure: PTC and placement of internal/external biliary drain  Findings: Severe biliary dilatation.  Placement of 10 Fr drain in right hepatic duct.  Tip in duodenum.  CBD obstruction.   Complications: No immediate complications noted.     EBL: Minimal  Plan: Follow output and labs.  Hopefully we can cap drain soon.  Sent fluid for analysis.    Hartlee Amedee R. Anselm Pancoast, MD  Pager: [phone]

## 2020-11-13 ENCOUNTER — Inpatient Hospital Stay (HOSPITAL_COMMUNITY): Payer: Medicare Other

## 2020-11-13 ENCOUNTER — Encounter: Payer: Self-pay | Admitting: Gastroenterology

## 2020-11-13 ENCOUNTER — Other Ambulatory Visit: Payer: Self-pay

## 2020-11-13 DIAGNOSIS — C259 Malignant neoplasm of pancreas, unspecified: Secondary | ICD-10-CM | POA: Diagnosis not present

## 2020-11-13 DIAGNOSIS — K8689 Other specified diseases of pancreas: Secondary | ICD-10-CM | POA: Diagnosis not present

## 2020-11-13 DIAGNOSIS — K831 Obstruction of bile duct: Secondary | ICD-10-CM | POA: Diagnosis not present

## 2020-11-13 LAB — COMPREHENSIVE METABOLIC PANEL
ALT: 51 U/L — ABNORMAL HIGH (ref 0–44)
ALT: 52 U/L — ABNORMAL HIGH (ref 0–44)
AST: 56 U/L — ABNORMAL HIGH (ref 15–41)
AST: 62 U/L — ABNORMAL HIGH (ref 15–41)
Albumin: 2.6 g/dL — ABNORMAL LOW (ref 3.5–5.0)
Albumin: 2.6 g/dL — ABNORMAL LOW (ref 3.5–5.0)
Alkaline Phosphatase: 281 U/L — ABNORMAL HIGH (ref 38–126)
Alkaline Phosphatase: 283 U/L — ABNORMAL HIGH (ref 38–126)
Anion gap: 11 (ref 5–15)
Anion gap: 9 (ref 5–15)
BUN: 17 mg/dL (ref 8–23)
BUN: 18 mg/dL (ref 8–23)
CO2: 25 mmol/L (ref 22–32)
CO2: 26 mmol/L (ref 22–32)
Calcium: 8.9 mg/dL (ref 8.9–10.3)
Calcium: 8.9 mg/dL (ref 8.9–10.3)
Chloride: 101 mmol/L (ref 98–111)
Chloride: 102 mmol/L (ref 98–111)
Creatinine, Ser: 1.08 mg/dL (ref 0.61–1.24)
Creatinine, Ser: 1.1 mg/dL (ref 0.61–1.24)
GFR, Estimated: >60 mL/min (ref >60)
GFR, Estimated: >60 mL/min (ref >60)
Glucose, Bld: 117 mg/dL — ABNORMAL HIGH (ref 70–99)
Glucose, Bld: 122 mg/dL — ABNORMAL HIGH (ref 70–99)
Potassium: 3.3 mmol/L — ABNORMAL LOW (ref 3.5–5.1)
Potassium: 3.8 mmol/L (ref 3.5–5.1)
Sodium: 137 mmol/L (ref 135–145)
Sodium: 137 mmol/L (ref 135–145)
Total Bilirubin: 12.8 mg/dL — ABNORMAL HIGH (ref 0.3–1.2)
Total Bilirubin: 13.3 mg/dL — ABNORMAL HIGH (ref 0.3–1.2)
Total Protein: 5.6 g/dL — ABNORMAL LOW (ref 6.5–8.1)
Total Protein: 5.6 g/dL — ABNORMAL LOW (ref 6.5–8.1)

## 2020-11-13 LAB — GLUCOSE, CAPILLARY
Glucose-Capillary: 111 mg/dL — ABNORMAL HIGH (ref 70–99)
Glucose-Capillary: 124 mg/dL — ABNORMAL HIGH (ref 70–99)

## 2020-11-13 LAB — CBC
HCT: 29.6 % — ABNORMAL LOW (ref 39.0–52.0)
Hemoglobin: 10.1 g/dL — ABNORMAL LOW (ref 13.0–17.0)
MCH: 30 pg (ref 26.0–34.0)
MCHC: 34.1 g/dL (ref 30.0–36.0)
MCV: 87.8 fL (ref 80.0–100.0)
Platelets: 242 10*3/uL (ref 150–400)
RBC: 3.37 MIL/uL — ABNORMAL LOW (ref 4.22–5.81)
RDW: 20.5 % — ABNORMAL HIGH (ref 11.5–15.5)
WBC: 7.4 10*3/uL (ref 4.0–10.5)
nRBC: 0 % (ref 0.0–0.2)

## 2020-11-13 LAB — SURGICAL PATHOLOGY

## 2020-11-13 LAB — HEMOGLOBIN AND HEMATOCRIT, BLOOD
HCT: 29.2 % — ABNORMAL LOW (ref 39.0–52.0)
Hemoglobin: 10.1 g/dL — ABNORMAL LOW (ref 13.0–17.0)

## 2020-11-13 LAB — PANCREATIC ELASTASE, FECAL: Pancreatic Elastase-1, Stool: <15 mcg/g — ABNORMAL LOW

## 2020-11-13 IMAGING — CT CT PELVIS W/ CM
3 of 4 series · 11 of 46 positions shown, 18 images · IV contrast (APPLIED)
Comparison: [DATE]

CLINICAL DATA: Pancreatic cancer, staging

EXAM:
CT PELVIS WITH CONTRAST
TECHNIQUE: Multidetector CT imaging of the pelvis was performed using the
standard protocol following the bolus administration of intravenous
contrast.
CONTRAST:  80mL OMNIPAQUE IOHEXOL 350 MG/ML SOLN

[Series 2: axial st · axial · 0.75mm/px · z∈[+1087,+1302]mm · 7 of 59 slices shown, 12 images]
[im 8/59  soft-tissue]
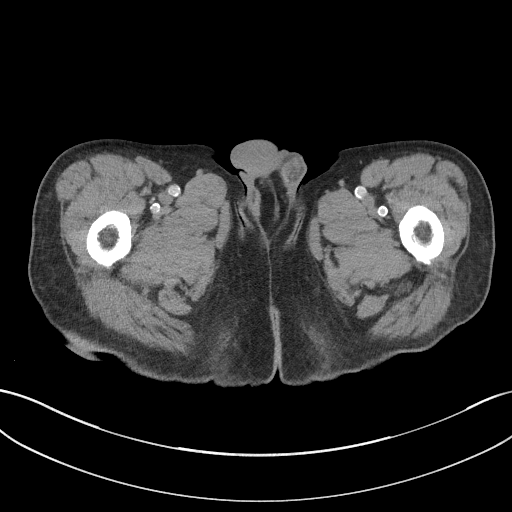
[im 8/59  bone]
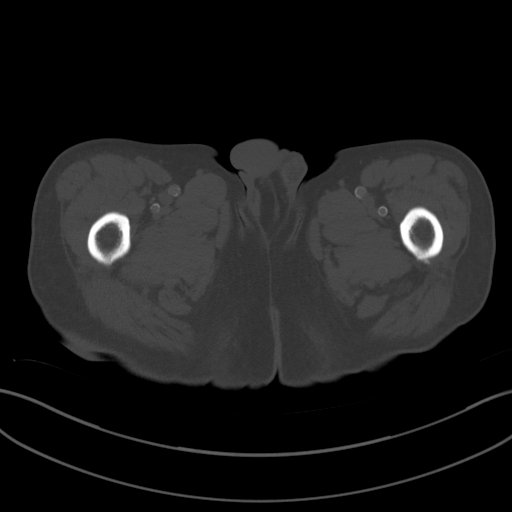
[im 15/59  soft-tissue]
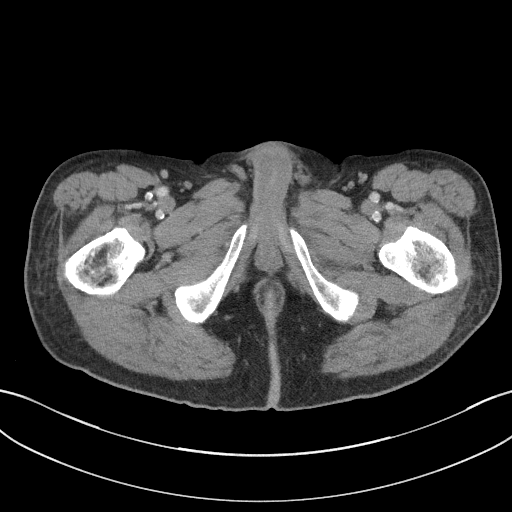
[im 22/59  soft-tissue]
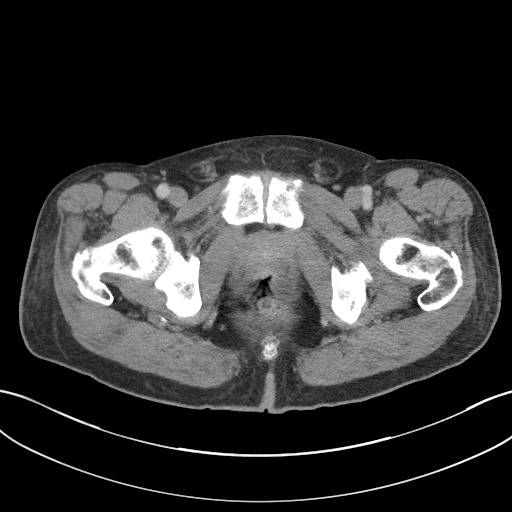
[im 30/59  soft-tissue]
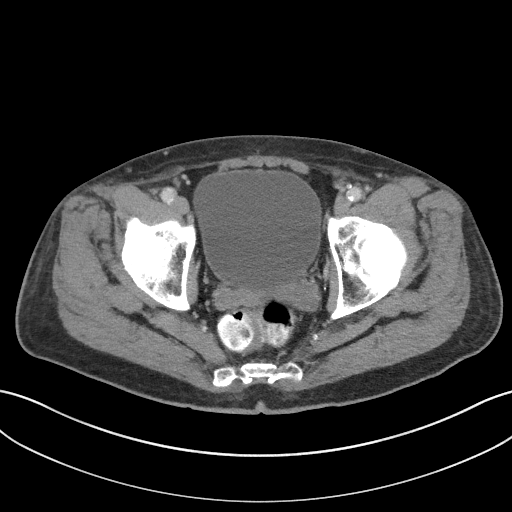
[im 30/59  lung]
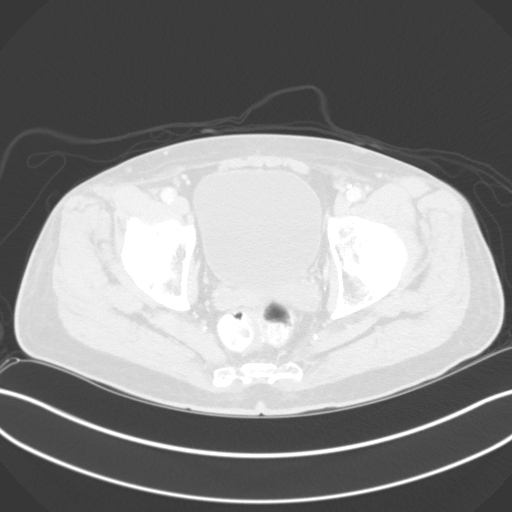
[im 37/59  soft-tissue]
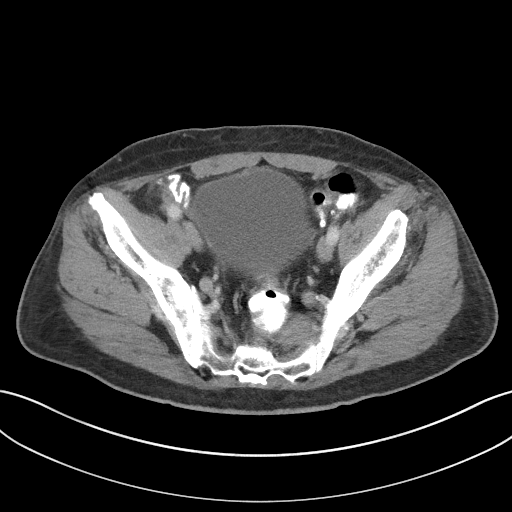
[im 37/59  lung]
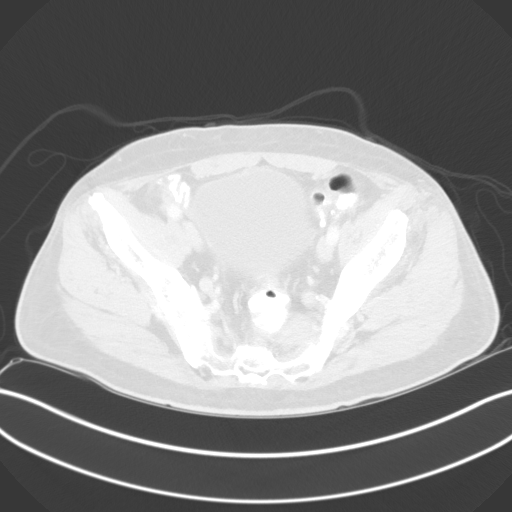
[im 44/59  soft-tissue]
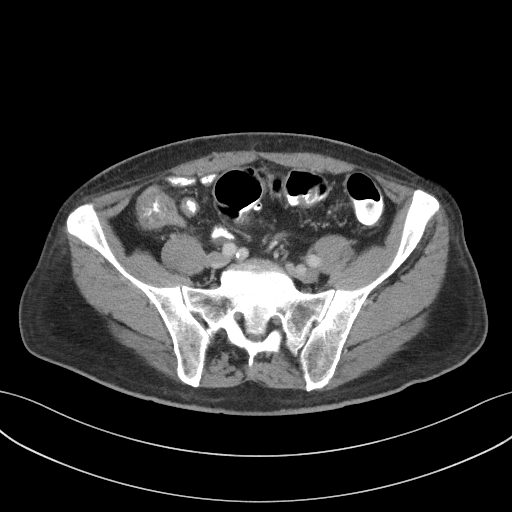
[im 44/59  lung]
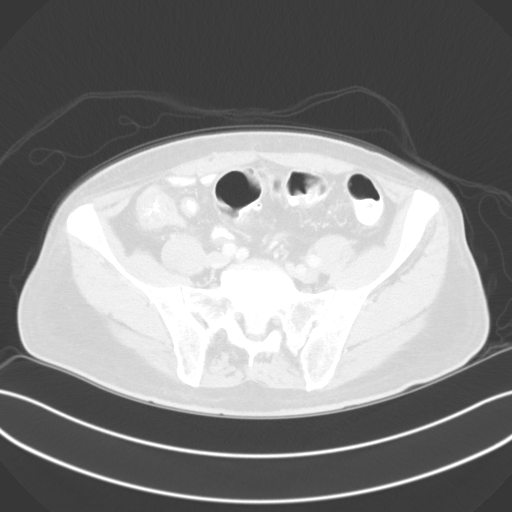
[im 51/59  soft-tissue]
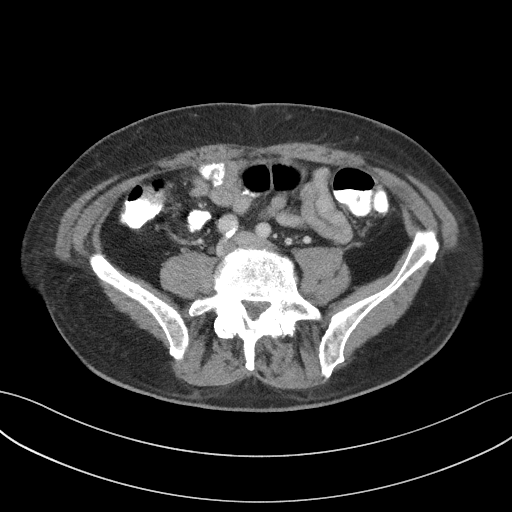
[im 51/59  lung]
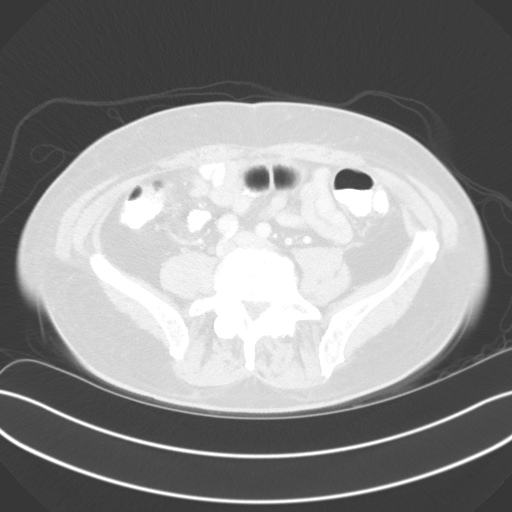

[Series 5: coronal st · coronal · 0.59mm/px · 3 of 84 slices shown, 4 images]
[im 28/84  soft-tissue]
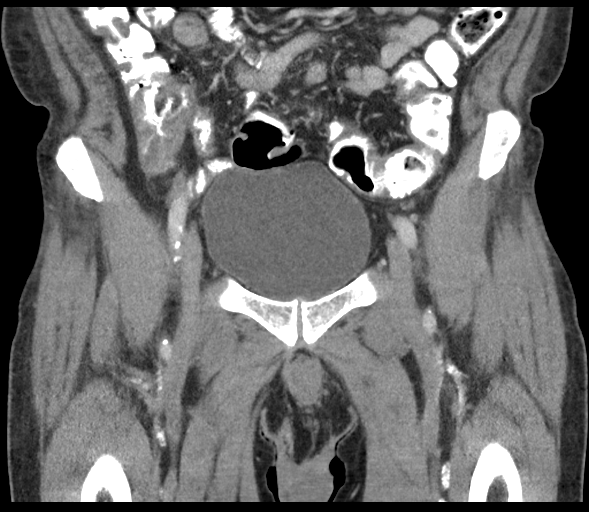
[im 37/84  soft-tissue]
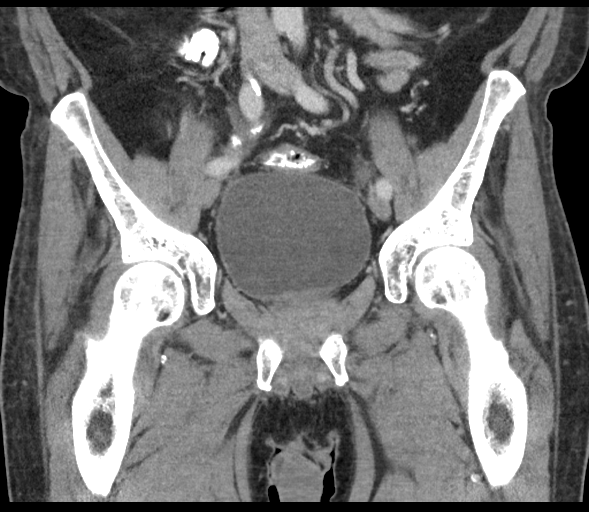
[im 37/84  bone]
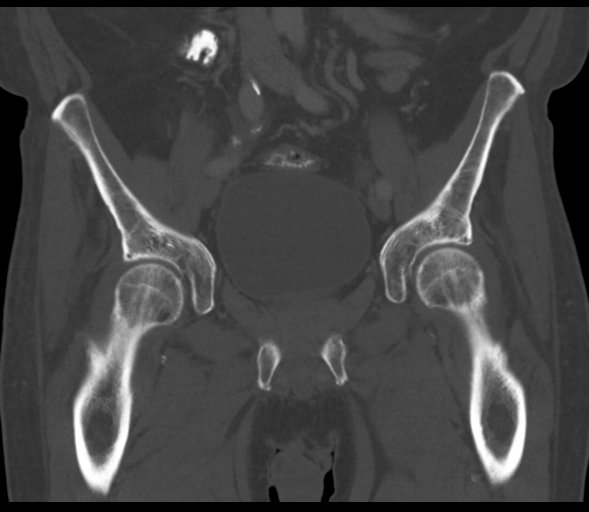
[im 47/84  soft-tissue]
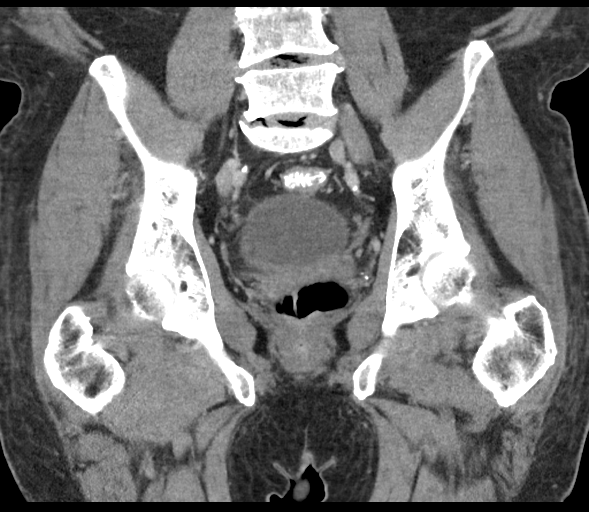

[Series 6: sagittal st · sagittal · 0.49mm/px · 1 of 117 slices shown, 2 images]
[im 39/117  soft-tissue]
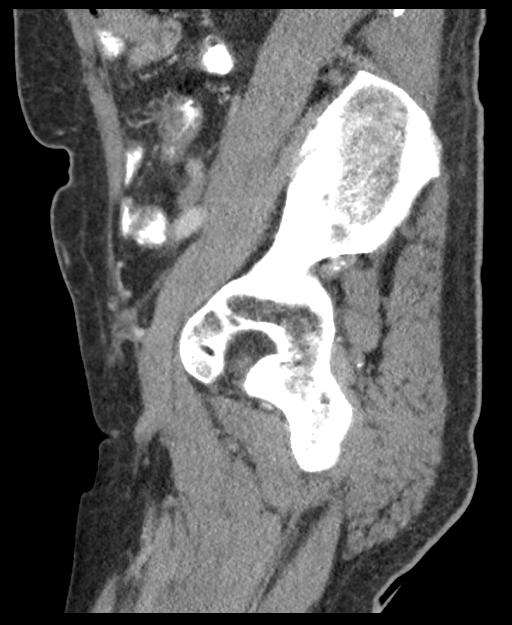
[im 39/117  bone]
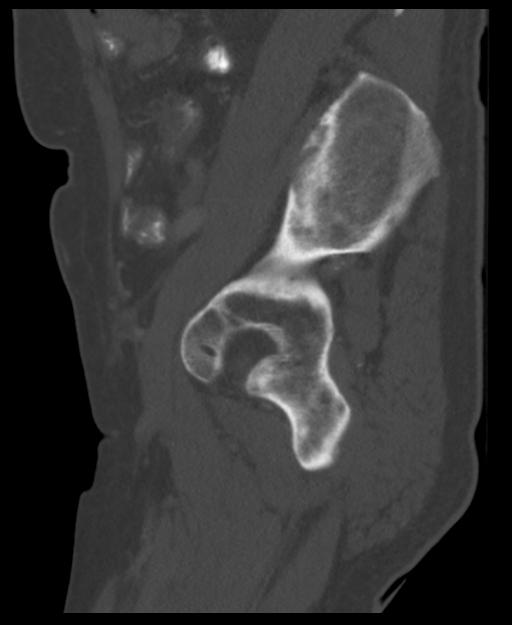

[11 of 46 positions shown; findings below may reference images not displayed]

FINDINGS: Urinary Tract: Distal ureters and bladder are unremarkable. No
urinary tract calculi.

Bowel: There is circumferential mural thickening of the cecum,
measuring up to 12 mm, nonspecific. No other areas of bowel wall
thickening or inflammatory change. No bowel obstruction or ileus.
Normal caliber appendix right lower quadrant.

Vascular/Lymphatic: Atherosclerosis the identified within the distal
aorta and its branches. No pathologic adenopathy visualized within
the pelvis.

Reproductive:  Prostate is unremarkable.

Other:  No free fluid or free gas.  No abdominal wall hernia.

Musculoskeletal: No acute or destructive bony lesions. There is
severe spondylosis partially visualized at L3-4. Reconstructed
images demonstrate no additional findings.
IMPRESSION: 1. Nonspecific circumferential wall thickening of the cecum,
measuring up to 12 mm in thickness. Correlation with colonoscopy may
be useful to exclude underlying neoplasm. No surrounding
inflammatory changes.
2. Otherwise no evidence of intrapelvic metastases.
3.  Aortic Atherosclerosis ([7J]-[7J]).

## 2020-11-13 IMAGING — CT CT CHEST W/ CM
2 of 4 series · 15 of 36 positions shown, 18 images · IV contrast (omnipaque)
Comparison: [DATE]

CLINICAL DATA: Pancreatic cancer

EXAM:
CT CHEST WITH CONTRAST
TECHNIQUE: Multidetector CT imaging of the chest was performed during
intravenous contrast administration.
CONTRAST:  80mL OMNIPAQUE IOHEXOL 350 MG/ML SOLN

[Series 2: axial st · axial · 0.74mm/px · z∈[+1450,+1704]mm · 12 of 151 slices shown, 15 images]
[im 12/151  mediastinal]
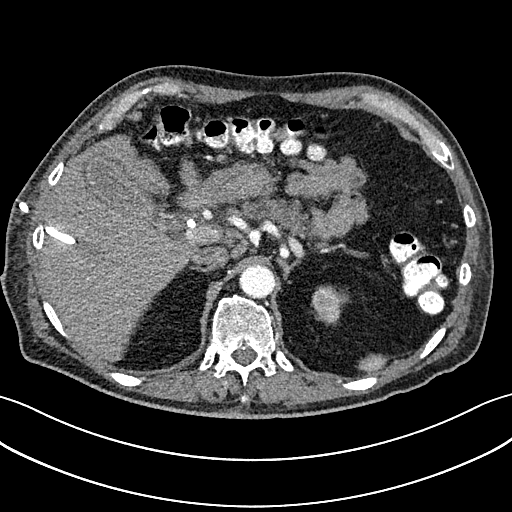
[im 12/151  lung]
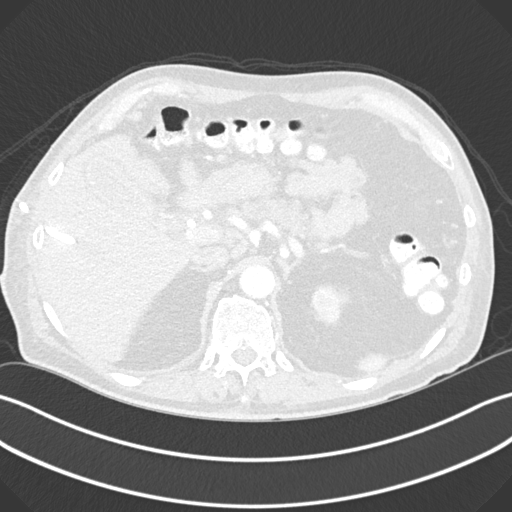
[im 24/151  lung]
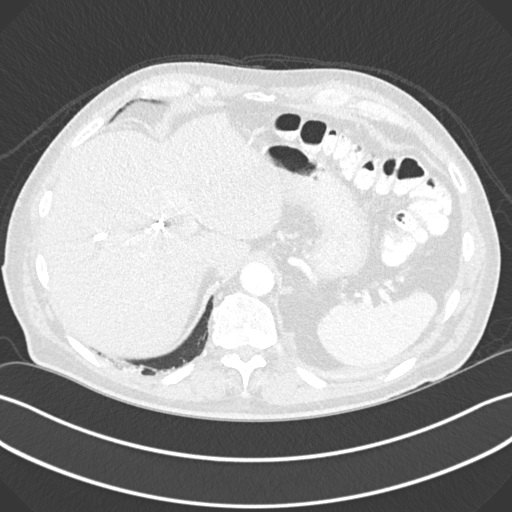
[im 35/151  lung]
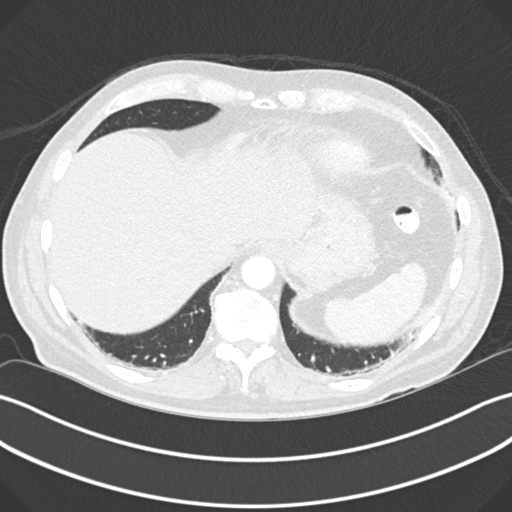
[im 47/151  lung]
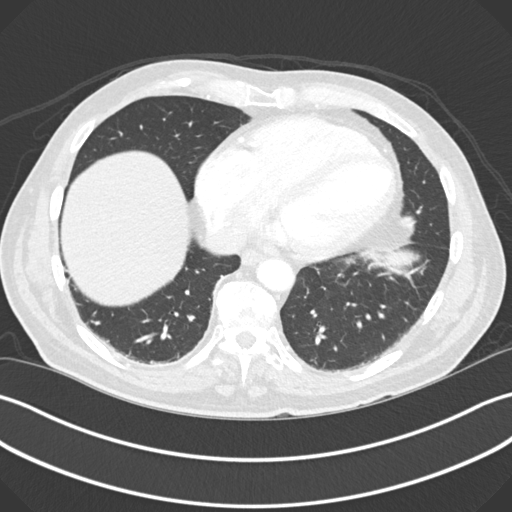
[im 58/151  mediastinal]
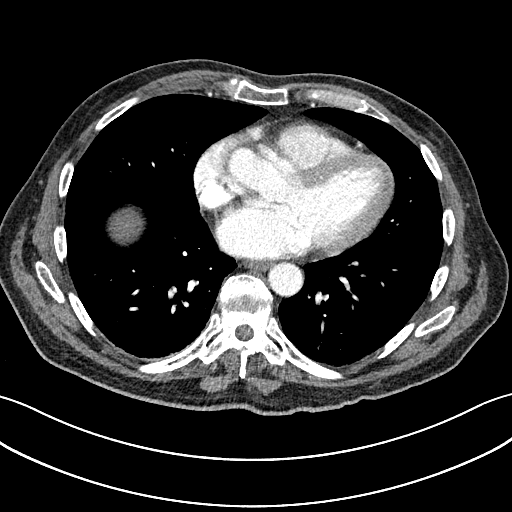
[im 58/151  lung]
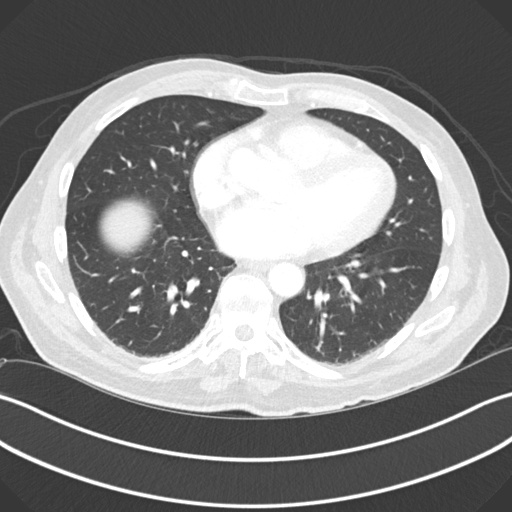
[im 70/151  lung]
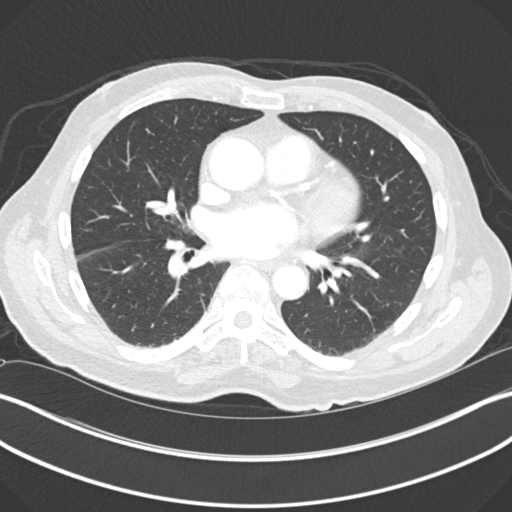
[im 81/151  lung]
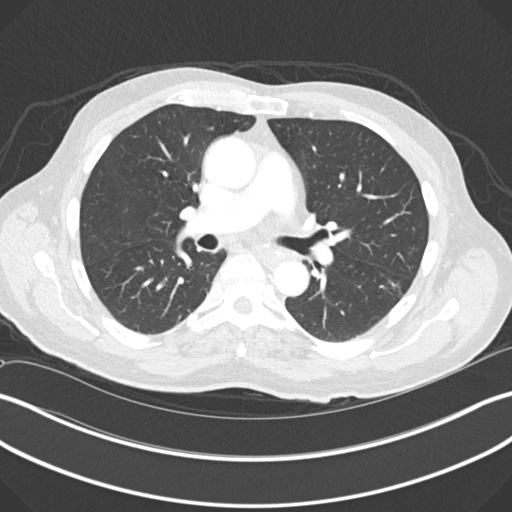
[im 93/151  lung]
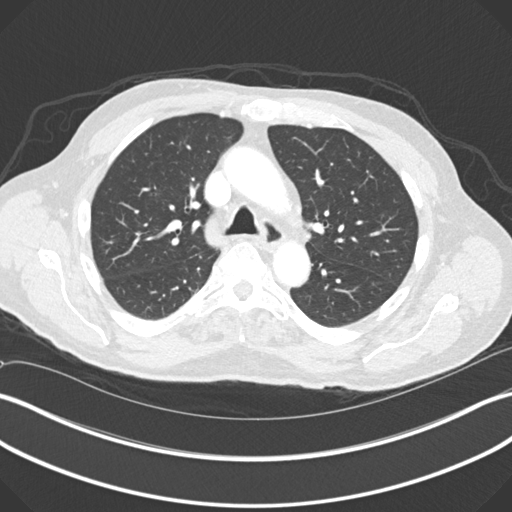
[im 104/151  mediastinal]
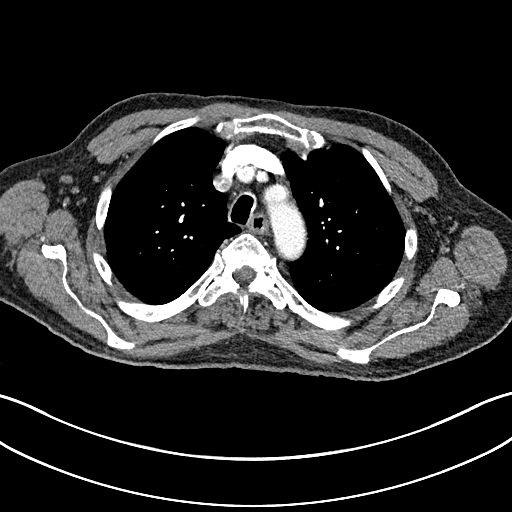
[im 104/151  lung]
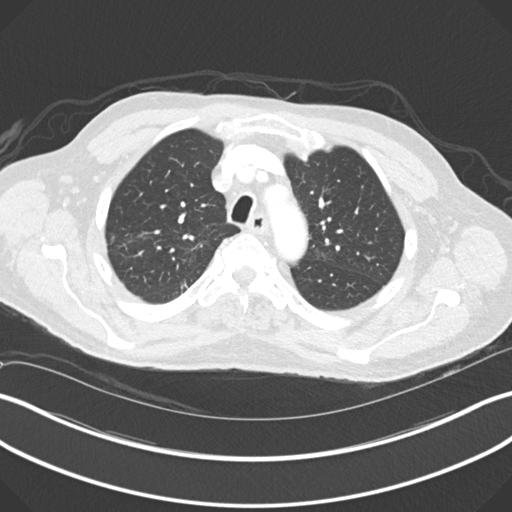
[im 116/151  lung]
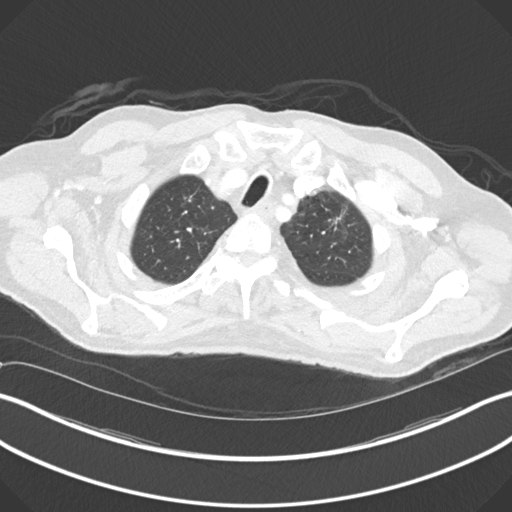
[im 127/151  lung]
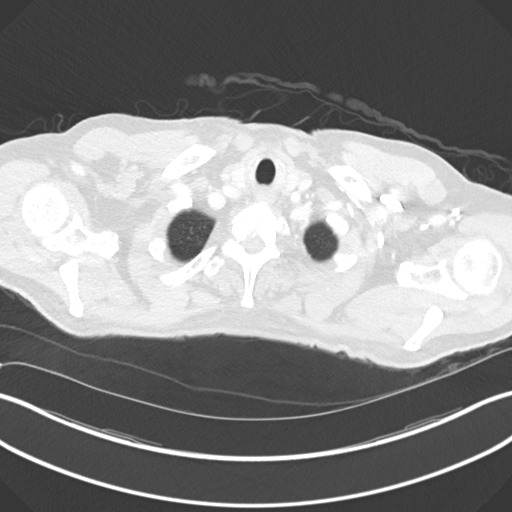
[im 139/151  lung]
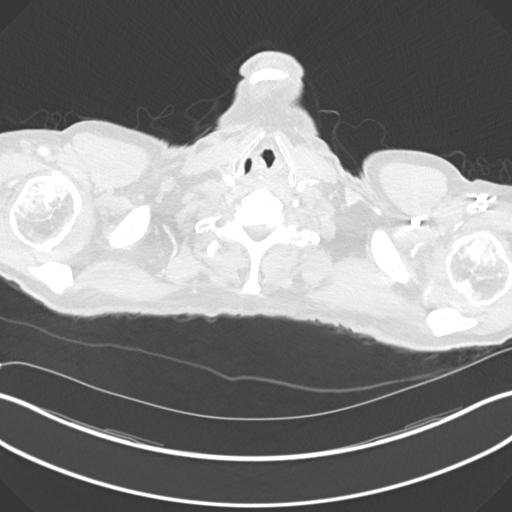

[Series 6: coronal · coronal · 0.62mm/px · 3 of 134 slices shown]
[im 27/134  lung]
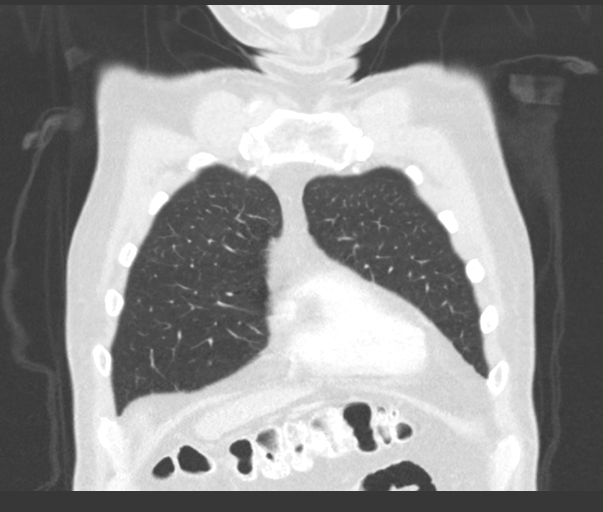
[im 54/134  lung]
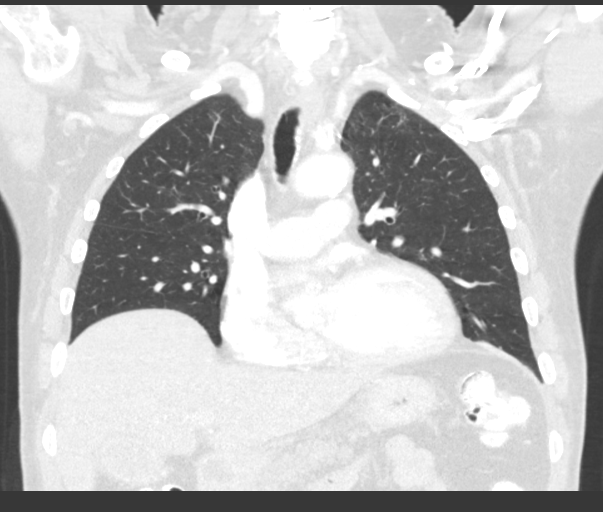
[im 80/134  lung]
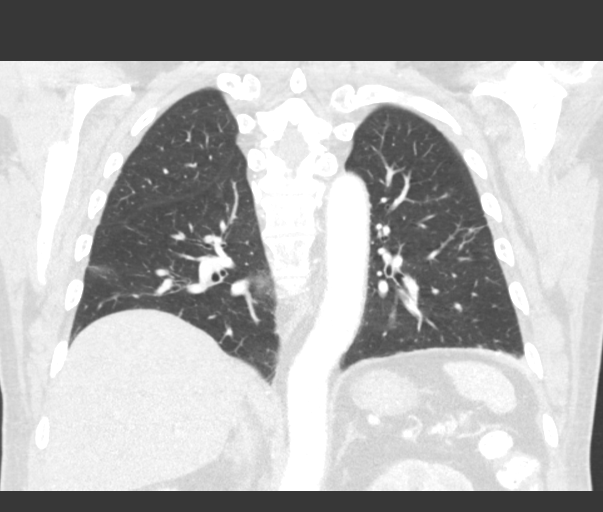

[15 of 36 positions shown; findings below may reference images not displayed]

FINDINGS: Cardiovascular: The heart and great vessels are unremarkable without
pericardial effusion. No evidence of thoracic aortic aneurysm or
dissection. Mild atherosclerosis of the aorta and coronary
vasculature. No evidence of pulmonary embolus.

Mediastinum/Nodes: No enlarged mediastinal, hilar, or axillary lymph
nodes. Thyroid gland, trachea, and esophagus demonstrate no
significant findings.

Lungs/Pleura: Scattered areas of scarring are seen within the upper
lobes. Minimal hypoventilatory change at the left lung base. No
acute airspace disease, effusion, or pneumothorax. No pulmonary
nodules or masses. The central airways are widely patent.

Upper Abdomen: Percutaneous trans hepatic biliary drain is
identified. There is mild gallbladder wall thickening, with
intraluminal high attenuation possibly representing contrast
introduced during biliary procedure. Pancreatic duct dilation again
noted. The pancreatic head mass seen on previous MRI is not included
due to slice selection.

Musculoskeletal: No acute or destructive bony lesions. Reconstructed
images demonstrate no additional findings.
IMPRESSION: 1. No acute intrathoracic metastases.
2. Percutaneous trans hepatic biliary drain.
3. Nonspecific gallbladder wall thickening.
4. Pancreatic duct dilation. The pancreatic head mass seen on
previous MRI is not included due to slice selection.
5.  Aortic Atherosclerosis ([JO]-[JO]).

## 2020-11-13 MED ORDER — SERTRALINE HCL 100 MG PO TABS: 100.0000 mg | ORAL_TABLET | Freq: Every day | ORAL | 0 refills | Status: DC

## 2020-11-13 MED ORDER — IOHEXOL 9 MG/ML PO SOLN
ORAL | Status: AC
  Administered 2020-11-13: 1000 mL via ORAL
  Filled 2020-11-13: qty 1000

## 2020-11-13 MED ORDER — IOHEXOL 9 MG/ML PO SOLN
500.0000 mL | ORAL | Status: AC
Start: 2020-11-13 — End: 2020-11-13
  Administered 2020-11-13: 500 mL via ORAL

## 2020-11-13 MED ORDER — SALINE FLUSH 0.9 % IV SOLN: 10.0000 mL | Freq: Every day | INTRAVENOUS | 0 refills | Status: DC

## 2020-11-13 MED ORDER — IOHEXOL 350 MG/ML SOLN
100.0000 mL | Freq: Once | INTRAVENOUS | Status: AC | PRN
  Administered 2020-11-13: 80 mL via INTRAVENOUS

## 2020-11-13 MED ORDER — CLOPIDOGREL BISULFATE 75 MG PO TABS: 75.0000 mg | ORAL_TABLET | Freq: Every day | ORAL | Status: DC

## 2020-11-13 MED ORDER — ALPRAZOLAM 0.25 MG PO TABS: 0.2500 mg | ORAL_TABLET | Freq: Every evening | ORAL | 0 refills | Status: DC | PRN

## 2020-11-13 NOTE — Discharge Instructions (Signed)
Tips For Adding Protein Nutrition Therapy  Patients may be advised to increase the protein in their diet but not necessarily the calories as well. However, note that when adding protein to your diet, you will also be adding extra calories. The following suggestions may help add the extra protein while keeping the calories as low as possible.  Tips Add extra egg to one or more meals  Increase the portion of milk to drink and change to skim milk if able  Include [place] yogurt or cottage cheese for snack or part of a meal  Increase portion size of protein entre and decrease portion of starch/bread  Mix protein powder, nut butter, almond/nut milk, non-fat dry milk, or Greek yogurt to shakes and smoothies  Use these ingredients also in baked goods or other recipes Use double the amount of sandwich filling  Add protein foods to all snacks including cheese, nut butters, milk and yogurt Food Tips for Including Protein  Beans Cook and use dried peas, beans, and tofu in soups or add to casseroles, pastas, and grain dishes that also contain cheese or meat  Mash with cheese and milk  Use tofu to make smoothies  Commercial Protein Supplements Use nutritional supplements or protein powder sold at pharmacies and grocery stores  Use protein powder in milk drinks and desserts, such as pudding  Mix with ice cream, milk, and fruit or other flavorings for a high-protein milkshake  Cottage Cheese or [employer] Mix with or use to stuff fruits and vegetables  Add to casseroles, spaghetti, noodles, or egg dishes such as omelets, scrambled eggs, and souffls  Use gelatin, pudding-type desserts, cheesecake, and pancake or waffle batter  Use to stuff crepes, pasta shells, or manicotti  Puree and use as a substitute for sour cream  Eggs, Egg whites, and Egg Yolks Add chopped, hard-cooked eggs to salads and dressings, vegetables, casseroles, and creamed meats  Beat eggs into mashed potatoes, vegetable purees, and  sauces  Add extra egg whites to quiches, scrambled eggs, custards, puddings, pancake batter, or [place] toast wash/batter  Make a rich custard with egg yolks, double strength milk, and sugar  Add extra hard-cooked yolks to deviled egg filling and sandwich spreads  Hard or Semi-Soft Cheese (Cheddar, Barnabas Lister, [place]) Melt on sandwiches, bread, muffins, tortillas, hamburgers, hot dogs, other meats or fish, vegetables, eggs, or desserts such as stewed figs or pies  Grate and add to soups, sauces, casseroles, vegetable dishes, potatoes, rice noodles, or meatloaf  Serve as a snack with crackers or bagels  Ice cream, Yogurt, and Frozen Yogurt Add to milk drinks such as milkshakes  Add to cereals, fruits, gelatin desserts, and pies  Blend or whip with soft or cooked fruits  Sandwich ice cream or frozen yogurt between enriched cake slices, cookies, or graham crackers  Use seasoned yogurt as a dip for fruits, vegetables, or chips  Use yogurt in place of sour cream in casseroles  Meat and Fish Add chopped, cooked meat or fish to vegetables, salads, casseroles, soups, sauces, and biscuit dough  Use in omelets, souffls, quiches, and sandwich fillings  Add chicken and [place] to stuffing  Wrap in pie crust or biscuit dough as turnovers  Add to stuffed baked potatoes  Add pureed meat to soups  Milk Use in beverages and in cooking  Use in preparing foods, such as hot cereal, soups, cocoa, or pudding  Add cream sauces to vegetable and other dishes  Use evaporated milk, evaporated skim milk, or sweetened condensed  milk instead of milk or water in recipes.  Nonfat Dry Milk Add 1/3 cup of nonfat dry milk powdered milk to each cup of regular milk for "double strength" milk  Add to yogurt and milk drinks, such as pasteurized eggnog and milkshakes  Add to scrambled eggs and mashed potatoes  Use in casseroles, meatloaf, hot cereal, breads, muffins, sauces, cream soups, puddings and custards, and other milk-based  desserts  Nuts, Seeds, and Wheat Germ Add to casseroles, breads, muffins, pancakes, cookies, and waffles  Sprinkle on fruit, cereal, ice cream, yogurt, vegetables, salads, and toast as a crunchy topping  Use in place of breadcrumbs  Blend with parsley or spinach, herbs, and cream for a noodle, pasta, or vegetable sauce.  Roll banana in chopped nuts  Peanut Butter Spread on sandwiches, toast, muffins, crackers, waffles, pancakes, and fruit slices  Use as a dip for raw vegetables, such as carrots, cauliflower, and celery  Blend with milk drinks, smoothies, and other beverages  Swirl through soft ice cream or yogurt  Spread on a banana then roll in crushed, dry cereal or chopped nuts   Copyright 2020  [employer]. All rights reserved   Carbohydrate Counting For People With Diabetes  Foods with carbohydrates make your blood glucose level go up. Learning how to count carbohydrates can help you control your blood glucose levels. First, identify the foods you eat that contain carbohydrates. Then, using the Foods with Carbohydrates chart, determine about how much carbohydrates are in your meals and snacks. Make sure you are eating foods with fiber, protein, and healthy fat along with your carbohydrate foods. Foods with Carbohydrates The following table shows carbohydrate foods that have about 15 grams of carbohydrate each. Using measuring cups, spoons, or a food scale when you first begin learning about carbohydrate counting can help you learn about the portion sizes you typically eat. The following foods have 15 grams carbohydrate each:  Grains 1 slice bread (1 ounce)  1 small tortilla (6-inch size)   large bagel (1 ounce)  1/3 cup pasta or rice (cooked)   hamburger or hot dog bun ( ounce)   cup cooked cereal   to  cup ready-to-eat cereal  2 taco shells (5-inch size) Fruit 1 small fresh fruit ( to 1 cup)   medium banana  17 small grapes (3 ounces)  1 cup melon  or berries   cup canned or frozen fruit  2 tablespoons dried fruit (blueberries, cherries, cranberries, raisins)   cup unsweetened fruit juice  Starchy Vegetables  cup cooked beans, peas, corn, potatoes/sweet potatoes   large baked potato (3 ounces)  1 cup acorn or butternut squash  Snack Foods 3 to 6 crackers  8 potato chips or 13 tortilla chips ( ounce to 1 ounce)  3 cups popped popcorn  Dairy 3/4 cup (6 ounces) nonfat plain yogurt, or yogurt with sugar-free sweetener  1 cup milk  1 cup plain rice, soy, coconut or flavored almond milk Sweets and Desserts  cup ice cream or frozen yogurt  1 tablespoon jam, jelly, pancake syrup, table sugar, or honey  2 tablespoons light pancake syrup  1 inch square of frosted cake or 2 inch square of unfrosted cake  2 small cookies (2/3 ounce each) or  large cookie  Sometimes you'll have to estimate carbohydrate amounts if you don't know the exact recipe. One cup of mixed foods like soups can have 1 to 2 carbohydrate servings, while some casseroles might have 2 or more  servings of carbohydrate. Foods that have less than 20 calories in each serving can be counted as "free" foods. Count 1 cup raw vegetables, or  cup cooked non-starchy vegetables as "free" foods. If you eat 3 or more servings at one meal, then count them as 1 carbohydrate serving.  Foods without Carbohydrates  Not all foods contain carbohydrates. Meat, some dairy, fats, non-starchy vegetables, and many beverages don't contain carbohydrate. So when you count carbohydrates, you can generally exclude chicken, pork, beef, fish, seafood, eggs, tofu, cheese, butter, sour cream, avocado, nuts, seeds, olives, mayonnaise, water, black coffee, unsweetened tea, and zero-calorie drinks. Vegetables with no or low carbohydrate include green beans, cauliflower, tomatoes, and onions. How much carbohydrate should I eat at each meal?  Carbohydrate counting can help you plan your meals and manage your  weight. Following are some starting points for carbohydrate intake at each meal. Work with your registered dietitian nutritionist to find the best range that works for your blood glucose and weight.   To Lose Weight To Maintain Weight  Women 2 - 3 carb servings 3 - 4 carb servings  Men 3 - 4 carb servings 4 - 5 carb servings  Checking your blood glucose after meals will help you know if you need to adjust the timing, type, or number of carbohydrate servings in your meal plan. Achieve and keep a healthy body weight by balancing your food intake and physical activity.  Tips How should I plan my meals?  Plan for half the food on your plate to include non-starchy vegetables, like salad greens, broccoli, or carrots. Try to eat 3 to 5 servings of non-starchy vegetables every day. Have a protein food at each meal. Protein foods include chicken, fish, meat, eggs, or beans (note that beans contain carbohydrate). These two food groups (non-starchy vegetables and proteins) are low in carbohydrate. If you fill up your plate with these foods, you will eat less carbohydrate but still fill up your stomach. Try to limit your carbohydrate portion to  of the plate.  What fats are healthiest to eat?  Diabetes increases risk for heart disease. To help protect your heart, eat more healthy fats, such as olive oil, nuts, and avocado. Eat less saturated fats like butter, cream, and high-fat meats, like bacon and sausage. Avoid trans fats, which are in all foods that list "partially hydrogenated oil" as an ingredient. What should I drink?  Choose drinks that are not sweetened with sugar. The healthiest choices are water, carbonated or seltzer waters, and tea and coffee without added sugars.  Sweet drinks will make your blood glucose go up very quickly. One serving of soda or energy drink is  cup. It is best to drink these beverages only if your blood glucose is low.  Artificially sweetened, or diet drinks, typically do not  increase your blood glucose if they have zero calories in them. Read labels of beverages, as some diet drinks do have carbohydrate and will raise your blood glucose. Label Reading Tips Read Nutrition Facts labels to find out how many grams of carbohydrate are in a food you want to eat. Don't forget: sometimes serving sizes on the label aren't the same as how much food you are going to eat, so you may need to calculate how much carbohydrate is in the food you are serving yourself.   Carbohydrate Counting for People with Diabetes Sample 1-Day Menu  Breakfast  cup yogurt, low fat, low sugar (1 carbohydrate serving)   cup cereal, ready-to-eat,  unsweetened (1 carbohydrate serving)  1 cup strawberries (1 carbohydrate serving)   cup almonds ( carbohydrate serving)  Lunch 1, 5 ounce can chunk light tuna  2 ounces cheese, low fat cheddar  6 whole wheat crackers (1 carbohydrate serving)  1 small apple (1 carbohydrate servings)   cup carrots ( carbohydrate serving)   cup snap peas  1 cup 1% milk (1 carbohydrate serving)   Evening Meal Stir fry made with: 3 ounces chicken  1 cup brown rice (3 carbohydrate servings)   cup broccoli ( carbohydrate serving)   cup green beans   cup onions  1 tablespoon olive oil  2 tablespoons teriyaki sauce ( carbohydrate serving)  Evening Snack 1 extra small banana (1 carbohydrate serving)  1 tablespoon peanut butter   Carbohydrate Counting for People with Diabetes Vegan Sample 1-Day Menu  Breakfast 1 cup cooked oatmeal (2 carbohydrate servings)   cup blueberries (1 carbohydrate serving)  2 tablespoons flaxseeds  1 cup soymilk fortified with calcium and vitamin D  1 cup coffee  Lunch 2 slices whole wheat bread (2 carbohydrate servings)   cup baked tofu   cup lettuce  2 slices tomato  2 slices avocado   cup baby carrots ( carbohydrate serving)  1 orange (1 carbohydrate serving)  1 cup soymilk fortified with calcium and vitamin D   Evening  Meal Burrito made with: 1 6-inch corn tortilla (1 carbohydrate serving)  1 cup refried vegetarian beans (2 carbohydrate servings)   cup chopped tomatoes   cup lettuce   cup salsa  1/3 cup brown rice (1 carbohydrate serving)  1 tablespoon olive oil for rice   cup zucchini   Evening Snack 6 small whole grain crackers (1 carbohydrate serving)  2 apricots ( carbohydrate serving)   cup unsalted peanuts ( carbohydrate serving)    Carbohydrate Counting for People with Diabetes Vegetarian (Lacto-Ovo) Sample 1-Day Menu  Breakfast 1 cup cooked oatmeal (2 carbohydrate servings)   cup blueberries (1 carbohydrate serving)  2 tablespoons flaxseeds  1 egg  1 cup 1% milk (1 carbohydrate serving)  1 cup coffee  Lunch 2 slices whole wheat bread (2 carbohydrate servings)  2 ounces low-fat cheese   cup lettuce  2 slices tomato  2 slices avocado   cup baby carrots ( carbohydrate serving)  1 orange (1 carbohydrate serving)  1 cup unsweetened tea  Evening Meal Burrito made with: 1 6-inch corn tortilla (1 carbohydrate serving)   cup refried vegetarian beans (1 carbohydrate serving)   cup tomatoes   cup lettuce   cup salsa  1/3 cup brown rice (1 carbohydrate serving)  1 tablespoon olive oil for rice   cup zucchini  1 cup 1% milk (1 carbohydrate serving)  Evening Snack 6 small whole grain crackers (1 carbohydrate serving)  2 apricots ( carbohydrate serving)   cup unsalted peanuts ( carbohydrate serving)    Copyright 2020  Academy [employer]. All rights reserved.  Using Nutrition Labels: Carbohydrate  Serving Size  Look at the serving size. All the information on the label is based on this portion. Servings Per Container  The number of servings contained in the package. Guidelines for Carbohydrate  Look at the total grams of carbohydrate in the serving size.  1 carbohydrate choice = 15 grams of carbohydrate. Range of Carbohydrate Grams Per Choice   Carbohydrate Grams/Choice Carbohydrate Choices  6-10   11-20 1  21-25 1  26-35 2  36-40 2  41-50 3  51-55 3  56-65 4  66-70 4  71-80 5    Copyright 2020  [employer]. All rights reserved.

## 2020-11-13 NOTE — Discharge Summary (Signed)
Physician Discharge Summary  Francisco Frank [id number] DOB: [birth year] DOA: 11/09/2020  PCP: Kennieth Rad, MD  Admit date: 11/09/2020 Discharge date: 11/13/2020  Admitted From:home Disposition:home  Recommendations for Outpatient Follow-up:  Follow up with PCP, radiology and GI in 1 weeks Please obtain CMP/CBC in one week Continue with your drain management of your biliary drainage as per interventional list instruction Please follow up on the following pending results: Biopsy result  Home Health:NO  Equipment/Devices: NONE  Discharge Condition: Stable Code Status:   Code Status: Full Code Diet recommendation:  Diet Order             DIET SOFT Room service appropriate? Yes; Fluid consistency: Thin  Diet effective now                    Brief/Interim Summary: 70 year old male with history of hypertension who has had GI evaluation found to have pancreatic mass suspected neoplasm and admitted for ERCP and EUS biopsy. He was recently seen by GI for 20 pound weight loss since April and labs showed normal liver enzyme and further work-up showed minimal stranding at pancreatic head cholelithiasis and continue to have weight loss and will continue to follow-up with GI and back in August beginning he had abnormal LFTs MRI of the abdomen showed pancreatic body mass. He was directly admitted for further work-up by GI. ERCP  8/23- unable to cannulate the bile duct, EUS shows localized severe mucosal changes in the duodenal- placed on ppi, found to have irregular mass in the pancreatic head, sonographic evidence suggesting invasion into the portal vein and SMV, fine-needle biopsy done.  Pending biopsy result.  CA 19-9 1641, ans s/p IR  percutaneous biliary drainage 8/24 Biliary tube draining well.  GI is okay with discharge after completing further CT scan and patient will follow up with IR for drain management. Discussed with GI CT report is pending but okay to d/c per GI. GI will f/u  the ct rerrot  Discharge Diagnoses:   Abnormal LFTs-obstructive jaundice Pancreatic head mass suspecting neoplasm:  ERCP-unable to cannulate the bile duct, EUS shows localized severe mucosal changes in the duodenal, irregular mass in the pancreatic head, sonographic evidence suggesting invasion into the portal vein and SMV, fine-needle biopsy done.  Pending biopsy result.  CA 19-9 1641,  s/p  IR for percutaneous biliary drainage.  Patient is further undergoing CT chest.  CBC will be checked today prior to discharge due to acute discharge and patient to follow-up with IR. Total bili downtrending.  Patient requesting Xanax to help with his symptoms and given for few days and he can discuss with PCP or GI for further Recent Labs  Lab 11/10/20 0445 11/10/20 1019 11/11/20 0753 11/12/20 0743 11/12/20 2344 11/13/20 0742  AST 61*  --  58* 62* 62* 56*  ALT 63*  --  58* 52* 51* 52*  ALKPHOS 318*  --  311* 311* 281* 283*  BILITOT 14.5*  --  15.6* 16.5* 13.3* 12.8*  PROT 6.3*  --  5.3* 5.8* 5.6* 5.6*  ALBUMIN 3.0*  --  2.6* 2.7* 2.6* 2.6*  INR  --  0.9  --   --   --   --     Hypokalemia resolved   Anemia likely from chronic disease.  Also some drop mild could be in the setting of sphincterotomy  as patient was on Plavix .  But hemoglobin has come up at baseline no acute blood loss. 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   CAD in native artery: Plavix remains on hold due to procedure.  Hopefully can resume on discharge will check with GI.   Consults: Gastroenterology next interventional  radiology  Subjective: Alert awake oriented resting comfortably.  Reports going for a CT scan today.  Vital signs normal.  Discharge Exam: Vitals:   11/12/20 2019 11/13/20 0523  BP: 112/68 115/70  Pulse: 70 62  Resp: 20 20  Temp: 98.6 F (37 C) 98.7 F (37.1 C)  SpO2: 93% 92%   General: Pt is alert, awake, not in acute distress Cardiovascular: RRR, S1/S2 +, no rubs, no gallops Respiratory: CTA bilaterally, no wheezing, no rhonchi Abdominal: Soft, NT, ND, bowel sounds + Extremities: no edema, no cyanosis  Discharge Instructions  Discharge Instructions     Discharge instructions   Complete by: As directed    Please call call MD or return to ER for similar or worsening recurring problem that brought you to hospital or if any fever,nausea/vomiting,abdominal pain, uncontrolled pain, chest pain,  shortness of breath or any other alarming symptoms.  Please follow-up with the radiology regarding your drain clear.  Follow-up with gastroenterology for biopsy results  Please follow-up your doctor as instructed in a week time and call the office for appointment.  Please avoid alcohol, smoking, or any other illicit substance and maintain healthy habits including taking your regular medications as prescribed.  You were cared for by a hospitalist during your hospital stay. If you have any questions about your discharge medications or the care you received while you were in the hospital after you are discharged, you can call the unit and ask to speak with the hospitalist on call if the hospitalist that took care of you is not available.  Once you are discharged, your primary care physician will handle any further medical issues. Please note that NO REFILLS for any discharge medications will be authorized once you are discharged, as it is imperative that you return to your primary care physician (or establish a relationship with a primary care physician if you do not have one) for your  aftercare needs so that they can reassess your need for medications and monitor your lab values   Discharge wound care:   Complete by: As directed    Keep it dry clean   Increase activity slowly   Complete by: As directed       Allergies as of 11/13/2020       Reactions   Norco [hydrocodone-acetaminophen] Itching        Medication List     TAKE these medications    ALPRAZolam 0.25 MG tablet Commonly known as: XANAX Take 1 tablet (0.25 mg total) by mouth at bedtime as needed for up to 5 doses for anxiety.   amLODipine 5 MG tablet Commonly known as: NORVASC Take 5 mg by mouth at bedtime.   atenolol 50 MG tablet Commonly known as: TENORMIN Take 50 mg by mouth at bedtime.   Basaglar KwikPen 100 UNIT/ML Inject 6 Units into the skin at bedtime.   clopidogrel 75 MG tablet Commonly known as: PLAVIX Take 1 tablet (75  mg total) by mouth at bedtime. Start taking on: November 14, 2020   dicyclomine 10 MG capsule Commonly known as: BENTYL Take 1 capsule (10 mg total) by mouth as needed for spasms. 10 mg po q 6 hours prn   NovoLOG FlexPen 100 UNIT/ML FlexPen Generic drug: insulin aspart Inject 10 Units into the skin 3 (three) times daily as needed for high blood sugar. If BS is 150-200=2 units, 201-250=4 units, 251-300=6 units, 301-350=8 units, 351-400=10 units.   Saline Flush 0.9 % Soln Inject 10 mLs into the vein daily. Flush drain with 5-10 mL 1-2 times daily.   sertraline 100 MG tablet Commonly known as: ZOLOFT Take 1 tablet (100 mg total) by mouth daily. Start taking on: November 14, 2020   traMADol 50 MG tablet Commonly known as: ULTRAM Take 50 mg by mouth every 6 (six) hours as needed for moderate pain or severe pain.               Discharge Care Instructions  (From admission, onward)           Start     Ordered   11/13/20 0000  Discharge wound care:       Comments: Keep it dry clean   11/13/20 1517            Follow-up Information      Kennieth Rad, MD Follow up in 1 week(s).   Specialties: Internal Medicine, Infectious Diseases Contact information: [facility] [place] [address] [phone]         Markus Daft, MD Follow up.   Specialties: Interventional Radiology, Radiology Why: Schedulers will contact you with date and time of appointment. Contact information: [address] [place] [address] [phone]                Allergies  Allergen Reactions   Norco [Hydrocodone-Acetaminophen] Itching    The results of significant diagnostics from this hospitalization (including imaging, microbiology, ancillary and laboratory) are listed below for reference.    Microbiology: Recent Results (from the past 240 hour(s))  SARS CORONAVIRUS 2 (TAT 6-24 HRS) Nasopharyngeal Nasopharyngeal Swab     Status: None   Collection Time: 11/09/20  5:15 PM   Specimen: Nasopharyngeal Swab  Result Value Ref Range Status   SARS Coronavirus 2 NEGATIVE NEGATIVE Final    Comment: (NOTE) SARS-CoV-2 target nucleic acids are NOT DETECTED.  The SARS-CoV-2 RNA is generally detectable in upper and lower respiratory specimens during the acute phase of infection. Negative results do not preclude SARS-CoV-2 infection, do not rule out co-infections with other pathogens, and should not be used as the sole basis for treatment or other patient management decisions. Negative results must be combined with clinical observations, patient history, and epidemiological information. The expected result is Negative.  Fact Sheet for Patients: SugarRoll.be  Fact Sheet for Healthcare Providers: https://www.woods-mathews.com/  This test is not yet approved or cleared by the [place] FDA and  has been authorized for detection and/or diagnosis of SARS-CoV-2 by FDA under an Emergency Use Authorization (EUA). This EUA will remain  in effect (meaning  this test can be used) for the duration of the COVID-19 declaration under Se ction 564(b)(1) of the Act, 21 U.S.C. section 360bbb-3(b)(1), unless the authorization is terminated or revoked sooner.  Performed at [facility], [place] [address], [place], [place] [address]   Aerobic/Anaerobic Culture w Gram Stain (surgical/deep wound)     Status: None (Preliminary result)  Collection Time: 11/12/20 11:45 AM   Specimen: BILE  Result Value Ref Range Status   Specimen Description   Final    BILE Performed at [place] [address] [place], [place] [address]    Special Requests   Final    NONE Performed at [facility] [place] [address] [place], [place] [address]    Gram Stain   Final    NO SQUAMOUS EPITHELIAL CELLS SEEN NO WBC SEEN NO ORGANISMS SEEN    Culture   Final    NO GROWTH < 12 HOURS Performed at [facility], [place] [address], [place], [place] [address]    Report Status PENDING  Incomplete    Procedures/Studies: MR Abdomen W Wo Contrast  Result Date: 11/06/2020 CLINICAL DATA:  Weight loss. Outside CT demonstrating a pancreatic abnormality. Epigastric discomfort. EXAM: MRI ABDOMEN WITHOUT AND WITH CONTRAST (INCLUDING MRCP) TECHNIQUE: Multiplanar multisequence MR imaging of the abdomen was performed both before and after the administration of intravenous contrast. Heavily T2-weighted images of the biliary and pancreatic ducts were obtained, and three-dimensional MRCP images were rendered by post processing. CONTRAST:  36m GADAVIST GADOBUTROL 1 MMOL/ML IV SOLN COMPARISON:  None. FINDINGS: Lower chest: Normal heart size without pericardial or pleural effusion. Hepatobiliary: No suspicious liver lesion. The gallbladder is mildly distended 10.3 cm with small gallstones within. No evidence of acute cholecystitis. Moderate intra and extrahepatic biliary duct dilatation. Common duct 1.7 cm on 16/8. Followed to the level of the pancreatic  head. Pancreas: Pancreatic body and tail atrophy with duct dilatation including up to 9 mm on 20/5. Both ducts undergo an abrupt transition in the region of the pancreatic head. A non border deforming pancreatic head mass is subtly apparent at 3.2 x 2.6 cm on 52/23. No superimposed acute pancreatitis. Spleen:  Normal in size, without focal abnormality. Adrenals/Urinary Tract: Normal adrenal glands. Tiny upper pole left renal lesion is likely a cyst. Normal right kidney. No hydronephrosis. Stomach/Bowel: Normal stomach, without wall thickening. Normal abdominal bowel loops. Vascular/Lymphatic: Aortic atherosclerosis. No arterial involvement by tumor. The SMV is adjacent to presumed tumor without encasement, including on 51/23. Portal vein uninvolved. No abdominal adenopathy. A left periaortic 7 mm node on 29/5 is not pathologic by size criteria. Other: No ascites. Subtle left-sided pericolonic nodule of 5 mm on 36/23. Musculoskeletal: No acute osseous abnormality. IMPRESSION: 1. 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 2. No evidence of hepatic metastasis or vascular encasement. The SMV likely contacts tumor over a significantly less than 180 degree span. 3. Minimal left-sided pericolonic nodularity is felt unlikely to represent peritoneal isolated metastasis. Possibly the sequelae of colonic diverticulosis or even a reactive node. Recommend attention on follow-up. 4. Cholelithiasis. Electronically Signed   By: KAbigail MiyamotoM.D.   On: 11/06/2020 13:49   MR 3D Recon At Scanner  Result Date: 11/06/2020 CLINICAL DATA:  Weight loss. Outside CT demonstrating a pancreatic abnormality. Epigastric discomfort. EXAM: MRI ABDOMEN WITHOUT AND WITH CONTRAST (INCLUDING MRCP) TECHNIQUE: Multiplanar multisequence MR imaging of the abdomen was performed both before and after the administration of  intravenous contrast. Heavily T2-weighted images of the biliary and pancreatic ducts were obtained, and three-dimensional MRCP images were rendered by post processing. CONTRAST:  726mGADAVIST GADOBUTROL 1 MMOL/ML IV SOLN COMPARISON:  None. FINDINGS: Lower chest: Normal heart size without pericardial or pleural effusion. Hepatobiliary: No suspicious liver  lesion. The gallbladder is mildly distended 10.3 cm with small gallstones within. No evidence of acute cholecystitis. Moderate intra and extrahepatic biliary duct dilatation. Common duct 1.7 cm on 16/8. Followed to the level of the pancreatic head. Pancreas: Pancreatic body and tail atrophy with duct dilatation including up to 9 mm on 20/5. Both ducts undergo an abrupt transition in the region of the pancreatic head. A non border deforming pancreatic head mass is subtly apparent at 3.2 x 2.6 cm on 52/23. No superimposed acute pancreatitis. Spleen:  Normal in size, without focal abnormality. Adrenals/Urinary Tract: Normal adrenal glands. Tiny upper pole left renal lesion is likely a cyst. Normal right kidney. No hydronephrosis. Stomach/Bowel: Normal stomach, without wall thickening. Normal abdominal bowel loops. Vascular/Lymphatic: Aortic atherosclerosis. No arterial involvement by tumor. The SMV is adjacent to presumed tumor without encasement, including on 51/23. Portal vein uninvolved. No abdominal adenopathy. A left periaortic 7 mm node on 29/5 is not pathologic by size criteria. Other: No ascites. Subtle left-sided pericolonic nodule of 5 mm on 36/23. Musculoskeletal: No acute osseous abnormality. IMPRESSION: 1. 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 2. No evidence of hepatic metastasis or vascular encasement. The SMV likely contacts tumor over a significantly less than 180 degree span. 3. Minimal left-sided pericolonic nodularity  is felt unlikely to represent peritoneal isolated metastasis. Possibly the sequelae of colonic diverticulosis or even a reactive node. Recommend attention on follow-up. 4. Cholelithiasis. Electronically Signed   By: Abigail Miyamoto M.D.   On: 11/06/2020 13:49   DG ERCP BILIARY & PANCREATIC DUCTS  Result Date: 11/11/2020 CLINICAL DATA:  ERCP. EXAM: ERCP TECHNIQUE: Multiple spot images obtained with the fluoroscopic device and submitted for interpretation post-procedure. FLUOROSCOPY TIME:  Fluoroscopy Time:  6 minutes and 2 seconds. Radiation Exposure Index (if provided by the fluoroscopic device): None provided Number of Acquired Spot Images: 6 COMPARISON:  MR abdomen, 11/06/2020. FINDINGS: Multiple, limited oblique planar images of the RIGHT upper quadrant demonstrating endoscopy and common bile duct cannulation. IMPRESSION: Fluoroscopic imaging for ERCP. Please see performing service dictation for complete description of intraprocedural findings. Electronically Signed   By: Michaelle Birks M.D.   On: 11/11/2020 16:19   IR PERCUTANEOUS TRANSHEPATIC CHOLANGIOGRAM  Result Date: 11/12/2020 INDICATION: 70 year old with obstructive jaundice and a pancreatic head mass. Patient needs biliary decompression. EXAM: 1. Percutaneous transhepatic cholangiogram 2. Placement of internal/external biliary drain using ultrasound and fluoroscopic guidance MEDICATIONS: Cefoxitin 2 g; The antibiotic was administered within an appropriate time frame prior to the initiation of the procedure. ANESTHESIA/SEDATION: Moderate (conscious) sedation was employed during this procedure. A total of Versed 4.0 mg and Fentanyl 100 mcg was administered intravenously. Moderate Sedation Time: 20 minutes. The patient's level of consciousness and vital signs were monitored continuously by radiology nursing throughout the procedure under my direct supervision. FLUOROSCOPY TIME:  Fluoroscopy Time: 3 minutes, 40 mGy COMPLICATIONS: None immediate. PROCEDURE:  Informed written consent was obtained from the patient after a thorough discussion of the procedural risks, benefits and alternatives. All questions were addressed.A timeout was performed prior to the initiation of the procedure. The anterior and right side of the abdomen was prepped and draped in sterile fashion. Maximal barrier sterile technique was utilized including caps, mask, sterile gowns, sterile gloves, sterile drape, hand hygiene and skin antiseptic. Ultrasound demonstrated severe intrahepatic biliary dilatation. A dilated peripheral bile duct in the right hepatic lobe was targeted. The skin  was anesthetized using 1% lidocaine. Using ultrasound guidance, 21 gauge needle was directed into a dilated bile duct. Bile was draining from the needle. Contrast injection confirmed placement in the biliary system. A 0.018 wire was easily advanced into the biliary system and a transitional dilator set was placed. Bentson wire was placed. Kumpe catheter was advanced over the wire. A Kumpe catheter was advanced into the common bile duct and manipulated into the duodenum using the Bentson wire. The Kumpe catheter was removed and the tract was dilated over the wire to accommodate a 10 [place] biliary drain. Biliary drain was advanced over the wire and into the duodenum. Pigtail was coiled within the duodenum. Large volume of bile was aspirated. Samples sent for culture and cytology. Contrast injection was performed. Catheter was attached to a gravity bag. Catheter was sutured to skin and a sterile dressing was placed. FINDINGS: Severe intrahepatic biliary dilatation. A peripheral right hepatic bile duct was successfully cannulated. Cholangiogram demonstrated an obstruction in the distal common bile duct. Catheter and wire were advanced beyond the obstruction and the drain tip was positioned in the duodenum. IMPRESSION: Percutaneous transhepatic cholangiogram demonstrating a distal common bile duct obstruction. Successful  placement of an internal/external biliary drain. Drain tip is in the duodenum. Electronically Signed   By: Markus Daft M.D.   On: 11/12/2020 14:14    Labs: BNP (last 3 results) No results for input(s): BNP in the last 8760 hours. Basic Metabolic Panel: Recent Labs  Lab 11/09/20 1600 11/10/20 0445 11/11/20 0753 11/12/20 0743 11/12/20 2344 11/13/20 0742  NA 134* 137 133* 135 137 137  K 3.9 3.4* 3.7 3.8 3.3* 3.8  CL 101 103 101 101 102 101  CO2 '23 25 23 24 26 25  '$ GLUCOSE 285* 148* 177* 199* 122* 117*  BUN '20 20 16 21 17 18  '$ CREATININE 1.04 0.99 0.80 0.94 1.10 1.08  CALCIUM 9.8 9.2 9.2 8.9 8.9 8.9  MG 2.0  --   --   --   --   --    Liver Function Tests: Recent Labs  Lab 11/10/20 0445 11/11/20 0753 11/12/20 0743 11/12/20 2344 11/13/20 0742  AST 61* 58* 62* 62* 56*  ALT 63* 58* 52* 51* 52*  ALKPHOS 318* 311* 311* 281* 283*  BILITOT 14.5* 15.6* 16.5* 13.3* 12.8*  PROT 6.3* 5.3* 5.8* 5.6* 5.6*  ALBUMIN 3.0* 2.6* 2.7* 2.6* 2.6*   Recent Labs  Lab 11/11/20 0753  LIPASE 18   No results for input(s): AMMONIA in the last 168 hours. CBC: Recent Labs  Lab 11/09/20 1600 11/10/20 0445 11/11/20 1701 11/11/20 2345 11/12/20 0743 11/12/20 1548 11/12/20 2344 11/13/20 0742  WBC 8.1 8.2  --   --  9.0  --   --  7.4  NEUTROABS 4.7 4.5  --   --   --   --   --   --   HGB 12.8* 11.1*   < > 10.6* 10.1* 10.1* 9.7* 10.1*  10.1*  HCT 36.4* 31.9*   < > 30.1* 28.7* 28.3* 27.4* 29.6*  29.2*  MCV 86.1 86.4  --   --  85.2  --   --  87.8  PLT 292 262  --   --  258  --   --  242   < > = values in this interval not displayed.   Cardiac Enzymes: No results for input(s): CKTOTAL, CKMB, CKMBINDEX, TROPONINI in the last 168 hours. BNP: Invalid input(s): POCBNP CBG: Recent Labs  Lab 11/12/20 1213 11/12/20  [place] 11/12/20 2021 11/13/20 0744 11/13/20 1237  GLUCAP 145* 171* 215* 111* 124*   D-Dimer No results for input(s): DDIMER in the last 72 hours. Hgb A1c No results for input(s):  HGBA1C in the last 72 hours. Lipid Profile No results for input(s): CHOL, HDL, LDLCALC, TRIG, CHOLHDL, LDLDIRECT in the last 72 hours. Thyroid function studies No results for input(s): TSH, T4TOTAL, T3FREE, THYROIDAB in the last 72 hours.  Invalid input(s): FREET3 Anemia work up No results for input(s): VITAMINB12, FOLATE, FERRITIN, TIBC, IRON, RETICCTPCT in the last 72 hours. Urinalysis No results found for: COLORURINE, APPEARANCEUR, [place], [place], [place], [place], [place], [place], PROTEINUR, UROBILINOGEN, NITRITE, LEUKOCYTESUR Sepsis Labs Invalid input(s): PROCALCITONIN,  WBC,  LACTICIDVEN Microbiology Recent Results (from the past 240 hour(s))  SARS CORONAVIRUS 2 (TAT 6-24 HRS) Nasopharyngeal Nasopharyngeal Swab     Status: None   Collection Time: 11/09/20  5:15 PM   Specimen: Nasopharyngeal Swab  Result Value Ref Range Status   SARS Coronavirus 2 NEGATIVE NEGATIVE Final    Comment: (NOTE) SARS-CoV-2 target nucleic acids are NOT DETECTED.  The SARS-CoV-2 RNA is generally detectable in upper and lower respiratory specimens during the acute phase of infection. Negative results do not preclude SARS-CoV-2 infection, do not rule out co-infections with other pathogens, and should not be used as the sole basis for treatment or other patient management decisions. Negative results must be combined with clinical observations, patient history, and epidemiological information. The expected result is Negative.  Fact Sheet for Patients: SugarRoll.be  Fact Sheet for Healthcare Providers: https://www.woods-mathews.com/  This test is not yet approved or cleared by the [place] FDA and  has been authorized for detection and/or diagnosis of SARS-CoV-2 by FDA under an Emergency Use Authorization (EUA). This EUA will remain  in effect (meaning this test can be used) for the duration of the COVID-19 declaration under Se ction 564(b)(1) of  the Act, 21 U.S.C. section 360bbb-3(b)(1), unless the authorization is terminated or revoked sooner.  Performed at [facility], [place] [address], [place], [place] [address]   Aerobic/Anaerobic Culture w Gram Stain (surgical/deep wound)     Status: None (Preliminary result)   Collection Time: 11/12/20 11:45 AM   Specimen: BILE  Result Value Ref Range Status   Specimen Description   Final    BILE Performed at [place] [address], [place], [place] [address]    Special Requests   Final    NONE Performed at [facility], [place] [address] [place], [place] [address]    Gram Stain   Final    NO SQUAMOUS EPITHELIAL CELLS SEEN NO WBC SEEN NO ORGANISMS SEEN    Culture   Final    NO GROWTH < 12 HOURS Performed at [facility], [place] [address] [place], [place] [address]    Report Status PENDING  Incomplete     Time coordinating discharge: 35 minutes  SIGNED: Antonieta Pert, MD  [facility] 11/13/2020, 3:18 PM  If 7PM-7AM, please contact night-coverage [email or web address]

## 2020-11-13 NOTE — Progress Notes (Signed)
[facility] Progress Note  CC:  Obstructive jaundice and pancreatic head mass  Subjective:  Feels fine.  Wants to go home and says that he was told that he could likely go later today.  Is going for his chest CT soon.  ERCP 8/23-unable to cannulate the bile duct EUS-localized severe mucosal changes in the duodenum and altered texture, 26 mm x 30 mm irregular mass in the pancreatic head, sonographic evidence suggesting invasion into the portal vein and SMV, atrophic pancreatic parenchyma and upstream pancreatic ductal dilation.  Fine-needle biopsy done..  No malignant appearing lymph nodes   Cytology pending CA 19-9 = 1641  IR drain placed on 8/24 and it looks to be draining well.  Objective:  Vital signs in last 24 hours: Temp:  [98 F (36.7 C)-98.7 F (37.1 C)] 98.7 F (37.1 C) (08/25 0523) Pulse Rate:  [62-78] 62 (08/25 0523) Resp:  [9-20] 20 (08/25 0523) BP: (96-156)/(61-93) 115/70 (08/25 0523) SpO2:  [92 %-99 %] 92 % (08/25 0523) Weight:  [73.6 kg] 73.6 kg (08/25 0205) Last BM Date: 11/12/20 General:  Alert, Well-developed, in NAD; jaundice noted. Heart:  Regular rate and rhythm; no murmurs Pulm:  CTAB.  No W/R/R. Abdomen:  Soft, non-distended.  BS present.  Non-tender.  Drainage bag noted with about 100cc of dark bile. Extremities:  Without edema. Neurologic:  Alert and oriented x4;  grossly normal neurologically. Psych:  Alert and cooperative. Normal mood and affect.  Intake/Output from previous day: 08/24 0701 - 08/25 0700 In: 1449.2 [I.V.:1449.2] Out: 250 [Drains:250]  Lab Results: Recent Labs    11/12/20 0743 11/12/20 1548 11/12/20 2344 11/13/20 0742  WBC 9.0  --   --   --   HGB 10.1* 10.1* 9.7* 10.1*  HCT 28.7* 28.3* 27.4* 29.2*  PLT 258  --   --   --    BMET Recent Labs    11/11/20 0753 11/12/20 0743 11/12/20 2344  NA 133* 135 137  K 3.7 3.8 3.3*  CL 101 101 102  CO2 _0 GLUCOSE 177* 199* 122*  BUN _1 CREATININE 0.80 0.94 1.10  CALCIUM 9.2 8.9 8.9   LFT Recent Labs    11/12/20 2344  PROT 5.6*  ALBUMIN 2.6*  AST 62*  ALT 51*  ALKPHOS 281*  BILITOT 13.3*   PT/INR Recent Labs    11/10/20 1019  LABPROT 12.1  INR 0.9    DG ERCP BILIARY & PANCREATIC DUCTS  Result Date: 11/11/2020 CLINICAL DATA:  ERCP. EXAM: ERCP TECHNIQUE: Multiple spot images obtained with the fluoroscopic device and submitted for interpretation post-procedure. FLUOROSCOPY TIME:  Fluoroscopy Time:  6 minutes and 2 seconds. Radiation Exposure Index (if provided by the fluoroscopic device): None provided Number of Acquired Spot Images: 6 COMPARISON:  MR abdomen, 11/06/2020. FINDINGS: Multiple, limited oblique planar images of the RIGHT upper quadrant demonstrating endoscopy and common bile duct cannulation. IMPRESSION: Fluoroscopic imaging for ERCP. Please see performing service dictation for complete description of intraprocedural findings. Electronically Signed   By: Michaelle Birks M.D.   On: 11/11/2020 16:19   IR PERCUTANEOUS TRANSHEPATIC CHOLANGIOGRAM  Result Date: 11/12/2020 INDICATION: 70 year old with obstructive jaundice and a pancreatic head mass. Patient needs biliary decompression. EXAM: 1. Percutaneous transhepatic cholangiogram 2. Placement of internal/external biliary drain using ultrasound and fluoroscopic guidance MEDICATIONS: Cefoxitin 2 g; The antibiotic was administered within an appropriate time frame prior to the initiation of the procedure. ANESTHESIA/SEDATION: Moderate (conscious) sedation was employed during  this procedure. A total of Versed 4.0 mg and Fentanyl 100 mcg was administered intravenously. Moderate Sedation Time: 20 minutes. The patient's level of consciousness and vital signs were monitored continuously by radiology nursing throughout the procedure under my direct supervision. FLUOROSCOPY TIME:  Fluoroscopy Time: 3 minutes, 40 mGy COMPLICATIONS: None immediate. PROCEDURE: Informed written  consent was obtained from the patient after a thorough discussion of the procedural risks, benefits and alternatives. All questions were addressed.A timeout was performed prior to the initiation of the procedure. The anterior and right side of the abdomen was prepped and draped in sterile fashion. Maximal barrier sterile technique was utilized including caps, mask, sterile gowns, sterile gloves, sterile drape, hand hygiene and skin antiseptic. Ultrasound demonstrated severe intrahepatic biliary dilatation. A dilated peripheral bile duct in the right hepatic lobe was targeted. The skin was anesthetized using 1% lidocaine. Using ultrasound guidance, 21 gauge needle was directed into a dilated bile duct. Bile was draining from the needle. Contrast injection confirmed placement in the biliary system. A 0.018 wire was easily advanced into the biliary system and a transitional dilator set was placed. Bentson wire was placed. Kumpe catheter was advanced over the wire. A Kumpe catheter was advanced into the common bile duct and manipulated into the duodenum using the Bentson wire. The Kumpe catheter was removed and the tract was dilated over the wire to accommodate a 10 [place] biliary drain. Biliary drain was advanced over the wire and into the duodenum. Pigtail was coiled within the duodenum. Large volume of bile was aspirated. Samples sent for culture and cytology. Contrast injection was performed. Catheter was attached to a gravity bag. Catheter was sutured to skin and a sterile dressing was placed. FINDINGS: Severe intrahepatic biliary dilatation. A peripheral right hepatic bile duct was successfully cannulated. Cholangiogram demonstrated an obstruction in the distal common bile duct. Catheter and wire were advanced beyond the obstruction and the drain tip was positioned in the duodenum. IMPRESSION: Percutaneous transhepatic cholangiogram demonstrating a distal common bile duct obstruction. Successful placement of an  internal/external biliary drain. Drain tip is in the duodenum. Electronically Signed   By: Markus Daft M.D.   On: 11/12/2020 14:14    Assessment / Plan: #16 70 year old white male admitted with obstructive jaundice, in setting of recent weight loss, and new onset diabetes with abnormal imaging of the pancreas on MRI.   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 Path/cytology is pending.  CA19-9 is 1641. S/p PTC with placement of internal/external biliary drain on 8/24 by IR.  Seems to be draining well.   #2 new onset diabetes mellitus-on insulin #3 history of coronary artery disease-on Plavix as an outpatient-on hold   CT chest has already been ordered for today. Will check CBC and CMP today.  Will take some time for LFTs to normalize so they will need to be followed as an outpatient then as well. If IR agrees then he is ok for discharge from a GI standpoint later today with further plans as an outpatient.   LOS: 2 days   Francisco Frank. Shean Gerding  11/13/2020, 9:40 AM

## 2020-11-13 NOTE — Progress Notes (Addendum)
Referring Physician(s): Dr. Rush Landmark  Supervising Physician: Arne Cleveland  Patient Status:  [facility] - In-pt  Chief Complaint: Obstructive jaundice  Subjective: Patient resting comfortably in bed.  Remains jaundiced.  Says his itching is back.  Hopeful for d/c home today.   Allergies: Norco [hydrocodone-acetaminophen]  Medications: Prior to Admission medications   Medication Sig Start Date End Date Taking? Authorizing Provider  amLODipine (NORVASC) 5 MG tablet Take 5 mg by mouth at bedtime. 10/22/20  Yes [provider]  atenolol (TENORMIN) 50 MG tablet Take 50 mg by mouth at bedtime.   Yes [provider]  clopidogrel (PLAVIX) 75 MG tablet Take 75 mg by mouth at bedtime.   Yes [provider]  dicyclomine (BENTYL) 10 MG capsule Take 1 capsule (10 mg total) by mouth as needed for spasms. 10 mg po q 6 hours prn 08/06/20  Yes Cirigliano, Vito V, DO  Insulin Glargine (BASAGLAR KWIKPEN) 100 UNIT/ML Inject 6 Units into the skin at bedtime. 09/27/20  Yes [provider]  NOVOLOG FLEXPEN 100 UNIT/ML FlexPen Inject 10 Units into the skin 3 (three) times daily as needed for high blood sugar. If BS is 150-200=2 units, 201-250=4 units, 251-300=6 units, 301-350=8 units, 351-400=10 units. 09/18/20  Yes [provider]  Sodium Chloride Flush (SALINE FLUSH) 0.9 % SOLN Inject 10 mLs into the vein daily. Flush drain with 5-10 mL 1-2 times daily. 11/13/20  Yes Docia Barrier, PA  traMADol (ULTRAM) 50 MG tablet Take 50 mg by mouth every 6 (six) hours as needed for moderate pain or severe pain. 06/11/20  Yes [provider]     Vital Signs: BP 115/70 (BP Location: Right Arm)   Pulse 62   Temp 98.7 F (37.1 C) (Oral)   Resp 20   Ht '5\' 8"'$  (1.727 m)   Wt 162 lb 4.1 oz (73.6 kg)   SpO2 92%   BMI 24.67 kg/m   Physical Exam NAD, alert Abdomen: soft, non-tender.  RUQ I/E biliary drain in place.  Dark, yellow bilious-appearing output in  bag. Flushes easily.   Imaging: DG ERCP BILIARY & PANCREATIC DUCTS  Result Date: 11/11/2020 CLINICAL DATA:  ERCP. EXAM: ERCP TECHNIQUE: Multiple spot images obtained with the fluoroscopic device and submitted for interpretation post-procedure. FLUOROSCOPY TIME:  Fluoroscopy Time:  6 minutes and 2 seconds. Radiation Exposure Index (if provided by the fluoroscopic device): None provided Number of Acquired Spot Images: 6 COMPARISON:  MR abdomen, 11/06/2020. FINDINGS: Multiple, limited oblique planar images of the RIGHT upper quadrant demonstrating endoscopy and common bile duct cannulation. IMPRESSION: Fluoroscopic imaging for ERCP. Please see performing service dictation for complete description of intraprocedural findings. Electronically Signed   By: Michaelle Birks M.D.   On: 11/11/2020 16:19   IR PERCUTANEOUS TRANSHEPATIC CHOLANGIOGRAM  Result Date: 11/12/2020 INDICATION: 70 year old with obstructive jaundice and a pancreatic head mass. Patient needs biliary decompression. EXAM: 1. Percutaneous transhepatic cholangiogram 2. Placement of internal/external biliary drain using ultrasound and fluoroscopic guidance MEDICATIONS: Cefoxitin 2 g; The antibiotic was administered within an appropriate time frame prior to the initiation of the procedure. ANESTHESIA/SEDATION: Moderate (conscious) sedation was employed during this procedure. A total of Versed 4.0 mg and Fentanyl 100 mcg was administered intravenously. Moderate Sedation Time: 20 minutes. The patient's level of consciousness and vital signs were monitored continuously by radiology nursing throughout the procedure under my direct supervision. FLUOROSCOPY TIME:  Fluoroscopy Time: 3 minutes, 40 mGy COMPLICATIONS: None immediate. PROCEDURE: Informed written consent was obtained from the patient  after a thorough discussion of the procedural risks, benefits and alternatives. All questions were addressed.A timeout was performed prior to the initiation of the  procedure. The anterior and right side of the abdomen was prepped and draped in sterile fashion. Maximal barrier sterile technique was utilized including caps, mask, sterile gowns, sterile gloves, sterile drape, hand hygiene and skin antiseptic. Ultrasound demonstrated severe intrahepatic biliary dilatation. A dilated peripheral bile duct in the right hepatic lobe was targeted. The skin was anesthetized using 1% lidocaine. Using ultrasound guidance, 21 gauge needle was directed into a dilated bile duct. Bile was draining from the needle. Contrast injection confirmed placement in the biliary system. A 0.018 wire was easily advanced into the biliary system and a transitional dilator set was placed. Bentson wire was placed. Kumpe catheter was advanced over the wire. A Kumpe catheter was advanced into the common bile duct and manipulated into the duodenum using the Bentson wire. The Kumpe catheter was removed and the tract was dilated over the wire to accommodate a 10 [place] biliary drain. Biliary drain was advanced over the wire and into the duodenum. Pigtail was coiled within the duodenum. Large volume of bile was aspirated. Samples sent for culture and cytology. Contrast injection was performed. Catheter was attached to a gravity bag. Catheter was sutured to skin and a sterile dressing was placed. FINDINGS: Severe intrahepatic biliary dilatation. A peripheral right hepatic bile duct was successfully cannulated. Cholangiogram demonstrated an obstruction in the distal common bile duct. Catheter and wire were advanced beyond the obstruction and the drain tip was positioned in the duodenum. IMPRESSION: Percutaneous transhepatic cholangiogram demonstrating a distal common bile duct obstruction. Successful placement of an internal/external biliary drain. Drain tip is in the duodenum. Electronically Signed   By: Markus Daft M.D.   On: 11/12/2020 14:14    Labs:  CBC: Recent Labs    11/09/20 1600 11/10/20 0445  11/11/20 1701 11/12/20 0743 11/12/20 1548 11/12/20 2344 11/13/20 0742  WBC 8.1 8.2  --  9.0  --   --  7.4  HGB 12.8* 11.1*   < > 10.1* 10.1* 9.7* 10.1*  10.1*  HCT 36.4* 31.9*   < > 28.7* 28.3* 27.4* 29.6*  29.2*  PLT 292 262  --  258  --   --  242   < > = values in this interval not displayed.    COAGS: Recent Labs    11/10/20 1019  INR 0.9    BMP: Recent Labs    11/11/20 0753 11/12/20 0743 11/12/20 2344 11/13/20 0742  NA 133* 135 137 137  K 3.7 3.8 3.3* 3.8  CL 101 101 102 101  CO2 '23 24 26 25  '$ GLUCOSE 177* 199* 122* 117*  BUN '16 21 17 18  '$ CALCIUM 9.2 8.9 8.9 8.9  CREATININE 0.80 0.94 1.10 1.08  GFRNONAA >60 >60 >60 >60    LIVER FUNCTION TESTS: Recent Labs    11/11/20 0753 11/12/20 0743 11/12/20 2344 11/13/20 0742  BILITOT 15.6* 16.5* 13.3* 12.8*  AST 58* 62* 62* 56*  ALT 58* 52* 51* 52*  ALKPHOS 311* 311* 281* 283*  PROT 5.3* 5.8* 5.6* 5.6*  ALBUMIN 2.6* 2.7* 2.6* 2.6*    Assessment and Plan: Obstructive jaundice s/p external/internal biliary drain placement 8/24 by Dr. Anselm Pancoast Patient comfortable today.  No new nausea/vomiting.  Itching is back.  Reports poor appetite.  Tbili improved to 12.8 Path/cyto pending.  Requesting d/c home today.  All teams in agreement.  Discussed with Dr. Vernard Gambles who  recommends return to IR in 1 week for stent attempt.  Patient is from [place] but is agreeable to future interventions in [place] as needed.  Wife at bedside.  All questions answered.    Electronically Signed: Docia Barrier, PA 11/13/2020, 2:47 PM   I spent a total of 15 Minutes at the the patient's bedside AND on the patient's hospital floor or unit, greater than 50% of which was counseling/coordinating care for obstructive jaundice

## 2020-11-14 LAB — CYTOLOGY - NON PAP

## 2020-11-14 NOTE — Telephone Encounter (Signed)
Dr. Bryan Lemma, patient came in on 8/18 for labs. Looks like he had a CT scan yesterday. EUA/ERCP was completed on 11/11/20. Amy is out of the office today. Please advise, thanks.

## 2020-11-14 NOTE — Telephone Encounter (Signed)
CT chest and CT pelvis reviewed.  CT chest without any evidence of intrathoracic metastasis.  CT pelvis shows thickening in the cecum, but otherwise no lymphadenopathy or suspicion of metastasis.  However, that area of the cecum was just visualized a couple months ago on colonoscopy and appeared normal.  I went back and reviewed those colonoscopy images for verification, and procedure completed without difficulty and complete visualization of the cecal cap achieved and normal-appearing.  No repeat colonoscopy necessary.  For now, no further imaging needed.  Keep follow-up appointment in Oncology clinic as scheduled.

## 2020-11-14 NOTE — Telephone Encounter (Signed)
Patients wife called states the patient was just discharged from the ER and they are looking for CT and lab results also looks like the ERCP was canceled.

## 2020-11-17 ENCOUNTER — Encounter: Payer: Self-pay | Admitting: *Deleted

## 2020-11-17 LAB — AEROBIC/ANAEROBIC CULTURE W GRAM STAIN (SURGICAL/DEEP WOUND)
Culture: NO GROWTH
Gram Stain: NONE SEEN

## 2020-11-17 NOTE — Telephone Encounter (Signed)
Called Francisco Frank. No answer. Left a message on her voicemail.

## 2020-11-17 NOTE — Telephone Encounter (Signed)
Esterwood, Amy S, PA-C  Pt has been diagnosed with pancreatic cancer - was in the hospital - fecal elastase had been ordered when I saw him as outpt- this is abnormal - low - so he does have pancreatic insufficiency - if still having oily stool - can start him on Creon 12K start with one before each meal , can increase to 2  ac  if needed.  If he prefers to hold off at present that is fine aslo

## 2020-11-18 ENCOUNTER — Encounter: Payer: Self-pay | Admitting: *Deleted

## 2020-11-18 ENCOUNTER — Telehealth: Payer: Self-pay | Admitting: Nurse Practitioner

## 2020-11-18 ENCOUNTER — Inpatient Hospital Stay: Payer: Medicare Other

## 2020-11-18 ENCOUNTER — Telehealth: Payer: Self-pay

## 2020-11-18 ENCOUNTER — Encounter: Payer: Self-pay | Admitting: Nurse Practitioner

## 2020-11-18 ENCOUNTER — Inpatient Hospital Stay: Payer: Medicare Other | Attending: Nurse Practitioner | Admitting: Nurse Practitioner

## 2020-11-18 ENCOUNTER — Other Ambulatory Visit: Payer: Self-pay | Admitting: Student

## 2020-11-18 ENCOUNTER — Other Ambulatory Visit: Payer: Self-pay

## 2020-11-18 VITALS — BP 114/73 | HR 57 | Temp 98.1°F | Resp 18 | Ht 68.0 in | Wt 151.0 lb

## 2020-11-18 DIAGNOSIS — C259 Malignant neoplasm of pancreas, unspecified: Secondary | ICD-10-CM

## 2020-11-18 DIAGNOSIS — Z79899 Other long term (current) drug therapy: Secondary | ICD-10-CM | POA: Diagnosis not present

## 2020-11-18 DIAGNOSIS — C250 Malignant neoplasm of head of pancreas: Secondary | ICD-10-CM | POA: Diagnosis not present

## 2020-11-18 DIAGNOSIS — K7689 Other specified diseases of liver: Secondary | ICD-10-CM

## 2020-11-18 DIAGNOSIS — E119 Type 2 diabetes mellitus without complications: Secondary | ICD-10-CM | POA: Insufficient documentation

## 2020-11-18 DIAGNOSIS — I10 Essential (primary) hypertension: Secondary | ICD-10-CM | POA: Diagnosis not present

## 2020-11-18 DIAGNOSIS — K8689 Other specified diseases of pancreas: Secondary | ICD-10-CM

## 2020-11-18 LAB — CMP (CANCER CENTER ONLY)
ALT: 45 U/L — ABNORMAL HIGH (ref 0–44)
AST: 38 U/L (ref 15–41)
Albumin: 3.6 g/dL (ref 3.5–5.0)
Alkaline Phosphatase: 222 U/L — ABNORMAL HIGH (ref 38–126)
Anion gap: 11 (ref 5–15)
BUN: 26 mg/dL — ABNORMAL HIGH (ref 8–23)
CO2: 24 mmol/L (ref 22–32)
Calcium: 9.5 mg/dL (ref 8.9–10.3)
Chloride: 94 mmol/L — ABNORMAL LOW (ref 98–111)
Creatinine: 1.21 mg/dL (ref 0.61–1.24)
GFR, Estimated: >60 mL/min (ref >60)
Glucose, Bld: 294 mg/dL — ABNORMAL HIGH (ref 70–99)
Potassium: 3.9 mmol/L (ref 3.5–5.1)
Sodium: 129 mmol/L — ABNORMAL LOW (ref 135–145)
Total Bilirubin: 14.4 mg/dL (ref 0.3–1.2)
Total Protein: 7.1 g/dL (ref 6.5–8.1)

## 2020-11-18 NOTE — Patient Instructions (Signed)
Biliary Drainage Catheter Home Guide A biliary drainage catheter is a thin, flexible tube that is inserted through your skin into the bile ducts in your liver. Bile is a thick yellow or green fluid that helps digest fat in foods. The purpose of a biliary drainagecatheter is to keep bile from backing up into your liver. There are three kinds of biliary drainage catheter systems. Follow general guidelines and instructions for your specific drainage system. Your health care provider will explain how to: Inspect your drainage catheter. Flush your drainage catheter. Attach and empty your collection bag. Change your bandage (dressing). What are the risks? The biliary drainage catheter system is generally safe. However, problems may occur, including: Infection. Bleeding. A dislodged or blocked catheter. Supplies needed: Supplies you will need to change your dressing: Mild soap and warm water. A clean cloth. Split gauze pads, 4 x 4 inches (10 x 10 cm) to use as a dressing sponge. Gauze pads, 4 x 4 inches (10 x 10 cm), or adhesive dressing cover. Paper tape. Supplies you will need to flush your drainage catheter: Alcohol swab. 10 mL prefilled normal salt-water (saline) syringe. Supplies you will need to empty your collection bag: Drainage container. Tissue or disposable napkin. Measuring container, if needed. How to care for your drainage catheter How to inspect your drainage catheter Check your dressing to make sure that it is dry and clean. Change your dressing if it comes loose or gets wet or dirty. Check your collection bag to make sure that drainage fluid is flowing into the bag well. Note the color and amount compared with the color and amount on other days. Check your drainage catheter and bag for any cracks or kinks in the tubing. How to flush your drainage catheter Biliary drainagecatheters should be flushed daily, or as often as told by your health care provider. The end of your  drainage catheter is closed using an IV cap. You can connect the syringe directly to the IV cap. Wash your hands with soap and water for at least 20 seconds. If your drainage catheter has an external valve (stopcock) attached to it, turn the stopcock toward the collection bag. This will allow the saline to flow in the direction of your body. Clean the IV cap with an alcohol swab. Screw the tip of a 10 mL normal saline syringe onto the IV cap. Inject the saline for 5-10 seconds. If you feel resistance while injecting, stop right away. Do not pull back on the plunger. Pulling back on the plunger could increase your risk of infection. Remove the syringe from the cap. Turn the stopcock so that fluid flows from your body into your collection bag. You may notice more fluid flowing into the bag after you have completed the flush. How to attach a bag to your drainage catheter If you are having trouble with your internal biliary drain, your health care provider may direct you to use bag drainage until you can be seen to fix the problem. For this reason, you should always have a collection bag and connecting tubing at home. If you do not have these supplies, remember to ask for them at your next appointment. Remove the bag and the connecting tubing from their packaging. Connect the funnel end of the tubing to the bag's cone-shaped stem. Remove the IV cap from the biliary drain. To do this, unscrew the cap and replace it with the screw-on end of the tubing. Save the IV cap in a plastic storage bag that  can be sealed. How to empty your collection bag Empty the collection bag whenever it becomes ? full. Also empty it before you go to sleep. Most collection bags have a drainage valve at the bottom that allows them to be emptied easily. Wash your hands with soap and water for at least 20 seconds. Hold the collection bag over the toilet, basin, or collection container. Use a measuring container if your health care  provider told you to measure the drainage. Unscrew the valve to open it, and allow the bag to drain. Close the valve securely to avoid leakage. Use a tissue or disposable napkin to wipe the valve clean. Measure the amount of drainage and then dispose of the fluid in the toilet. Wash the measuring container with soap and water. Record the amount of drainage as told. How to change your dressing The dressing over your drainage catheter may be changed weekly, or more often if needed to keep the dressing dry and clean. Your health care provider will tell you how often to change your dressing. Wash your hands with soap and water for at least 20 seconds. Gently remove your old dressing. Avoid using scissors to remove the dressing because they may damage the drainage catheter. Wash the skin around your insertion site with mild soap and warm water. Then, rinse well and pat the area dry with a clean cloth. Check the skin around your drainage catheter for redness or swelling, or for yellow or green fluid that has a bad smell. Also check for a rash, warmth, or skin breakdown. If your drainage catheter was stitched (sutured) to your skin, inspect the suture to make sure it is still anchored in your skin. Do not apply creams, ointments, or alcohol to the site. Allow your skin to air-dry completely before you apply a new dressing. Place your drainage catheter through the slit in a dressing sponge. The dressing sponge should slide under the disk that holds the drainage catheter in place. Cover the drainage catheter and the dressing sponge. Cover the catheter and sponge with a 4 x 4 inch (10 x 10 cm) gauze. The drainage catheter should rest on the gauze and not on your skin. Then tape the dressing to your skin. If told, use an adhesive dressing covering over the top of the dressing in place of the gauze and tape. Wash your hands with soap and water for at least 20 seconds. Follow these instructions at home Keep  all follow-up visits as told by your health care provider. This isimportant. Contact a health care provider if: Your pain gets worse after it had improved, and it is not relieved with pain medicines. You have any questions about caring for your drainage catheter or collection bag. The skin around your catheter insertion site breaks down. You have any of these signs of infection around your catheter insertion site: More redness, swelling, or pain. Fluid or blood. Warmth. Pus or a bad smell. Get help right away if: You have a fever or chills. You have bile leaking around your drainage catheter. Your drainage catheter becomes blocked or clogged. Your drainage catheter comes out. Summary Bile is a thick yellow or green fluid that helps digest fat in foods. The purpose of a biliary drainage catheter is to keep bile from backing up into your liver. Check the skin around your drainage catheter for redness or swelling, or for yellow or green fluid that has a bad smell. Also check for a rash or skin breakdown. Biliary drainagecatheters  should be flushed daily, or as often as told by your health care provider. Empty the collection bag whenever it becomes ? full. Also empty it before you go to sleep. This information is not intended to replace advice given to you by your health care provider. Make sure you discuss any questions you have with your healthcare provider. Document Revised: 05/01/2019 Document Reviewed: 12/27/2018 Elsevier Patient Education  [place].

## 2020-11-18 NOTE — Telephone Encounter (Signed)
Called made pt aware provider suggested use of Benadryl for sleep pt expression skepticism but states he will try the nurse advises to give it 3 day trial and call with update will follow up with pt in several days to get update.

## 2020-11-18 NOTE — H&P (Signed)
Chief Complaint: Patient was seen in consultation today for cholangiogram via existing biliary drain with possible exchange and upsizing at the request of Ned Card NP  Referring Physician(s): Ned Card NP  Supervising Physician: Jacqulynn Cadet  Patient Status: [facility] - Out-pt  History of Present Illness: Francisco Frank is a 70 y.o. 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 Anselm Pancoast. Patient was discharged home on 11/13/2020 with outpatient referral to hematology and oncology and follow-up with IR in 1 week for possible biliary stent placement with IR on Friday 11/21/2020.  Patient was seen at the cancer center yesterday and CMP revealed increased T bili of 14.4, T bili was 13.3 at the time of the biliary drain placement.  IR was contacted by the cancer center, the case was reviewed by Dr. Pascal Lux who recommended patient to come back to either [facility] or [facility] today for cholangiogram with possible exchange and upsize of the bili drain with sedation.  Patient presents to [facility] IR for the procedure.  Patient laying in bed, not in acute distress. Patient states that he never had any problems with the bili drain, never has any abdominal pain or nausea/vomiting. Only compliant he has is that he cannot lie on his sides which is preventing him from getting good sleep.  Denise headache, fever, chills, shortness of breath, cough, chest pain, abdominal pain, nausea ,vomiting, and bleeding.   Past Medical History:  Diagnosis Date   Back pain    Diabetes mellitus without complication ([place])    Hearing deficit    Hyperlipemia    Hypertension    Insomnia    Sleep apnea     Past Surgical History:  Procedure Laterality Date   BIOPSY  11/11/2020   Procedure: BIOPSY;  Surgeon:  Rush Landmark Telford Nab., MD;  Location: [facility];  Service: Gastroenterology;;   CARDIAC CATHETERIZATION     ENDOSCOPIC RETROGRADE CHOLANGIOPANCREATOGRAPHY (ERCP) WITH PROPOFOL N/A 11/11/2020   Procedure: ENDOSCOPIC RETROGRADE CHOLANGIOPANCREATOGRAPHY (ERCP) WITH PROPOFOL;  Surgeon: Irving Copas., MD;  Location: [facility];  Service: Gastroenterology;  Laterality: N/A;   ESOPHAGOGASTRODUODENOSCOPY (EGD) WITH PROPOFOL N/A 11/11/2020   Procedure: ESOPHAGOGASTRODUODENOSCOPY (EGD) WITH PROPOFOL;  Surgeon: Rush Landmark Telford Nab., MD;  Location: [facility];  Service: Gastroenterology;  Laterality: N/A;   EUS N/A 11/11/2020   Procedure: UPPER ENDOSCOPIC ULTRASOUND (EUS) RADIAL;  Surgeon: Irving Copas., MD;  Location: [facility];  Service: Gastroenterology;  Laterality: N/A;   FINE NEEDLE ASPIRATION  11/11/2020   Procedure: FINE NEEDLE ASPIRATION;  Surgeon: Rush Landmark Telford Nab., MD;  Location: [facility];  Service: Gastroenterology;;   HEMOSTASIS CONTROL  11/11/2020   Procedure: HEMOSTASIS CONTROL;  Surgeon: Irving Copas., MD;  Location: [facility];  Service: Gastroenterology;;  epi injection   IR PERCUTANEOUS TRANSHEPATIC CHOLANGIOGRAM  11/12/2020   REPLACEMENT TOTAL KNEE     ROTATOR CUFF REPAIR     SPHINCTEROTOMY  11/11/2020   Procedure: SPHINCTEROTOMY;  Surgeon: Mansouraty, Telford Nab., MD;  Location: [facility];  Service: Gastroenterology;;    Allergies: Norco [hydrocodone-acetaminophen]  Medications: Prior to Admission medications   Medication Sig Start Date End Date Taking? Authorizing Provider  ALPRAZolam (XANAX) 0.25 MG tablet Take 1 tablet (0.25 mg total) by mouth at bedtime as needed for up to 5 doses for anxiety. 11/13/20   Antonieta Pert, MD  amLODipine (  NORVASC) 5 MG tablet Take 5 mg by mouth at bedtime. 10/22/20   [provider]  atenolol (TENORMIN) 50 MG tablet Take 50 mg by mouth at bedtime.    [provider]  atorvastatin  (LIPITOR) 40 MG tablet Take 40 mg by mouth daily. 11/16/20   [provider]  BD PEN NEEDLE NANO 2ND GEN 32G X 4 MM MISC  11/17/20   [provider]  clopidogrel (PLAVIX) 75 MG tablet Take 1 tablet (75 mg total) by mouth at bedtime. Patient not taking: Reported on 11/18/2020 11/14/20   Antonieta Pert, MD  dicyclomine (BENTYL) 10 MG capsule Take 1 capsule (10 mg total) by mouth as needed for spasms. 10 mg po q 6 hours prn Patient not taking: Reported on 11/18/2020 08/06/20   Cirigliano, Vito V, DO  Insulin Glargine (BASAGLAR KWIKPEN) 100 UNIT/ML Inject 6 Units into the skin at bedtime. 09/27/20   [provider]  NOVOLOG FLEXPEN 100 UNIT/ML FlexPen Inject 10 Units into the skin 3 (three) times daily as needed for high blood sugar. If BS is 150-200=2 units, 201-250=4 units, 251-300=6 units, 301-350=8 units, 351-400=10 units. 09/18/20   [provider]  sertraline (ZOLOFT) 100 MG tablet Take 1 tablet (100 mg total) by mouth daily. 11/14/20 12/14/20  Antonieta Pert, MD  Sodium Chloride Flush (SALINE FLUSH) 0.9 % SOLN Inject 10 mLs into the vein daily. Flush drain with 5-10 mL 1-2 times daily. 11/13/20   Docia Barrier, PA  traMADol (ULTRAM) 50 MG tablet Take 50 mg by mouth every 6 (six) hours as needed for moderate pain or severe pain. 06/11/20   [provider]     Family History  Problem Relation Age of Onset   Cirrhosis Father    Alcohol abuse Father    Colon cancer Maternal Grandmother    Colonic polyp Maternal Grandmother    Heart disease Paternal Grandfather    Esophageal cancer Neg Hx    Pancreatic cancer Neg Hx    Stomach cancer Neg Hx     Social History   Socioeconomic History   Marital status: Married    Spouse name: Not on file   Number of children: Not on file   Years of education: Not on file   Highest education level: Not on file  Occupational History   Not on file  Tobacco Use   Smoking status: Never   Smokeless tobacco: Current     Types: Snuff    Last attempt to quit: 2018  Vaping Use   Vaping Use: Never used  Substance and Sexual Activity   Alcohol use: Yes    Comment: rare   Drug use: Never   Sexual activity: Not Currently  Other Topics Concern   Not on file  Social History Narrative   Not on file   Social Determinants of Health   Financial Resource Strain: Not on file  Food Insecurity: Not on file  Transportation Needs: Not on file  Physical Activity: Not on file  Stress: Not on file  Social Connections: Not on file     Review of Systems: A 12 point ROS discussed and pertinent positives are indicated in the HPI above.  All other systems are negative.   Vital Signs: BP 125/79   Pulse (!) 55   Temp 97.8 F (36.6 C) (Oral)   Resp 17   SpO2 98%   Physical Exam Vitals and nursing note reviewed.  Constitutional:      General: He is not in  acute distress. HENT:     Head: Normocephalic and atraumatic.     Mouth/Throat:     Mouth: Mucous membranes are moist.  Eyes:     General: Scleral icterus present.  Cardiovascular:     Rate and Rhythm: Regular rhythm. Bradycardia present.     Pulses: Normal pulses.     Heart sounds: Normal heart sounds.  Pulmonary:     Effort: Pulmonary effort is normal.     Breath sounds: Normal breath sounds.  Abdominal:     General: Abdomen is flat. Bowel sounds are normal.     Palpations: Abdomen is soft.  Musculoskeletal:     Cervical back: Neck supple.  Skin:    General: Skin is warm and dry.     Coloration: Skin is jaundiced.     Comments: Positive RUQ drain to a gravity bag. Site is unremarkable with no erythema, edema, tenderness, bleeding or drainage. Suture and stat lock in place. Dressing is clean, dry, and intact. 150 ml of  bile colored fluid noted in the bag.   Neurological:     Mental Status: He is alert and oriented to person, place, and time.  Psychiatric:        Mood and Affect: Mood normal.        Behavior: Behavior normal.        Judgment:  Judgment normal.    MD Evaluation Airway: WNL Heart: WNL Abdomen: WNL Chest/ Lungs: WNL ASA  Classification: 3 Mallampati/Airway Score: One  Imaging: CT CHEST W CONTRAST  Result Date: 11/13/2020 CLINICAL DATA:  Pancreatic cancer EXAM: CT CHEST WITH CONTRAST TECHNIQUE: Multidetector CT imaging of the chest was performed during intravenous contrast administration. CONTRAST:  27m OMNIPAQUE IOHEXOL 350 MG/ML SOLN COMPARISON:  11/06/2020 FINDINGS: Cardiovascular: The heart and great vessels are unremarkable without pericardial effusion. No evidence of thoracic aortic aneurysm or dissection. Mild atherosclerosis of the aorta and coronary vasculature. No evidence of pulmonary embolus. Mediastinum/Nodes: No enlarged mediastinal, hilar, or axillary lymph nodes. Thyroid gland, trachea, and esophagus demonstrate no significant findings. Lungs/Pleura: Scattered areas of scarring are seen within the upper lobes. Minimal hypoventilatory change at the left lung base. No acute airspace disease, effusion, or pneumothorax. No pulmonary nodules or masses. The central airways are widely patent. Upper Abdomen: Percutaneous trans hepatic biliary drain is identified. There is mild gallbladder wall thickening, with intraluminal high attenuation possibly representing contrast introduced during biliary procedure. Pancreatic duct dilation again noted. The pancreatic head mass seen on previous MRI is not included due to slice selection. Musculoskeletal: No acute or destructive bony lesions. Reconstructed images demonstrate no additional findings. IMPRESSION: 1. No acute intrathoracic metastases. 2. Percutaneous trans hepatic biliary drain. 3. Nonspecific gallbladder wall thickening. 4. Pancreatic duct dilation. The pancreatic head mass seen on previous MRI is not included due to slice selection. 5.  Aortic Atherosclerosis (ICD10-I70.0). Electronically Signed   By: MRanda NgoM.D.   On: 11/13/2020 15:17   CT PELVIS W  CONTRAST  Result Date: 11/13/2020 CLINICAL DATA:  Pancreatic cancer, staging EXAM: CT PELVIS WITH CONTRAST TECHNIQUE: Multidetector CT imaging of the pelvis was performed using the standard protocol following the bolus administration of intravenous contrast. CONTRAST:  864mOMNIPAQUE IOHEXOL 350 MG/ML SOLN COMPARISON:  11/06/2020 FINDINGS: Urinary Tract: Distal ureters and bladder are unremarkable. No urinary tract calculi. Bowel: There is circumferential mural thickening of the cecum, measuring up to 12 mm, nonspecific. No other areas of bowel wall thickening or inflammatory change. No bowel obstruction or ileus. Normal caliber  appendix right lower quadrant. Vascular/Lymphatic: Atherosclerosis the identified within the distal aorta and its branches. No pathologic adenopathy visualized within the pelvis. Reproductive:  Prostate is unremarkable. Other:  No free fluid or free gas.  No abdominal wall hernia. Musculoskeletal: No acute or destructive bony lesions. There is severe spondylosis partially visualized at L3-4. Reconstructed images demonstrate no additional findings. IMPRESSION: 1. Nonspecific circumferential wall thickening of the cecum, measuring up to 12 mm in thickness. Correlation with colonoscopy may be useful to exclude underlying neoplasm. No surrounding inflammatory changes. 2. Otherwise no evidence of intrapelvic metastases. 3.  Aortic Atherosclerosis (ICD10-I70.0). Electronically Signed   By: Randa Ngo M.D.   On: 11/13/2020 15:20   MR Abdomen W Wo Contrast  Result Date: 11/06/2020 CLINICAL DATA:  Weight loss. Outside CT demonstrating a pancreatic abnormality. Epigastric discomfort. EXAM: MRI ABDOMEN WITHOUT AND WITH CONTRAST (INCLUDING MRCP) TECHNIQUE: Multiplanar multisequence MR imaging of the abdomen was performed both before and after the administration of intravenous contrast. Heavily T2-weighted images of the biliary and pancreatic ducts were obtained, and three-dimensional MRCP  images were rendered by post processing. CONTRAST:  47m GADAVIST GADOBUTROL 1 MMOL/ML IV SOLN COMPARISON:  None. FINDINGS: Lower chest: Normal heart size without pericardial or pleural effusion. Hepatobiliary: No suspicious liver lesion. The gallbladder is mildly distended 10.3 cm with small gallstones within. No evidence of acute cholecystitis. Moderate intra and extrahepatic biliary duct dilatation. Common duct 1.7 cm on 16/8. Followed to the level of the pancreatic head. Pancreas: Pancreatic body and tail atrophy with duct dilatation including up to 9 mm on 20/5. Both ducts undergo an abrupt transition in the region of the pancreatic head. A non border deforming pancreatic head mass is subtly apparent at 3.2 x 2.6 cm on 52/23. No superimposed acute pancreatitis. Spleen:  Normal in size, without focal abnormality. Adrenals/Urinary Tract: Normal adrenal glands. Tiny upper pole left renal lesion is likely a cyst. Normal right kidney. No hydronephrosis. Stomach/Bowel: Normal stomach, without wall thickening. Normal abdominal bowel loops. Vascular/Lymphatic: Aortic atherosclerosis. No arterial involvement by tumor. The SMV is adjacent to presumed tumor without encasement, including on 51/23. Portal vein uninvolved. No abdominal adenopathy. A left periaortic 7 mm node on 29/5 is not pathologic by size criteria. Other: No ascites. Subtle left-sided pericolonic nodule of 5 mm on 36/23. Musculoskeletal: No acute osseous abnormality. IMPRESSION: 1. 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 2. No evidence of hepatic metastasis or vascular encasement. The SMV likely contacts tumor over a significantly less than 180 degree span. 3. Minimal left-sided pericolonic nodularity is felt unlikely to represent peritoneal isolated metastasis. Possibly the sequelae of colonic diverticulosis or even a reactive  node. Recommend attention on follow-up. 4. Cholelithiasis. Electronically Signed   By: KAbigail MiyamotoM.D.   On: 11/06/2020 13:49   MR 3D Recon At Scanner  Result Date: 11/06/2020 CLINICAL DATA:  Weight loss. Outside CT demonstrating a pancreatic abnormality. Epigastric discomfort. EXAM: MRI ABDOMEN WITHOUT AND WITH CONTRAST (INCLUDING MRCP) TECHNIQUE: Multiplanar multisequence MR imaging of the abdomen was performed both before and after the administration of intravenous contrast. Heavily T2-weighted images of the biliary and pancreatic ducts were obtained, and three-dimensional MRCP images were rendered by post processing. CONTRAST:  766mGADAVIST GADOBUTROL 1 MMOL/ML IV SOLN COMPARISON:  None. FINDINGS: Lower chest: Normal heart size without pericardial or pleural effusion. Hepatobiliary: No suspicious liver lesion. The gallbladder is mildly distended 10.3  cm with small gallstones within. No evidence of acute cholecystitis. Moderate intra and extrahepatic biliary duct dilatation. Common duct 1.7 cm on 16/8. Followed to the level of the pancreatic head. Pancreas: Pancreatic body and tail atrophy with duct dilatation including up to 9 mm on 20/5. Both ducts undergo an abrupt transition in the region of the pancreatic head. A non border deforming pancreatic head mass is subtly apparent at 3.2 x 2.6 cm on 52/23. No superimposed acute pancreatitis. Spleen:  Normal in size, without focal abnormality. Adrenals/Urinary Tract: Normal adrenal glands. Tiny upper pole left renal lesion is likely a cyst. Normal right kidney. No hydronephrosis. Stomach/Bowel: Normal stomach, without wall thickening. Normal abdominal bowel loops. Vascular/Lymphatic: Aortic atherosclerosis. No arterial involvement by tumor. The SMV is adjacent to presumed tumor without encasement, including on 51/23. Portal vein uninvolved. No abdominal adenopathy. A left periaortic 7 mm node on 29/5 is not pathologic by size criteria. Other: No ascites. Subtle  left-sided pericolonic nodule of 5 mm on 36/23. Musculoskeletal: No acute osseous abnormality. IMPRESSION: 1. 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 2. No evidence of hepatic metastasis or vascular encasement. The SMV likely contacts tumor over a significantly less than 180 degree span. 3. Minimal left-sided pericolonic nodularity is felt unlikely to represent peritoneal isolated metastasis. Possibly the sequelae of colonic diverticulosis or even a reactive node. Recommend attention on follow-up. 4. Cholelithiasis. Electronically Signed   By: Abigail Miyamoto M.D.   On: 11/06/2020 13:49   DG ERCP BILIARY & PANCREATIC DUCTS  Result Date: 11/11/2020 CLINICAL DATA:  ERCP. EXAM: ERCP TECHNIQUE: Multiple spot images obtained with the fluoroscopic device and submitted for interpretation post-procedure. FLUOROSCOPY TIME:  Fluoroscopy Time:  6 minutes and 2 seconds. Radiation Exposure Index (if provided by the fluoroscopic device): None provided Number of Acquired Spot Images: 6 COMPARISON:  MR abdomen, 11/06/2020. FINDINGS: Multiple, limited oblique planar images of the RIGHT upper quadrant demonstrating endoscopy and common bile duct cannulation. IMPRESSION: Fluoroscopic imaging for ERCP. Please see performing service dictation for complete description of intraprocedural findings. Electronically Signed   By: Michaelle Birks M.D.   On: 11/11/2020 16:19   IR PERCUTANEOUS TRANSHEPATIC CHOLANGIOGRAM  Result Date: 11/12/2020 INDICATION: 70 year old with obstructive jaundice and a pancreatic head mass. Patient needs biliary decompression. EXAM: 1. Percutaneous transhepatic cholangiogram 2. Placement of internal/external biliary drain using ultrasound and fluoroscopic guidance MEDICATIONS: Cefoxitin 2 g; The antibiotic was administered within an appropriate time frame prior to the initiation of the  procedure. ANESTHESIA/SEDATION: Moderate (conscious) sedation was employed during this procedure. A total of Versed 4.0 mg and Fentanyl 100 mcg was administered intravenously. Moderate Sedation Time: 20 minutes. The patient's level of consciousness and vital signs were monitored continuously by radiology nursing throughout the procedure under my direct supervision. FLUOROSCOPY TIME:  Fluoroscopy Time: 3 minutes, 40 mGy COMPLICATIONS: None immediate. PROCEDURE: Informed written consent was obtained from the patient after a thorough discussion of the procedural risks, benefits and alternatives. All questions were addressed.A timeout was performed prior to the initiation of the procedure. The anterior and right side of the abdomen was prepped and draped in sterile fashion. Maximal barrier sterile technique was utilized including caps, mask, sterile gowns, sterile gloves, sterile drape, hand hygiene and skin antiseptic. Ultrasound demonstrated severe intrahepatic biliary dilatation. A dilated peripheral bile duct in the right hepatic lobe was targeted. The skin was anesthetized using 1% lidocaine. Using ultrasound  guidance, 21 gauge needle was directed into a dilated bile duct. Bile was draining from the needle. Contrast injection confirmed placement in the biliary system. A 0.018 wire was easily advanced into the biliary system and a transitional dilator set was placed. Bentson wire was placed. Kumpe catheter was advanced over the wire. A Kumpe catheter was advanced into the common bile duct and manipulated into the duodenum using the Bentson wire. The Kumpe catheter was removed and the tract was dilated over the wire to accommodate a 10 [place] biliary drain. Biliary drain was advanced over the wire and into the duodenum. Pigtail was coiled within the duodenum. Large volume of bile was aspirated. Samples sent for culture and cytology. Contrast injection was performed. Catheter was attached to a gravity bag. Catheter  was sutured to skin and a sterile dressing was placed. FINDINGS: Severe intrahepatic biliary dilatation. A peripheral right hepatic bile duct was successfully cannulated. Cholangiogram demonstrated an obstruction in the distal common bile duct. Catheter and wire were advanced beyond the obstruction and the drain tip was positioned in the duodenum. IMPRESSION: Percutaneous transhepatic cholangiogram demonstrating a distal common bile duct obstruction. Successful placement of an internal/external biliary drain. Drain tip is in the duodenum. Electronically Signed   By: Markus Daft M.D.   On: 11/12/2020 14:14    Labs:  CBC: Recent Labs    11/09/20 1600 11/10/20 0445 11/11/20 1701 11/12/20 0743 11/12/20 1548 11/12/20 2344 11/13/20 0742  WBC 8.1 8.2  --  9.0  --   --  7.4  HGB 12.8* 11.1*   < > 10.1* 10.1* 9.7* 10.1*  10.1*  HCT 36.4* 31.9*   < > 28.7* 28.3* 27.4* 29.6*  29.2*  PLT 292 262  --  258  --   --  242   < > = values in this interval not displayed.    COAGS: Recent Labs    11/10/20 1019 11/19/20 0850  INR 0.9 0.9    BMP: Recent Labs    11/12/20 0743 11/12/20 2344 11/13/20 0742 11/18/20 1056  NA 135 137 137 129*  K 3.8 3.3* 3.8 3.9  CL 101 102 101 94*  CO2 '24 26 25 24  '$ GLUCOSE 199* 122* 117* 294*  BUN '21 17 18 '$ 26*  CALCIUM 8.9 8.9 8.9 9.5  CREATININE 0.94 1.10 1.08 1.21  GFRNONAA >60 >60 >60 >60    LIVER FUNCTION TESTS: Recent Labs    11/12/20 0743 11/12/20 2344 11/13/20 0742 11/18/20 1056  BILITOT 16.5* 13.3* 12.8* 14.4*  AST 62* 62* 56* 38  ALT 52* 51* 52* 45*  ALKPHOS 311* 281* 283* 222*  PROT 5.8* 5.6* 5.6* 7.1  ALBUMIN 2.7* 2.6* 2.6* 3.6    TUMOR MARKERS: No results for input(s): AFPTM, CEA, CA199, CHROMGRNA in the last 8760 hours.  Assessment and Plan: 70 y.o. male with recent diagnosis of obstructing pancreatic cancer, known to IR service for percutaneous internal/external biliary drain placement with Dr. Anselm Pancoast on 11/12/2020.  Patient was  scheduled to return to IR on 11/21/2020 for possible exchange of the ext/int bili drain to an internal biliary stent per Dr. Vernard Gambles; however, IR was contacted by cancer center on 11/18/2020 with concern of increased T bili despite the biliary drain in place.  Case was reviewed by Dr. Pascal Lux who recommend patient to return to either [facility] IR or [facility] IR today for a cholangiogram and possible exchange/upsizing of the existing biliary drain with sedation.  Patient presents to [facility] IR today for the procedure. N.p.o. since  midnight VSS CBC pending CMP yesterday notable for elevated T bili 14.4 (T bili was 13.3 on 8/24 when the ext/int bili drain was placed, which went down to 12.8 after drain placement.)  INR 0.9 On Plavix 75 daily per chart, pt states that he has not been taking it for a week.   Risks and benefits discussed with the patient including bleeding, infection, damage to adjacent structures, bowel perforation/fistula connection, and sepsis.  All of the patient's questions were answered, patient is agreeable to proceed. Consent signed and in chart.   Thank you for this interesting consult.  I greatly enjoyed meeting Francisco Frank and look forward to participating in their care.  A copy of this report was sent to the requesting provider on this date.  Electronically Signed: Tera Mater, PA-C 11/19/2020, 9:14 AM   I spent a total of    25 Minutes in face to face in clinical consultation, greater than 50% of which was counseling/coordinating care for cholangiogram and exchange/upsizing of the existing biliary drain with sedation.

## 2020-11-18 NOTE — Progress Notes (Signed)
Met with Francisco Frank, daughter Francisco Frank and wife Francisco Frank after initial visit with Ned Card NP and Dr Benay Spice, introduced myself as his Navigator and reviewed upcoming referrals and appts. Dr Benay Spice will see him on 9/9 for treatment plan discussion. Patient and family given written information regarding Pancreatic Cancer and possible treatments.During office visit Biliary drain was flushed with 10cc Normal Saline, flushed easily and dressing changed. Patient has appt with IR on Friday for internalization of biliary drain. Patient and family appreciated information and this nurse will plan to f/u with him at next visit.

## 2020-11-18 NOTE — Progress Notes (Signed)
New Hematology/Oncology Consult   Requesting MD: Dr. Rush Landmark  [phone]    Reason for Consult: Pancreas cancer  HPI: Francisco Frank is a 70 year old man referred with a new diagnosis of pancreas cancer.  He reports crampy abdominal pain and change in bowel habits in February of this year.  Colonoscopy 08/06/2020 showed an 8 mm polyp in the sigmoid colon, a few small and large mouth diverticula in the sigmoid colon, normal mucosa found in the entire colon with biopsies for histology taken with a cold forceps from the right colon and left colon for evaluation of microscopic colitis (right colon-colonic mucosa with no significant pathologic findings; left colon-colonic mucosa with no significant pathologic findings; sigmoid polyp-tubular adenoma, negative for high-grade dysplasia).  He reports being diagnosed with diabetes in June, now on insulin.  He had a CT scan at an outside facility which apparently showed a pancreatic abnormality.  He reports developing painless jaundice and pruritus about 3 weeks ago.  He underwent an MRI of the abdomen 11/06/2020.  The MRI showed biliary and pancreatic duct dilatation to the level of ductal obstruction in the pancreatic head suspicious for a nonborder deforming adenocarcinoma.  There was no evidence of hepatic metastasis or vascular encasement.  SMV likely contacts tumor over a significantly less than 180 degree span.  A left para-aortic 7 mm node noted.  Subtle left-sided pericolonic nodule of 5 mm.    He was admitted 11/09/2020 and underwent EUS/ERCP 11/11/2020.  EGD showed gastritis which was biopsied, mucosal changes in the duodenum, mucosal changes in the duodenum sweep (gastric antral and oxyntic mucosa with no specific histopathologic changes; Warthin Starry stain negative for H pylori).  EUS showed an irregular mass in the pancreatic head measuring 26 mm x 30 mm; the outer margins were irregular.  There was sonographic evidence suggesting invasion into the  portal vein (manifested by abutment) and the superior mesenteric vein (manifested by abutment); an intact interface was seen between the mass and the superior mesenteric artery and celiac trunk suggesting a lack of invasion.  Endosonographic imaging in the visualized portion of the liver showed no mass.  There was dilatation of the common bile duct and the common hepatic duct.  No malignant appearing lymph nodes were visualized in the celiac region, peripancreatic region, porta hepatis region.  FNA biopsy of the pancreas head mass showed malignant cells consistent with adenocarcinoma.  Biliary access could not be obtained.  Cholangiogram 11/12/2020 showed severe intrahepatic biliary dilatation.  A peripheral right hepatic bile duct was successfully cannulated.  Internal/external biliary drain placed.  FNA of pancreas head showed malignant cells consistent with adenocarcinoma.  Cytology on biliary drain bile showed malignant cells consistent with adenocarcinoma.  CT chest 11/13/2020 showed no evidence of metastatic disease.  CT pelvis 11/13/2020 showed nonspecific circumferential wall thickening of the cecum measuring up to 12 mm in thickness.  Otherwise no evidence of intrapelvic metastases.     Past Medical History:  Diagnosis Date   Back pain    Diabetes mellitus without complication (HCC)    Hearing deficit    Hyperlipemia    Hypertension    Insomnia    Sleep apnea   :   Past Surgical History:  Procedure Laterality Date   BIOPSY  11/11/2020   Procedure: BIOPSY;  Surgeon: Rush Landmark Telford Nab., MD;  Location: [facility];  Service: Gastroenterology;;   CARDIAC CATHETERIZATION     ENDOSCOPIC RETROGRADE CHOLANGIOPANCREATOGRAPHY (ERCP) WITH PROPOFOL N/A 11/11/2020   Procedure: ENDOSCOPIC RETROGRADE CHOLANGIOPANCREATOGRAPHY (ERCP) WITH PROPOFOL;  Surgeon: Irving Copas., MD;  Location: [facility];  Service: Gastroenterology;  Laterality: N/A;   ESOPHAGOGASTRODUODENOSCOPY (EGD) WITH  PROPOFOL N/A 11/11/2020   Procedure: ESOPHAGOGASTRODUODENOSCOPY (EGD) WITH PROPOFOL;  Surgeon: Rush Landmark Telford Nab., MD;  Location: [facility];  Service: Gastroenterology;  Laterality: N/A;   EUS N/A 11/11/2020   Procedure: UPPER ENDOSCOPIC ULTRASOUND (EUS) RADIAL;  Surgeon: Irving Copas., MD;  Location: [facility];  Service: Gastroenterology;  Laterality: N/A;   FINE NEEDLE ASPIRATION  11/11/2020   Procedure: FINE NEEDLE ASPIRATION;  Surgeon: Rush Landmark Telford Nab., MD;  Location: [facility];  Service: Gastroenterology;;   HEMOSTASIS CONTROL  11/11/2020   Procedure: HEMOSTASIS CONTROL;  Surgeon: Irving Copas., MD;  Location: [facility];  Service: Gastroenterology;;  epi injection   IR PERCUTANEOUS TRANSHEPATIC CHOLANGIOGRAM  11/12/2020   REPLACEMENT TOTAL KNEE     ROTATOR CUFF REPAIR     SPHINCTEROTOMY  11/11/2020   Procedure: SPHINCTEROTOMY;  Surgeon: Irving Copas., MD;  Location: [facility];  Service: Gastroenterology;;  :   Current Outpatient Medications:    ALPRAZolam (XANAX) 0.25 MG tablet, Take 1 tablet (0.25 mg total) by mouth at bedtime as needed for up to 5 doses for anxiety., Disp: 5 tablet, Rfl: 0   amLODipine (NORVASC) 5 MG tablet, Take 5 mg by mouth at bedtime., Disp: , Rfl:    atenolol (TENORMIN) 50 MG tablet, Take 50 mg by mouth at bedtime., Disp: , Rfl:    Insulin Glargine (BASAGLAR KWIKPEN) 100 UNIT/ML, Inject 6 Units into the skin at bedtime., Disp: , Rfl:    NOVOLOG FLEXPEN 100 UNIT/ML FlexPen, Inject 10 Units into the skin 3 (three) times daily as needed for high blood sugar. If BS is 150-200=2 units, 201-250=4 units, 251-300=6 units, 301-350=8 units, 351-400=10 units., Disp: , Rfl:    sertraline (ZOLOFT) 100 MG tablet, Take 1 tablet (100 mg total) by mouth daily., Disp: 30 tablet, Rfl: 0   Sodium Chloride Flush (SALINE FLUSH) 0.9 % SOLN, Inject 10 mLs into the vein daily. Flush drain with 5-10 mL 1-2 times daily., Disp: 150 mL, Rfl:  0   traMADol (ULTRAM) 50 MG tablet, Take 50 mg by mouth every 6 (six) hours as needed for moderate pain or severe pain., Disp: , Rfl:    atorvastatin (LIPITOR) 40 MG tablet, Take 40 mg by mouth daily., Disp: , Rfl:    BD PEN NEEDLE NANO 2ND GEN 32G X 4 MM MISC, , Disp: , Rfl:    clopidogrel (PLAVIX) 75 MG tablet, Take 1 tablet (75 mg total) by mouth at bedtime. (Patient not taking: Reported on 11/18/2020), Disp: , Rfl:    dicyclomine (BENTYL) 10 MG capsule, Take 1 capsule (10 mg total) by mouth as needed for spasms. 10 mg po q 6 hours prn (Patient not taking: Reported on 11/18/2020), Disp: 90 capsule, Rfl: 1:  :   Allergies  Allergen Reactions   Norco [Hydrocodone-Acetaminophen] Itching    FH: Maternal grandmother with question colon cancer; mother with dementia, question of cancer; daughter with thyroid cancer  SOCIAL HISTORY: He lives in [place].  He is married.  He has 2 children.  He is retired from [employer], he was a Designer, fashion/clothing.  No tobacco use.  He quit drinking 15 years ago.  He reports heavy drinking in the past.  He has never had a blood transfusion.  Review of Systems: He continues to note jaundice.  Main complaint is fatigue.  He is having difficulty sleeping.  He has tried  melatonin and Xanax.  No fevers or sweats.  Appetite overall is good but he has had to change his diet significantly due to the diabetes diagnosis.  He has lost about 50 pounds since March of this year.  No nausea or vomiting.  Stool has been more solid for the past 2 days.  He has pain related to the drain.  No shortness of breath or cough.  No chest pain.  No neuropathy symptoms.  Physical Exam:  Blood pressure 114/73, pulse (!) 57, temperature 98.1 F (36.7 C), temperature source Oral, resp. rate 18, height '5\' 8"'$  (1.727 m), weight 151 lb (68.5 kg), SpO2 98 %.  HEENT: Scleral icterus.  No thrush or ulcers. Lungs: Lungs clear bilaterally. Cardiac: Regular rate and rhythm. Abdomen: Abdomen  soft and nontender.  No hepatomegaly.  No mass. Vascular: No leg edema. Lymph nodes: No palpable cervical, supraclavicular or axillary lymph nodes. Neurologic: Alert and oriented. Skin: Jaundice.  LABS:  Chemistry panel pending  RADIOLOGY:  CT CHEST W CONTRAST  Result Date: 11/13/2020 CLINICAL DATA:  Pancreatic cancer EXAM: CT CHEST WITH CONTRAST TECHNIQUE: Multidetector CT imaging of the chest was performed during intravenous contrast administration. CONTRAST:  22m OMNIPAQUE IOHEXOL 350 MG/ML SOLN COMPARISON:  11/06/2020 FINDINGS: Cardiovascular: The heart and great vessels are unremarkable without pericardial effusion. No evidence of thoracic aortic aneurysm or dissection. Mild atherosclerosis of the aorta and coronary vasculature. No evidence of pulmonary embolus. Mediastinum/Nodes: No enlarged mediastinal, hilar, or axillary lymph nodes. Thyroid gland, trachea, and esophagus demonstrate no significant findings. Lungs/Pleura: Scattered areas of scarring are seen within the upper lobes. Minimal hypoventilatory change at the left lung base. No acute airspace disease, effusion, or pneumothorax. No pulmonary nodules or masses. The central airways are widely patent. Upper Abdomen: Percutaneous trans hepatic biliary drain is identified. There is mild gallbladder wall thickening, with intraluminal high attenuation possibly representing contrast introduced during biliary procedure. Pancreatic duct dilation again noted. The pancreatic head mass seen on previous MRI is not included due to slice selection. Musculoskeletal: No acute or destructive bony lesions. Reconstructed images demonstrate no additional findings. IMPRESSION: 1. No acute intrathoracic metastases. 2. Percutaneous trans hepatic biliary drain. 3. Nonspecific gallbladder wall thickening. 4. Pancreatic duct dilation. The pancreatic head mass seen on previous MRI is not included due to slice selection. 5.  Aortic Atherosclerosis (ICD10-I70.0).  Electronically Signed   By: MRanda NgoM.D.   On: 11/13/2020 15:17   CT PELVIS W CONTRAST  Result Date: 11/13/2020 CLINICAL DATA:  Pancreatic cancer, staging EXAM: CT PELVIS WITH CONTRAST TECHNIQUE: Multidetector CT imaging of the pelvis was performed using the standard protocol following the bolus administration of intravenous contrast. CONTRAST:  835mOMNIPAQUE IOHEXOL 350 MG/ML SOLN COMPARISON:  11/06/2020 FINDINGS: Urinary Tract: Distal ureters and bladder are unremarkable. No urinary tract calculi. Bowel: There is circumferential mural thickening of the cecum, measuring up to 12 mm, nonspecific. No other areas of bowel wall thickening or inflammatory change. No bowel obstruction or ileus. Normal caliber appendix right lower quadrant. Vascular/Lymphatic: Atherosclerosis the identified within the distal aorta and its branches. No pathologic adenopathy visualized within the pelvis. Reproductive:  Prostate is unremarkable. Other:  No free fluid or free gas.  No abdominal wall hernia. Musculoskeletal: No acute or destructive bony lesions. There is severe spondylosis partially visualized at L3-4. Reconstructed images demonstrate no additional findings. IMPRESSION: 1. Nonspecific circumferential wall thickening of the cecum, measuring up to 12 mm in thickness. Correlation with colonoscopy may be useful to exclude underlying neoplasm.  No surrounding inflammatory changes. 2. Otherwise no evidence of intrapelvic metastases. 3.  Aortic Atherosclerosis (ICD10-I70.0). Electronically Signed   By: Randa Ngo M.D.   On: 11/13/2020 15:20   MR Abdomen W Wo Contrast  Result Date: 11/06/2020 CLINICAL DATA:  Weight loss. Outside CT demonstrating a pancreatic abnormality. Epigastric discomfort. EXAM: MRI ABDOMEN WITHOUT AND WITH CONTRAST (INCLUDING MRCP) TECHNIQUE: Multiplanar multisequence MR imaging of the abdomen was performed both before and after the administration of intravenous contrast. Heavily T2-weighted  images of the biliary and pancreatic ducts were obtained, and three-dimensional MRCP images were rendered by post processing. CONTRAST:  8m GADAVIST GADOBUTROL 1 MMOL/ML IV SOLN COMPARISON:  None. FINDINGS: Lower chest: Normal heart size without pericardial or pleural effusion. Hepatobiliary: No suspicious liver lesion. The gallbladder is mildly distended 10.3 cm with small gallstones within. No evidence of acute cholecystitis. Moderate intra and extrahepatic biliary duct dilatation. Common duct 1.7 cm on 16/8. Followed to the level of the pancreatic head. Pancreas: Pancreatic body and tail atrophy with duct dilatation including up to 9 mm on 20/5. Both ducts undergo an abrupt transition in the region of the pancreatic head. A non border deforming pancreatic head mass is subtly apparent at 3.2 x 2.6 cm on 52/23. No superimposed acute pancreatitis. Spleen:  Normal in size, without focal abnormality. Adrenals/Urinary Tract: Normal adrenal glands. Tiny upper pole left renal lesion is likely a cyst. Normal right kidney. No hydronephrosis. Stomach/Bowel: Normal stomach, without wall thickening. Normal abdominal bowel loops. Vascular/Lymphatic: Aortic atherosclerosis. No arterial involvement by tumor. The SMV is adjacent to presumed tumor without encasement, including on 51/23. Portal vein uninvolved. No abdominal adenopathy. A left periaortic 7 mm node on 29/5 is not pathologic by size criteria. Other: No ascites. Subtle left-sided pericolonic nodule of 5 mm on 36/23. Musculoskeletal: No acute osseous abnormality. IMPRESSION: 1. 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 2. No evidence of hepatic metastasis or vascular encasement. The SMV likely contacts tumor over a significantly less than 180 degree span. 3. Minimal left-sided pericolonic nodularity is felt unlikely to represent peritoneal  isolated metastasis. Possibly the sequelae of colonic diverticulosis or even a reactive node. Recommend attention on follow-up. 4. Cholelithiasis. Electronically Signed   By: KAbigail MiyamotoM.D.   On: 11/06/2020 13:49   MR 3D Recon At Scanner  Result Date: 11/06/2020 CLINICAL DATA:  Weight loss. Outside CT demonstrating a pancreatic abnormality. Epigastric discomfort. EXAM: MRI ABDOMEN WITHOUT AND WITH CONTRAST (INCLUDING MRCP) TECHNIQUE: Multiplanar multisequence MR imaging of the abdomen was performed both before and after the administration of intravenous contrast. Heavily T2-weighted images of the biliary and pancreatic ducts were obtained, and three-dimensional MRCP images were rendered by post processing. CONTRAST:  754mGADAVIST GADOBUTROL 1 MMOL/ML IV SOLN COMPARISON:  None. FINDINGS: Lower chest: Normal heart size without pericardial or pleural effusion. Hepatobiliary: No suspicious liver lesion. The gallbladder is mildly distended 10.3 cm with small gallstones within. No evidence of acute cholecystitis. Moderate intra and extrahepatic biliary duct dilatation. Common duct 1.7 cm on 16/8. Followed to the level of the pancreatic head. Pancreas: Pancreatic body and tail atrophy with duct dilatation including up to 9 mm on 20/5. Both ducts undergo an abrupt transition in the region of the pancreatic head. A non border deforming pancreatic head mass is subtly apparent at 3.2 x 2.6 cm on 52/23. No superimposed acute pancreatitis. Spleen:  Normal in size, without  focal abnormality. Adrenals/Urinary Tract: Normal adrenal glands. Tiny upper pole left renal lesion is likely a cyst. Normal right kidney. No hydronephrosis. Stomach/Bowel: Normal stomach, without wall thickening. Normal abdominal bowel loops. Vascular/Lymphatic: Aortic atherosclerosis. No arterial involvement by tumor. The SMV is adjacent to presumed tumor without encasement, including on 51/23. Portal vein uninvolved. No abdominal adenopathy. A left  periaortic 7 mm node on 29/5 is not pathologic by size criteria. Other: No ascites. Subtle left-sided pericolonic nodule of 5 mm on 36/23. Musculoskeletal: No acute osseous abnormality. IMPRESSION: 1. 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 2. No evidence of hepatic metastasis or vascular encasement. The SMV likely contacts tumor over a significantly less than 180 degree span. 3. Minimal left-sided pericolonic nodularity is felt unlikely to represent peritoneal isolated metastasis. Possibly the sequelae of colonic diverticulosis or even a reactive node. Recommend attention on follow-up. 4. Cholelithiasis. Electronically Signed   By: Abigail Miyamoto M.D.   On: 11/06/2020 13:49   DG ERCP BILIARY & PANCREATIC DUCTS  Result Date: 11/11/2020 CLINICAL DATA:  ERCP. EXAM: ERCP TECHNIQUE: Multiple spot images obtained with the fluoroscopic device and submitted for interpretation post-procedure. FLUOROSCOPY TIME:  Fluoroscopy Time:  6 minutes and 2 seconds. Radiation Exposure Index (if provided by the fluoroscopic device): None provided Number of Acquired Spot Images: 6 COMPARISON:  MR abdomen, 11/06/2020. FINDINGS: Multiple, limited oblique planar images of the RIGHT upper quadrant demonstrating endoscopy and common bile duct cannulation. IMPRESSION: Fluoroscopic imaging for ERCP. Please see performing service dictation for complete description of intraprocedural findings. Electronically Signed   By: Michaelle Birks M.D.   On: 11/11/2020 16:19   IR PERCUTANEOUS TRANSHEPATIC CHOLANGIOGRAM  Result Date: 11/12/2020 INDICATION: 70 year old with obstructive jaundice and a pancreatic head mass. Patient needs biliary decompression. EXAM: 1. Percutaneous transhepatic cholangiogram 2. Placement of internal/external biliary drain using ultrasound and fluoroscopic guidance MEDICATIONS: Cefoxitin 2 g; The  antibiotic was administered within an appropriate time frame prior to the initiation of the procedure. ANESTHESIA/SEDATION: Moderate (conscious) sedation was employed during this procedure. A total of Versed 4.0 mg and Fentanyl 100 mcg was administered intravenously. Moderate Sedation Time: 20 minutes. The patient's level of consciousness and vital signs were monitored continuously by radiology nursing throughout the procedure under my direct supervision. FLUOROSCOPY TIME:  Fluoroscopy Time: 3 minutes, 40 mGy COMPLICATIONS: None immediate. PROCEDURE: Informed written consent was obtained from the patient after a thorough discussion of the procedural risks, benefits and alternatives. All questions were addressed.A timeout was performed prior to the initiation of the procedure. The anterior and right side of the abdomen was prepped and draped in sterile fashion. Maximal barrier sterile technique was utilized including caps, mask, sterile gowns, sterile gloves, sterile drape, hand hygiene and skin antiseptic. Ultrasound demonstrated severe intrahepatic biliary dilatation. A dilated peripheral bile duct in the right hepatic lobe was targeted. The skin was anesthetized using 1% lidocaine. Using ultrasound guidance, 21 gauge needle was directed into a dilated bile duct. Bile was draining from the needle. Contrast injection confirmed placement in the biliary system. A 0.018 wire was easily advanced into the biliary system and a transitional dilator set was placed. Bentson wire was placed. Kumpe catheter was advanced over the wire. A Kumpe catheter was advanced into the common bile duct and manipulated into the duodenum using the Bentson wire. The Kumpe catheter was removed and the tract was dilated over the wire to accommodate  a 10 [place] biliary drain. Biliary drain was advanced over the wire and into the duodenum. Pigtail was coiled within the duodenum. Large volume of bile was aspirated. Samples sent for culture and  cytology. Contrast injection was performed. Catheter was attached to a gravity bag. Catheter was sutured to skin and a sterile dressing was placed. FINDINGS: Severe intrahepatic biliary dilatation. A peripheral right hepatic bile duct was successfully cannulated. Cholangiogram demonstrated an obstruction in the distal common bile duct. Catheter and wire were advanced beyond the obstruction and the drain tip was positioned in the duodenum. IMPRESSION: Percutaneous transhepatic cholangiogram demonstrating a distal common bile duct obstruction. Successful placement of an internal/external biliary drain. Drain tip is in the duodenum. Electronically Signed   By: Markus Daft M.D.   On: 11/12/2020 14:14    Assessment and Plan:   Pancreas cancer Outside CT-apparent pancreas abnormality (we do not have a copy of the report) MRI abdomen 11/06/2020-pancreatic body and tail atrophy with duct dilatation up to 9 mm.  Both ducts undergo an abrupt transition in the region of the pancreatic head.  Non border deforming pancreatic head mass measuring 3.2 x 2.6 cm.  No arterial involvement by tumor.  SMV adjacent to presumed tumor without encasement.  Portal vein uninvolved.  No abdominal adenopathy.  Left periaortic 7 mm node not pathologic by size criteria.  No ascites.  Subtle left-sided pericolonic nodule 5 mm. EUS/ERCP 11/11/2020-EGD showed gastritis which was biopsied, mucosal changes in the duodenum, mucosal changes in the duodenum sweep (gastric antral and oxyntic mucosa with no specific histopathologic changes; Warthin Starry stain negative for H pylori).  EUS showed an irregular mass in the pancreatic head measuring 26 mm x 30 mm; the outer margins were irregular.  There was sonographic evidence suggesting invasion into the portal vein (manifested by abutment) and the superior mesenteric vein (manifested by abutment); an intact interface was seen between the mass and the superior mesenteric artery and celiac trunk  suggesting a lack of invasion.  Endosonographic imaging in the visualized portion of the liver showed no mass.  There was dilatation of the common bile duct and the common hepatic duct.  No malignant appearing lymph nodes were visualized in the celiac region, peripancreatic region, porta hepatis region.  FNA biopsy of the pancreas head mass showed malignant cells consistent with adenocarcinoma.  Biliary access could not be obtained.  Cholangiogram 11/12/2020-severe intrahepatic biliary dilatation.  A peripheral right hepatic bile duct was successfully cannulated.  Internal/external biliary drain placed.  Cytology on biliary drain bile showed malignant cells consistent with adenocarcinoma.   CT chest 11/13/2020-no evidence of metastatic disease.   CT pelvis 11/13/2020-nonspecific circumferential wall thickening of the cecum measuring up to 12 mm in thickness.  Otherwise no evidence of intrapelvic metastases.    Painless jaundice-internal/external biliary drain placed 11/12/2020.  Follow-up with Interventional Radiology 11/21/2020. Weight loss Diabetes diagnosed June 2022 Change in bowel habits February 2022 Colonoscopy 08/06/2020-8 mm polyp in the sigmoid colon, a few small and large mouth diverticula in the sigmoid colon, normal mucosa found in the entire colon with biopsies for histology taken with a cold forceps from the right colon and left colon for evaluation of microscopic colitis (right colon-colonic mucosa with no significant pathologic findings; left colon-colonic mucosa with no significant pathologic findings; sigmoid polyp-tubular adenoma, negative for high-grade dysplasia). Hypertension  Mr. Giunta has been diagnosed with pancreas cancer.  Dr. Benay Spice reviewed the diagnosis with him and his daughter at today's visit.  They understand he appears  to have borderline resectable disease.  We had preliminary discussion regarding neoadjuvant chemotherapy.  We checked his bilirubin today.  It remains  markedly elevated.  He has an appointment scheduled with interventional radiology on 11/21/2020.  We will alert them to the persistently elevated bilirubin.  Mr. Staker and his daughter understand the bilirubin must be lowered before any chemotherapy can be given.  We plan to see him back in about 10 days for additional discussion.  We went ahead and made a referral to Dr. Michaelle Birks.  We also made referrals to genetics and nutrition.  Patient seen with Dr. Benay Spice.     Ned Card, NP 11/18/2020, 10:02 AM   This was a shared visit with Ned Card.  Mr. Leadbeater was interviewed and examined.  I reviewed the MRI images and EUS report.  He has been diagnosed with locally advanced adenocarcinoma the head of the pancreas.  He appears to have a borderline resectable tumor.  We discussed treatment of pancreas cancer with [name] and his daughter.  He will be a candidate for neoadjuvant chemotherapy when the biliary obstruction has resolved.  We will refer him to surgical oncology.  His case will be presented at the GI tumor conference.  He will return for an office visit in approximately 10 days.  We will most likely recommend neoadjuvant gemcitabine/Abraxane.  We can consider neoadjuvant FOLFIRINOX if he has a marked improvement in his performance status.  I was present for greater than 50% of today's visit.  I performed medical decision making.  Julieanne Manson, MD

## 2020-11-18 NOTE — Telephone Encounter (Signed)
Spoke to [place] in Interventional Radiology regarding persistent elevation of bilirubin despite internal/external drain placement.  She will discuss further with a provider.

## 2020-11-18 NOTE — Progress Notes (Signed)
Faxed referral order, demographics and chart information to [facility] att: Dr. Michaelle Birks at [phone].

## 2020-11-18 NOTE — Telephone Encounter (Signed)
-----   Message from Owens Shark, NP sent at 11/18/2020  2:27 PM EDT ----- Please let him know Dr Benay Spice recommends he try benadryl for sleep rather than a prescription medication due to the liver dysfunction. Also let him the bilirubin remains elevated and we have notified IR.

## 2020-11-19 ENCOUNTER — Other Ambulatory Visit (HOSPITAL_COMMUNITY): Payer: Self-pay | Admitting: Interventional Radiology

## 2020-11-19 ENCOUNTER — Other Ambulatory Visit (HOSPITAL_COMMUNITY): Payer: Self-pay | Admitting: Student

## 2020-11-19 ENCOUNTER — Other Ambulatory Visit: Payer: Self-pay

## 2020-11-19 ENCOUNTER — Ambulatory Visit (HOSPITAL_COMMUNITY)
Admission: RE | Admit: 2020-11-19 | Discharge: 2020-11-19 | Disposition: A | Discharge status: Home / Self Care | Payer: Medicare Other | Source: Ambulatory Visit | Attending: Student | Admitting: Student

## 2020-11-19 ENCOUNTER — Encounter (HOSPITAL_COMMUNITY): Payer: Self-pay

## 2020-11-19 DIAGNOSIS — K831 Obstruction of bile duct: Secondary | ICD-10-CM

## 2020-11-19 DIAGNOSIS — E119 Type 2 diabetes mellitus without complications: Secondary | ICD-10-CM | POA: Diagnosis not present

## 2020-11-19 DIAGNOSIS — C259 Malignant neoplasm of pancreas, unspecified: Secondary | ICD-10-CM | POA: Insufficient documentation

## 2020-11-19 HISTORY — PX: IR BILIARY STENT(S) EXISTING ACCESS INC DILATION CATH EXCHANGE: IMG6048

## 2020-11-19 LAB — CBC WITH DIFFERENTIAL/PLATELET
Abs Immature Granulocytes: 0.25 10*3/uL — ABNORMAL HIGH (ref 0.00–0.07)
Basophils Absolute: 0.1 10*3/uL (ref 0.0–0.1)
Basophils Relative: 1 %
Eosinophils Absolute: 0.4 10*3/uL (ref 0.0–0.5)
Eosinophils Relative: 3 %
HCT: 37.5 % — ABNORMAL LOW (ref 39.0–52.0)
Hemoglobin: 12.3 g/dL — ABNORMAL LOW (ref 13.0–17.0)
Immature Granulocytes: 2 %
Lymphocytes Relative: 28 %
Lymphs Abs: 3.1 10*3/uL (ref 0.7–4.0)
MCH: 29.9 pg (ref 26.0–34.0)
MCHC: 32.8 g/dL (ref 30.0–36.0)
MCV: 91 fL (ref 80.0–100.0)
Monocytes Absolute: 1.3 10*3/uL — ABNORMAL HIGH (ref 0.1–1.0)
Monocytes Relative: 12 %
Neutro Abs: 6 10*3/uL (ref 1.7–7.7)
Neutrophils Relative %: 54 %
Platelets: 341 10*3/uL (ref 150–400)
RBC: 4.12 MIL/uL — ABNORMAL LOW (ref 4.22–5.81)
RDW: 18.3 % — ABNORMAL HIGH (ref 11.5–15.5)
WBC: 11.1 10*3/uL — ABNORMAL HIGH (ref 4.0–10.5)
nRBC: 0 % (ref 0.0–0.2)

## 2020-11-19 LAB — PROTIME-INR
INR: 0.9 (ref 0.8–1.2)
Prothrombin Time: 12.2 seconds (ref 11.4–15.2)

## 2020-11-19 LAB — GLUCOSE, CAPILLARY: Glucose-Capillary: 211 mg/dL — ABNORMAL HIGH (ref 70–99)

## 2020-11-19 IMAGING — XA IR BILIARY STENT EXISTING ACCESSINC DILATION, CATH EXCHANGE
5 series · 14 of 17 positions shown · IV contrast (IODINE)
Comparison: none

INDICATION: 74-year-old male with malignant obstructed jaundice secondary to
pancreatic cancer. Percutaneous internal/external biliary drainage
catheter was placed on [DATE] after an unsuccessful attempted
ERCP on [DATE]. Patient remains jaundiced and presents today for
transhepatic biliary stent placement.

[Series 1: care body 4 · 2 acquisitions, 7 frames shown (1 of 3)]
[im 1/2]
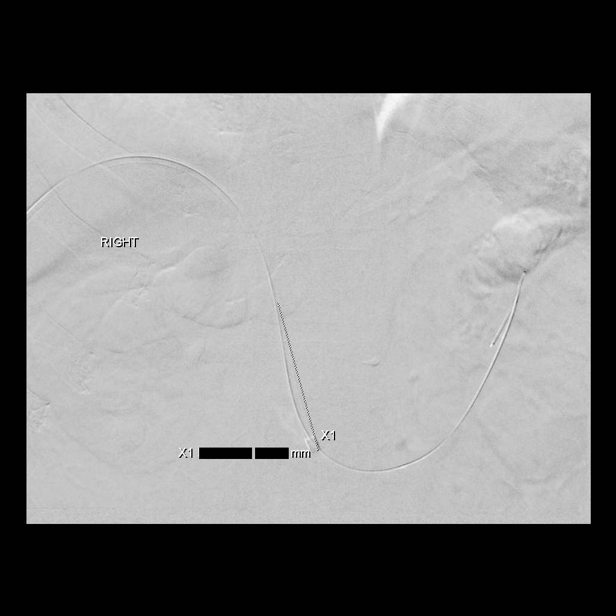
[im 1/2]
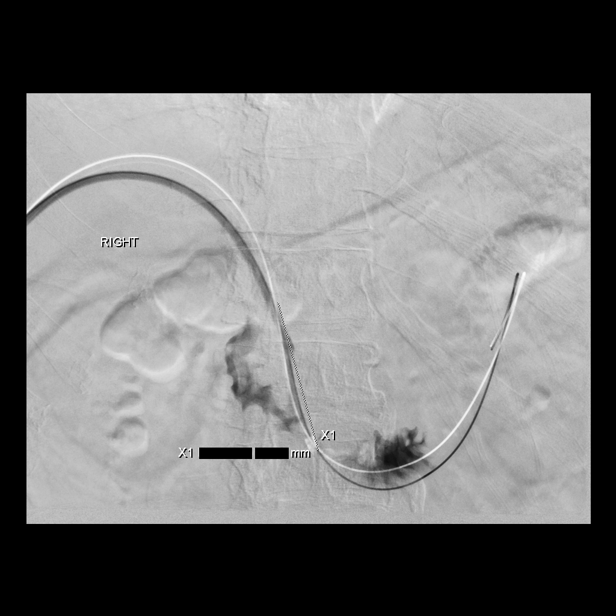
[im 1/2]
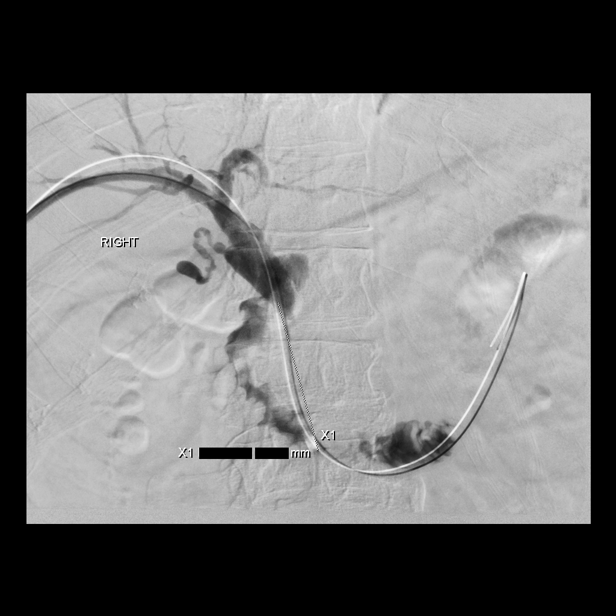
[im 2/2]
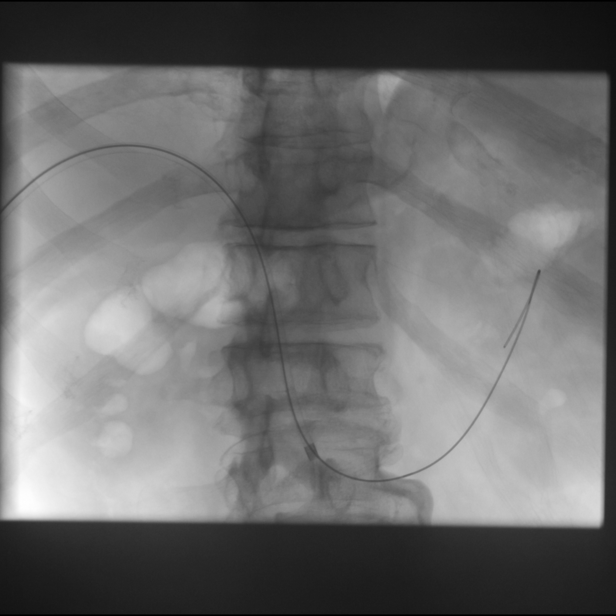
[im 2/2]
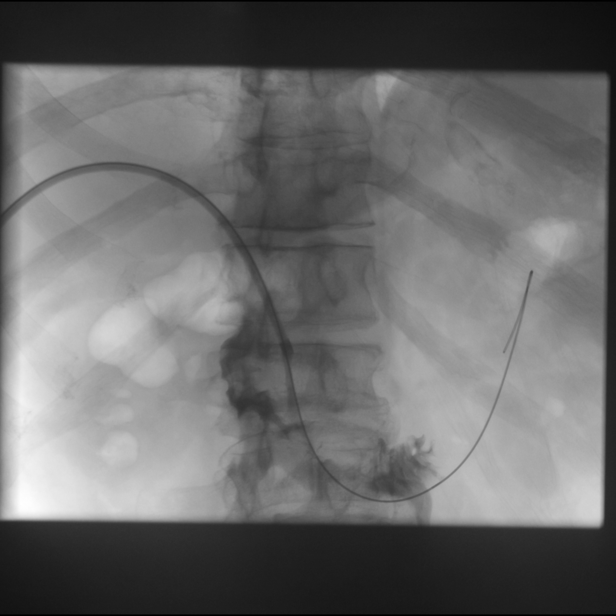
[im 2/2]
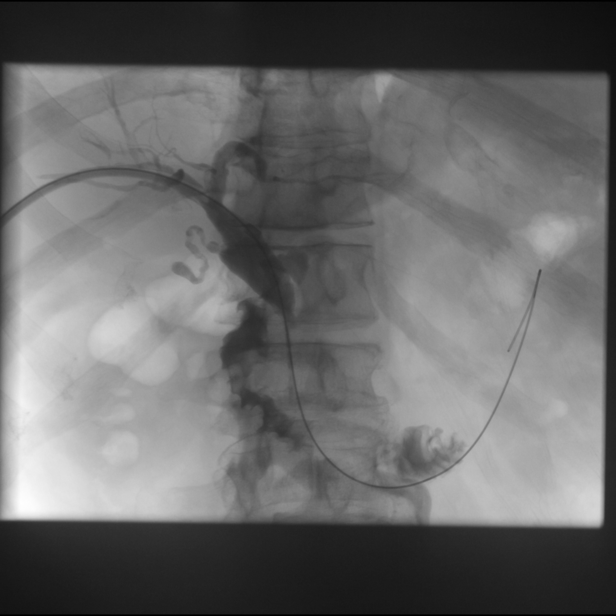
[im 2/2]
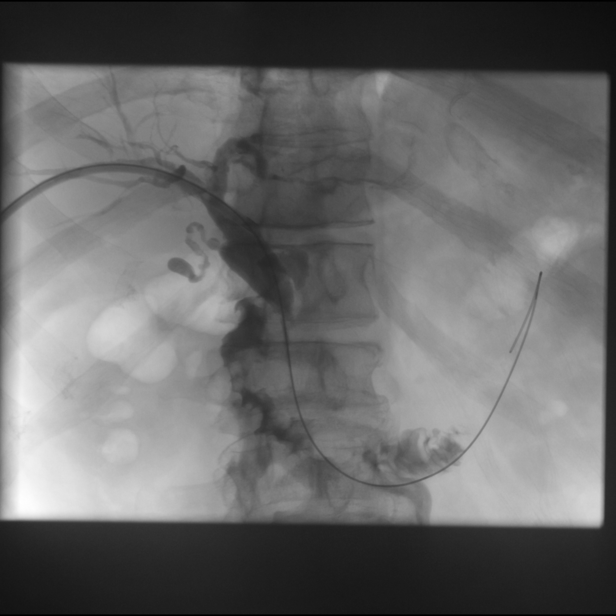

[Series 2: care body 4 · 1 of 2 slices shown (2 of 3)]
[im 2/2]
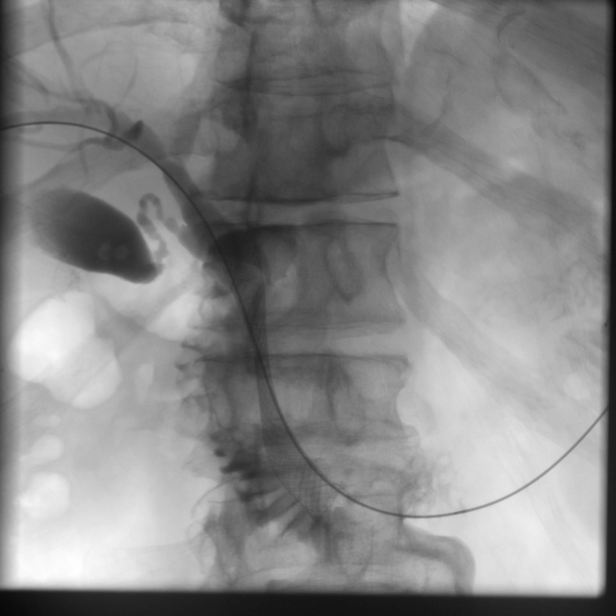

[Series 3: fl - angio · 4 of 22 frames shown]
[frame 4/22]
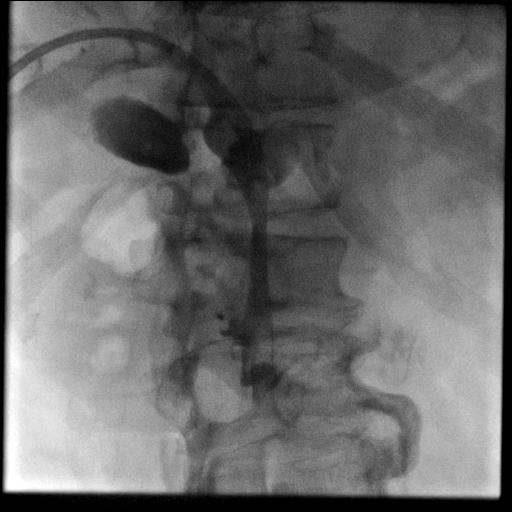
[frame 11/22]
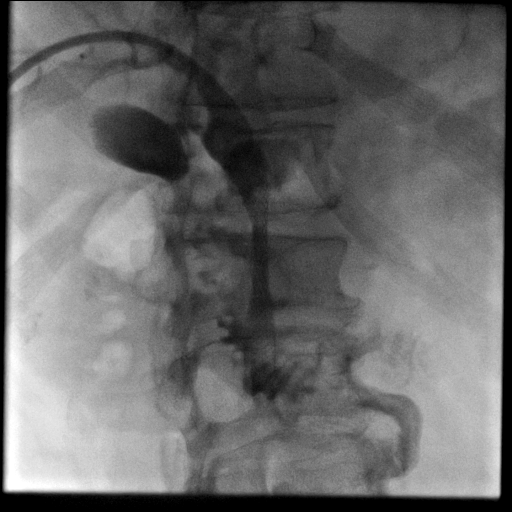
[frame 12/22]
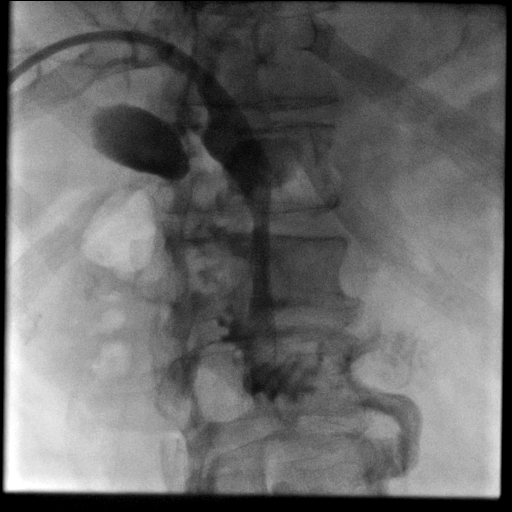
[frame 19/22]
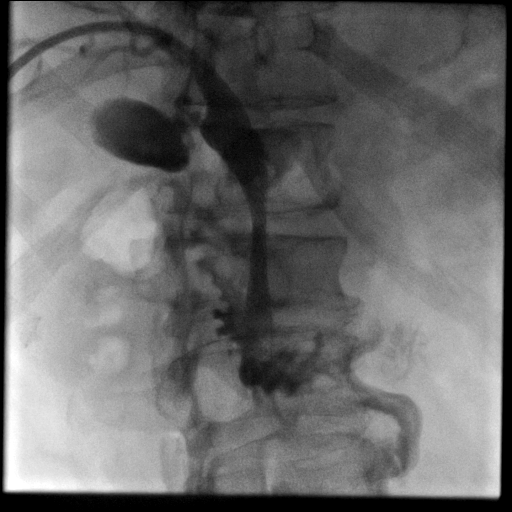

[Series 4: care body 4 · 1 of 2 slices shown (3 of 3)]
[im 2/2]
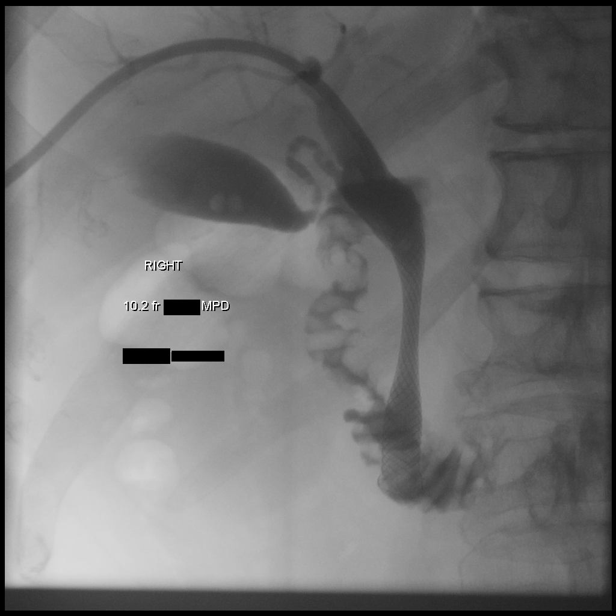

[Series 300: ir radiologist eval & mgmt · 1 of 1 slices shown]
[im 1/1]
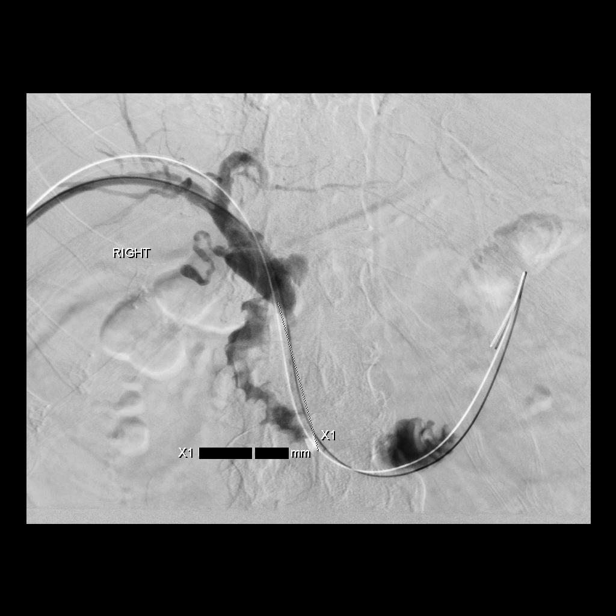

[14 of 17 positions shown; findings below may reference images not displayed]

EXAM:
1. Biliary stent placement through existing access
2. Biliary drain exchange

MEDICATIONS:
2 g Rocephin; The antibiotic was administered within an appropriate
time frame prior to the initiation of the procedure.

ANESTHESIA/SEDATION:
Moderate (conscious) sedation was employed during this procedure. A
total of Versed 2 mg and Fentanyl 100 mcg was administered
intravenously.

Moderate Sedation Time: 15 minutes. The patient's level of
consciousness and vital signs were monitored continuously by
radiology nursing throughout the procedure under my direct
supervision.

FLUOROSCOPY TIME:  Fluoroscopy Time: 3 minutes 18 seconds (42 mGy).

COMPLICATIONS:
None immediate.

PROCEDURE:
Informed written consent was obtained from the patient after a
thorough discussion of the procedural risks, benefits and
alternatives. All questions were addressed. Maximal Sterile Barrier
Technique was utilized including caps, mask, sterile gowns, sterile
gloves, sterile drape, hand hygiene and skin antiseptic. A timeout
was performed prior to the initiation of the procedure.

Local anesthesia was attained by infiltration with 1% lidocaine. The
retention suture on the existing internal/external biliary drainage
catheter was cut. The drainage catheter was transected and removed
over a Bentson wire. A 5 French angled catheter was advanced over
the wire and into the duodenum. The catheter and wire combination
were advanced into the proximal jejunum. The Bentson wire was
exchanged for an Amplatz wire.

The 5 French catheter was exchanged for a 9 French sheath which was
advanced into the duodenum. A pull-back cholangiogram was then
performed confirming persistent occlusion of the mid and distal
common bile duct. The occlusion length measures approximately
cm. Therefore, a 10 mm x 6 cm wall flex covered biliary stent was
selected.

The sheath was readvanced over the wire into the duodenum. The stent
was positioned across the stenosis before being unsheathed. The
stent was then successfully deployed. The Amplatz wire was brought
back and capped in the common hepatic duct. A new 10.2 DANILO NINO
DANILO NINO drainage catheter was advanced over the wire and formed in
the common hepatic duct. The catheter was secured to the skin with 0
Prolene suture. Sterile bandages were applied.
IMPRESSION: 1. Successful placement of a self expanding covered biliary stent
measuring 10 x 60 mm.
2. Biliary drain exchange with a 10.2 DANILO NINO catheter
left in the common hepatic duct above the biliary stent. The tube
was left capped to initiate a physiologic trial.

PLAN:
Return Interventional Radiology in 2 weeks for serum bilirubin
evaluation, cholangiogram and possible drain removal.

## 2020-11-19 MED ORDER — MIDAZOLAM HCL 2 MG/2ML IJ SOLN
INTRAMUSCULAR | Status: AC | PRN
Start: 2020-11-19 — End: 2020-11-19
  Administered 2020-11-19: 2 mg via INTRAVENOUS
  Administered 2020-11-19 (×2): 1 mg via INTRAVENOUS

## 2020-11-19 MED ORDER — LIDOCAINE-EPINEPHRINE 1 %-1:100000 IJ SOLN
INTRAMUSCULAR | Status: AC | PRN
  Administered 2020-11-19: 5 mL

## 2020-11-19 MED ORDER — FENTANYL CITRATE (PF) 100 MCG/2ML IJ SOLN
INTRAMUSCULAR | Status: AC | PRN
  Administered 2020-11-19 (×2): 50 ug via INTRAVENOUS

## 2020-11-19 MED ORDER — MIDAZOLAM HCL 2 MG/2ML IJ SOLN
INTRAMUSCULAR | Status: AC
  Filled 2020-11-19: qty 4

## 2020-11-19 MED ORDER — FENTANYL CITRATE (PF) 100 MCG/2ML IJ SOLN
INTRAMUSCULAR | Status: AC
  Filled 2020-11-19: qty 2

## 2020-11-19 MED ORDER — HYDROMORPHONE HCL 1 MG/ML IJ SOLN
1.0000 mg | Freq: Once | INTRAMUSCULAR | Status: AC
  Administered 2020-11-19: 1 mg via INTRAVENOUS
  Filled 2020-11-19: qty 1

## 2020-11-19 MED ORDER — IOHEXOL 300 MG/ML  SOLN
50.0000 mL | Freq: Once | INTRAMUSCULAR | Status: AC | PRN
  Administered 2020-11-19: 15 mL

## 2020-11-19 MED ORDER — LIDOCAINE-EPINEPHRINE 1 %-1:100000 IJ SOLN
INTRAMUSCULAR | Status: AC
  Filled 2020-11-19: qty 1

## 2020-11-19 MED ORDER — SODIUM CHLORIDE 0.9 % IV SOLN: INTRAVENOUS | Status: DC

## 2020-11-19 MED ORDER — SODIUM CHLORIDE 0.9 % IV SOLN
2.0000 g | INTRAVENOUS | Status: AC
  Administered 2020-11-19: 2 g via INTRAVENOUS
  Filled 2020-11-19 (×2): qty 20

## 2020-11-19 NOTE — Discharge Instructions (Addendum)
Please call Interventional Radiology clinic [phone] with any questions or concerns.  You may change your dressing and shower tomorrow.  Keep dressing clean and dry.  If you experience and nausea, vomiting , leaking around drain tube or increased itching, connect to drainage bag and call Interventional Radiology [phone].  Recheck appointment in 2 weeks.   Biliary Drainage Catheter Home Guide A biliary drainage catheter is a thin, flexible tube that is inserted through your skin into the bile ducts in your liver. Bile is a thick yellow or green fluid that helps digest fat in foods. The purpose of a biliary drainage catheter is to keep bile from backing up into your liver. There are three kinds of biliary drainage catheter systems. Follow general guidelines and instructions for your specific drainage system. Your health care provider will explain how to: Inspect your drainage catheter. Flush your drainage catheter. Attach and empty your collection bag. Change your bandage (dressing). What are the risks? The biliary drainage catheter system is generally safe. However, problems may occur, including: Infection. Bleeding. A dislodged or blocked catheter. Supplies needed: Supplies you will need to change your dressing: Mild soap and warm water. A clean cloth. Split gauze pads, 4 x 4 inches (10 x 10 cm) to use as a dressing sponge. Gauze pads, 4 x 4 inches (10 x 10 cm), or adhesive dressing cover. Paper tape. Supplies you will need to flush your drainage catheter: Alcohol swab. 10 mL prefilled normal salt-water (saline) syringe. Supplies you will need to empty your collection bag: Drainage container. Tissue or disposable napkin. Measuring container, if needed. How to care for your drainage catheter How to inspect your drainage catheter Check your dressing to make sure that it is dry and clean. Change your dressing if it comes loose or gets wet or dirty. Check your collection  bag to make sure that drainage fluid is flowing into the bag well. Note the color and amount compared with the color and amount on other days. Check your drainage catheter and bag for any cracks or kinks in the tubing. How to flush your drainage catheter Biliary drainagecatheters should be flushed daily, or as often as told by your health care provider. The end of your drainage catheter is closed using an IV cap. You can connect the syringe directly to the IV cap. Wash your hands with soap and water for at least 20 seconds. If your drainage catheter has an external valve (stopcock) attached to it, turn the stopcock toward the collection bag. This will allow the saline to flow in the direction of your body. Clean the IV cap with an alcohol swab. Screw the tip of a 10 mL normal saline syringe onto the IV cap. Inject the saline for 5-10 seconds. If you feel resistance while injecting, stop right away. Do not pull back on the plunger. Pulling back on the plunger could increase your risk of infection. Remove the syringe from the cap. Turn the stopcock so that fluid flows from your body into your collection bag. You may notice more fluid flowing into the bag after you have completed the flush. How to attach a bag to your drainage catheter If you are having trouble with your internal biliary drain, your health care provider may direct you to use bag drainage until you can be seen to fix the problem. For this reason, you should always have a collection bag and connecting tubing at home. If you do not have these supplies, remember to ask for them at  your next appointment. Remove the bag and the connecting tubing from their packaging. Connect the funnel end of the tubing to the bag's cone-shaped stem. Remove the IV cap from the biliary drain. To do this, unscrew the cap and replace it with the screw-on end of the tubing. Save the IV cap in a plastic storage bag that can be sealed. How to empty your collection  bag Empty the collection bag whenever it becomes ? full. Also empty it before you go to sleep. Most collection bags have a drainage valve at the bottom that allows them to be emptied easily. Wash your hands with soap and water for at least 20 seconds. Hold the collection bag over the toilet, basin, or collection container. Use a measuring container if your health care provider told you to measure the drainage. Unscrew the valve to open it, and allow the bag to drain. Close the valve securely to avoid leakage. Use a tissue or disposable napkin to wipe the valve clean. Measure the amount of drainage and then dispose of the fluid in the toilet. Wash the measuring container with soap and water. Record the amount of drainage as told. How to change your dressing The dressing over your drainage catheter may be changed weekly, or more often if needed to keep the dressing dry and clean. Your health care provider will tell you how often to change your dressing. Wash your hands with soap and water for at least 20 seconds. Gently remove your old dressing. Avoid using scissors to remove the dressing because they may damage the drainage catheter. Wash the skin around your insertion site with mild soap and warm water. Then, rinse well and pat the area dry with a clean cloth. Check the skin around your drainage catheter for redness or swelling, or for yellow or green fluid that has a bad smell. Also check for a rash, warmth, or skin breakdown. If your drainage catheter was stitched (sutured) to your skin, inspect the suture to make sure it is still anchored in your skin. Do not apply creams, ointments, or alcohol to the site. Allow your skin to air-dry completely before you apply a new dressing. Place your drainage catheter through the slit in a dressing sponge. The dressing sponge should slide under the disk that holds the drainage catheter in place. Cover the drainage catheter and the dressing sponge. Cover  the catheter and sponge with a 4 x 4 inch (10 x 10 cm) gauze. The drainage catheter should rest on the gauze and not on your skin. Then tape the dressing to your skin. If told, use an adhesive dressing covering over the top of the dressing in place of the gauze and tape. Wash your hands with soap and water for at least 20 seconds. Follow these instructions at home Keep all follow-up visits as told by your health care provider. This is important. Contact a health care provider if: Your pain gets worse after it had improved, and it is not relieved with pain medicines. You have any questions about caring for your drainage catheter or collection bag. The skin around your catheter insertion site breaks down. You have any of these signs of infection around your catheter insertion site: More redness, swelling, or pain. Fluid or blood. Warmth. Pus or a bad smell. Get help right away if: You have a fever or chills. You have bile leaking around your drainage catheter. Your drainage catheter becomes blocked or clogged. Your drainage catheter comes out. Summary Bile is a  thick yellow or green fluid that helps digest fat in foods. The purpose of a biliary drainage catheter is to keep bile from backing up into your liver. Check the skin around your drainage catheter for redness or swelling, or for yellow or green fluid that has a bad smell. Also check for a rash or skin breakdown. Biliary drainagecatheters should be flushed daily, or as often as told by your health care provider. Empty the collection bag whenever it becomes ? full. Also empty it before you go to sleep. This information is not intended to replace advice given to you by your health care provider. Make sure you discuss any questions you have with your health care provider. Document Revised: 05/01/2019 Document Reviewed: 12/27/2018 Elsevier Patient Education  [place].   Moderate Conscious Sedation, Adult, Care After This sheet  gives you information about how to care for yourself after your procedure. Your health care provider may also give you more specific instructions. If you have problems or questions, contact your health care provider. What can I expect after the procedure? After the procedure, it is common to have: Sleepiness for several hours. Impaired judgment for several hours. Difficulty with balance. Vomiting if you eat too soon. Follow these instructions at home: For the time period you were told by your health care provider:    Rest. Do not participate in activities where you could fall or become injured. Do not drive or use machinery. Do not drink alcohol. Do not take sleeping pills or medicines that cause drowsiness. Do not make important decisions or sign legal documents. Do not take care of children on your own. Eating and drinking Follow the diet recommended by your health care provider. Drink enough fluid to keep your urine pale yellow. If you vomit: Drink water, juice, or soup when you can drink without vomiting. Make sure you have little or no nausea before eating solid foods. General instructions Take over-the-counter and prescription medicines only as told by your health care provider. Have a responsible adult stay with you for the time you are told. It is important to have someone help care for you until you are awake and alert. Do not smoke. Keep all follow-up visits as told by your health care provider. This is important. Contact a health care provider if: You are still sleepy or having trouble with balance after 24 hours. You feel light-headed. You keep feeling nauseous or you keep vomiting. You develop a rash. You have a fever. You have redness or swelling around the IV site. Get help right away if: You have trouble breathing. You have new-onset confusion at home. Summary After the procedure, it is common to feel sleepy, have impaired judgment, or feel nauseous if you eat  too soon. Rest after you get home. Know the things you should not do after the procedure. Follow the diet recommended by your health care provider and drink enough fluid to keep your urine pale yellow. Get help right away if you have trouble breathing or new-onset confusion at home. This information is not intended to replace advice given to you by your health care provider. Make sure you discuss any questions you have with your health care provider. Document Revised: 07/06/2019 Document Reviewed: 02/01/2019 Elsevier Patient Education  2022 [employer].

## 2020-11-19 NOTE — Procedures (Signed)
Interventional Radiology Procedure Note  Procedure: Biliary stent placement and biliary drain exchange.   Complications: None  Estimated Blood Loss: None  Recommendations: - Tube capped - Return to IR in 2 weeks for Br check, cholangiogram and possible tube removal.    Signed,  Criselda Peaches, MD

## 2020-11-19 NOTE — Progress Notes (Signed)
Aimee, PA at bedside evaluating patient's lower abd pain. Per Aimee, Will keep patient till 1300 and administer pain medication.

## 2020-11-20 ENCOUNTER — Telehealth: Payer: Self-pay | Admitting: *Deleted

## 2020-11-20 ENCOUNTER — Encounter: Payer: Self-pay | Admitting: *Deleted

## 2020-11-20 NOTE — Telephone Encounter (Signed)
Wife called to inquire with his diabetes what is the best supplement for him? He is "not eating much at all". She is not aware of what his glucose readings have been running. After discussion w/dietician, Ernestene Kiel, informed wife that his goal is a minimum of 2000 calories/day. Suggested Ensure Complete 3-4 cans/day and to keep glucose readings < 180. Instructed wife to call PCP about his current medical condition and that he needs him to manage his diabetes/insulin. She agrees to call. Confirmed his meeting on 9/7 with dietician.

## 2020-11-21 ENCOUNTER — Other Ambulatory Visit (HOSPITAL_COMMUNITY): Payer: Medicare Other

## 2020-11-21 ENCOUNTER — Ambulatory Visit (HOSPITAL_COMMUNITY): Payer: Medicare Other

## 2020-11-26 ENCOUNTER — Telehealth: Payer: Self-pay | Admitting: *Deleted

## 2020-11-26 ENCOUNTER — Inpatient Hospital Stay: Payer: Medicare Other | Attending: Nurse Practitioner | Admitting: Nutrition

## 2020-11-26 ENCOUNTER — Other Ambulatory Visit: Payer: Self-pay

## 2020-11-26 DIAGNOSIS — C251 Malignant neoplasm of body of pancreas: Secondary | ICD-10-CM | POA: Insufficient documentation

## 2020-11-26 DIAGNOSIS — E119 Type 2 diabetes mellitus without complications: Secondary | ICD-10-CM | POA: Insufficient documentation

## 2020-11-26 DIAGNOSIS — I10 Essential (primary) hypertension: Secondary | ICD-10-CM | POA: Insufficient documentation

## 2020-11-26 MED ORDER — DIPHENHYDRAMINE-APAP (SLEEP) 25-500 MG PO TABS: 1.0000 | ORAL_TABLET | Freq: Every evening | ORAL | Status: AC | PRN

## 2020-11-26 NOTE — Telephone Encounter (Signed)
Patient is asking for something to help him sleep at night. Has not tried OTC Benadryl yet.

## 2020-11-26 NOTE — Progress Notes (Signed)
70 year old male diagnosed with Pancreas Cancer followed by Dr. Benay Spice. Patient with biliary drain secondary to obstruction.  PMH includes HTN, HLD, DM, Sleep apnea, hearing deficit.  Medications include Xanax, Insulin  Labs include Na 129, Glucose 294, BUN 26 on Aug 30. CBG's [id number].  Height: 68 inches Weight: 150.0 lb UBW: 185-195 pounds. BMI:22.81  Patient reports increased fatigue and endorses weight loss. Reports he has a good appetite now and craves cold foods. States he really wants a milkshake. He has been drinking 2 Ensure Max daily. Denies nausea, vomiting, constipation and diarrhea. Reports history of soft, oily stools however, this is resolved. Has been trying to restrict simple sugars. His insulin is being adjusted based on CBGs.  Nutrition Diagnosis: Unintended weight loss related to pancreas cancer and associated treatments as evidenced by 19% wt loss from usual body weight.  Intervention: Educated on importance of adequate calories and protein for weight maintenance. Educated on no concentrated sweets diet and provided fact sheet. Reviewed high calorie, high protein foods and recipes. Provided fact sheets. Continue to follow up with MD regarding medication management for blood sugars. Recommended 3 small meals with 3 snacks daily. Questions answered and teach back method used. Contact information given.  Monitoring, Evaluation, Goals: Patient will tolerate adequate calories and protein to maintain lean body mass.  Next Visit: To be scheduled as needed.

## 2020-11-26 NOTE — Telephone Encounter (Signed)
Per Dr. Benay Spice: for sleep try Tylenol PM. Does not want to give stronger med due to liver functions.  Patient notified and states Dr. Zenia Resides did more lab work today and he may need a procedure tomorrow if liver functions still elevated. He expects a call tonight or tomorrow. Asking about his f/u here on 9/9. Informed RN will f/u tomorrow w/MD to determine.

## 2020-11-27 ENCOUNTER — Observation Stay: Admission: AD | Admit: 2020-11-27 | Payer: Medicare Other | Source: Ambulatory Visit | Admitting: Surgery

## 2020-11-27 ENCOUNTER — Other Ambulatory Visit: Payer: Self-pay | Admitting: Surgery

## 2020-11-27 DIAGNOSIS — K831 Obstruction of bile duct: Secondary | ICD-10-CM

## 2020-11-27 DIAGNOSIS — C801 Malignant (primary) neoplasm, unspecified: Secondary | ICD-10-CM

## 2020-11-28 ENCOUNTER — Inpatient Hospital Stay: Payer: Medicare Other | Admitting: Oncology

## 2020-11-28 ENCOUNTER — Observation Stay (HOSPITAL_COMMUNITY): Payer: Medicare Other

## 2020-11-28 ENCOUNTER — Observation Stay (HOSPITAL_COMMUNITY)
Admission: RE | Admit: 2020-11-28 | Discharge: 2020-11-30 | Disposition: A | Discharge status: Home / Self Care | Payer: Medicare Other | Source: Ambulatory Visit | Attending: Surgery | Admitting: Surgery

## 2020-11-28 ENCOUNTER — Other Ambulatory Visit: Payer: Self-pay

## 2020-11-28 ENCOUNTER — Other Ambulatory Visit: Payer: Self-pay | Admitting: Student

## 2020-11-28 ENCOUNTER — Encounter (HOSPITAL_COMMUNITY): Payer: Self-pay

## 2020-11-28 DIAGNOSIS — E1165 Type 2 diabetes mellitus with hyperglycemia: Secondary | ICD-10-CM

## 2020-11-28 DIAGNOSIS — L298 Other pruritus: Secondary | ICD-10-CM | POA: Diagnosis present

## 2020-11-28 DIAGNOSIS — F17290 Nicotine dependence, other tobacco product, uncomplicated: Secondary | ICD-10-CM | POA: Insufficient documentation

## 2020-11-28 DIAGNOSIS — K8689 Other specified diseases of pancreas: Secondary | ICD-10-CM | POA: Diagnosis present

## 2020-11-28 DIAGNOSIS — E119 Type 2 diabetes mellitus without complications: Secondary | ICD-10-CM | POA: Diagnosis not present

## 2020-11-28 DIAGNOSIS — E43 Unspecified severe protein-calorie malnutrition: Secondary | ICD-10-CM | POA: Insufficient documentation

## 2020-11-28 DIAGNOSIS — Z794 Long term (current) use of insulin: Secondary | ICD-10-CM | POA: Insufficient documentation

## 2020-11-28 DIAGNOSIS — Z79899 Other long term (current) drug therapy: Secondary | ICD-10-CM | POA: Diagnosis not present

## 2020-11-28 DIAGNOSIS — R17 Unspecified jaundice: Secondary | ICD-10-CM | POA: Diagnosis present

## 2020-11-28 DIAGNOSIS — K831 Obstruction of bile duct: Secondary | ICD-10-CM

## 2020-11-28 DIAGNOSIS — Z23 Encounter for immunization: Secondary | ICD-10-CM | POA: Diagnosis not present

## 2020-11-28 DIAGNOSIS — C250 Malignant neoplasm of head of pancreas: Principal | ICD-10-CM | POA: Insufficient documentation

## 2020-11-28 DIAGNOSIS — I10 Essential (primary) hypertension: Secondary | ICD-10-CM | POA: Insufficient documentation

## 2020-11-28 DIAGNOSIS — C801 Malignant (primary) neoplasm, unspecified: Secondary | ICD-10-CM | POA: Diagnosis present

## 2020-11-28 HISTORY — PX: IR CONVERT BILIARY DRAIN TO INT EXT BILIARY DRAIN: IMG6045

## 2020-11-28 LAB — COMPREHENSIVE METABOLIC PANEL
ALT: 59 U/L — ABNORMAL HIGH (ref 0–44)
AST: 63 U/L — ABNORMAL HIGH (ref 15–41)
Albumin: 2.8 g/dL — ABNORMAL LOW (ref 3.5–5.0)
Alkaline Phosphatase: 159 U/L — ABNORMAL HIGH (ref 38–126)
Anion gap: 12 (ref 5–15)
BUN: 19 mg/dL (ref 8–23)
CO2: 20 mmol/L — ABNORMAL LOW (ref 22–32)
Calcium: 9.4 mg/dL (ref 8.9–10.3)
Chloride: 101 mmol/L (ref 98–111)
Creatinine, Ser: 1.01 mg/dL (ref 0.61–1.24)
GFR, Estimated: >60 mL/min (ref >60)
Glucose, Bld: 243 mg/dL — ABNORMAL HIGH (ref 70–99)
Potassium: 3.5 mmol/L (ref 3.5–5.1)
Sodium: 133 mmol/L — ABNORMAL LOW (ref 135–145)
Total Bilirubin: 9.6 mg/dL — ABNORMAL HIGH (ref 0.3–1.2)
Total Protein: 6.7 g/dL (ref 6.5–8.1)

## 2020-11-28 LAB — GLUCOSE, CAPILLARY
Glucose-Capillary: 225 mg/dL — ABNORMAL HIGH (ref 70–99)
Glucose-Capillary: 284 mg/dL — ABNORMAL HIGH (ref 70–99)
Glucose-Capillary: 301 mg/dL — ABNORMAL HIGH (ref 70–99)

## 2020-11-28 LAB — CBC
HCT: 35.1 % — ABNORMAL LOW (ref 39.0–52.0)
Hemoglobin: 11.6 g/dL — ABNORMAL LOW (ref 13.0–17.0)
MCH: 30.6 pg (ref 26.0–34.0)
MCHC: 33 g/dL (ref 30.0–36.0)
MCV: 92.6 fL (ref 80.0–100.0)
Platelets: 258 10*3/uL (ref 150–400)
RBC: 3.79 MIL/uL — ABNORMAL LOW (ref 4.22–5.81)
RDW: 18 % — ABNORMAL HIGH (ref 11.5–15.5)
WBC: 9.9 10*3/uL (ref 4.0–10.5)
nRBC: 0 % (ref 0.0–0.2)

## 2020-11-28 LAB — PROTIME-INR
INR: 0.9 (ref 0.8–1.2)
Prothrombin Time: 12.4 seconds (ref 11.4–15.2)

## 2020-11-28 IMAGING — CT CT ABD-PEL WO/W CM
3 of 14 series · 12 of 46 positions shown, 15 images · IV contrast (omnipaque)
Comparison: MRI [DATE].

CLINICAL DATA: Staging pancreas mass. Evaluate for vascular
invasion.

EXAM:
CT ABDOMEN AND PELVIS WITHOUT AND WITH CONTRAST
TECHNIQUE: Multidetector CT imaging of the abdomen and pelvis was performed
following the standard protocol before and following the bolus
administration of intravenous contrast.
CONTRAST:  80mL OMNIPAQUE IOHEXOL 350 MG/ML SOLN

[Series 4: venous phase 5.0 i30f 2 · axial · portal-venous · 0.83mm/px · z∈[-494,-494]mm · 1 of 99 slices shown, 3 images]
[im 1/99  soft-tissue]
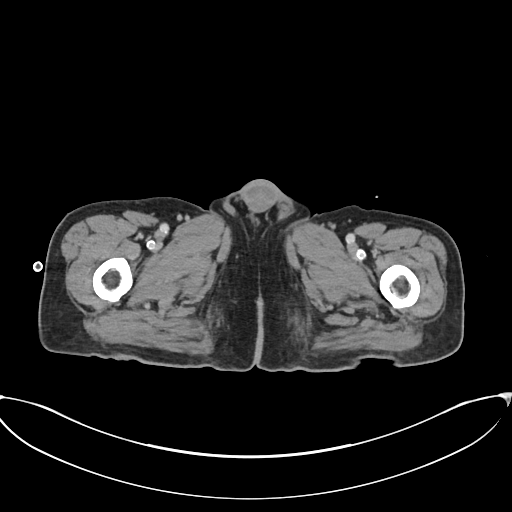
[im 1/99  lung]
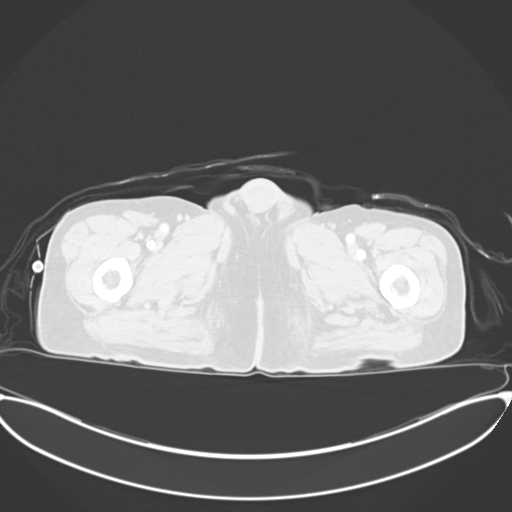
[im 1/99  bone]
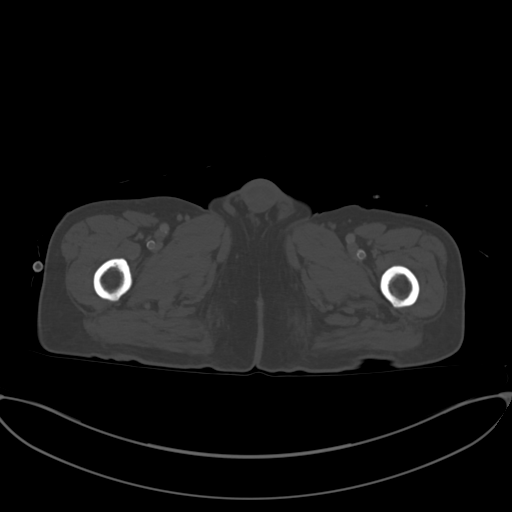

[Series 10: thins · axial · 0.83mm/px · z∈[-457,-45]mm · 8 of 619 slices shown]
[im 52/619  soft-tissue]
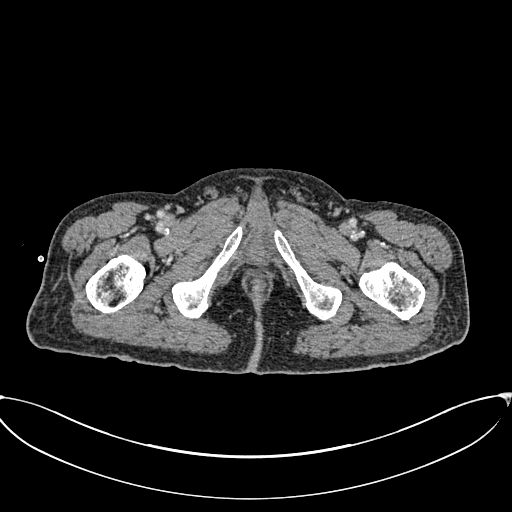
[im 155/619  soft-tissue]
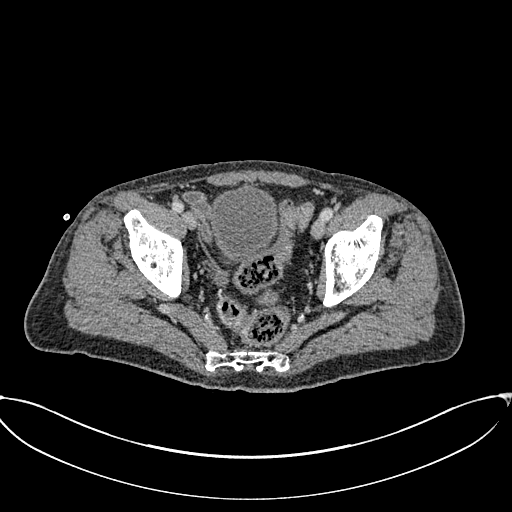
[im 207/619  soft-tissue]
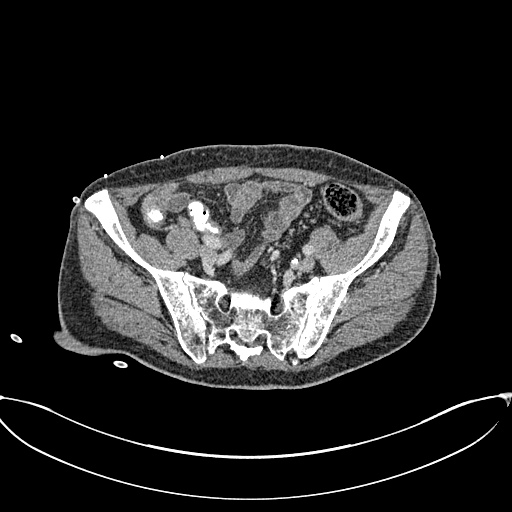
[im 258/619  soft-tissue]
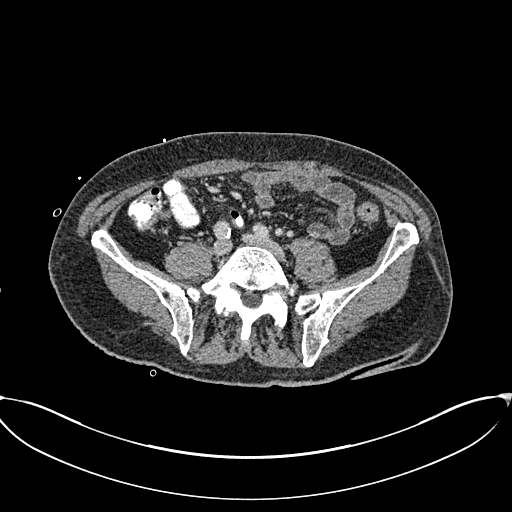
[im 361/619  soft-tissue]
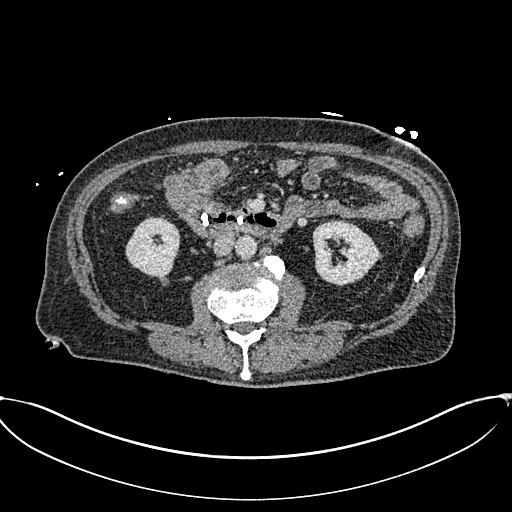
[im 413/619  soft-tissue]
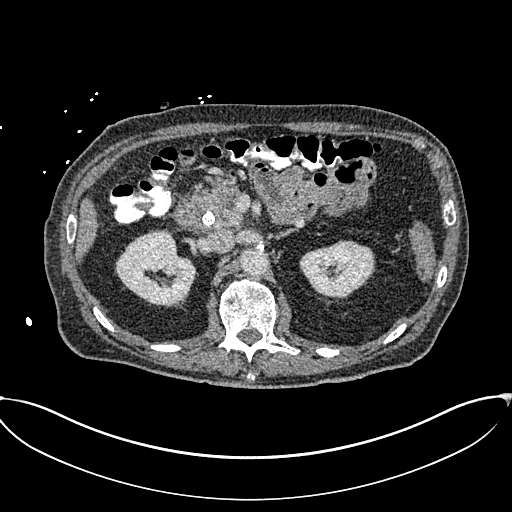
[im 464/619  soft-tissue]
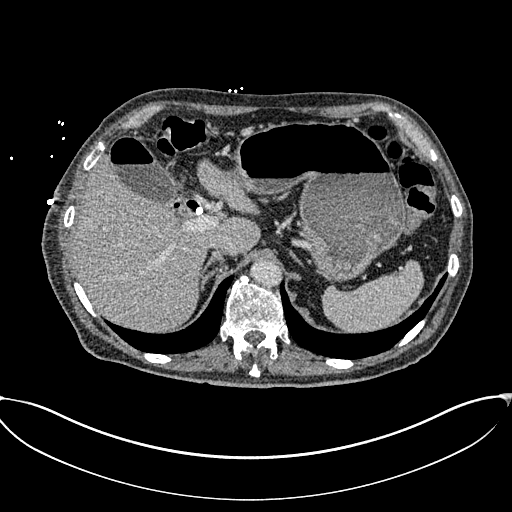
[im 567/619  soft-tissue]
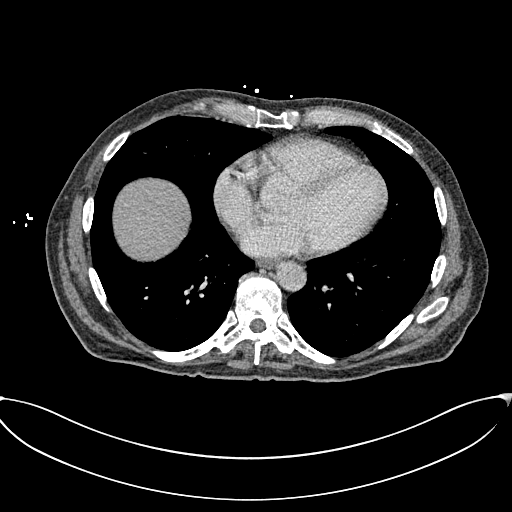

[Series 12: coronal arterial · coronal · arterial · 0.52mm/px · 3 of 83 slices shown, 4 images]
[im 21/83  soft-tissue]
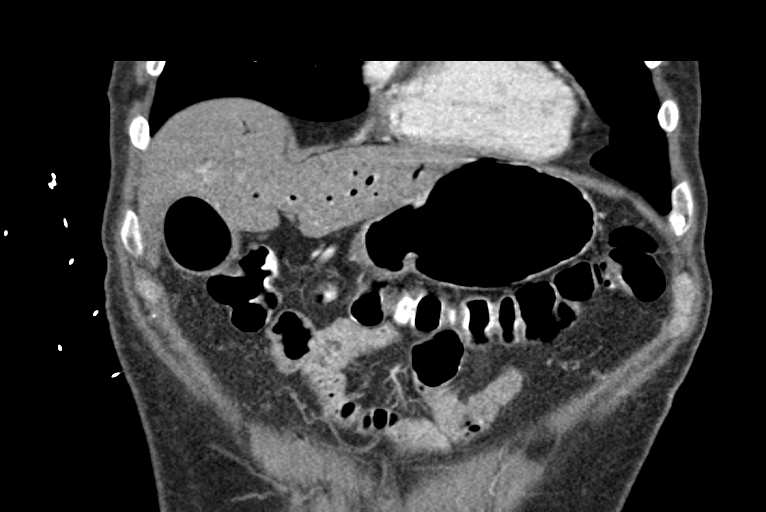
[im 42/83  soft-tissue]
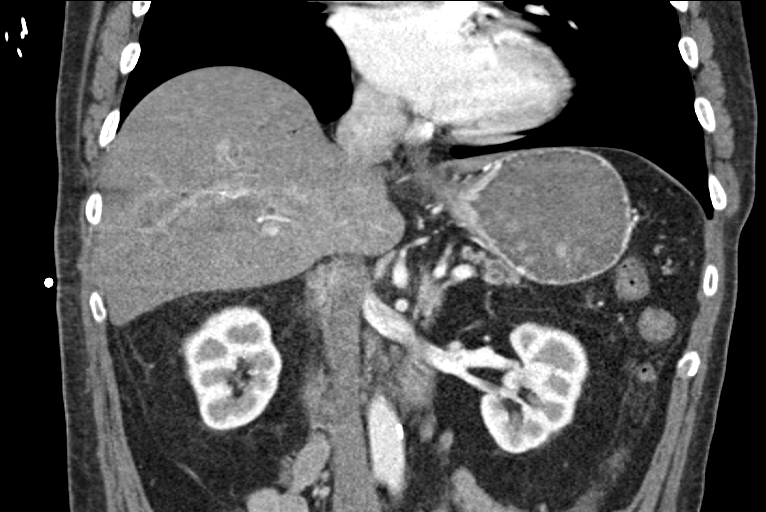
[im 42/83  bone]
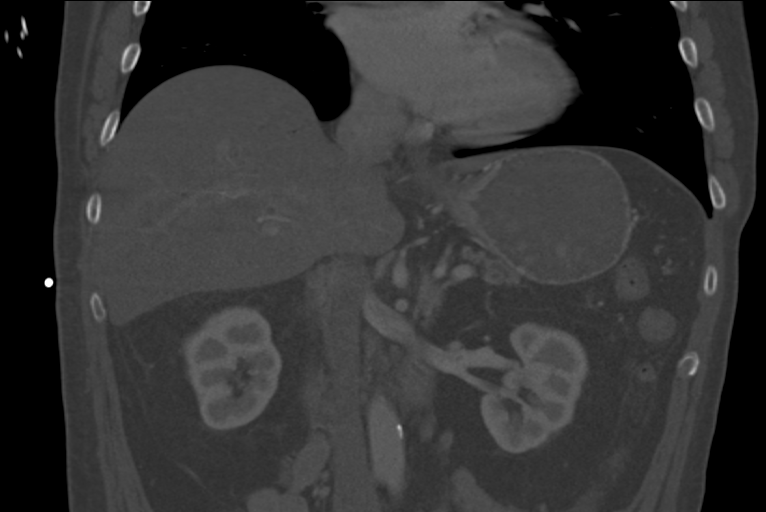
[im 62/83  soft-tissue]
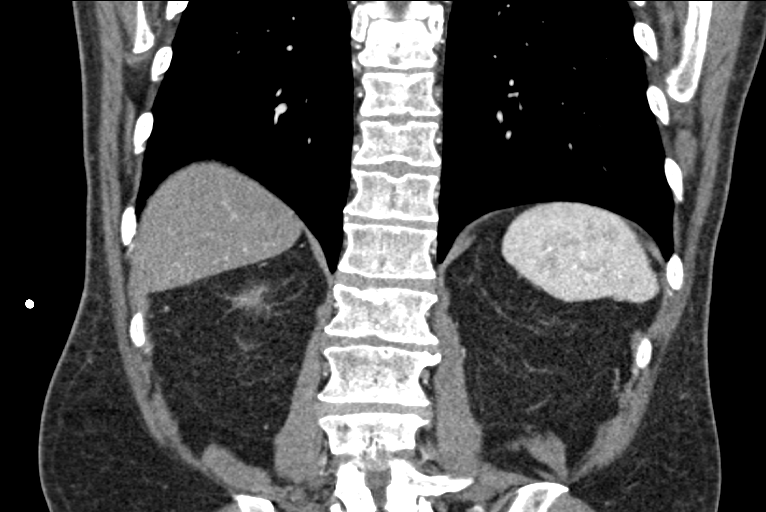

[12 of 46 positions shown; findings below may reference images not displayed]

FINDINGS: Lower chest: No acute abnormality.

Hepatobiliary: No focal liver lesion identified. Pneumobilia is
identified status post percutaneous transhepatic biliary stent
placement and internal bile duct stent placement. Interval
decompression of previous dilated intrahepatic and common bile
ducts.

Pancreas: Again noted is atrophy of the body through tail of
pancreas with diffuse main duct dilatation. Mass within head of
pancreas is somewhat ill-defined measuring approximately 3.5 x
cm, image 60/3. There is no involvement scratch set scratch set

No signs of encasement of the celiac trunk or superior mesenteric
artery. There is partial encasement of the portal venous confluence
(at least 180 degrees), image 32/4. There is also partial encasement
(approximately 180 degrees) of the superior mesenteric vein just
distal to the confluence, image 36/4.

Ventral extension of tumor abuts the posterior wall of the proximal
duodenum, image 32/4. Cannot exclude invasion.

Spleen: Normal in size without focal abnormality.

Adrenals/Urinary Tract: Normal adrenal glands. The kidneys are
unremarkable. No mass or hydronephrosis. Urinary bladder
unremarkable.

Stomach/Bowel: Stomach is nondilated. No bowel wall thickening,
inflammation, or distension.

Vascular/Lymphatic: Aortic atherosclerosis without aneurysm. No
abdominal adenopathy. No pelvic adenopathy.

Reproductive: Prostate is unremarkable.

Other: No abdominal wall hernia or abnormality. No abdominopelvic
ascites.

Musculoskeletal: No acute or significant osseous findings.
IMPRESSION: 1. Mass within head of pancreas is somewhat ill-defined measuring
approximately 3.5 x 2.8 cm. There is partial encasement of the
portal venous confluence and superior mesenteric vein. No signs of
encasement of the celiac trunk or superior mesenteric artery.
2. No findings of nodal metastasis or solid organ metastasis within
the abdomen or pelvis.
3. Status post percutaneous transhepatic biliary stent placement and
internal bile duct stent placement with decompression of previous
dilated intrahepatic and common bile ducts.
4. Aortic atherosclerosis.

Aortic Atherosclerosis ([DA]-[DA]).

## 2020-11-28 IMAGING — XA IR CHOLANGIOGRAM VIA EXIST CATHETER
4 series · 4 of 4 positions shown · non-contrast
Comparison: none

INDICATION: 70-year-old male with a history of persisting jaundice after biliary
stent placement

[Series 1: fl (-) angio · 1 of 1 slices shown (1 of 2)]
[im 1/1]
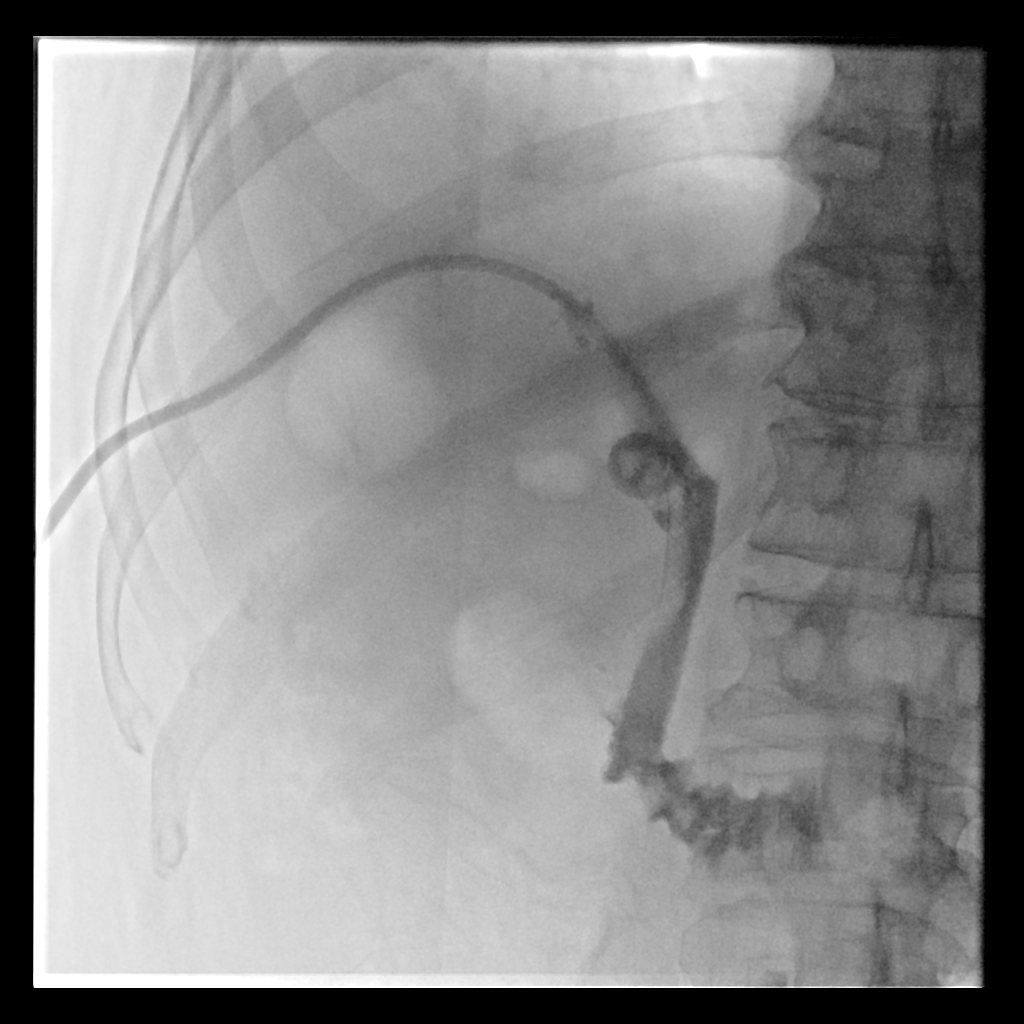

[Series 3: single · 1 of 1 slices shown (1 of 2)]
[im 1/1]
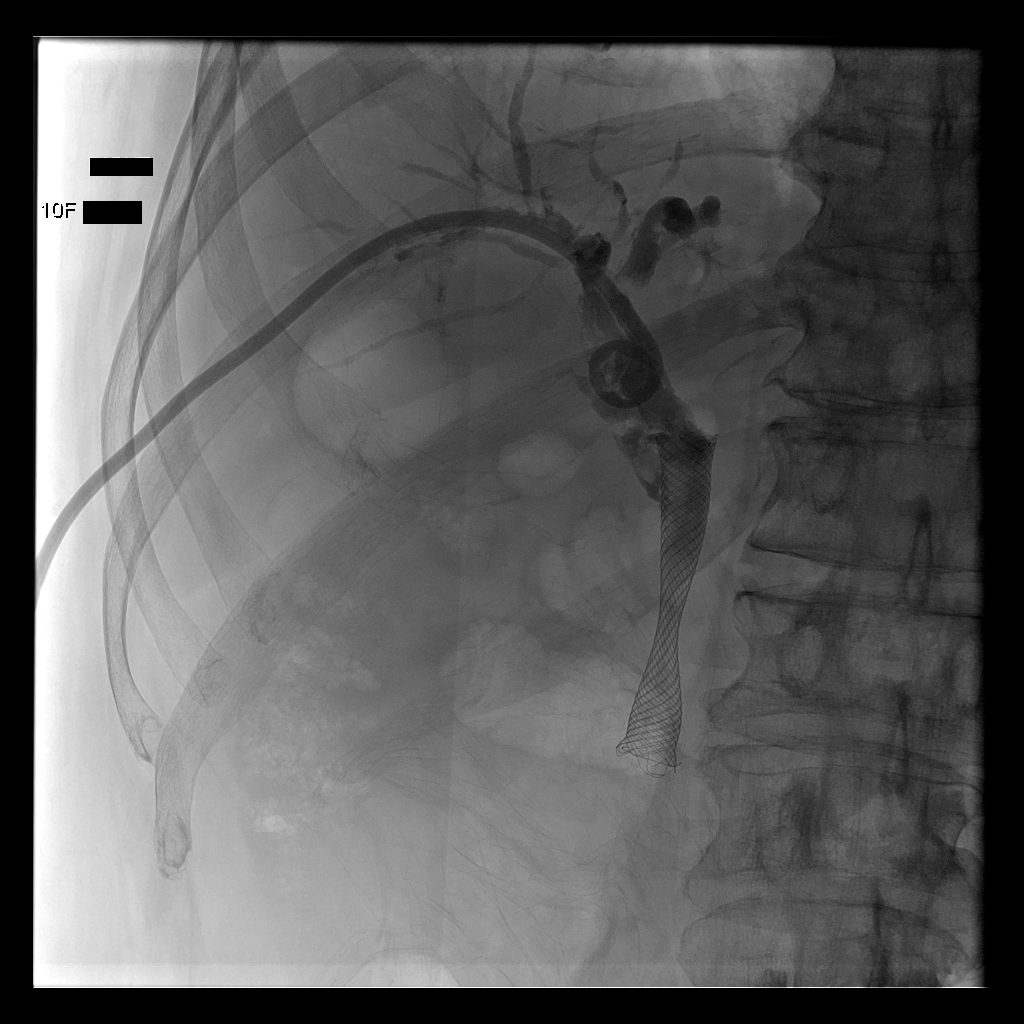

[Series 6: fl (-) angio · 1 of 1 slices shown (2 of 2)]
[im 1/1]
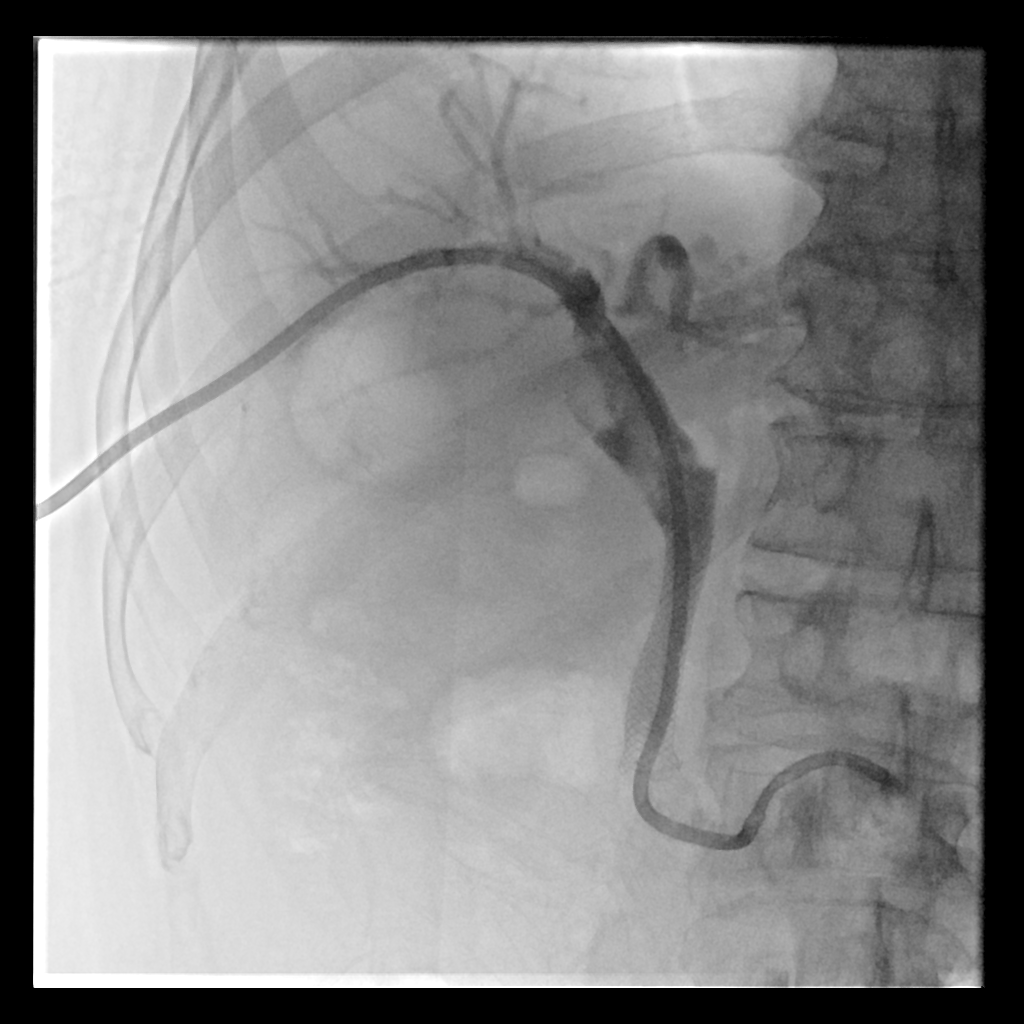

[Series 7: single · 1 of 1 slices shown (2 of 2)]
[im 1/1]
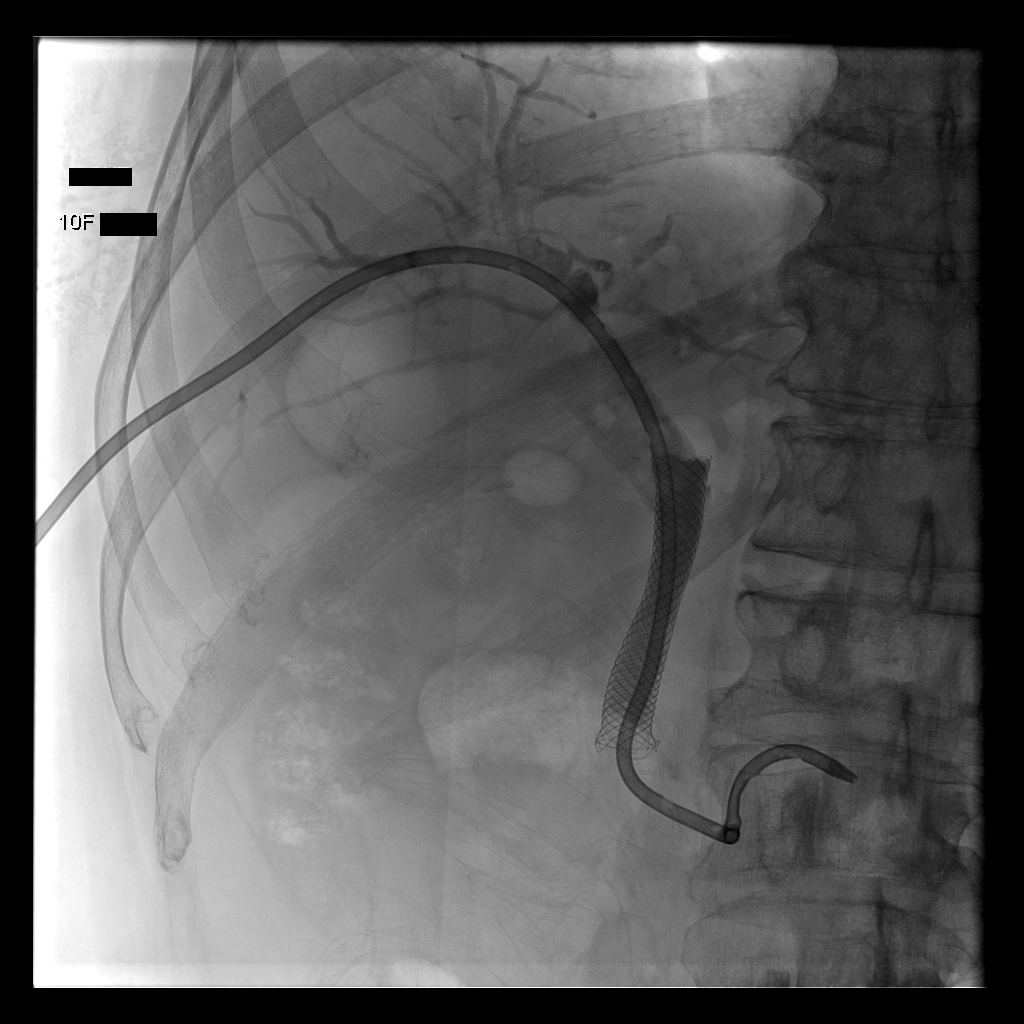

[4 of 4 positions shown; findings below may reference images not displayed]

EXAM:
CHOLANGIOGRAM VIA EXISTING CATHETER

MEDICATIONS:
None

ANESTHESIA/SEDATION:
Moderate (conscious) sedation was employed during this procedure. A
total of Versed 1.0 mg and Fentanyl 75 mcg was administered
intravenously.

Moderate Sedation Time: 16 minutes. The patient's level of
consciousness and vital signs were monitored continuously by
radiology nursing throughout the procedure under my direct
supervision.

FLUOROSCOPY TIME:  Fluoroscopy Time: 1 minutes 42 seconds (13.5
mGy).

COMPLICATIONS:
None

PROCEDURE:
Informed written consent was obtained from the patient after a
thorough discussion of the procedural risks, benefits and
alternatives. All questions were addressed. Maximal Sterile Barrier
Technique was utilized including caps, mask, sterile gowns, sterile
gloves, sterile drape, hand hygiene and skin antiseptic. A timeout
was performed prior to the initiation of the procedure.

Patient positioned supine position under the image intensifier. The
right upper quadrant and the indwelling drain were prepped and
draped in the usual sterile fashion. 1% lidocaine was used for local
anesthesia.

Contrast was injected through the indwelling externalized biliary
drain. The drain was then ligated and removed on a Bentson wire. A
Kumpe the catheter and the Bentson wire were navigated through the
stent into the duodenum. With a distal position of the wire, a new
10 French biliary internal/external drain was placed. Wire was
removed.

Contrast was injected and a final image was stored.

The drain was sutured in position.

Patient tolerated the procedure well and remained hemodynamically
stable throughout.

No complications were encountered and no significant blood loss.
FINDINGS: The initial through the tube cholangiogram demonstrates eccentric
filling defect within the proximal stent, with greater than 50%
luminal diameter narrowing. This may be contributing to the
patient's increasing bilirubin levels and symptoms of jaundice. This
was nonocclusive however and contrast did enter the duodenum on the
first contrast injection.

Subsequently, the internal/external biliary drain was placed and a
final image demonstrate that this filling defect within the proximal
stent had resolved, indicating likely this was secondary to debris
and/or blood products/clot.

Drain was placed to gravity
IMPRESSION: Status post limited cholangiogram and exchange of an externalized
biliary drain for a new internal/external biliary drain.

PLAN:
Case was discussed with the patient's surgery team, and a capping
trial will likely be initiated starting in 24 hours.

## 2020-11-28 MED ORDER — GLUCERNA PO LIQD: 237.0000 mL | Freq: Two times a day (BID) | ORAL | Status: DC

## 2020-11-28 MED ORDER — AMLODIPINE BESYLATE 5 MG PO TABS
5.0000 mg | ORAL_TABLET | Freq: Every day | ORAL | Status: DC
  Administered 2020-11-28 – 2020-11-29 (×2): 5 mg via ORAL
  Filled 2020-11-28 (×2): qty 1

## 2020-11-28 MED ORDER — ENOXAPARIN SODIUM 40 MG/0.4ML IJ SOSY
40.0000 mg | PREFILLED_SYRINGE | INTRAMUSCULAR | Status: DC
  Administered 2020-11-29: 40 mg via SUBCUTANEOUS
  Filled 2020-11-28 (×2): qty 0.4

## 2020-11-28 MED ORDER — IOHEXOL 240 MG/ML SOLN
50.0000 mL | Freq: Once | INTRAMUSCULAR | Status: AC | PRN
  Administered 2020-11-28: 20 mL via INTRAVENOUS

## 2020-11-28 MED ORDER — FENTANYL CITRATE (PF) 100 MCG/2ML IJ SOLN
INTRAMUSCULAR | Status: AC
  Filled 2020-11-28: qty 4

## 2020-11-28 MED ORDER — SODIUM CHLORIDE 0.9 % IV SOLN: INTRAVENOUS | Status: DC

## 2020-11-28 MED ORDER — FENTANYL CITRATE (PF) 100 MCG/2ML IJ SOLN
INTRAMUSCULAR | Status: DC | PRN
  Administered 2020-11-28: 25 ug via INTRAVENOUS

## 2020-11-28 MED ORDER — ONDANSETRON 4 MG PO TBDP: 4.0000 mg | ORAL_TABLET | Freq: Four times a day (QID) | ORAL | Status: DC | PRN

## 2020-11-28 MED ORDER — BASAGLAR KWIKPEN 100 UNIT/ML ~~LOC~~ SOPN
9.0000 [IU] | PEN_INJECTOR | Freq: Every day | SUBCUTANEOUS | Status: DC
Start: 2020-11-28 — End: 2020-11-28

## 2020-11-28 MED ORDER — DIPHENHYDRAMINE HCL 12.5 MG/5ML PO ELIX
12.5000 mg | ORAL_SOLUTION | Freq: Four times a day (QID) | ORAL | Status: DC | PRN
  Filled 2020-11-28: qty 5

## 2020-11-28 MED ORDER — LIDOCAINE HCL 1 % IJ SOLN
INTRAMUSCULAR | Status: AC
  Filled 2020-11-28: qty 20

## 2020-11-28 MED ORDER — ENSURE ENLIVE PO LIQD: 237.0000 mL | Freq: Two times a day (BID) | ORAL | Status: DC

## 2020-11-28 MED ORDER — DIPHENHYDRAMINE HCL 50 MG/ML IJ SOLN: 12.5000 mg | Freq: Four times a day (QID) | INTRAMUSCULAR | Status: DC | PRN

## 2020-11-28 MED ORDER — SODIUM CHLORIDE 0.9% FLUSH
5.0000 mL | Freq: Three times a day (TID) | INTRAVENOUS | Status: DC
  Administered 2020-11-28 – 2020-11-30 (×6): 5 mL

## 2020-11-28 MED ORDER — SERTRALINE HCL 100 MG PO TABS: 100.0000 mg | ORAL_TABLET | Freq: Every day | ORAL | Status: DC

## 2020-11-28 MED ORDER — CHOLESTYRAMINE 4 G PO PACK: 4.0000 g | PACK | Freq: Two times a day (BID) | ORAL | 0 refills | Status: DC

## 2020-11-28 MED ORDER — IOHEXOL 350 MG/ML SOLN
80.0000 mL | Freq: Once | INTRAVENOUS | Status: AC | PRN
  Administered 2020-11-28: 80 mL via INTRAVENOUS

## 2020-11-28 MED ORDER — CHOLESTYRAMINE 4 G PO PACK
4.0000 g | PACK | Freq: Two times a day (BID) | ORAL | Status: DC
  Filled 2020-11-28 (×4): qty 1

## 2020-11-28 MED ORDER — ATENOLOL 50 MG PO TABS
50.0000 mg | ORAL_TABLET | Freq: Every day | ORAL | Status: DC
  Administered 2020-11-28 – 2020-11-29 (×2): 50 mg via ORAL
  Filled 2020-11-28 (×2): qty 1

## 2020-11-28 MED ORDER — ONDANSETRON HCL 4 MG/2ML IJ SOLN: 4.0000 mg | Freq: Four times a day (QID) | INTRAMUSCULAR | Status: DC | PRN

## 2020-11-28 MED ORDER — INSULIN GLARGINE-YFGN 100 UNIT/ML ~~LOC~~ SOLN
9.0000 [IU] | Freq: Every day | SUBCUTANEOUS | Status: DC
  Administered 2020-11-28 – 2020-11-29 (×2): 9 [IU] via SUBCUTANEOUS
  Filled 2020-11-28 (×3): qty 0.09

## 2020-11-28 MED ORDER — FENTANYL CITRATE (PF) 100 MCG/2ML IJ SOLN
INTRAMUSCULAR | Status: DC | PRN
  Administered 2020-11-28: 50 ug via INTRAVENOUS

## 2020-11-28 MED ORDER — ATORVASTATIN CALCIUM 40 MG PO TABS
40.0000 mg | ORAL_TABLET | Freq: Every day | ORAL | Status: DC
  Administered 2020-11-29: 40 mg via ORAL
  Filled 2020-11-28: qty 1

## 2020-11-28 MED ORDER — SODIUM CHLORIDE 0.9 % IJ SOLN: 10.0000 mL | Freq: Every day | INTRAMUSCULAR | 5 refills | Status: DC

## 2020-11-28 MED ORDER — MIDAZOLAM HCL 2 MG/2ML IJ SOLN
INTRAMUSCULAR | Status: DC | PRN
  Administered 2020-11-28: 1 mg via INTRAVENOUS

## 2020-11-28 MED ORDER — TRAMADOL HCL 50 MG PO TABS
50.0000 mg | ORAL_TABLET | Freq: Four times a day (QID) | ORAL | Status: DC | PRN
Start: 2020-11-28 — End: 2020-11-30

## 2020-11-28 MED ORDER — INFLUENZA VAC A&B SA ADJ QUAD 0.5 ML IM PRSY
0.5000 mL | PREFILLED_SYRINGE | INTRAMUSCULAR | Status: AC
  Administered 2020-11-29: 0.5 mL via INTRAMUSCULAR
  Filled 2020-11-28: qty 0.5

## 2020-11-28 MED ORDER — INSULIN ASPART 100 UNIT/ML IJ SOLN
0.0000 [IU] | Freq: Three times a day (TID) | INTRAMUSCULAR | Status: DC
  Administered 2020-11-28: 5 [IU] via SUBCUTANEOUS
  Administered 2020-11-29: 3 [IU] via SUBCUTANEOUS
  Administered 2020-11-29 (×2): 7 [IU] via SUBCUTANEOUS
  Administered 2020-11-30: 9 [IU] via SUBCUTANEOUS
  Administered 2020-11-30: 2 [IU] via SUBCUTANEOUS

## 2020-11-28 MED ORDER — MIDAZOLAM HCL 2 MG/2ML IJ SOLN
INTRAMUSCULAR | Status: AC
  Filled 2020-11-28: qty 4

## 2020-11-28 MED ORDER — ENSURE MAX PROTEIN PO LIQD
11.0000 [oz_av] | Freq: Two times a day (BID) | ORAL | Status: DC
  Administered 2020-11-28 – 2020-11-30 (×4): 11 [oz_av] via ORAL
  Filled 2020-11-28 (×7): qty 330

## 2020-11-28 NOTE — Plan of Care (Signed)

## 2020-11-28 NOTE — Procedures (Signed)
Interventional Radiology Procedure Note  Procedure:  Through the tube cholangiogram, with exchange of external biliary drain for 68F int/ext biliary drain.  .  Complications: None Recommendations:  - To gravity during admission - Will discuss with surgery - Advance diet - Call with any concern for rigors  Signed,  Dulcy Fanny. Earleen Newport, DO

## 2020-11-28 NOTE — Progress Notes (Addendum)
Patient to be admitted to after IR procedure. After procedure report called to Francisco Panning, RN on [place]. Patient transported to room [phone]. All of patient belongings transferred with patient (brown bag and patient belonging bag). Patient assisted from stretcher to bed, call light and room phone given to patient and bed placed in low position. Patient alert/oriented x 4, VSS, biliary drain intact, patient denies pain or discomfort. Patient wife, Vaughan Basta called and informed that procedure complete and unit and room number given to wife. Patient nurse Bre at bedside.

## 2020-11-28 NOTE — H&P (Signed)
Francisco Frank Jul 04, 1950  [id number].     Chief Complaint/Reason for Consult: persistent jaundice   HPI:  Francisco Frank is a 70 yo male with a newly-diagnosed pancreatic cancer.  Earlier this year he had crampy abdominal pain and underwent a colonoscopy, which did not show any malignant lesions. He was then diagnosed with diabetes in June and is now on insulin. Subsequently he developed painless jaundice and a CT at an outside facility reportedly showed a pancreatic mass (we do not have these images). He was then admitted and underwent an MRCP, which showed biliary and pancreatic duct dilation with a mass in the head of the pancreas. He had an EUS/ERCP on 8/23, which showed a uT2N0 mass in the head of the pancreas with concern for invasion of the SMV. FNA confirmed adenocarcinoma. Cannulation of the bile duct was not successful at ERCP so he had a PTC with placement of an internal/external PTBD on 8/24. He then followed up with oncology on 8/30, at which time his bilirubin had increased again to 14. He was referred to IR and on 8/31 underwent percutaneous transhepatic placement of a SEMS. A drainage catheter was left in place in the hepatic ducts above the stent.   Two days ago the patient came to clinic for surgical evaluation and was visibly jaundiced. He reported persistent itching and dark-colored urine. Labs were sent and resulted this morning, and showed a bilirubin of 10 (compared to 14 last week at the time of stent placement). He was referred to IR for a repeat cholangiogram today to assess his biliary stent, which was narrowed but no occluded. An internal/external biliary drain was placed across the stent, and the drain is currently to gravity drainage. The patient endorses continued itching but no fevers or chills.     ROS: Review of Systems  Constitutional:  Negative for chills and fever.  Respiratory:  Negative for shortness of breath, wheezing and stridor.   Cardiovascular:  Negative for  chest pain.  Gastrointestinal:  Positive for abdominal pain. Negative for nausea and vomiting.  Genitourinary:        Dark urine  Skin:  Positive for itching.  Neurological:  Positive for weakness.         Family History  Problem Relation Age of Onset   Cirrhosis Father     Alcohol abuse Father     Colon cancer Maternal Grandmother     Colonic polyp Maternal Grandmother     Heart disease Paternal Grandfather     Esophageal cancer Neg Hx     Pancreatic cancer Neg Hx     Stomach cancer Neg Hx            Past Medical History:  Diagnosis Date   Back pain     Diabetes mellitus without complication (HCC)     Hearing deficit     Hyperlipemia     Hypertension     Insomnia     Sleep apnea             Past Surgical History:  Procedure Laterality Date   BIOPSY   11/11/2020    Procedure: BIOPSY;  Surgeon: Irving Copas., MD;  Location: [facility];  Service: Gastroenterology;;   CARDIAC CATHETERIZATION       ENDOSCOPIC RETROGRADE CHOLANGIOPANCREATOGRAPHY (ERCP) WITH PROPOFOL N/A 11/11/2020    Procedure: ENDOSCOPIC RETROGRADE CHOLANGIOPANCREATOGRAPHY (ERCP) WITH PROPOFOL;  Surgeon: Irving Copas., MD;  Location: [facility];  Service: Gastroenterology;  Laterality: N/A;   ESOPHAGOGASTRODUODENOSCOPY (EGD) WITH  PROPOFOL N/A 11/11/2020    Procedure: ESOPHAGOGASTRODUODENOSCOPY (EGD) WITH PROPOFOL;  Surgeon: Rush Landmark Telford Nab., MD;  Location: [facility];  Service: Gastroenterology;  Laterality: N/A;   EUS N/A 11/11/2020    Procedure: UPPER ENDOSCOPIC ULTRASOUND (EUS) RADIAL;  Surgeon: Irving Copas., MD;  Location: [facility];  Service: Gastroenterology;  Laterality: N/A;   FINE NEEDLE ASPIRATION   11/11/2020    Procedure: FINE NEEDLE ASPIRATION;  Surgeon: Rush Landmark Telford Nab., MD;  Location: [facility];  Service: Gastroenterology;;   HEMOSTASIS CONTROL   11/11/2020    Procedure: HEMOSTASIS CONTROL;  Surgeon: Irving Copas., MD;  Location: [facility];  Service: Gastroenterology;;  epi injection   IR BILIARY STENT(S) EXISTING ACCESS INC DILATION CATH EXCHANGE   11/19/2020   IR PERCUTANEOUS TRANSHEPATIC CHOLANGIOGRAM   11/12/2020   REPLACEMENT TOTAL KNEE       ROTATOR CUFF REPAIR       SPHINCTEROTOMY   11/11/2020    Procedure: SPHINCTEROTOMY;  Surgeon: Mansouraty, Telford Nab., MD;  Location: [facility];  Service: Gastroenterology;;      Social History:  reports that he has never smoked. His smokeless tobacco use includes snuff. He reports current alcohol use. He reports that he does not use drugs.   Allergies:      Allergies  Allergen Reactions   Norco [Hydrocodone-Acetaminophen] Itching      No medications prior to admission.        Physical Exam: There were no vitals taken for this visit. General: resting comfortably, appears stated age, no apparent distress Neurological: alert and oriented, no focal deficits, cranial nerves grossly in tact HEENT: normocephalic, atraumatic, oropharynx clear, icteric sclera CV: regular rate and rhythm, extremities warm and well-perfused Respiratory: normal work of breathing on room air, symmetric chest wall expansion Abdomen: soft, nondistended, nontender to deep palpation. No masses or organomegaly. RUQ biliary drain to gravity with bile in bag. Extremities: warm and well-perfused, no deformities, moving all extremities spontaneously Psychiatric: normal mood and affect Skin: warm and dry, jaundiced       Assessment/Plan 69 yo male with persistent jaundice secondary to adenocarcinoma of the pancreatic head. Stent likely occluded by sludge/debris. S/p IR cholangiogram with placement of an internal/external biliary drain. - Carb counting diet - Home insulin regimen (currently on 9 units basal insulin qhs with sliding scale) - Cholestyramine and prn benadryl for cholestatic pruritis - Trend LFTs - Defer antibiotics for now, start zosyn if patient has any signs/symptoms of  cholangitis (fevers, leukocytosis) - Keep biliary drain to gravity today. If Tbili is downtrending tomorrow morning will cap drain, and if patient tolerates 24 hours of capping with no fevers and continued downtrending of LFTs, plan for discharge home Sunday. - VTE: SCDs, lovenox - Dispo: admit to observation  Michaelle Birks, [facility], Hepatobiliary and Pancreatic Surgery 11/28/20 1:00 PM

## 2020-11-28 NOTE — Progress Notes (Signed)
Initial Nutrition Assessment  DOCUMENTATION CODES:   Severe malnutrition in context of chronic illness  INTERVENTION:   - Ensure Max po BID, each supplement provides 150 kcal and 30 grams of protein  - Double protein portions TID with meals  - Encourage continued adequate PO intake  - Reinforced high-calorie, high-protein diet education provided by [place] RD  NUTRITION DIAGNOSIS:   Moderate Malnutrition related to chronic illness, cancer and cancer related treatments (pancreatic cancer) as evidenced by moderate fat depletion, moderate muscle depletion, percent weight loss (22.3% weight loss in 4 months).  GOAL:   Patient will meet greater than or equal to 90% of their needs  MONITOR:   PO intake, Supplement acceptance, Labs, Weight trends, I & O's  REASON FOR ASSESSMENT:   Malnutrition Screening Tool    ASSESSMENT:   70 year old male who presented on 9/09 with persistent jaundice for IR assessment of his biliary stent. PMH of newly-diagnosed pancreatic cancer, DM, HLD, HTN.  9/09 - s/p IR cholangiogram with placement of an internal/external biliary drain  Spoke with pt at bedside. Pt reports just recently (on 9/07) having a visit with [place] RD. During this visit, pt reports discussing ways to increase kcal and protein as well as RD's recommendations for 3 small meals and 3 snacks daily. Pt able to recall information discussed with RD and specifically the importance of liberalizing his diet to allow for more food options.  Pt currently on a Carb Modified diet with 100% meal completion of lunch meal today. Pt reports good appetite PTA (3 meals daily with no snacks) and good appetite currently. Pt would like to receive Ensure Max supplements during admission as these are what he consumes at home. Pt consumes 1-2 Ensure Max supplements daily at home and is willing to consume 2 daily while admitted.  Pt endorses a 50 lb weight loss that began at the beginning of  May. At this time, pt reports weighting 194 lbs. Pt reports that his current weight is 150 lbs. Pt states that his UBW is 185-190 lbs. Reviewed weight history in chart. Pt with a 19.5 kg weight loss since 08/06/20. This is a 22.3% weight loss in 4 months which is severe and significant for timeframe. Pt meets criteria for moderate malnutrition based on NFPE. Pt with 1 area of severe fat wasting and 1 area of severe muscle wasting and is at high risk for developing severe malnutrition.  Medications reviewed and include: questran 4 grams BID, SSI, semglee 9 units daily  Labs reviewed: sodium 133, elevated LFTs CBG's: 225  NUTRITION - FOCUSED PHYSICAL EXAM:  Flowsheet Row Most Recent Value  Orbital Region Severe depletion  Upper Arm Region Moderate depletion  Thoracic and Lumbar Region Moderate depletion  Buccal Region Moderate depletion  Temple Region Moderate depletion  Clavicle Bone Region Moderate depletion  Clavicle and Acromion Bone Region Moderate depletion  Scapular Bone Region Moderate depletion  Dorsal Hand Moderate depletion  Patellar Region Moderate depletion  Anterior Thigh Region Severe depletion  Posterior Calf Region Moderate depletion  Edema (RD Assessment) None  Hair Reviewed  Eyes Reviewed  Mouth Reviewed  Skin Reviewed  [jaundiced]  Nails Reviewed       Diet Order:   Diet Order             Diet Carb Modified Fluid consistency: Thin; Room service appropriate? Yes  Diet effective now                   EDUCATION  NEEDS:   Education needs have been addressed  Skin:  Skin Assessment: Reviewed RN Assessment (jaundice)  Last BM:  no documented BM  Height:   Ht Readings from Last 1 Encounters:  11/28/20 '5\' 8"'$  (1.727 m)    Weight:   Wt Readings from Last 1 Encounters:  11/28/20 68 kg    BMI:  Body mass index is 22.81 kg/m.  Estimated Nutritional Needs:   Kcal:  2000-2200  Protein:  100-115 grams  Fluid:  >/= 2.0 L    Gustavus Bryant, MS,  RD, LDN Inpatient Clinical Dietitian Please see [facility] for contact information.

## 2020-11-28 NOTE — H&P (Signed)
Chief Complaint: Patient was seen in consultation today for cholangiogram.   Referring Physician(s): Allen,Shelby L  Supervising Physician: Corrie Mckusick  Patient Status: [facility] - Out[facility]  History of Present Illness: Francisco Frank is a 70 y.o. male with a medical history significant for DM, HTN, sleep apnea, insomnia and malignant obstructed jaundice secondary to pancreatic cancer. He is familiar to IR from percutaneous internal/external biliary catheter placed 11/12/20 and biliary stent placement 11/19/20. Recent labs show increase bilirubin levels and the patient presents today for a cholangiogram with possible intervention and plans for hospital admission by the surgical team afterwards.   Past Medical History:  Diagnosis Date   Back pain    Diabetes mellitus without complication ([place])    Hearing deficit    Hyperlipemia    Hypertension    Insomnia    Sleep apnea     Past Surgical History:  Procedure Laterality Date   BIOPSY  11/11/2020   Procedure: BIOPSY;  Surgeon: Rush Landmark Telford Nab., MD;  Location: [facility];  Service: Gastroenterology;;   CARDIAC CATHETERIZATION     ENDOSCOPIC RETROGRADE CHOLANGIOPANCREATOGRAPHY (ERCP) WITH PROPOFOL N/A 11/11/2020   Procedure: ENDOSCOPIC RETROGRADE CHOLANGIOPANCREATOGRAPHY (ERCP) WITH PROPOFOL;  Surgeon: Irving Copas., MD;  Location: [facility];  Service: Gastroenterology;  Laterality: N/A;   ESOPHAGOGASTRODUODENOSCOPY (EGD) WITH PROPOFOL N/A 11/11/2020   Procedure: ESOPHAGOGASTRODUODENOSCOPY (EGD) WITH PROPOFOL;  Surgeon: Rush Landmark Telford Nab., MD;  Location: [facility];  Service: Gastroenterology;  Laterality: N/A;   EUS N/A 11/11/2020   Procedure: UPPER ENDOSCOPIC ULTRASOUND (EUS) RADIAL;  Surgeon: Irving Copas., MD;  Location: [facility];  Service: Gastroenterology;  Laterality: N/A;   FINE NEEDLE ASPIRATION  11/11/2020   Procedure: FINE NEEDLE ASPIRATION;  Surgeon: Rush Landmark Telford Nab., MD;   Location: [facility];  Service: Gastroenterology;;   HEMOSTASIS CONTROL  11/11/2020   Procedure: HEMOSTASIS CONTROL;  Surgeon: Irving Copas., MD;  Location: [facility];  Service: Gastroenterology;;  epi injection   IR BILIARY STENT(S) EXISTING ACCESS INC DILATION CATH EXCHANGE  11/19/2020   IR PERCUTANEOUS TRANSHEPATIC CHOLANGIOGRAM  11/12/2020   REPLACEMENT TOTAL KNEE     ROTATOR CUFF REPAIR     SPHINCTEROTOMY  11/11/2020   Procedure: SPHINCTEROTOMY;  Surgeon: Irving Copas., MD;  Location: [facility];  Service: Gastroenterology;;    Allergies: Norco [hydrocodone-acetaminophen]  Medications: Prior to Admission medications   Medication Sig Start Date End Date Taking? Authorizing Provider  ALPRAZolam (XANAX) 0.25 MG tablet Take 1 tablet (0.25 mg total) by mouth at bedtime as needed for up to 5 doses for anxiety. 11/13/20   Antonieta Pert, MD  amLODipine (NORVASC) 5 MG tablet Take 5 mg by mouth at bedtime. 10/22/20   [provider]  atenolol (TENORMIN) 50 MG tablet Take 50 mg by mouth at bedtime.    [provider]  atorvastatin (LIPITOR) 40 MG tablet Take 40 mg by mouth daily. 11/16/20   [provider]  BD PEN NEEDLE NANO 2ND GEN 32G X 4 MM MISC  11/17/20   [provider]  clopidogrel (PLAVIX) 75 MG tablet Take 1 tablet (75 mg total) by mouth at bedtime. Patient not taking: Reported on 11/18/2020 11/14/20   Antonieta Pert, MD  dicyclomine (BENTYL) 10 MG capsule Take 1 capsule (10 mg total) by mouth as needed for spasms. 10 mg po q 6 hours prn Patient not taking: Reported on 11/18/2020 08/06/20   Cirigliano, Vito V, DO  diphenhydramine-acetaminophen (TYLENOL PM) 25-500 MG TABS tablet Take 1 tablet by mouth at  bedtime as needed. 11/26/20   Ladell Pier, MD  Insulin Glargine [facility] KWIKPEN) 100 UNIT/ML Inject 6 Units into the skin at bedtime. 09/27/20   [provider]  NOVOLOG FLEXPEN 100 UNIT/ML FlexPen Inject 10 Units into the skin 3  (three) times daily as needed for high blood sugar. If BS is 150-200=2 units, 201-250=4 units, 251-300=6 units, 301-350=8 units, 351-400=10 units. 09/18/20   [provider]  sertraline (ZOLOFT) 100 MG tablet Take 1 tablet (100 mg total) by mouth daily. 11/14/20 12/14/20  Antonieta Pert, MD  Sodium Chloride Flush (SALINE FLUSH) 0.9 % SOLN Inject 10 mLs into the vein daily. Flush drain with 5-10 mL 1-2 times daily. 11/13/20   Docia Barrier, PA  traMADol (ULTRAM) 50 MG tablet Take 50 mg by mouth every 6 (six) hours as needed for moderate pain or severe pain. 06/11/20   [provider]     Family History  Problem Relation Age of Onset   Cirrhosis Father    Alcohol abuse Father    Colon cancer Maternal Grandmother    Colonic polyp Maternal Grandmother    Heart disease Paternal Grandfather    Esophageal cancer Neg Hx    Pancreatic cancer Neg Hx    Stomach cancer Neg Hx     Social History   Socioeconomic History   Marital status: Married    Spouse name: Not on file   Number of children: Not on file   Years of education: Not on file   Highest education level: Not on file  Occupational History   Not on file  Tobacco Use   Smoking status: Never   Smokeless tobacco: Current    Types: Snuff    Last attempt to quit: 2018  Vaping Use   Vaping Use: Never used  Substance and Sexual Activity   Alcohol use: Yes    Comment: rare   Drug use: Never   Sexual activity: Not Currently  Other Topics Concern   Not on file  Social History Narrative   Not on file   Social Determinants of Health   Financial Resource Strain: Not on file  Food Insecurity: Not on file  Transportation Needs: Not on file  Physical Activity: Not on file  Stress: Not on file  Social Connections: Not on file    Review of Systems: A 12 point ROS discussed and pertinent positives are indicated in the HPI above.  All other systems are negative.  Review of Systems  Constitutional:  Positive for  fatigue. Negative for appetite change.  Respiratory:  Negative for shortness of breath.   Cardiovascular:  Negative for chest pain and leg swelling.  Gastrointestinal:  Negative for abdominal pain, diarrhea, nausea and vomiting.  Neurological:  Negative for headaches.   Vital Signs: BP 112/75   Pulse (!) 58   Temp 98 F (36.7 C) (Oral)   Resp 15   Ht '5\' 8"'$  (1.727 m)   Wt 150 lb (68 kg)   SpO2 100%   BMI 22.81 kg/m   Physical Exam Constitutional:      General: He is not in acute distress.    Appearance: He is ill-appearing.  HENT:     Mouth/Throat:     Mouth: Mucous membranes are moist.     Pharynx: Oropharynx is clear.  Eyes:     General: Scleral icterus present.  Cardiovascular:     Rate and Rhythm: Normal rate and regular rhythm.     Pulses: Normal pulses.  Heart sounds: Normal heart sounds.  Pulmonary:     Effort: Pulmonary effort is normal.     Breath sounds: Normal breath sounds.  Abdominal:     General: Bowel sounds are normal.     Palpations: Abdomen is soft.     Comments: RUQ drain - capped. Site is unremarkable.   Musculoskeletal:        General: Normal range of motion.     Right lower leg: No edema.     Left lower leg: No edema.  Skin:    General: Skin is warm and dry.     Coloration: Skin is jaundiced.  Neurological:     Mental Status: He is alert and oriented to person, place, and time.    Imaging: CT CHEST W CONTRAST  Result Date: 11/13/2020 CLINICAL DATA:  Pancreatic cancer EXAM: CT CHEST WITH CONTRAST TECHNIQUE: Multidetector CT imaging of the chest was performed during intravenous contrast administration. CONTRAST:  11m OMNIPAQUE IOHEXOL 350 MG/ML SOLN COMPARISON:  11/06/2020 FINDINGS: Cardiovascular: The heart and great vessels are unremarkable without pericardial effusion. No evidence of thoracic aortic aneurysm or dissection. Mild atherosclerosis of the aorta and coronary vasculature. No evidence of pulmonary embolus. Mediastinum/Nodes: No  enlarged mediastinal, hilar, or axillary lymph nodes. Thyroid gland, trachea, and esophagus demonstrate no significant findings. Lungs/Pleura: Scattered areas of scarring are seen within the upper lobes. Minimal hypoventilatory change at the left lung base. No acute airspace disease, effusion, or pneumothorax. No pulmonary nodules or masses. The central airways are widely patent. Upper Abdomen: Percutaneous trans hepatic biliary drain is identified. There is mild gallbladder wall thickening, with intraluminal high attenuation possibly representing contrast introduced during biliary procedure. Pancreatic duct dilation again noted. The pancreatic head mass seen on previous MRI is not included due to slice selection. Musculoskeletal: No acute or destructive bony lesions. Reconstructed images demonstrate no additional findings. IMPRESSION: 1. No acute intrathoracic metastases. 2. Percutaneous trans hepatic biliary drain. 3. Nonspecific gallbladder wall thickening. 4. Pancreatic duct dilation. The pancreatic head mass seen on previous MRI is not included due to slice selection. 5.  Aortic Atherosclerosis (ICD10-I70.0). Electronically Signed   By: MRanda NgoM.D.   On: 11/13/2020 15:17   CT PELVIS W CONTRAST  Result Date: 11/13/2020 CLINICAL DATA:  Pancreatic cancer, staging EXAM: CT PELVIS WITH CONTRAST TECHNIQUE: Multidetector CT imaging of the pelvis was performed using the standard protocol following the bolus administration of intravenous contrast. CONTRAST:  852mOMNIPAQUE IOHEXOL 350 MG/ML SOLN COMPARISON:  11/06/2020 FINDINGS: Urinary Tract: Distal ureters and bladder are unremarkable. No urinary tract calculi. Bowel: There is circumferential mural thickening of the cecum, measuring up to 12 mm, nonspecific. No other areas of bowel wall thickening or inflammatory change. No bowel obstruction or ileus. Normal caliber appendix right lower quadrant. Vascular/Lymphatic: Atherosclerosis the identified within  the distal aorta and its branches. No pathologic adenopathy visualized within the pelvis. Reproductive:  Prostate is unremarkable. Other:  No free fluid or free gas.  No abdominal wall hernia. Musculoskeletal: No acute or destructive bony lesions. There is severe spondylosis partially visualized at L3-4. Reconstructed images demonstrate no additional findings. IMPRESSION: 1. Nonspecific circumferential wall thickening of the cecum, measuring up to 12 mm in thickness. Correlation with colonoscopy may be useful to exclude underlying neoplasm. No surrounding inflammatory changes. 2. Otherwise no evidence of intrapelvic metastases. 3.  Aortic Atherosclerosis (ICD10-I70.0). Electronically Signed   By: MiRanda Ngo.D.   On: 11/13/2020 15:20   MR Abdomen W Wo Contrast  Result Date:  11/06/2020 CLINICAL DATA:  Weight loss. Outside CT demonstrating a pancreatic abnormality. Epigastric discomfort. EXAM: MRI ABDOMEN WITHOUT AND WITH CONTRAST (INCLUDING MRCP) TECHNIQUE: Multiplanar multisequence MR imaging of the abdomen was performed both before and after the administration of intravenous contrast. Heavily T2-weighted images of the biliary and pancreatic ducts were obtained, and three-dimensional MRCP images were rendered by post processing. CONTRAST:  84m GADAVIST GADOBUTROL 1 MMOL/ML IV SOLN COMPARISON:  None. FINDINGS: Lower chest: Normal heart size without pericardial or pleural effusion. Hepatobiliary: No suspicious liver lesion. The gallbladder is mildly distended 10.3 cm with small gallstones within. No evidence of acute cholecystitis. Moderate intra and extrahepatic biliary duct dilatation. Common duct 1.7 cm on 16/8. Followed to the level of the pancreatic head. Pancreas: Pancreatic body and tail atrophy with duct dilatation including up to 9 mm on 20/5. Both ducts undergo an abrupt transition in the region of the pancreatic head. A non border deforming pancreatic head mass is subtly apparent at 3.2 x 2.6 cm on  52/23. No superimposed acute pancreatitis. Spleen:  Normal in size, without focal abnormality. Adrenals/Urinary Tract: Normal adrenal glands. Tiny upper pole left renal lesion is likely a cyst. Normal right kidney. No hydronephrosis. Stomach/Bowel: Normal stomach, without wall thickening. Normal abdominal bowel loops. Vascular/Lymphatic: Aortic atherosclerosis. No arterial involvement by tumor. The SMV is adjacent to presumed tumor without encasement, including on 51/23. Portal vein uninvolved. No abdominal adenopathy. A left periaortic 7 mm node on 29/5 is not pathologic by size criteria. Other: No ascites. Subtle left-sided pericolonic nodule of 5 mm on 36/23. Musculoskeletal: No acute osseous abnormality. IMPRESSION: 1. 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 2. No evidence of hepatic metastasis or vascular encasement. The SMV likely contacts tumor over a significantly less than 180 degree span. 3. Minimal left-sided pericolonic nodularity is felt unlikely to represent peritoneal isolated metastasis. Possibly the sequelae of colonic diverticulosis or even a reactive node. Recommend attention on follow-up. 4. Cholelithiasis. Electronically Signed   By: KAbigail MiyamotoM.D.   On: 11/06/2020 13:49   MR 3D Recon At Scanner  Result Date: 11/06/2020 CLINICAL DATA:  Weight loss. Outside CT demonstrating a pancreatic abnormality. Epigastric discomfort. EXAM: MRI ABDOMEN WITHOUT AND WITH CONTRAST (INCLUDING MRCP) TECHNIQUE: Multiplanar multisequence MR imaging of the abdomen was performed both before and after the administration of intravenous contrast. Heavily T2-weighted images of the biliary and pancreatic ducts were obtained, and three-dimensional MRCP images were rendered by post processing. CONTRAST:  753mGADAVIST GADOBUTROL 1 MMOL/ML IV SOLN COMPARISON:  None. FINDINGS: Lower chest:  Normal heart size without pericardial or pleural effusion. Hepatobiliary: No suspicious liver lesion. The gallbladder is mildly distended 10.3 cm with small gallstones within. No evidence of acute cholecystitis. Moderate intra and extrahepatic biliary duct dilatation. Common duct 1.7 cm on 16/8. Followed to the level of the pancreatic head. Pancreas: Pancreatic body and tail atrophy with duct dilatation including up to 9 mm on 20/5. Both ducts undergo an abrupt transition in the region of the pancreatic head. A non border deforming pancreatic head mass is subtly apparent at 3.2 x 2.6 cm on 52/23. No superimposed acute pancreatitis. Spleen:  Normal in size, without focal abnormality. Adrenals/Urinary Tract: Normal adrenal glands. Tiny upper pole left renal lesion is likely a cyst. Normal right kidney. No hydronephrosis. Stomach/Bowel: Normal stomach, without wall thickening. Normal abdominal bowel loops. Vascular/Lymphatic: Aortic atherosclerosis. No arterial involvement by tumor.  The SMV is adjacent to presumed tumor without encasement, including on 51/23. Portal vein uninvolved. No abdominal adenopathy. A left periaortic 7 mm node on 29/5 is not pathologic by size criteria. Other: No ascites. Subtle left-sided pericolonic nodule of 5 mm on 36/23. Musculoskeletal: No acute osseous abnormality. IMPRESSION: 1. 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 2. No evidence of hepatic metastasis or vascular encasement. The SMV likely contacts tumor over a significantly less than 180 degree span. 3. Minimal left-sided pericolonic nodularity is felt unlikely to represent peritoneal isolated metastasis. Possibly the sequelae of colonic diverticulosis or even a reactive node. Recommend attention on follow-up. 4. Cholelithiasis. Electronically Signed   By: Abigail Miyamoto M.D.   On: 11/06/2020 13:49   DG  ERCP BILIARY & PANCREATIC DUCTS  Result Date: 11/11/2020 CLINICAL DATA:  ERCP. EXAM: ERCP TECHNIQUE: Multiple spot images obtained with the fluoroscopic device and submitted for interpretation post-procedure. FLUOROSCOPY TIME:  Fluoroscopy Time:  6 minutes and 2 seconds. Radiation Exposure Index (if provided by the fluoroscopic device): None provided Number of Acquired Spot Images: 6 COMPARISON:  MR abdomen, 11/06/2020. FINDINGS: Multiple, limited oblique planar images of the RIGHT upper quadrant demonstrating endoscopy and common bile duct cannulation. IMPRESSION: Fluoroscopic imaging for ERCP. Please see performing service dictation for complete description of intraprocedural findings. Electronically Signed   By: Michaelle Birks M.D.   On: 11/11/2020 16:19   IR PERCUTANEOUS TRANSHEPATIC CHOLANGIOGRAM  Result Date: 11/12/2020 INDICATION: 70 year old with obstructive jaundice and a pancreatic head mass. Patient needs biliary decompression. EXAM: 1. Percutaneous transhepatic cholangiogram 2. Placement of internal/external biliary drain using ultrasound and fluoroscopic guidance MEDICATIONS: Cefoxitin 2 g; The antibiotic was administered within an appropriate time frame prior to the initiation of the procedure. ANESTHESIA/SEDATION: Moderate (conscious) sedation was employed during this procedure. A total of Versed 4.0 mg and Fentanyl 100 mcg was administered intravenously. Moderate Sedation Time: 20 minutes. The patient's level of consciousness and vital signs were monitored continuously by radiology nursing throughout the procedure under my direct supervision. FLUOROSCOPY TIME:  Fluoroscopy Time: 3 minutes, 40 mGy COMPLICATIONS: None immediate. PROCEDURE: Informed written consent was obtained from the patient after a thorough discussion of the procedural risks, benefits and alternatives. All questions were addressed.A timeout was performed prior to the initiation of the procedure. The anterior and right side of  the abdomen was prepped and draped in sterile fashion. Maximal barrier sterile technique was utilized including caps, mask, sterile gowns, sterile gloves, sterile drape, hand hygiene and skin antiseptic. Ultrasound demonstrated severe intrahepatic biliary dilatation. A dilated peripheral bile duct in the right hepatic lobe was targeted. The skin was anesthetized using 1% lidocaine. Using ultrasound guidance, 21 gauge needle was directed into a dilated bile duct. Bile was draining from the needle. Contrast injection confirmed placement in the biliary system. A 0.018 wire was easily advanced into the biliary system and a transitional dilator set was placed. Bentson wire was placed. Kumpe catheter was advanced over the wire. A Kumpe catheter was advanced into the common bile duct and manipulated into the duodenum using the Bentson wire. The Kumpe catheter was removed and the tract was dilated over the wire to accommodate a 10 [place] biliary drain. Biliary drain was advanced over the wire and into the duodenum. Pigtail was coiled within the duodenum. Large volume of bile was aspirated. Samples sent for culture and cytology. Contrast injection was performed. Catheter was  attached to a gravity bag. Catheter was sutured to skin and a sterile dressing was placed. FINDINGS: Severe intrahepatic biliary dilatation. A peripheral right hepatic bile duct was successfully cannulated. Cholangiogram demonstrated an obstruction in the distal common bile duct. Catheter and wire were advanced beyond the obstruction and the drain tip was positioned in the duodenum. IMPRESSION: Percutaneous transhepatic cholangiogram demonstrating a distal common bile duct obstruction. Successful placement of an internal/external biliary drain. Drain tip is in the duodenum. Electronically Signed   By: Markus Daft M.D.   On: 11/12/2020 14:14   IR BILIARY STENT(S) EXISTING ACCESS INC DILATION CATH EXCHANGE  Result Date: 11/19/2020 INDICATION:  70 year old male with malignant obstructed jaundice secondary to pancreatic cancer. Percutaneous internal/external biliary drainage catheter was placed on 11/12/2020 after an unsuccessful attempted ERCP on 11/11/2020. Patient remains jaundiced and presents today for transhepatic biliary stent placement. EXAM: 1. Biliary stent placement through existing access 2. Biliary drain exchange MEDICATIONS: 2 g Rocephin; The antibiotic was administered within an appropriate time frame prior to the initiation of the procedure. ANESTHESIA/SEDATION: Moderate (conscious) sedation was employed during this procedure. A total of Versed 2 mg and Fentanyl 100 mcg was administered intravenously. Moderate Sedation Time: 15 minutes. The patient's level of consciousness and vital signs were monitored continuously by radiology nursing throughout the procedure under my direct supervision. FLUOROSCOPY TIME:  Fluoroscopy Time: 3 minutes 18 seconds (42 mGy). COMPLICATIONS: None immediate. PROCEDURE: Informed written consent was obtained from the patient after a thorough discussion of the procedural risks, benefits and alternatives. All questions were addressed. Maximal Sterile Barrier Technique was utilized including caps, mask, sterile gowns, sterile gloves, sterile drape, hand hygiene and skin antiseptic. A timeout was performed prior to the initiation of the procedure. Local anesthesia was attained by infiltration with 1% lidocaine. The retention suture on the existing internal/external biliary drainage catheter was cut. The drainage catheter was transected and removed over a Bentson wire. A 5 French angled catheter was advanced over the wire and into the duodenum. The catheter and wire combination were advanced into the proximal jejunum. The Bentson wire was exchanged for an Amplatz wire. The 5 French catheter was exchanged for a 9 [place] sheath which was advanced into the duodenum. A pull-back cholangiogram was then performed confirming  persistent occlusion of the mid and distal common bile duct. The occlusion length measures approximately 5.8 cm. Therefore, a 10 mm x 6 cm wall flex covered biliary stent was selected. The sheath was readvanced over the wire into the duodenum. The stent was positioned across the stenosis before being unsheathed. The stent was then successfully deployed. The Amplatz wire was brought back and capped in the common hepatic duct. A new 10.2 French Dawson Mueller drainage catheter was advanced over the wire and formed in the common hepatic duct. The catheter was secured to the skin with 0 Prolene suture. Sterile bandages were applied. IMPRESSION: 1. Successful placement of a self expanding covered biliary stent measuring 10 x 60 mm. 2. Biliary drain exchange with a 10.2 French Dawson Mueller catheter left in the common hepatic duct above the biliary stent. The tube was left capped to initiate a physiologic trial. PLAN: Return Interventional Radiology in 2 weeks for serum bilirubin evaluation, cholangiogram and possible drain removal. Electronically Signed   By: Jacqulynn Cadet M.D.   On: 11/19/2020 13:14    Labs:  CBC: Recent Labs    11/10/20 0445 11/11/20 1701 11/12/20 0743 11/12/20 1548 11/12/20 2344 11/13/20 0742 11/19/20 0850  WBC 8.2  --  9.0  --   --  7.4 11.1*  HGB 11.1*   < > 10.1* 10.1* 9.7* 10.1*  10.1* 12.3*  HCT 31.9*   < > 28.7* 28.3* 27.4* 29.6*  29.2* 37.5*  PLT 262  --  258  --   --  242 341   < > = values in this interval not displayed.    COAGS: Recent Labs    11/10/20 1019 11/19/20 0850  INR 0.9 0.9    BMP: Recent Labs    11/12/20 0743 11/12/20 2344 11/13/20 0742 11/18/20 1056  NA 135 137 137 129*  K 3.8 3.3* 3.8 3.9  CL 101 102 101 94*  CO2 '24 26 25 24  '$ GLUCOSE 199* 122* 117* 294*  BUN '21 17 18 '$ 26*  CALCIUM 8.9 8.9 8.9 9.5  CREATININE 0.94 1.10 1.08 1.21  GFRNONAA >60 >60 >60 >60    LIVER FUNCTION TESTS: Recent Labs    11/12/20 0743 11/12/20 2344  11/13/20 0742 11/18/20 1056  BILITOT 16.5* 13.3* 12.8* 14.4*  AST 62* 62* 56* 38  ALT 52* 51* 52* 45*  ALKPHOS 311* 281* 283* 222*  PROT 5.8* 5.6* 5.6* 7.1  ALBUMIN 2.7* 2.6* 2.6* 3.6    TUMOR MARKERS: No results for input(s): AFPTM, CEA, CA199, CHROMGRNA in the last 8760 hours.  Assessment and Plan:  Malignant obstructed jaundice secondary to pancreatic cancer; S/p internal/external biliary drain and biliary stent placement; increasing bilirubin levels: Francisco Frank, 70 year old male, presents today to the [facility] department for an image-guided cholangiogram with possible drain exchange. He will be admitted by the surgical team following procedure in IR.   Risks and benefits discussed with the patient including bleeding, infection, damage to adjacent structures and sepsis.  All of the patient's questions were answered, patient is agreeable to proceed.  Consent signed and in chart.  Thank you for this interesting consult.  I greatly enjoyed meeting DRISTEN BUDZYNSKI and look forward to participating in their care.  A copy of this report was sent to the requesting provider on this date.  Electronically Signed: Soyla Dryer, AGACNP-BC [phone] 11/28/2020, 10:22 AM   I spent a total of  30 Minutes   in face to face in clinical consultation, greater than 50% of which was counseling/coordinating care for cholangiogram.

## 2020-11-29 DIAGNOSIS — C250 Malignant neoplasm of head of pancreas: Secondary | ICD-10-CM | POA: Diagnosis not present

## 2020-11-29 DIAGNOSIS — E43 Unspecified severe protein-calorie malnutrition: Secondary | ICD-10-CM | POA: Insufficient documentation

## 2020-11-29 LAB — COMPREHENSIVE METABOLIC PANEL
ALT: 53 U/L — ABNORMAL HIGH (ref 0–44)
AST: 51 U/L — ABNORMAL HIGH (ref 15–41)
Albumin: 2.4 g/dL — ABNORMAL LOW (ref 3.5–5.0)
Alkaline Phosphatase: 129 U/L — ABNORMAL HIGH (ref 38–126)
Anion gap: 7 (ref 5–15)
BUN: 20 mg/dL (ref 8–23)
CO2: 23 mmol/L (ref 22–32)
Calcium: 8.9 mg/dL (ref 8.9–10.3)
Chloride: 100 mmol/L (ref 98–111)
Creatinine, Ser: 1.22 mg/dL (ref 0.61–1.24)
GFR, Estimated: >60 mL/min (ref >60)
Glucose, Bld: 400 mg/dL — ABNORMAL HIGH (ref 70–99)
Potassium: 3.9 mmol/L (ref 3.5–5.1)
Sodium: 130 mmol/L — ABNORMAL LOW (ref 135–145)
Total Bilirubin: 7.2 mg/dL — ABNORMAL HIGH (ref 0.3–1.2)
Total Protein: 5.6 g/dL — ABNORMAL LOW (ref 6.5–8.1)

## 2020-11-29 LAB — CBC
HCT: 30 % — ABNORMAL LOW (ref 39.0–52.0)
Hemoglobin: 9.8 g/dL — ABNORMAL LOW (ref 13.0–17.0)
MCH: 30.2 pg (ref 26.0–34.0)
MCHC: 32.7 g/dL (ref 30.0–36.0)
MCV: 92.3 fL (ref 80.0–100.0)
Platelets: 220 10*3/uL (ref 150–400)
RBC: 3.25 MIL/uL — ABNORMAL LOW (ref 4.22–5.81)
RDW: 17.8 % — ABNORMAL HIGH (ref 11.5–15.5)
WBC: 9.9 10*3/uL (ref 4.0–10.5)
nRBC: 0 % (ref 0.0–0.2)

## 2020-11-29 LAB — GLUCOSE, CAPILLARY
Glucose-Capillary: 246 mg/dL — ABNORMAL HIGH (ref 70–99)
Glucose-Capillary: 347 mg/dL — ABNORMAL HIGH (ref 70–99)
Glucose-Capillary: 353 mg/dL — ABNORMAL HIGH (ref 70–99)
Glucose-Capillary: 196 mg/dL — ABNORMAL HIGH (ref 70–99)

## 2020-11-29 MED ORDER — DIPHENHYDRAMINE HCL 50 MG/ML IJ SOLN
12.5000 mg | Freq: Once | INTRAMUSCULAR | Status: AC
  Administered 2020-11-29: 12.5 mg via INTRAVENOUS
  Filled 2020-11-29: qty 1

## 2020-11-29 NOTE — Progress Notes (Addendum)
Referring Physician(s): Allen,Shelby L  Supervising Physician: Jacqulynn Cadet  Patient Status:  [facility] - In-pt  Chief Complaint:  Panc Ca Jaundice- persistent  Subjective:  TBili 16.5 on 8/24 - placement of internal external drain in IR  Tbili trending down to 12.8 but then increased again to 14.4 by 8/30  Biliary stent and external drain placed 8/31 in IR  T Bili trending down to 9.6 on 9/9 but still elevated.   IR was asked to evaluate per CCS  Cholangiogram showed a patent stent with some sludge.  The external drain was exchanged for an int/ext drain passing through the stent.  With stent + bag drainage, Br down to 7.2 today.    Plan to cap drain and allow internal drainage via combo of stent and int/ext drain.    Allergies: Norco [hydrocodone-acetaminophen]  Medications: Prior to Admission medications   Medication Sig Start Date End Date Taking? Authorizing Provider  ALPRAZolam (XANAX) 0.25 MG tablet Take 1 tablet (0.25 mg total) by mouth at bedtime as needed for up to 5 doses for anxiety. 11/13/20  Yes Antonieta Pert, MD  amLODipine (NORVASC) 5 MG tablet Take 5 mg by mouth at bedtime. 10/22/20  Yes [provider]  atenolol (TENORMIN) 50 MG tablet Take 50 mg by mouth at bedtime.   Yes [provider]  atorvastatin (LIPITOR) 40 MG tablet Take 40 mg by mouth daily. 11/16/20  Yes [provider]  clopidogrel (PLAVIX) 75 MG tablet Take 1 tablet (75 mg total) by mouth at bedtime. 11/14/20  Yes Kc, Maren Beach, MD  diphenhydramine-acetaminophen (TYLENOL PM) 25-500 MG TABS tablet Take 1 tablet by mouth at bedtime as needed. 11/26/20  Yes Ladell Pier, MD  Insulin Glargine [facility] KWIKPEN) 100 UNIT/ML Inject 6 Units into the skin at bedtime. 09/27/20  Yes [provider]  NOVOLOG FLEXPEN 100 UNIT/ML FlexPen Inject 10 Units into the skin 3 (three) times daily as needed for high blood sugar. If BS is 150-200=2 units, 201-250=4 units, 251-300=6 units,  301-350=8 units, 351-400=10 units. 09/18/20  Yes [provider]  sertraline (ZOLOFT) 100 MG tablet Take 1 tablet (100 mg total) by mouth daily. 11/14/20 12/14/20 Yes Kc, Maren Beach, MD  sodium chloride 0.9 % injection 10 mLs by Intracatheter route daily. Please dispense in 6m pre-filled syringes. 11/28/20  Yes ADwan Bolt MD  traMADol (ULTRAM) 50 MG tablet Take 50 mg by mouth every 6 (six) hours as needed for moderate pain or severe pain. 06/11/20  Yes [provider]  BD PEN NEEDLE NANO 2ND GEN 32G X 4 MM MISC  11/17/20   [provider]  cholestyramine (QUESTRAN) 4 g packet Take 1 packet (4 g total) by mouth 2 (two) times daily before a meal for 5 days. 11/29/20 12/04/20  ADwan Bolt MD  dicyclomine (BENTYL) 10 MG capsule Take 1 capsule (10 mg total) by mouth as needed for spasms. 10 mg po q 6 hours prn Patient not taking: No sig reported 08/06/20   Cirigliano, Vito V, DO  Sodium Chloride Flush (SALINE FLUSH) 0.9 % SOLN Inject 10 mLs into the vein daily. Flush drain with 5-10 mL 1-2 times daily. 11/13/20   MDocia Barrier PA     Vital Signs: BP 110/74 (BP Location: Right Arm)   Pulse (!) 51   Temp 98 F (36.7 C) (Oral)   Resp 20   Ht '5\' 8"'$  (1.727 m)   Wt 150 lb (68 kg)   SpO2 98%  BMI 22.81 kg/m   Physical Exam Skin:    General: Skin is warm.     Comments: Site is c/d/I No bleeding No leakage NT OP bile  Decrease in TBili---Capped per Dr Earleen Newport    Imaging: CT ABDOMEN PELVIS W WO CONTRAST  Result Date: 11/28/2020 CLINICAL DATA:  Staging pancreas mass. Evaluate for vascular invasion. EXAM: CT ABDOMEN AND PELVIS WITHOUT AND WITH CONTRAST TECHNIQUE: Multidetector CT imaging of the abdomen and pelvis was performed following the standard protocol before and following the bolus administration of intravenous contrast. CONTRAST:  15m OMNIPAQUE IOHEXOL 350 MG/ML SOLN COMPARISON:  MRI 11/06/2020. FINDINGS: Lower chest: No acute abnormality.  Hepatobiliary: No focal liver lesion identified. Pneumobilia is identified status post percutaneous transhepatic biliary stent placement and internal bile duct stent placement. Interval decompression of previous dilated intrahepatic and common bile ducts. Pancreas: Again noted is atrophy of the body through tail of pancreas with diffuse main duct dilatation. Mass within head of pancreas is somewhat ill-defined measuring approximately 3.5 x 2.8 cm, image 60/3. There is no involvement scratch set scratch set No signs of encasement of the celiac trunk or superior mesenteric artery. There is partial encasement of the portal venous confluence (at least 180 degrees), image 32/4. There is also partial encasement (approximately 180 degrees) of the superior mesenteric vein just distal to the confluence, image 36/4. Ventral extension of tumor abuts the posterior wall of the proximal duodenum, image 32/4. Cannot exclude invasion. Spleen: Normal in size without focal abnormality. Adrenals/Urinary Tract: Normal adrenal glands. The kidneys are unremarkable. No mass or hydronephrosis. Urinary bladder unremarkable. Stomach/Bowel: Stomach is nondilated. No bowel wall thickening, inflammation, or distension. Vascular/Lymphatic: Aortic atherosclerosis without aneurysm. No abdominal adenopathy. No pelvic adenopathy. Reproductive: Prostate is unremarkable. Other: No abdominal wall hernia or abnormality. No abdominopelvic ascites. Musculoskeletal: No acute or significant osseous findings. IMPRESSION: 1. Mass within head of pancreas is somewhat ill-defined measuring approximately 3.5 x 2.8 cm. There is partial encasement of the portal venous confluence and superior mesenteric vein. No signs of encasement of the celiac trunk or superior mesenteric artery. 2. No findings of nodal metastasis or solid organ metastasis within the abdomen or pelvis. 3. Status post percutaneous transhepatic biliary stent placement and internal bile duct stent  placement with decompression of previous dilated intrahepatic and common bile ducts. 4. Aortic atherosclerosis. Aortic Atherosclerosis (ICD10-I70.0). Electronically Signed   By: TKerby MoorsM.D.   On: 11/28/2020 16:24    Labs:  CBC: Recent Labs    11/13/20 0742 11/19/20 0850 11/28/20 1005 11/29/20 0052  WBC 7.4 11.1* 9.9 9.9  HGB 10.1*  10.1* 12.3* 11.6* 9.8*  HCT 29.6*  29.2* 37.5* 35.1* 30.0*  PLT 242 341 258 220    COAGS: Recent Labs    11/10/20 1019 11/19/20 0850 11/28/20 1005  INR 0.9 0.9 0.9    BMP: Recent Labs    11/13/20 0742 11/18/20 1056 11/28/20 1005 11/29/20 0052  NA 137 129* 133* 130*  K 3.8 3.9 3.5 3.9  CL 101 94* 101 100  CO2 25 24 20* 23  GLUCOSE 117* 294* 243* 400*  BUN 18 26* 19 20  CALCIUM 8.9 9.5 9.4 8.9  CREATININE 1.08 1.21 1.01 1.22  GFRNONAA >60 >60 >60 >60    LIVER FUNCTION TESTS: Recent Labs    11/13/20 0742 11/18/20 1056 11/28/20 1005 11/29/20 0052  BILITOT 12.8* 14.4* 9.6* 7.2*  AST 56* 38 63* 51*  ALT 52* 45* 59* 53*  ALKPHOS 283* 222* 159* 129*  PROT  5.6* 7.1 6.7 5.6*  ALBUMIN 2.6* 3.6 2.8* 2.4*    Assessment and Plan:  Panc Ca Jaundice  TBili 16.5 on 8/24 - placement of internal external drain in IR  Tbili trending down to 12.8 but then increased again to 14.4 by 8/30  Biliary stent and external drain placed 8/31 in IR  T Bili trending down to 9.6 on 9/9 but still elevated.   IR was asked to evaluate per CCS  Cholangiogram showed a patent stent with some sludge.  The external drain was exchanged for an int/ext drain passing through the stent.  With stent + bag drainage, Br down to 7.2 today.    Plan to cap drain and allow internal drainage via combo of stent and int/ext drain.    Patient will follow-up in IR in 1-2 weeks to re-assess Br and hopefully remove drain.   Electronically Signed: Lavonia Drafts, PA-C 11/29/2020, 9:34 AM   I spent a total of 15 Minutes at the the patient's bedside AND on  the patient's hospital floor or unit, greater than 50% of which was counseling/coordinating care for Int/Ext Bili drain

## 2020-11-29 NOTE — Progress Notes (Signed)
Notified Dr. Dema Severin that pt requesting sleep medication. Dr. Dema Severin gave order for bendaryl 12.5 mg IV x1. Will continue to monitor pt. Racheal Patches RN

## 2020-11-29 NOTE — Progress Notes (Signed)
1 Day Post-Op   Subjective/Chief Complaint: Doing Ok today No pain   Objective: Vital signs in last 24 hours: Temp:  [98 F (36.7 C)-99 F (37.2 C)] 98 F (36.7 C) (09/10 0824) Pulse Rate:  [50-69] 51 (09/10 0824) Resp:  [12-20] 20 (09/10 0824) BP: (94-123)/(70-82) 110/74 (09/10 0824) SpO2:  [9 %-100 %] 98 % (09/10 0824) Weight:  [68 kg] 68 kg (09/09 0936) Last BM Date: 11/28/20  Intake/Output from previous day: 09/09 0701 - 09/10 0700 In: 367 [P.O.:357] Out: 180 [Drains:180] Intake/Output this shift: No intake/output data recorded.   Physical Exam: BP 110/74 (BP Location: Right Arm)   Pulse (!) 51   Temp 98 F (36.7 C) (Oral)   Resp 20   Ht '5\' 8"'$  (1.727 m)   Wt 68 kg   SpO2 98%   BMI 22.81 kg/m   General: resting comfortably, appears stated age, no apparent distress Neurological: alert and oriented, no focal deficits, cranial nerves grossly in tact HEENT: normocephalic, atraumatic, oropharynx clear, icteric sclera CV: regular rate and rhythm, extremities warm and well-perfused Respiratory: normal work of breathing on room air, symmetric chest wall expansion Abdomen: soft, nondistended, nontender to deep palpation. No masses or organomegaly. RUQ biliary drain to gravity with bile in bag. Extremities: warm and well-perfused, no deformities, moving all extremities spontaneously Psychiatric: normal mood and affect Skin: warm and dry, jaundiced  Lab Results:  Recent Labs    11/28/20 1005 11/29/20 0052  WBC 9.9 9.9  HGB 11.6* 9.8*  HCT 35.1* 30.0*  PLT 258 220    CMP     Component Value Date/Time   NA 130 (L) 11/29/2020 0052   K 3.9 11/29/2020 0052   CL 100 11/29/2020 0052   CO2 23 11/29/2020 0052   GLUCOSE 400 (H) 11/29/2020 0052   BUN 20 11/29/2020 0052   CREATININE 1.22 11/29/2020 0052   CREATININE 1.21 11/18/2020 1056   CALCIUM 8.9 11/29/2020 0052   PROT 5.6 (L) 11/29/2020 0052   ALBUMIN 2.4 (L) 11/29/2020 0052   AST 51 (H) 11/29/2020 0052    AST 38 11/18/2020 1056   ALT 53 (H) 11/29/2020 0052   ALT 45 (H) 11/18/2020 1056   ALKPHOS 129 (H) 11/29/2020 0052   BILITOT 7.2 (H) 11/29/2020 0052   BILITOT 14.4 (HH) 11/18/2020 1056   GFRNONAA >60 11/29/2020 0052   GFRNONAA >60 11/18/2020 1056     PT/INR Recent Labs    11/28/20 1005  LABPROT 12.4  INR 0.9   ABG No results for input(s): PHART, HCO3 in the last 72 hours.  Invalid input(s): PCO2, PO2  Studies/Results: CT ABDOMEN PELVIS W WO CONTRAST  Result Date: 11/28/2020 CLINICAL DATA:  Staging pancreas mass. Evaluate for vascular invasion. EXAM: CT ABDOMEN AND PELVIS WITHOUT AND WITH CONTRAST TECHNIQUE: Multidetector CT imaging of the abdomen and pelvis was performed following the standard protocol before and following the bolus administration of intravenous contrast. CONTRAST:  28m OMNIPAQUE IOHEXOL 350 MG/ML SOLN COMPARISON:  MRI 11/06/2020. FINDINGS: Lower chest: No acute abnormality. Hepatobiliary: No focal liver lesion identified. Pneumobilia is identified status post percutaneous transhepatic biliary stent placement and internal bile duct stent placement. Interval decompression of previous dilated intrahepatic and common bile ducts. Pancreas: Again noted is atrophy of the body through tail of pancreas with diffuse main duct dilatation. Mass within head of pancreas is somewhat ill-defined measuring approximately 3.5 x 2.8 cm, image 60/3. There is no involvement scratch set scratch set No signs of encasement of the celiac trunk or  superior mesenteric artery. There is partial encasement of the portal venous confluence (at least 180 degrees), image 32/4. There is also partial encasement (approximately 180 degrees) of the superior mesenteric vein just distal to the confluence, image 36/4. Ventral extension of tumor abuts the posterior wall of the proximal duodenum, image 32/4. Cannot exclude invasion. Spleen: Normal in size without focal abnormality. Adrenals/Urinary Tract: Normal  adrenal glands. The kidneys are unremarkable. No mass or hydronephrosis. Urinary bladder unremarkable. Stomach/Bowel: Stomach is nondilated. No bowel wall thickening, inflammation, or distension. Vascular/Lymphatic: Aortic atherosclerosis without aneurysm. No abdominal adenopathy. No pelvic adenopathy. Reproductive: Prostate is unremarkable. Other: No abdominal wall hernia or abnormality. No abdominopelvic ascites. Musculoskeletal: No acute or significant osseous findings. IMPRESSION: 1. Mass within head of pancreas is somewhat ill-defined measuring approximately 3.5 x 2.8 cm. There is partial encasement of the portal venous confluence and superior mesenteric vein. No signs of encasement of the celiac trunk or superior mesenteric artery. 2. No findings of nodal metastasis or solid organ metastasis within the abdomen or pelvis. 3. Status post percutaneous transhepatic biliary stent placement and internal bile duct stent placement with decompression of previous dilated intrahepatic and common bile ducts. 4. Aortic atherosclerosis. Aortic Atherosclerosis (ICD10-I70.0). Electronically Signed   By: Kerby Moors M.D.   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 Stent likely occluded by sludge/debris. S/p IR cholangiogram with placement of an internal/external biliary drain. -Tb trending down -IR drain capped today -If TB con't to downtrend and no fevers Ok for home tomorrow   LOS: 0 days    Ralene Ok 11/29/2020

## 2020-11-30 DIAGNOSIS — C250 Malignant neoplasm of head of pancreas: Secondary | ICD-10-CM | POA: Diagnosis not present

## 2020-11-30 LAB — GLUCOSE, CAPILLARY
Glucose-Capillary: 195 mg/dL — ABNORMAL HIGH (ref 70–99)
Glucose-Capillary: 375 mg/dL — ABNORMAL HIGH (ref 70–99)

## 2020-11-30 LAB — COMPREHENSIVE METABOLIC PANEL
ALT: 57 U/L — ABNORMAL HIGH (ref 0–44)
AST: 49 U/L — ABNORMAL HIGH (ref 15–41)
Albumin: 2.5 g/dL — ABNORMAL LOW (ref 3.5–5.0)
Alkaline Phosphatase: 131 U/L — ABNORMAL HIGH (ref 38–126)
Anion gap: 8 (ref 5–15)
BUN: 21 mg/dL (ref 8–23)
CO2: 23 mmol/L (ref 22–32)
Calcium: 9 mg/dL (ref 8.9–10.3)
Chloride: 104 mmol/L (ref 98–111)
Creatinine, Ser: 0.94 mg/dL (ref 0.61–1.24)
GFR, Estimated: >60 mL/min (ref >60)
Glucose, Bld: 254 mg/dL — ABNORMAL HIGH (ref 70–99)
Potassium: 3.7 mmol/L (ref 3.5–5.1)
Sodium: 135 mmol/L (ref 135–145)
Total Bilirubin: 7 mg/dL — ABNORMAL HIGH (ref 0.3–1.2)
Total Protein: 5.9 g/dL — ABNORMAL LOW (ref 6.5–8.1)

## 2020-11-30 NOTE — Discharge Summary (Signed)
[facility] Discharge Summary   Patient ID: Francisco Frank MRN: [id number] DOB/AGE: 71-01-1951 71 y.o.  Admit date: 11/28/2020 Discharge date: 11/30/2020  Admitting Diagnosis: persistent jaundice adenocarcinoma of the pancreatic head Stent likely occluded by sludge/debris  Discharge Diagnosis adenocarcinoma of the pancreatic head Stent likely occluded by sludge/debris s/p IR cholangiogram with placement of an internal/external biliary drain  Consultants Interventional radiology - Dr. Earleen Newport  Imaging: CT ABDOMEN PELVIS W WO CONTRAST  Result Date: 11/28/2020 CLINICAL DATA:  Staging pancreas mass. Evaluate for vascular invasion. EXAM: CT ABDOMEN AND PELVIS WITHOUT AND WITH CONTRAST TECHNIQUE: Multidetector CT imaging of the abdomen and pelvis was performed following the standard protocol before and following the bolus administration of intravenous contrast. CONTRAST:  69m OMNIPAQUE IOHEXOL 350 MG/ML SOLN COMPARISON:  MRI 11/06/2020. FINDINGS: Lower chest: No acute abnormality. Hepatobiliary: No focal liver lesion identified. Pneumobilia is identified status post percutaneous transhepatic biliary stent placement and internal bile duct stent placement. Interval decompression of previous dilated intrahepatic and common bile ducts. Pancreas: Again noted is atrophy of the body through tail of pancreas with diffuse main duct dilatation. Mass within head of pancreas is somewhat ill-defined measuring approximately 3.5 x 2.8 cm, image 60/3. There is no involvement scratch set scratch set No signs of encasement of the celiac trunk or superior mesenteric artery. There is partial encasement of the portal venous confluence (at least 180 degrees), image 32/4. There is also partial encasement (approximately 180 degrees) of the superior mesenteric vein just distal to the confluence, image 36/4. Ventral extension of tumor abuts the posterior wall of the proximal duodenum, image 32/4. Cannot exclude  invasion. Spleen: Normal in size without focal abnormality. Adrenals/Urinary Tract: Normal adrenal glands. The kidneys are unremarkable. No mass or hydronephrosis. Urinary bladder unremarkable. Stomach/Bowel: Stomach is nondilated. No bowel wall thickening, inflammation, or distension. Vascular/Lymphatic: Aortic atherosclerosis without aneurysm. No abdominal adenopathy. No pelvic adenopathy. Reproductive: Prostate is unremarkable. Other: No abdominal wall hernia or abnormality. No abdominopelvic ascites. Musculoskeletal: No acute or significant osseous findings. IMPRESSION: 1. Mass within head of pancreas is somewhat ill-defined measuring approximately 3.5 x 2.8 cm. There is partial encasement of the portal venous confluence and superior mesenteric vein. No signs of encasement of the celiac trunk or superior mesenteric artery. 2. No findings of nodal metastasis or solid organ metastasis within the abdomen or pelvis. 3. Status post percutaneous transhepatic biliary stent placement and internal bile duct stent placement with decompression of previous dilated intrahepatic and common bile ducts. 4. Aortic atherosclerosis. Aortic Atherosclerosis (ICD10-I70.0). Electronically Signed   By: TKerby MoorsM.D.   On: 11/28/2020 16:24    Procedures Dr. WEarleen Newport(11/28/20) - IR cholangiogram with placement of an internal/external biliary drain  Hospital Course:  70yo male with a newly-diagnosed pancreatic cancer [facility].  He underwent tube cholangiogram, with exchange of external biliary drain for 46F int/ext biliary drain by IR on 9/9 and was admitted following this for observation. Tolerated procedure well and was transferred to the floor. He was placed on carb counting diet which he tolerated well and labs monitored. He was placed on cholestyramine and prn benadryl for cholestatic pruritis. He remained afebrile with improving tbili on the day after his procedure and  drain was capped. On day 2 after procedure his tbili and LFTs continued to decrease and he remained afebrile. He was deemed appropriate for discharge home with drain capped. He was instructed to flush drain at home and  will follow up with IR.  Patient will follow up in our office and knows to call with questions or concerns.  He will call to confirm appointment date/time.    Physical Exam: General:  Alert, NAD, pleasant, comfortable. Jaundiced HEENT: head normocephalic, atraumatic, mouth pink and moist CV: regular radial pulses. No cyanosis Pulm: respiratory effort normal on room air Abd:  Soft, ND, mild tenderness near drain. Drain capped MSK: MAE. No calf TTP bilaterally Psych: A&Ox3, normal affect  Allergies as of 11/30/2020       Reactions   Norco [hydrocodone-acetaminophen] Itching        Medication List     TAKE these medications    ALPRAZolam 0.25 MG tablet Commonly known as: XANAX Take 1 tablet (0.25 mg total) by mouth at bedtime as needed for up to 5 doses for anxiety.   amLODipine 5 MG tablet Commonly known as: NORVASC Take 5 mg by mouth at bedtime.   atenolol 50 MG tablet Commonly known as: TENORMIN Take 50 mg by mouth at bedtime.   atorvastatin 40 MG tablet Commonly known as: LIPITOR Take 40 mg by mouth daily.   Basaglar KwikPen 100 UNIT/ML Inject 9 Units into the skin at bedtime.   BD Pen Needle Nano 2nd Gen 32G X 4 MM Misc Generic drug: Insulin Pen Needle   cholestyramine 4 g packet Commonly known as: QUESTRAN Take 1 packet (4 g total) by mouth 2 (two) times daily before a meal for 5 days.   clopidogrel 75 MG tablet Commonly known as: PLAVIX Take 1 tablet (75 mg total) by mouth at bedtime.   dicyclomine 10 MG capsule Commonly known as: BENTYL Take 1 capsule (10 mg total) by mouth as needed for spasms. 10 mg po q 6 hours prn   diphenhydramine-acetaminophen 25-500 MG Tabs tablet Commonly known as: TYLENOL PM Take 1 tablet by mouth at bedtime as  needed.   NovoLOG FlexPen 100 UNIT/ML FlexPen Generic drug: insulin aspart Inject 2-10 Units into the skin 3 (three) times daily as needed for high blood sugar. If BS is 150-200=2 units, 201-250=4 units, 251-300=6 units, 301-350=8 units, 351-400=10 units.   Saline Flush 0.9 % Soln Inject 10 mLs into the vein daily. Flush drain with 5-10 mL 1-2 times daily.   sertraline 100 MG tablet Commonly known as: ZOLOFT Take 1 tablet (100 mg total) by mouth daily.   sodium chloride 0.9 % injection 10 mLs by Intracatheter route daily. Please dispense in 13m pre-filled syringes.   traMADol 50 MG tablet Commonly known as: ULTRAM Take 50 mg by mouth every 6 (six) hours as needed for moderate pain or severe pain.          Follow-up Information     [facility], [place].   Why: call to make an appointment in 1-2 weeks Contact information: [address] [phone]        ADwan Bolt MD. Call.   Specialty: General Surgery Why: call to make a follow up appointment Contact information: [address] [place] [phone]                 Signed: MCaroll RancherCSheepshead Bay Surgery CenterSurgery 11/30/2020, 10:01 AM Please see Amion for pager number during day hours 7:00am-4:30pm

## 2020-11-30 NOTE — Plan of Care (Signed)
  Problem: Education: Goal: Knowledge of General Education information will improve Description: Including pain rating scale, medication(s)/side effects and non-pharmacologic comfort measures Outcome: Adequate for Discharge   Problem: Health Behavior/Discharge Planning: Goal: Ability to manage health-related needs will improve Outcome: Adequate for Discharge   Problem: Clinical Measurements: Goal: Ability to maintain clinical measurements within normal limits will improve Outcome: Adequate for Discharge Goal: Will remain free from infection Outcome: Adequate for Discharge Goal: Diagnostic test results will improve Outcome: Adequate for Discharge Goal: Respiratory complications will improve Outcome: Adequate for Discharge Goal: Cardiovascular complication will be avoided Outcome: Adequate for Discharge   Problem: Coping: Goal: Level of anxiety will decrease Outcome: Adequate for Discharge   Problem: Elimination: Goal: Will not experience complications related to bowel motility Outcome: Adequate for Discharge Goal: Will not experience complications related to urinary retention Outcome: Adequate for Discharge   Problem: Pain Managment: Goal: General experience of comfort will improve Outcome: Adequate for Discharge   Problem: Safety: Goal: Ability to remain free from injury will improve Outcome: Adequate for Discharge   Problem: Skin Integrity: Goal: Risk for impaired skin integrity will decrease Outcome: Adequate for Discharge   

## 2020-12-01 ENCOUNTER — Other Ambulatory Visit (HOSPITAL_COMMUNITY): Payer: Self-pay | Admitting: Interventional Radiology

## 2020-12-01 DIAGNOSIS — K8689 Other specified diseases of pancreas: Secondary | ICD-10-CM

## 2020-12-02 ENCOUNTER — Other Ambulatory Visit: Payer: Self-pay | Admitting: Surgery

## 2020-12-02 DIAGNOSIS — C801 Malignant (primary) neoplasm, unspecified: Secondary | ICD-10-CM

## 2020-12-03 ENCOUNTER — Other Ambulatory Visit (HOSPITAL_COMMUNITY): Payer: Medicare Other

## 2020-12-03 ENCOUNTER — Ambulatory Visit (HOSPITAL_COMMUNITY): Payer: Medicare Other

## 2020-12-04 ENCOUNTER — Inpatient Hospital Stay (HOSPITAL_BASED_OUTPATIENT_CLINIC_OR_DEPARTMENT_OTHER): Payer: Medicare Other | Admitting: Genetic Counselor

## 2020-12-04 ENCOUNTER — Other Ambulatory Visit: Payer: Self-pay

## 2020-12-04 ENCOUNTER — Inpatient Hospital Stay: Payer: Medicare Other

## 2020-12-04 ENCOUNTER — Other Ambulatory Visit: Payer: Self-pay | Admitting: Genetic Counselor

## 2020-12-04 DIAGNOSIS — C259 Malignant neoplasm of pancreas, unspecified: Secondary | ICD-10-CM

## 2020-12-04 DIAGNOSIS — Z8042 Family history of malignant neoplasm of prostate: Secondary | ICD-10-CM | POA: Diagnosis not present

## 2020-12-04 LAB — GENETIC SCREENING ORDER

## 2020-12-05 ENCOUNTER — Other Ambulatory Visit: Payer: Self-pay | Admitting: *Deleted

## 2020-12-05 DIAGNOSIS — C259 Malignant neoplasm of pancreas, unspecified: Secondary | ICD-10-CM

## 2020-12-09 ENCOUNTER — Encounter: Payer: Self-pay | Admitting: Genetic Counselor

## 2020-12-09 ENCOUNTER — Other Ambulatory Visit (HOSPITAL_COMMUNITY): Payer: Self-pay | Admitting: Physician Assistant

## 2020-12-09 NOTE — Progress Notes (Signed)
REFERRING PROVIDER: Ladell Pier, MD [place],  [place] [address]   PRIMARY PROVIDER:  Kennieth Rad, MD  PRIMARY REASON FOR VISIT:  1. Malignant neoplasm of pancreas, unspecified location of malignancy ([place])   2. Family history of prostate cancer    HISTORY OF PRESENT ILLNESS:   Francisco Frank, a 70 y.o. male, was seen for a [facility] cancer genetics consultation at the request of Dr. Benay Spice due to a personal history of cancer.  Francisco Frank presents to clinic today to discuss the possibility of a hereditary predisposition to cancer, to discuss genetic testing, and to further clarify his future cancer risks, as well as potential cancer risks for family members.   In 2022, at the age of 38, Francisco Frank was diagnosed with pancreatic cancer.   RISK FACTORS:  PSA: annually per patient Colonoscopy: yes;  most recent in 07/2020 ; tubular adenoma neg for HGD Dermatology as needed.  PH diabetes: yes PH pancreatitis: not reported  Past Medical History:  Diagnosis Date   Back pain    Diabetes mellitus without complication ([place])    Hearing deficit    Hyperlipemia    Hypertension    Insomnia    Sleep apnea     Past Surgical History:  Procedure Laterality Date   BIOPSY  11/11/2020   Procedure: BIOPSY;  Surgeon: Rush Landmark Telford Nab., MD;  Location: [facility];  Service: Gastroenterology;;   CARDIAC CATHETERIZATION     ENDOSCOPIC RETROGRADE CHOLANGIOPANCREATOGRAPHY (ERCP) WITH PROPOFOL N/A 11/11/2020   Procedure: ENDOSCOPIC RETROGRADE CHOLANGIOPANCREATOGRAPHY (ERCP) WITH PROPOFOL;  Surgeon: Irving Copas., MD;  Location: [facility];  Service: Gastroenterology;  Laterality: N/A;   ESOPHAGOGASTRODUODENOSCOPY (EGD) WITH PROPOFOL N/A 11/11/2020   Procedure: ESOPHAGOGASTRODUODENOSCOPY (EGD) WITH PROPOFOL;  Surgeon: Rush Landmark Telford Nab., MD;  Location: [facility];  Service: Gastroenterology;  Laterality: N/A;   EUS N/A 11/11/2020   Procedure: UPPER  ENDOSCOPIC ULTRASOUND (EUS) RADIAL;  Surgeon: Irving Copas., MD;  Location: [facility];  Service: Gastroenterology;  Laterality: N/A;   FINE NEEDLE ASPIRATION  11/11/2020   Procedure: FINE NEEDLE ASPIRATION;  Surgeon: Rush Landmark Telford Nab., MD;  Location: [facility];  Service: Gastroenterology;;   HEMOSTASIS CONTROL  11/11/2020   Procedure: HEMOSTASIS CONTROL;  Surgeon: Irving Copas., MD;  Location: [facility];  Service: Gastroenterology;;  epi injection   IR BILIARY STENT(S) EXISTING ACCESS INC DILATION CATH EXCHANGE  11/19/2020   IR CONVERT BILIARY DRAIN TO INT EXT BILIARY DRAIN  11/28/2020   IR PERCUTANEOUS TRANSHEPATIC CHOLANGIOGRAM  11/12/2020   REPLACEMENT TOTAL KNEE     ROTATOR CUFF REPAIR     SPHINCTEROTOMY  11/11/2020   Procedure: SPHINCTEROTOMY;  Surgeon: Mansouraty, Telford Nab., MD;  Location: [facility];  Service: Gastroenterology;;    Social History   Socioeconomic History   Marital status: Married    Spouse name: Not on file   Number of children: Not on file   Years of education: Not on file   Highest education level: Not on file  Occupational History   Not on file  Tobacco Use   Smoking status: Never   Smokeless tobacco: Current    Types: Snuff    Last attempt to quit: 2018  Vaping Use   Vaping Use: Never used  Substance and Sexual Activity   Alcohol use: Yes    Comment: rare   Drug use: Never   Sexual activity: Not Currently  Other Topics Concern   Not on file  Social History Narrative   Not  on file   Social Determinants of Health   Financial Resource Strain: Not on file  Food Insecurity: Not on file  Transportation Needs: Not on file  Physical Activity: Not on file  Stress: Not on file  Social Connections: Not on file     FAMILY HISTORY:  We obtained a detailed, 4-generation family history.  Significant diagnoses are listed below: Family History  Problem Relation Age of Onset   Leukemia Mother        dx 68s   Colon  polyps Maternal Grandmother        unknown #   Prostate cancer Paternal Grandfather        dx after 4     Francisco Frank is unaware of previous family history of genetic testing for hereditary cancer risks. Patient's maternal ancestors are of [place], [place], and [place] descent, and paternal ancestors are of [place] and Native American descent. There is no reported Ashkenazi Jewish ancestry. There is no known consanguinity.  GENETIC COUNSELING ASSESSMENT: Francisco Frank is a 70 y.o. male with a personal history of cancer which is somewhat suggestive of a hereditary cancer syndrome and predisposition to cancer given his diagnosis of pancreatic cancer. We, therefore, discussed and recommended the following at today's visit.   DISCUSSION: We discussed that 5 - 10% of cancer is hereditary, with most cases of hereditary pancreatic cancer associated with mutations in BRCA1/2.  There are other genes that can be associated with hereditary pancreatic cancer syndromes.  Type of cancer risk and level of risk are gene-specific.  We discussed that testing is beneficial for several reasons including knowing how to follow individuals for their cancer risks, identifying whether potential treatment options, such as PARP inhibitors, would be beneficial, and understanding if other family members could be at risk for cancer and allowing them to undergo genetic testing.   We reviewed the characteristics, features and inheritance patterns of hereditary cancer syndromes. We also discussed genetic testing, including the appropriate family members to test, the process of testing, insurance coverage and turn-around-time for results. We discussed the implications of a negative, positive, carrier and/or variant of uncertain significant result. We recommended Francisco Frank pursue genetic testing for a panel that includes genes associated with pancreatic cancer.   The CustomNext-Cancer +RNAinsight Panel offered by [facility] and  includes sequencing and rearrangement analysis for the following 91 genes: AIP, ALK, APC, ATM, AXIN2, BAP1, BARD1, BLM, BMPR1A, BRCA1, BRCA2, BRIP1, CDC73, CDH1, CDK4, CDKN1B, CDKN2A, CHEK2, CTNNA1, DICER1, FANCC, FH, FLCN, GALNT12, KIF1B, LZTR1, MAX, MEN1, MET, MLH1, MRE11A, MSH2, MSH3, MSH6, MUTYH, NBN, NF1, NF2, NTHL1, PALB2, PHOX2B, PMS2, POT1, PRKAR1A, PTCH1, PTEN, RAD50, RAD51C, RAD51D, RB1, RECQL, RET, SDHA, SDHAF2, SDHB, SDHC, SDHD, SMAD4, SMARCA4, SMARCB1, SMARCE1, STK11, SUFU, TMEM127, TP53, TSC1, TSC2, VHL and XRCC2 (sequencing and deletion/duplication); CASR, CFTR, CPA1, CTRC, EGFR, EGLN1, FAM175A, HOXB13, KIT, MITF, MLH3, PALLD, PDGFRA, POLD1, POLE, PRSS1, RINT1, RPS20, SPINK1 and TERT (sequencing only); EPCAM and GREM1 (deletion/duplication only). RNA data is routinely analyzed for use in variant interpretation for all genes.  Based on Francisco Frank personal history of cancer, he meets medical criteria for genetic testing. Despite that he meets criteria, he may still have an out of pocket cost. We discussed that if his out of pocket cost for testing is over $100, the laboratory should contact him to discuss self-pay options and/or patient pay assistance programs.   PLAN: After considering the risks, benefits, and limitations, Francisco Frank provided informed consent to pursue genetic testing and the blood  sample was sent to [facility] for analysis of the CustomNext-Cancer +RNAinsight Panel. Results should be available within approximately 3 weeks' time, at which point they will be disclosed by telephone to Francisco Frank, as will any additional recommendations warranted by these results. Francisco Frank will receive a summary of his genetic counseling visit and a copy of his results once available. This information will also be available in Epic.   Lastly, we encouraged Francisco Frank to remain in contact with cancer genetics annually so that we can continuously update the family history and inform him of  any changes in cancer genetics and testing that may be of benefit for this family.   Francisco Frank questions were answered to his satisfaction today. Our contact information was provided should additional questions or concerns arise. Thank you for the referral and allowing [place] to share in the care of your patient.   Elston Aldape M. Joette Catching, [place], [facility] Genetic Counselor [email or web address] (P) [phone]  The patient was seen for a total of 35 minutes in face-to-face genetic counseling.  The patient was accompanied by his wife.  Drs. Magrinat, Lindi Adie and/or Burr Medico were available to discuss this case as needed.    _______________________________________________________________________ For Office Staff:  Number of people involved in session: 2 Was an Intern/ student involved with case: no

## 2020-12-10 ENCOUNTER — Encounter: Payer: Self-pay | Admitting: *Deleted

## 2020-12-10 ENCOUNTER — Other Ambulatory Visit: Payer: Self-pay | Admitting: Nurse Practitioner

## 2020-12-10 ENCOUNTER — Other Ambulatory Visit: Payer: Self-pay

## 2020-12-10 ENCOUNTER — Inpatient Hospital Stay: Payer: Medicare Other

## 2020-12-10 ENCOUNTER — Inpatient Hospital Stay: Payer: Medicare Other | Admitting: Oncology

## 2020-12-10 ENCOUNTER — Other Ambulatory Visit: Payer: Self-pay | Admitting: Physician Assistant

## 2020-12-10 VITALS — BP 124/73 | HR 65 | Temp 98.1°F | Resp 20 | Ht 68.0 in | Wt 150.4 lb

## 2020-12-10 DIAGNOSIS — C259 Malignant neoplasm of pancreas, unspecified: Secondary | ICD-10-CM | POA: Insufficient documentation

## 2020-12-10 DIAGNOSIS — C250 Malignant neoplasm of head of pancreas: Secondary | ICD-10-CM

## 2020-12-10 DIAGNOSIS — K810 Acute cholecystitis: Secondary | ICD-10-CM | POA: Diagnosis not present

## 2020-12-10 LAB — CMP (CANCER CENTER ONLY)
ALT: 57 U/L — ABNORMAL HIGH (ref 0–44)
AST: 41 U/L (ref 15–41)
Albumin: 3.6 g/dL (ref 3.5–5.0)
Alkaline Phosphatase: 130 U/L — ABNORMAL HIGH (ref 38–126)
Anion gap: 10 (ref 5–15)
BUN: 19 mg/dL (ref 8–23)
CO2: 24 mmol/L (ref 22–32)
Calcium: 9.6 mg/dL (ref 8.9–10.3)
Chloride: 103 mmol/L (ref 98–111)
Creatinine: 1.07 mg/dL (ref 0.61–1.24)
GFR, Estimated: >60 mL/min (ref >60)
Glucose, Bld: 232 mg/dL — ABNORMAL HIGH (ref 70–99)
Potassium: 3.7 mmol/L (ref 3.5–5.1)
Sodium: 137 mmol/L (ref 135–145)
Total Bilirubin: 4.9 mg/dL (ref 0.3–1.2)
Total Protein: 7 g/dL (ref 6.5–8.1)

## 2020-12-10 LAB — CBC WITH DIFFERENTIAL (CANCER CENTER ONLY)
Abs Immature Granulocytes: 0.22 10*3/uL — ABNORMAL HIGH (ref 0.00–0.07)
Basophils Absolute: 0.1 10*3/uL (ref 0.0–0.1)
Basophils Relative: 0 %
Eosinophils Absolute: 0.2 10*3/uL (ref 0.0–0.5)
Eosinophils Relative: 1 %
HCT: 33.6 % — ABNORMAL LOW (ref 39.0–52.0)
Hemoglobin: 11.2 g/dL — ABNORMAL LOW (ref 13.0–17.0)
Immature Granulocytes: 1 %
Lymphocytes Relative: 10 %
Lymphs Abs: 2 10*3/uL (ref 0.7–4.0)
MCH: 30.7 pg (ref 26.0–34.0)
MCHC: 33.3 g/dL (ref 30.0–36.0)
MCV: 92.1 fL (ref 80.0–100.0)
Monocytes Absolute: 1.7 10*3/uL — ABNORMAL HIGH (ref 0.1–1.0)
Monocytes Relative: 8 %
Neutro Abs: 15.9 10*3/uL — ABNORMAL HIGH (ref 1.7–7.7)
Neutrophils Relative %: 80 %
Platelet Count: 327 10*3/uL (ref 150–400)
RBC: 3.65 MIL/uL — ABNORMAL LOW (ref 4.22–5.81)
RDW: 16.6 % — ABNORMAL HIGH (ref 11.5–15.5)
WBC Count: 20 10*3/uL — ABNORMAL HIGH (ref 4.0–10.5)
nRBC: 0 % (ref 0.0–0.2)

## 2020-12-10 NOTE — Progress Notes (Signed)
[facility] OFFICE PROGRESS NOTE   Diagnosis: Pancreas cancer  INTERVAL HISTORY:   Mr Wenke underwent placement of a percutaneous internal/external drain by interventional radiology on 11/19/2020.  When he saw Dr. Zenia Resides he had persistent jaundice and reported pruritus.  He was admitted for further evaluation on 11/28/2020.  A cholangiogram revealed a patent stent with sludge.  He underwent exchange of the internal/external drain on 11/28/2020.  The drain was capped.  He was discharged to home on 11/30/2020.  The bilirubin was down to 7.0 on the day of discharge.  Mr. Ogas reports straining his back recently and is using a lidocaine patch.  His chief complaint is pain at the biliary drain site.  He is scheduled for an evaluation in interventional radiology tomorrow.  He reports a good appetite.  His blood sugar has been running as high as 300.  Objective:  Vital signs in last 24 hours:  Blood pressure 124/73, pulse 65, temperature 98.1 F (36.7 C), temperature source Oral, resp. rate 20, height 5\' 8"  (1.727 m), weight 150 lb 6.4 oz (68.2 kg), SpO2 100 %.    Resp: Lungs clear bilaterally Cardio: Regular rate and rhythm GI: No hepatosplenomegaly, no mass Vascular: No leg edema  Skin: Jaundice Musculoskeletal: Biliary drain exiting from the right lower anterior chest, no surrounding erythema   Lab Results:  Lab Results  Component Value Date   WBC 20.0 (H) 12/10/2020   HGB 11.2 (L) 12/10/2020   HCT 33.6 (L) 12/10/2020   MCV 92.1 12/10/2020   PLT 327 12/10/2020   NEUTROABS 15.9 (H) 12/10/2020    CMP  Lab Results  Component Value Date   NA 137 12/10/2020   K 3.7 12/10/2020   CL 103 12/10/2020   CO2 24 12/10/2020   GLUCOSE 232 (H) 12/10/2020   BUN 19 12/10/2020   CREATININE 1.07 12/10/2020   CALCIUM 9.6 12/10/2020   PROT 7.0 12/10/2020   ALBUMIN 3.6 12/10/2020   AST 41 12/10/2020   ALT 57 (H) 12/10/2020   ALKPHOS 130 (H) 12/10/2020   BILITOT 4.9 (HH)  12/10/2020   GFRNONAA >60 12/10/2020    Lab Results  Component Value Date   NTI144 1,641 (H) 11/11/2020   Medications: I have reviewed the patient's current medications.   Assessment/Plan: Pancreas cancer Outside CT-apparent pancreas abnormality (we do not have a copy of the report) MRI abdomen 11/06/2020-pancreatic body and tail atrophy with duct dilatation up to 9 mm.  Both ducts undergo an abrupt transition in the region of the pancreatic head.  Non border deforming pancreatic head mass measuring 3.2 x 2.6 cm.  No arterial involvement by tumor.  SMV adjacent to presumed tumor without encasement.  Portal vein uninvolved.  No abdominal adenopathy.  Left periaortic 7 mm node not pathologic by size criteria.  No ascites.  Subtle left-sided pericolonic nodule 5 mm. EUS/ERCP 11/11/2020-EGD showed gastritis which was biopsied, mucosal changes in the duodenum, mucosal changes in the duodenum sweep (gastric antral and oxyntic mucosa with no specific histopathologic changes; Warthin Starry stain negative for H pylori).  EUS showed an irregular mass in the pancreatic head measuring 26 mm x 30 mm; the outer margins were irregular.  There was sonographic evidence suggesting invasion into the portal vein (manifested by abutment) and the superior mesenteric vein (manifested by abutment); an intact interface was seen between the mass and the superior mesenteric artery and celiac trunk suggesting a lack of invasion.  Endosonographic imaging in the visualized portion of the liver showed  no mass.  There was dilatation of the common bile duct and the common hepatic duct.  No malignant appearing lymph nodes were visualized in the celiac region, peripancreatic region, porta hepatis region.  FNA biopsy of the pancreas head mass showed malignant cells consistent with adenocarcinoma.  Biliary access could not be obtained.  Cholangiogram 11/12/2020-severe intrahepatic biliary dilatation.  A peripheral right hepatic bile duct  was successfully cannulated.  Internal/external biliary drain placed.  Cytology on biliary drain bile showed malignant cells consistent with adenocarcinoma.   CT chest 11/13/2020-no evidence of metastatic disease.   CT pelvis 11/13/2020-nonspecific circumferential wall thickening of the cecum measuring up to 12 mm in thickness.  Otherwise no evidence of intrapelvic metastases.   11/19/2020-biliary stent placement, biliary drain exchange 11/28/2020-exchange of internal/external biliary drain secondary to persistent jaundice CT abdomen/pelvis 11/28/2020-pancreas head mass, 3.5 x 2.8 cm, no encasement of the celiac trunk or SMA, partial encasement of the portal venous confluence-at least 180 degrees, partial encasement of the SMV-approximately 180 degrees, ventral extension of tumor abuts the posterior duodenum, no adenopathy, percutaneous biliary stent with decompression of previous dilated intrahepatic and common bile ducts  Painless jaundice-internal/external biliary drain placed 11/12/2020.  Follow-up with Interventional Radiology 11/21/2020. Weight loss Diabetes diagnosed June 2022 Change in bowel habits February 2022 Colonoscopy 08/06/2020-8 mm polyp in the sigmoid colon, a few small and large mouth diverticula in the sigmoid colon, normal mucosa found in the entire colon with biopsies for histology taken with a cold forceps from the right colon and left colon for evaluation of microscopic colitis (right colon-colonic mucosa with no significant pathologic findings; left colon-colonic mucosa with no significant pathologic findings; sigmoid polyp-tubular adenoma, negative for high-grade dysplasia). Hypertension    Disposition: Mr. Gurry has been diagnosed with pancreas cancer.  He peers to have locally advanced, "borderline "resectable disease.  I discussed treatment options with Mr. Stavros and his wife.  His case was presented at the GI tumor conference this morning.  The recommendation from the GI  conference is to proceed with neoadjuvant therapy to be followed by surgery.  We will refer him to Dr. Zenia Resides for Port-A-Cath placement.  I discussed gemcitabine/Abraxane and FOLFIRINOX with Mr. Scully.  He appears to be a candidate for FOLFIRINOX.  The plan is to start with FOLFOX for 1 or 2 cycles and add irinotecan depending on tolerance.  I reviewed potential toxicities associated with the FOLFIRINOX regimen including the chance of nausea/vomiting, mucositis, diarrhea, alopecia, and hematologic toxicity.  We discussed the sun sensitivity, rash, hyperpigmentation, and hand/foot syndrome associated with 5-fluorouracil.  We reviewed the allergic reaction and various types of neuropathy seen with oxaliplatin.  We discussed the acute/delayed diarrhea from irinotecan.  He agrees to proceed.  He will attend a chemotherapy teaching class.  The plan is to begin neoadjuvant chemotherapy on 12/24/2020.  A chemotherapy plan was entered today.    Betsy Coder, MD  12/10/2020  2:02 PM

## 2020-12-10 NOTE — Progress Notes (Signed)
Met with [name] and his wife with Dr Benay Spice and reviewed upcoming treatment plan, chemo education completed during this visit as well as PAC placement teaching. Message sent to Dr Zenia Resides to get [facility] placement scheduled. He is to have external stent removed tomorrow via IR at [facility]. Encouraged patient to call with any issues or questions

## 2020-12-10 NOTE — Progress Notes (Signed)
START ON PATHWAY REGIMEN - Pancreatic Adenocarcinoma     A cycle is every 14 days:     Oxaliplatin      Leucovorin      Irinotecan      Fluorouracil   **Always confirm dose/schedule in your pharmacy ordering system**  Patient Characteristics: Preoperative (Clinical Staging), Borderline Resectable, PS = 0,1, BRCA1/2 and PALB2 Mutation Absent/Unknown Therapeutic Status: Preoperative (Clinical Staging) AJCC T Category: Staged < 8th Ed. AJCC N Category: Staged < 8th Ed. Resectability Status: Borderline Resectable AJCC M Category: Staged < 8th Ed. AJCC 8 Stage Grouping: Staged < 8th Ed. ECOG Performance Status: 1 BRCA1/2 Mutation Status: Awaiting Test Results PALB2 Mutation Status: Awaiting Test Results Intent of Therapy: Curative Intent, Discussed with Patient 

## 2020-12-10 NOTE — Progress Notes (Signed)
The proposed treatment discussed in conference is for discussion purpose only and is not a binding recommendation.  The patients have not been physically examined, or presented with their treatment options.  Therefore, final treatment plans cannot be decided.  

## 2020-12-11 ENCOUNTER — Ambulatory Visit (HOSPITAL_COMMUNITY)
Admission: RE | Admit: 2020-12-11 | Discharge: 2020-12-11 | Disposition: A | Discharge status: Home / Self Care | Payer: Medicare Other | Source: Ambulatory Visit | Attending: Interventional Radiology | Admitting: Interventional Radiology

## 2020-12-11 ENCOUNTER — Encounter (HOSPITAL_COMMUNITY): Payer: Self-pay

## 2020-12-11 ENCOUNTER — Other Ambulatory Visit: Payer: Self-pay

## 2020-12-11 DIAGNOSIS — Z4803 Encounter for change or removal of drains: Secondary | ICD-10-CM | POA: Insufficient documentation

## 2020-12-11 DIAGNOSIS — K831 Obstruction of bile duct: Secondary | ICD-10-CM

## 2020-12-11 HISTORY — PX: IR CHOLANGIOGRAM EXISTING TUBE: IMG6040

## 2020-12-11 LAB — BILIRUBIN, TOTAL: Total Bilirubin: 4.6 mg/dL — ABNORMAL HIGH (ref 0.3–1.2)

## 2020-12-11 LAB — GLUCOSE, CAPILLARY: Glucose-Capillary: 223 mg/dL — ABNORMAL HIGH (ref 70–99)

## 2020-12-11 IMAGING — XA IR CHOLANGIOGRAM VIA EXIST CATHETER
1 series · 4 of 4 positions shown · non-contrast
Comparison: none

INDICATION: 70-year-old male with malignant obstructed jaundice secondary to
pancreatic cancer. He has an internal metallic stent as well as an
internal/external biliary drainage catheter which has been capped.
He is tolerating the capped catheter well. His bilirubin continues
to trend down and is the lowest it has been since the time of stent
placement. He finds the internal/external drainage catheter very
painful. He presents today for transhepatic cholangiogram and
possible drain removal.

[Series 1: processed: ir cholangiogram existing tub · 4 of 71 frames shown]
[frame 11/71]
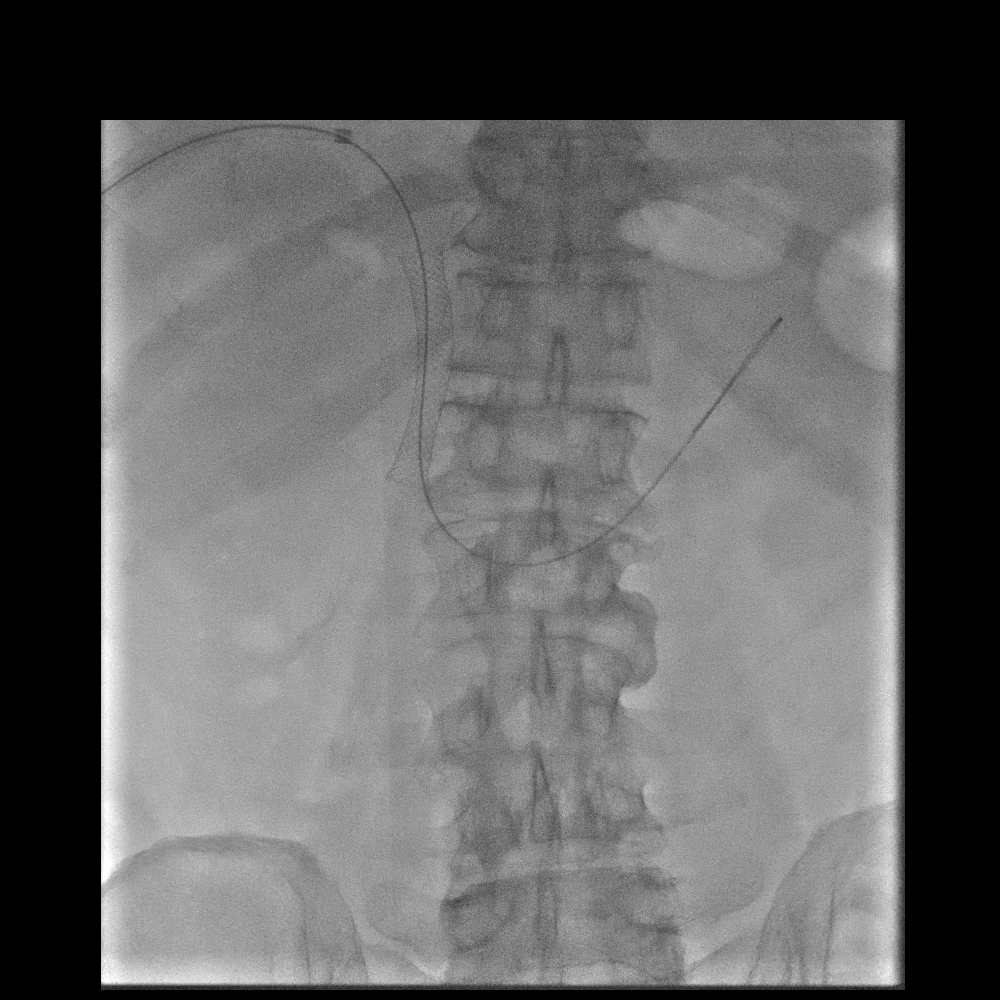
[frame 36/71]
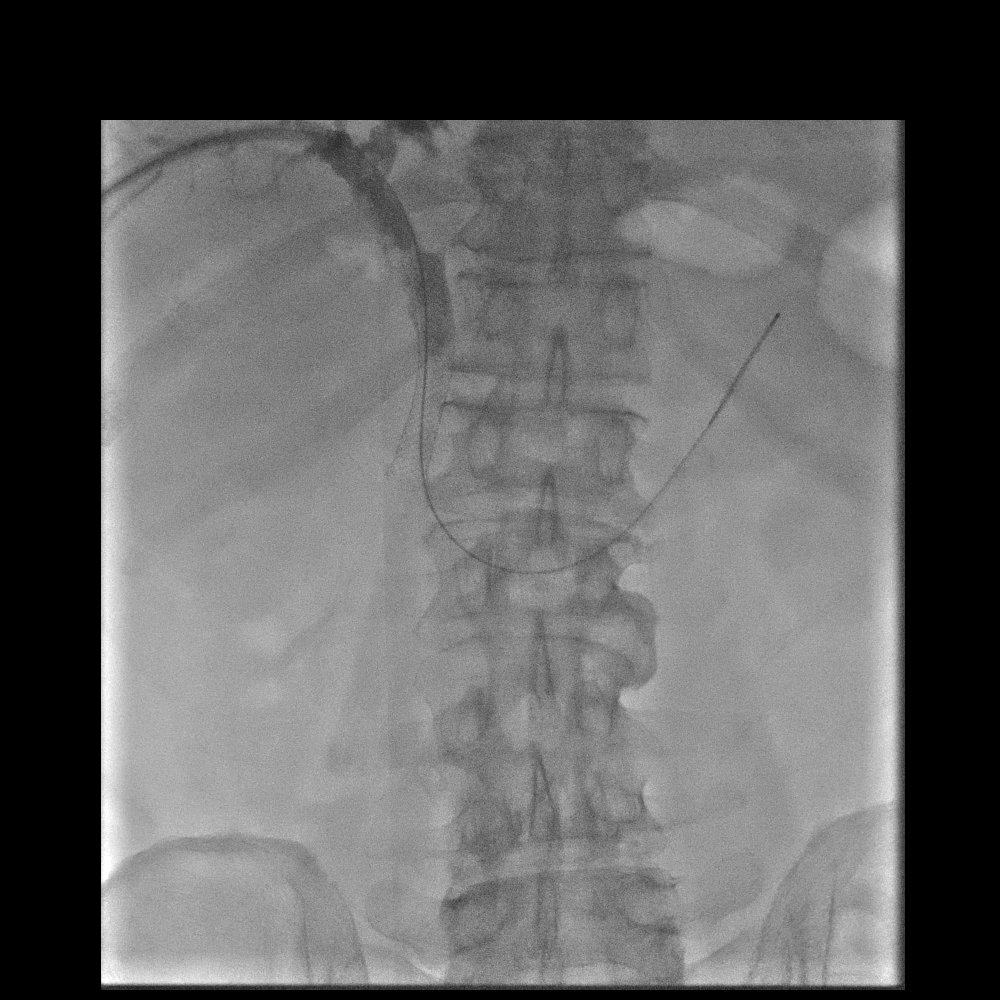
[frame 50/71]
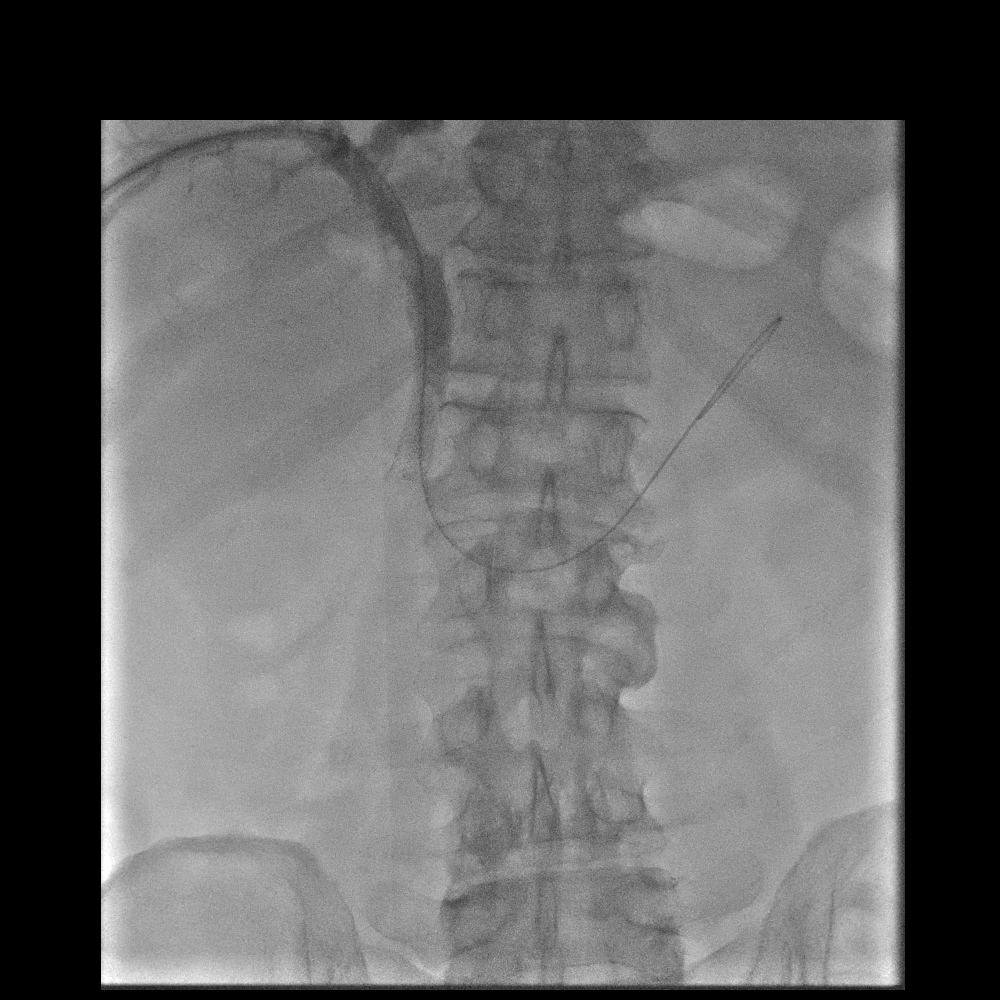
[frame 61/71]
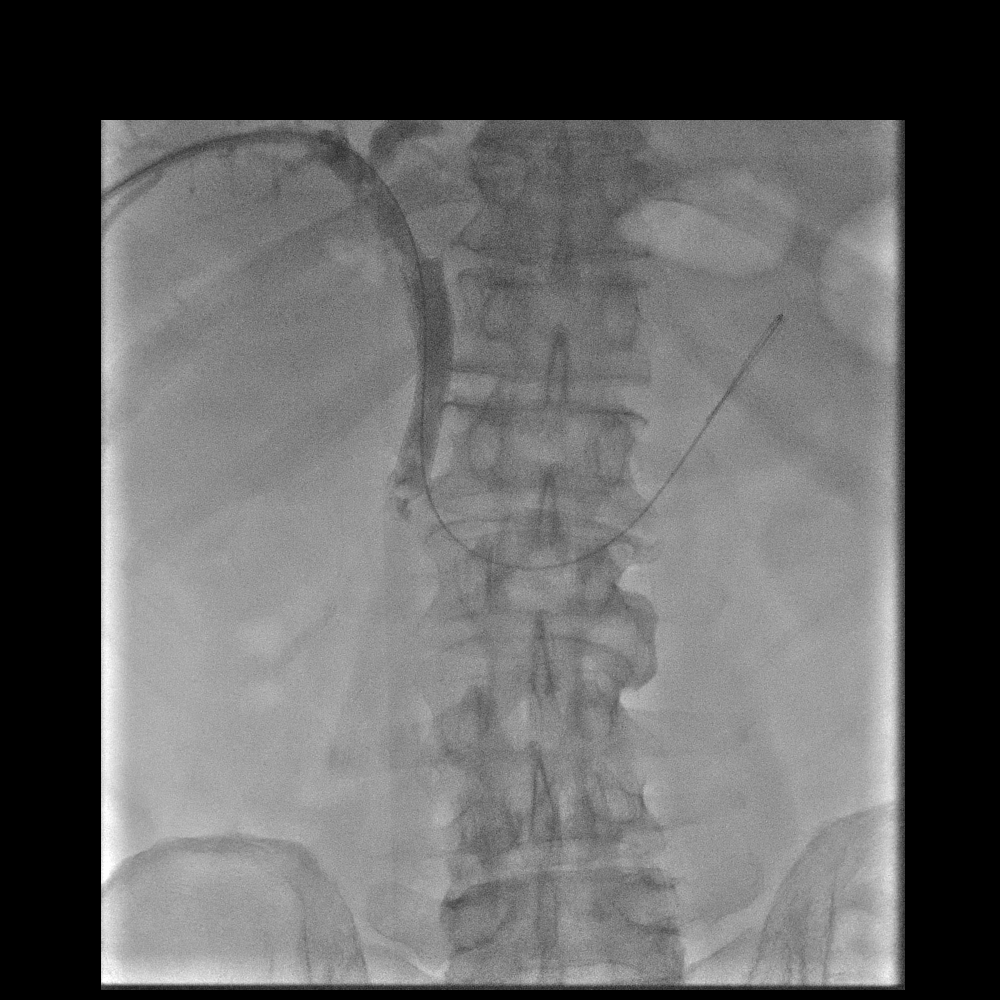

[4 of 4 positions shown; findings below may reference images not displayed]

EXAM:
CHOLANGIOGRAM VIA EXISTING CATHETER

MEDICATIONS:
None

ANESTHESIA/SEDATION:
None

FLUOROSCOPY TIME:  Fluoroscopy Time: 0 minutes 36 seconds (4 mGy).

COMPLICATIONS:
None immediate.

PROCEDURE:
Informed written consent was obtained from the patient after a
thorough discussion of the procedural risks, benefits and
alternatives. All questions were addressed. Maximal Sterile Barrier
Technique was utilized including caps, mask, sterile gowns, sterile
gloves, sterile drape, hand hygiene and skin antiseptic. A timeout
was performed prior to the initiation of the procedure.

The existing catheter was freed from the retention suture,
transected and removed over a Bentson wire. A 9 French vascular
sheath was advanced over the wire and positioned in the right
hepatic duct. A transhepatic cholangiogram was then performed.
Minimal biliary ductal dilatation. Contrast material passes freely
through the widely patent stent and into the duodenum. There may be
minimal sludge present.

The decision was made to proceed with drain removal. The wire and
sheath were removed. A bandage was placed over the access site.
IMPRESSION: Widely patent internal self expanding covered biliary stent. Given
toleration of capped trial and decreasing bilirubin, the decision
was made to remove the internal/external biliary drainage catheter.

Should patient developed recurrent symptoms of obstruction, the
stent should be approachable endoscopically in the future.

## 2020-12-11 MED ORDER — IOHEXOL 300 MG/ML  SOLN
20.0000 mL | Freq: Once | INTRAMUSCULAR | Status: AC | PRN
  Administered 2020-12-11: 10 mL

## 2020-12-11 MED ORDER — LIDOCAINE HCL 1 % IJ SOLN
INTRAMUSCULAR | Status: AC
  Filled 2020-12-11: qty 20

## 2020-12-11 NOTE — Progress Notes (Signed)
Received back from IR: No sedation.

## 2020-12-11 NOTE — Discharge Instructions (Signed)
Keep your dressing dry. You may shower tomorrow. Change your dressing daily. You may use a band aid when there is no more drainage on your gauze dressing.

## 2020-12-12 ENCOUNTER — Encounter (HOSPITAL_COMMUNITY): Payer: Self-pay

## 2020-12-12 ENCOUNTER — Other Ambulatory Visit: Payer: Self-pay

## 2020-12-12 ENCOUNTER — Emergency Department (HOSPITAL_COMMUNITY): Payer: Medicare Other

## 2020-12-12 ENCOUNTER — Inpatient Hospital Stay (HOSPITAL_COMMUNITY)
Admission: EM | Admit: 2020-12-12 | Discharge: 2020-12-23 | DRG: 444 | Disposition: A | Discharge status: Home / Self Care | Payer: Medicare Other | Attending: Internal Medicine | Admitting: Internal Medicine

## 2020-12-12 DIAGNOSIS — Z20822 Contact with and (suspected) exposure to covid-19: Secondary | ICD-10-CM | POA: Diagnosis present

## 2020-12-12 DIAGNOSIS — R197 Diarrhea, unspecified: Secondary | ICD-10-CM | POA: Diagnosis not present

## 2020-12-12 DIAGNOSIS — Z794 Long term (current) use of insulin: Secondary | ICD-10-CM

## 2020-12-12 DIAGNOSIS — C801 Malignant (primary) neoplasm, unspecified: Secondary | ICD-10-CM | POA: Diagnosis not present

## 2020-12-12 DIAGNOSIS — C259 Malignant neoplasm of pancreas, unspecified: Secondary | ICD-10-CM | POA: Diagnosis present

## 2020-12-12 DIAGNOSIS — C250 Malignant neoplasm of head of pancreas: Secondary | ICD-10-CM | POA: Diagnosis present

## 2020-12-12 DIAGNOSIS — Z7902 Long term (current) use of antithrombotics/antiplatelets: Secondary | ICD-10-CM | POA: Diagnosis not present

## 2020-12-12 DIAGNOSIS — E1165 Type 2 diabetes mellitus with hyperglycemia: Secondary | ICD-10-CM | POA: Diagnosis present

## 2020-12-12 DIAGNOSIS — K831 Obstruction of bile duct: Secondary | ICD-10-CM | POA: Diagnosis present

## 2020-12-12 DIAGNOSIS — Z8719 Personal history of other diseases of the digestive system: Secondary | ICD-10-CM

## 2020-12-12 DIAGNOSIS — D638 Anemia in other chronic diseases classified elsewhere: Secondary | ICD-10-CM | POA: Diagnosis present

## 2020-12-12 DIAGNOSIS — K820 Obstruction of gallbladder: Secondary | ICD-10-CM | POA: Diagnosis present

## 2020-12-12 DIAGNOSIS — I959 Hypotension, unspecified: Secondary | ICD-10-CM | POA: Diagnosis present

## 2020-12-12 DIAGNOSIS — Z79899 Other long term (current) drug therapy: Secondary | ICD-10-CM | POA: Diagnosis not present

## 2020-12-12 DIAGNOSIS — K819 Cholecystitis, unspecified: Secondary | ICD-10-CM

## 2020-12-12 DIAGNOSIS — I2510 Atherosclerotic heart disease of native coronary artery without angina pectoris: Secondary | ICD-10-CM | POA: Diagnosis present

## 2020-12-12 DIAGNOSIS — I10 Essential (primary) hypertension: Secondary | ICD-10-CM | POA: Diagnosis present

## 2020-12-12 DIAGNOSIS — Z8249 Family history of ischemic heart disease and other diseases of the circulatory system: Secondary | ICD-10-CM | POA: Diagnosis not present

## 2020-12-12 DIAGNOSIS — E785 Hyperlipidemia, unspecified: Secondary | ICD-10-CM | POA: Diagnosis present

## 2020-12-12 DIAGNOSIS — K810 Acute cholecystitis: Secondary | ICD-10-CM | POA: Diagnosis present

## 2020-12-12 DIAGNOSIS — E876 Hypokalemia: Secondary | ICD-10-CM | POA: Diagnosis present

## 2020-12-12 DIAGNOSIS — A419 Sepsis, unspecified organism: Secondary | ICD-10-CM

## 2020-12-12 DIAGNOSIS — N179 Acute kidney failure, unspecified: Secondary | ICD-10-CM | POA: Diagnosis present

## 2020-12-12 DIAGNOSIS — Z8042 Family history of malignant neoplasm of prostate: Secondary | ICD-10-CM | POA: Diagnosis not present

## 2020-12-12 DIAGNOSIS — Z806 Family history of leukemia: Secondary | ICD-10-CM

## 2020-12-12 DIAGNOSIS — E119 Type 2 diabetes mellitus without complications: Secondary | ICD-10-CM

## 2020-12-12 DIAGNOSIS — E872 Acidosis, unspecified: Secondary | ICD-10-CM | POA: Diagnosis present

## 2020-12-12 LAB — URINALYSIS, ROUTINE W REFLEX MICROSCOPIC
Glucose, UA: NEGATIVE mg/dL
Hgb urine dipstick: NEGATIVE
Ketones, ur: NEGATIVE mg/dL
Leukocytes,Ua: NEGATIVE
Nitrite: NEGATIVE
Protein, ur: 100 mg/dL — AB
Specific Gravity, Urine: 1.03 (ref 1.005–1.030)
pH: 6 (ref 5.0–8.0)

## 2020-12-12 LAB — CBC WITH DIFFERENTIAL/PLATELET
Abs Immature Granulocytes: 0.22 10*3/uL — ABNORMAL HIGH (ref 0.00–0.07)
Basophils Absolute: 0.1 10*3/uL (ref 0.0–0.1)
Basophils Relative: 0 %
Eosinophils Absolute: 0 10*3/uL (ref 0.0–0.5)
Eosinophils Relative: 0 %
HCT: 30.8 % — ABNORMAL LOW (ref 39.0–52.0)
Hemoglobin: 10.1 g/dL — ABNORMAL LOW (ref 13.0–17.0)
Immature Granulocytes: 1 %
Lymphocytes Relative: 9 %
Lymphs Abs: 1.5 10*3/uL (ref 0.7–4.0)
MCH: 30.4 pg (ref 26.0–34.0)
MCHC: 32.8 g/dL (ref 30.0–36.0)
MCV: 92.8 fL (ref 80.0–100.0)
Monocytes Absolute: 0.8 10*3/uL (ref 0.1–1.0)
Monocytes Relative: 5 %
Neutro Abs: 13.4 10*3/uL — ABNORMAL HIGH (ref 1.7–7.7)
Neutrophils Relative %: 85 %
Platelets: 308 10*3/uL (ref 150–400)
RBC: 3.32 MIL/uL — ABNORMAL LOW (ref 4.22–5.81)
RDW: 16.7 % — ABNORMAL HIGH (ref 11.5–15.5)
WBC: 16 10*3/uL — ABNORMAL HIGH (ref 4.0–10.5)
nRBC: 0 % (ref 0.0–0.2)

## 2020-12-12 LAB — COMPREHENSIVE METABOLIC PANEL
ALT: 46 U/L — ABNORMAL HIGH (ref 0–44)
AST: 35 U/L (ref 15–41)
Albumin: 2.6 g/dL — ABNORMAL LOW (ref 3.5–5.0)
Alkaline Phosphatase: 122 U/L (ref 38–126)
Anion gap: 16 — ABNORMAL HIGH (ref 5–15)
BUN: 31 mg/dL — ABNORMAL HIGH (ref 8–23)
CO2: 20 mmol/L — ABNORMAL LOW (ref 22–32)
Calcium: 8.9 mg/dL (ref 8.9–10.3)
Chloride: 99 mmol/L (ref 98–111)
Creatinine, Ser: 1.44 mg/dL — ABNORMAL HIGH (ref 0.61–1.24)
GFR, Estimated: 52 mL/min — ABNORMAL LOW (ref >60)
Glucose, Bld: 259 mg/dL — ABNORMAL HIGH (ref 70–99)
Potassium: 3.1 mmol/L — ABNORMAL LOW (ref 3.5–5.1)
Sodium: 135 mmol/L (ref 135–145)
Total Bilirubin: 4 mg/dL — ABNORMAL HIGH (ref 0.3–1.2)
Total Protein: 6.9 g/dL (ref 6.5–8.1)

## 2020-12-12 LAB — LACTIC ACID, PLASMA
Lactic Acid, Venous: 1.7 mmol/L (ref 0.5–1.9)
Lactic Acid, Venous: 1.2 mmol/L (ref 0.5–1.9)

## 2020-12-12 LAB — GLUCOSE, CAPILLARY
Glucose-Capillary: 207 mg/dL — ABNORMAL HIGH (ref 70–99)
Glucose-Capillary: 153 mg/dL — ABNORMAL HIGH (ref 70–99)

## 2020-12-12 LAB — RESP PANEL BY RT-PCR (FLU A&B, COVID) ARPGX2
Influenza A by PCR: NEGATIVE
Influenza B by PCR: NEGATIVE
SARS Coronavirus 2 by RT PCR: NEGATIVE

## 2020-12-12 LAB — MAGNESIUM: Magnesium: 1.8 mg/dL (ref 1.7–2.4)

## 2020-12-12 LAB — LIPASE, BLOOD: Lipase: 16 U/L (ref 11–51)

## 2020-12-12 IMAGING — DX DG CHEST 1V PORT
1 series · 1 of 1 positions shown · non-contrast
Comparison: Chest CTA [DATE].

CLINICAL DATA: 70-year-old male with abnormal pulmonary
auscultation in the right lower lung. Abdominal pain following
removal of internal/external biliary drain yesterday.

EXAM:
PORTABLE CHEST 1 VIEW

[chest ap]
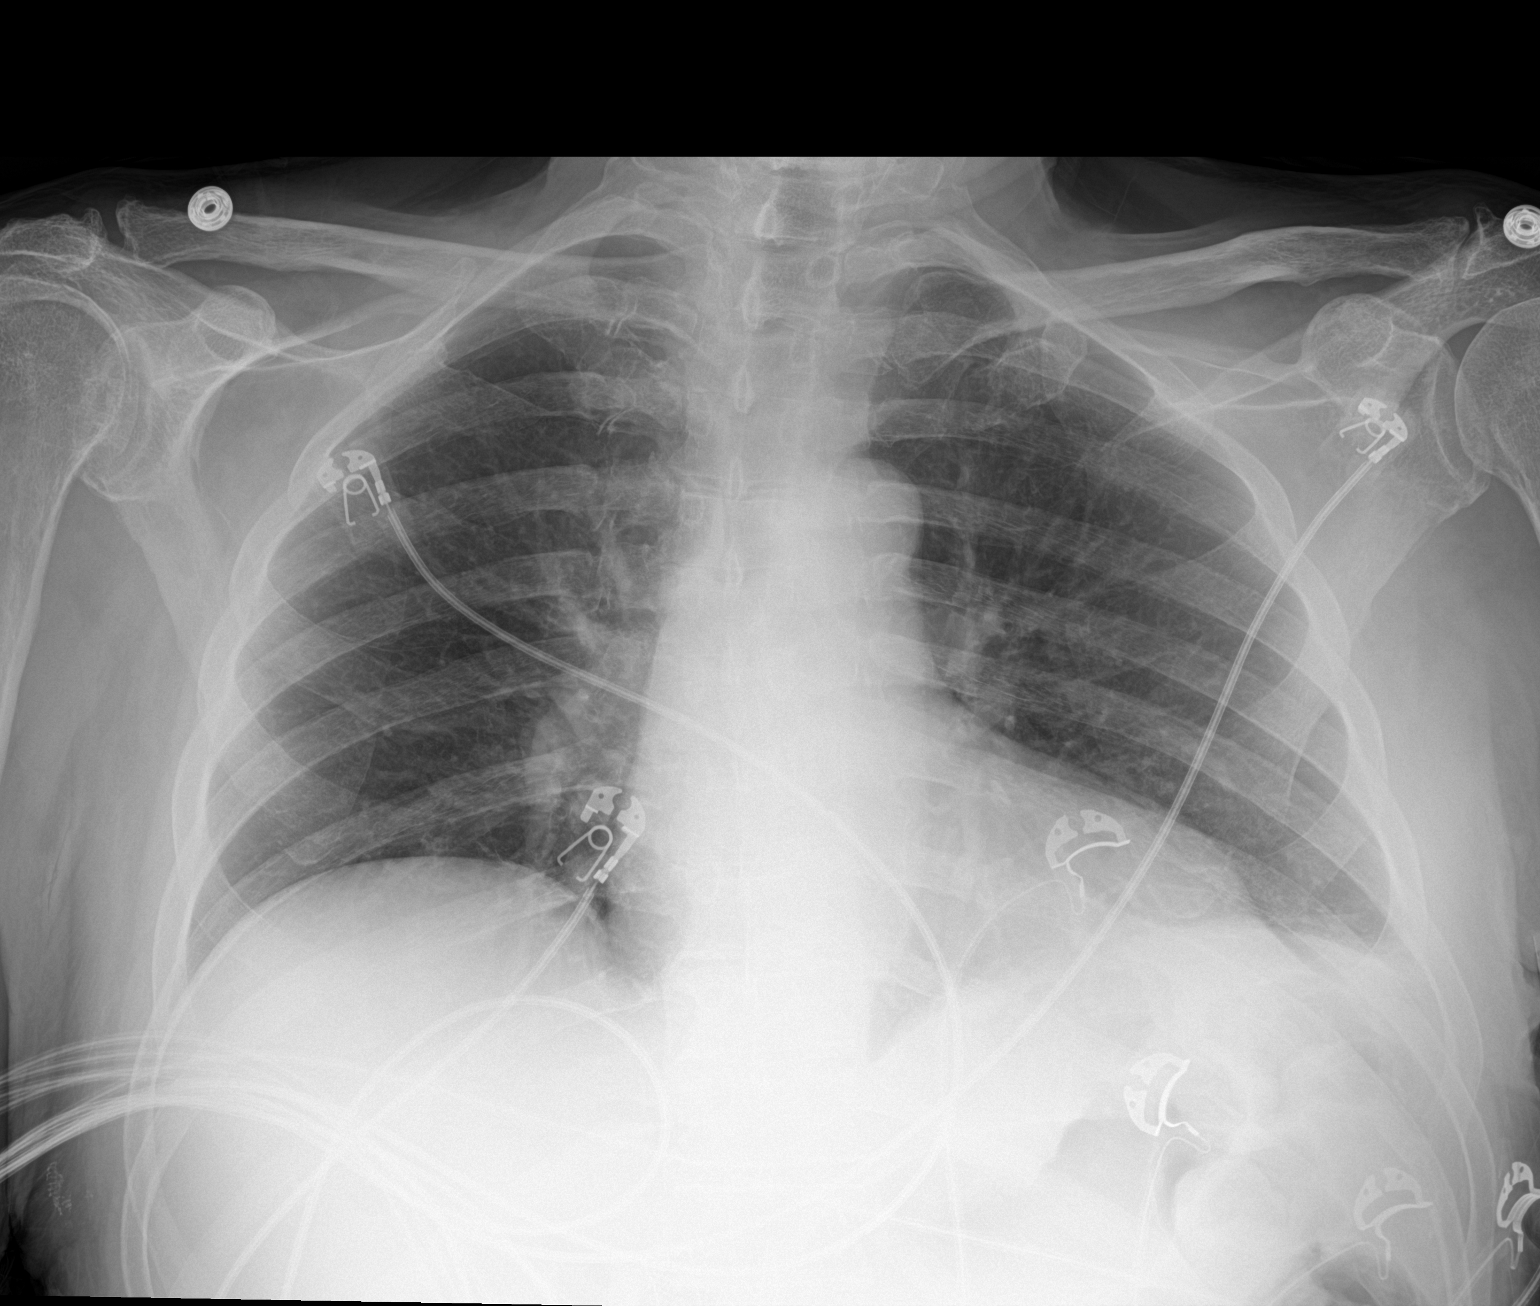

[1 of 1 positions shown; findings below may reference images not displayed]

FINDINGS: Portable AP semi upright view at [UG] hours. Lower lung volumes.
Stable mild elevation of the right hemidiaphragm. Normal cardiac
size and mediastinal contours. Visualized tracheal air column is
within normal limits. Allowing for portable technique the lungs are
clear. No pneumothorax. Negative visible bowel gas pattern. No acute
osseous abnormality identified.
IMPRESSION: Lower lung volumes.  No acute cardiopulmonary abnormality.

## 2020-12-12 IMAGING — CT CT ABD-PELV W/ CM
2 of 5 series · 13 of 46 positions shown, 15 images · IV contrast (OMNIPAQUE 350)
Comparison: CT Abdomen and Pelvis [DATE] and earlier.
COMPARISON: CT Abdomen and Pelvis [DATE] and earlier.

Addendum:
CLINICAL DATA: 70-year-old male status post removal of transhepatic
internal/external biliary drainage catheter yesterday. Underlying
malignant obstructed jaundice secondary to pancreatic cancer.
Indwelling metal biliary stent.

EXAM:
CT ABDOMEN AND PELVIS WITH CONTRAST
TECHNIQUE: Multidetector CT imaging of the abdomen and pelvis was performed
using the standard protocol following bolus administration of
intravenous contrast.
CONTRAST:  80mL OMNIPAQUE IOHEXOL 350 MG/ML SOLN

[Series 2: axial st · axial · 0.93mm/px · z∈[+862,+1282]mm · 10 of 100 slices shown, 12 images]
[im 8/100  soft-tissue]
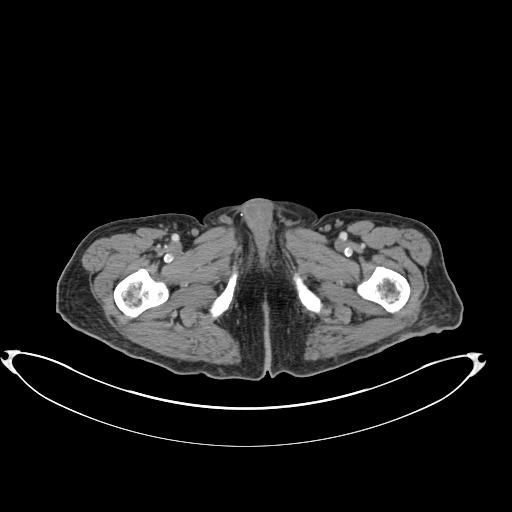
[im 8/100  bone]
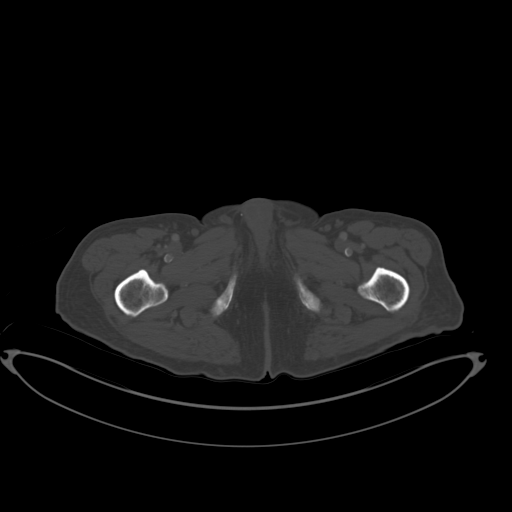
[im 15/100  soft-tissue]
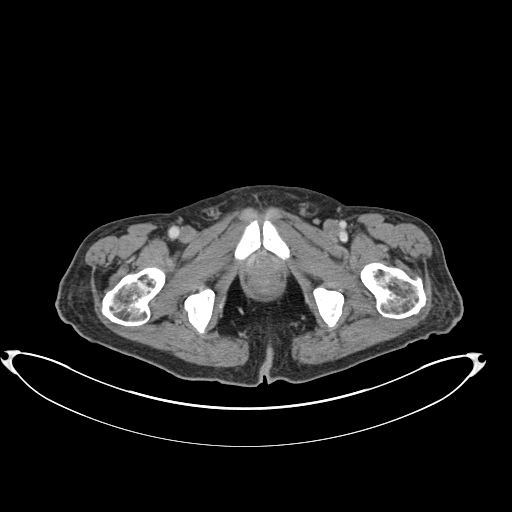
[im 29/100  soft-tissue]
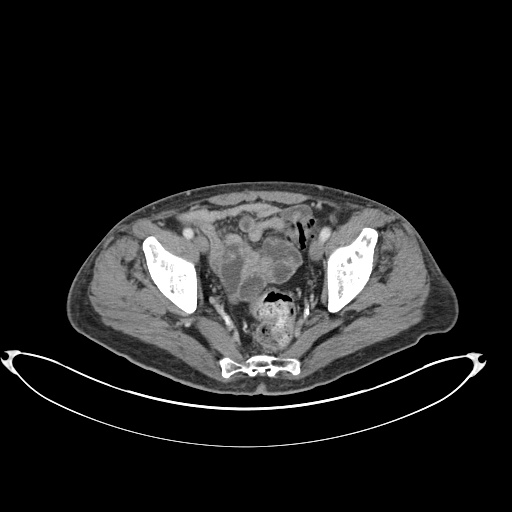
[im 36/100  soft-tissue]
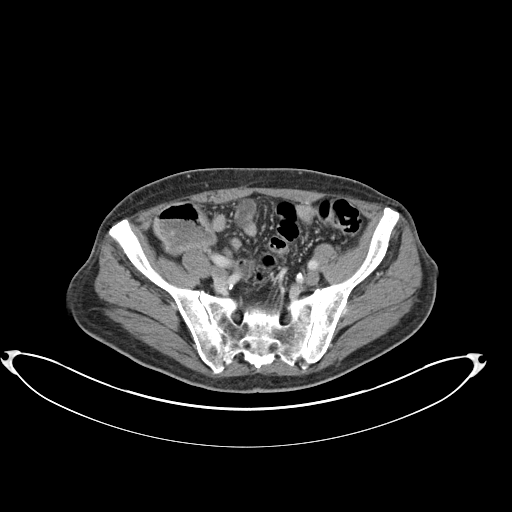
[im 43/100  soft-tissue]
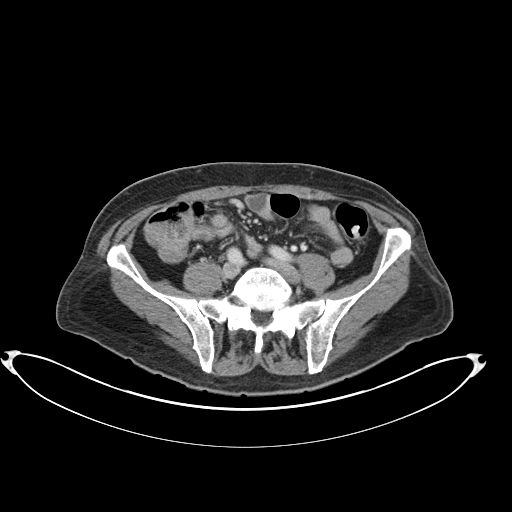
[im 57/100  soft-tissue]
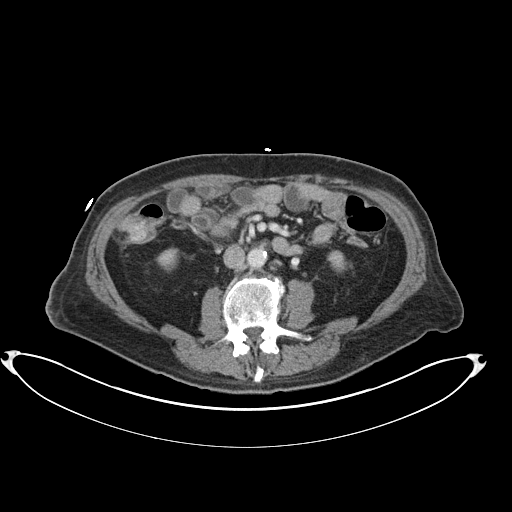
[im 64/100  soft-tissue]
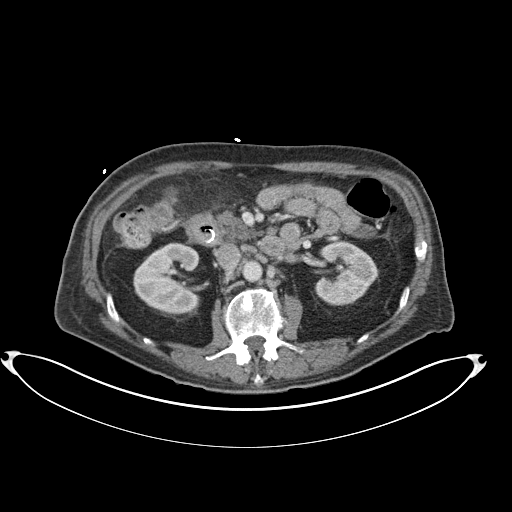
[im 71/100  soft-tissue]
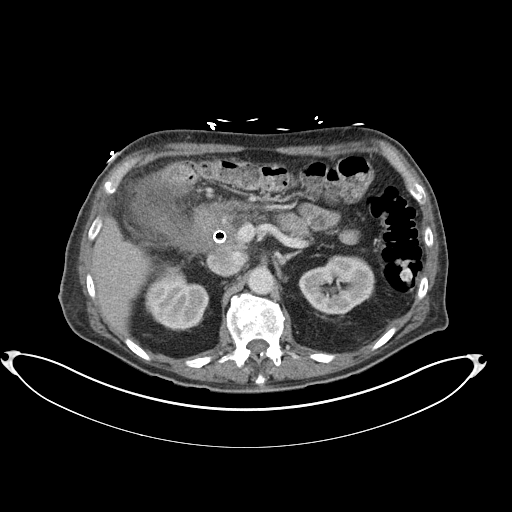
[im 85/100  soft-tissue]
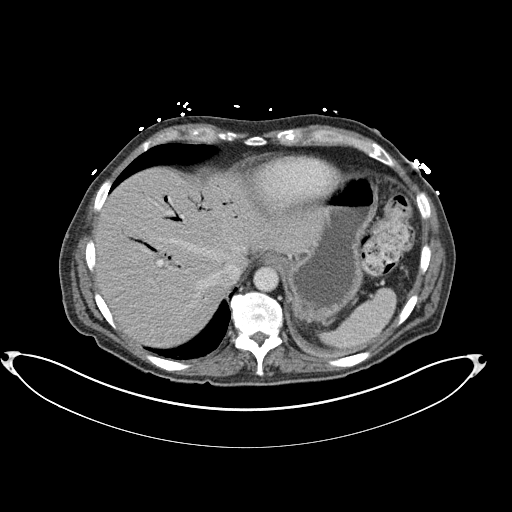
[im 85/100  bone]
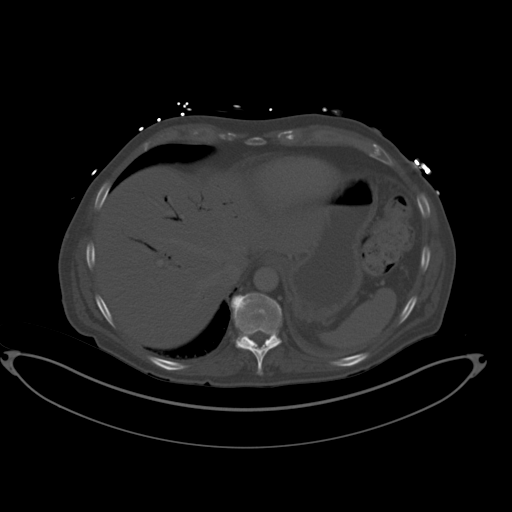
[im 92/100  soft-tissue]
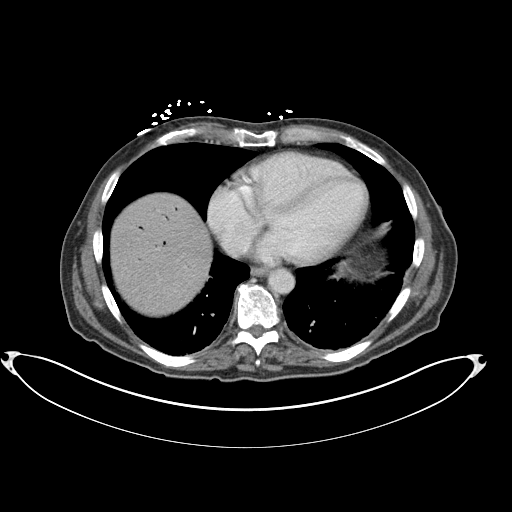

[Series 5: coronal st · coronal · 0.83mm/px · 3 of 149 slices shown]
[im 50/149  soft-tissue]
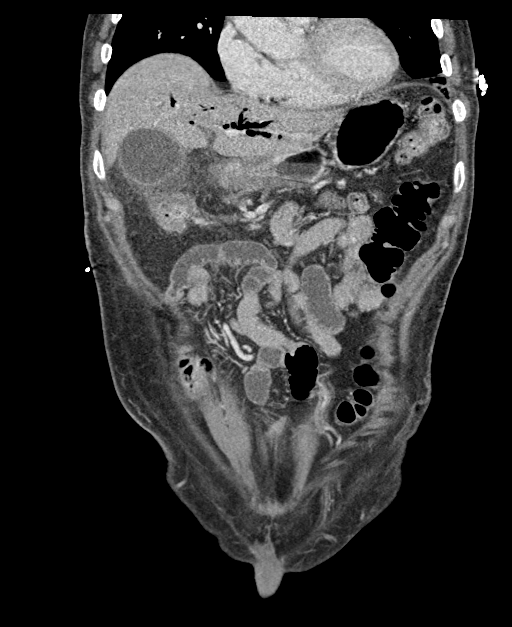
[im 66/149  soft-tissue]
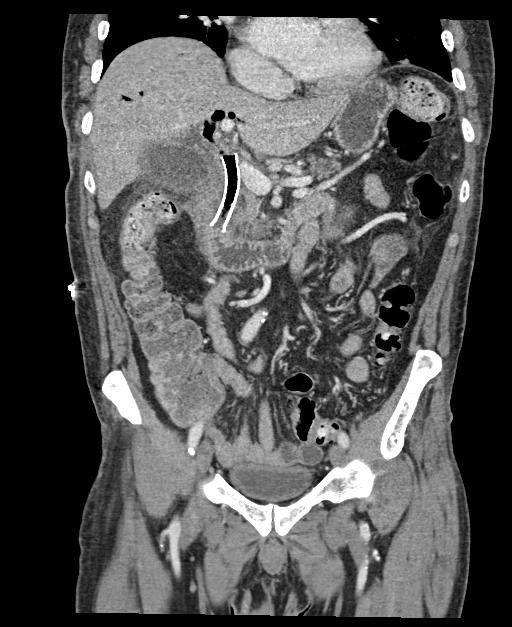
[im 83/149  soft-tissue]
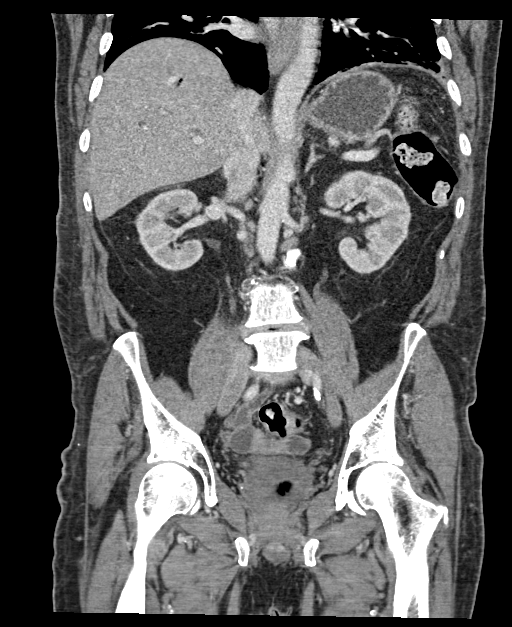

[13 of 46 positions shown; findings below may reference images not displayed]

FINDINGS: Lower chest: Minor atelectasis, greater on the left. Otherwise
negative.

Hepatobiliary: Mildly dilated and inflamed gallbladder with
confluent regional soft tissue stranding extending to the porta
hepatis (series 2, image 28). Gallbladder wall thickening. But there
is also gas within the gallbladder lumen, similar to diffuse
pneumobilia elsewhere. And the metallic CBD stent also contains gas
and appears to be patent terminating in the duodenum.

Subtle former drainage tract through the liver is identified with no
adverse features. No perihepatic free fluid. Liver enhancement
maintained.

Pancreas: Spiculated pancreatic head mass with upstream ductal
dilatation and pancreatic atrophy.

Spleen: Negative.

Adrenals/Urinary Tract: Negative. Symmetric renal enhancement and
early contrast excretion.

Stomach/Bowel: No dilated large or small bowel. Secondary
inflammation of the hepatic flexure appears related to the
gallbladder on coronal image 49. Appendix is normal on series 2,
image 64. Decompressed and negative terminal ileum. No free air.

The distal stomach and duodenum also appear actively inflamed at the
porta hepatis. The distal duodenum appear spared. There is only mild
gas and fluid distention of the stomach.

Vascular/Lymphatic: Aortoiliac calcified atherosclerosis. Major
arterial structures remain patent. The portal venous system remains
patent. Small but numerous peripancreatic lymph nodes appear stable
from earlier this month.

Reproductive: Negative.

Other: No pelvic free fluid.

Musculoskeletal: Stable. No acute or suspicious osseous lesion
identified.
IMPRESSION: 1. Positive for Acute Cholecystitis, with confluent regional
inflammation secondarily involving the gastric antrum, duodenum, and
hepatic flexure of colon.
But the metallic CBD stent appears patent with no adverse features.
And there is pneumobilia within the gallbladder lumen.

2. Transhepatic biliary drain removed with no adverse features along
the former drainage tract.

3. Known Pancreatic Tumor.

ADDENDUM:
Study discussed by telephone with PA DABILA on [DATE]
at [DATE]. We discussed conservative treatment including antibiotics
and hydration. But the [REDACTED] at [HOSPITAL]
is being updated on Mr. DABILA situation.

*** End of Addendum ***
FINDINGS: Lower chest: Minor atelectasis, greater on the left. Otherwise
negative.

Hepatobiliary: Mildly dilated and inflamed gallbladder with
confluent regional soft tissue stranding extending to the porta
hepatis (series 2, image 28). Gallbladder wall thickening. But there
is also gas within the gallbladder lumen, similar to diffuse
pneumobilia elsewhere. And the metallic CBD stent also contains gas
and appears to be patent terminating in the duodenum.

Subtle former drainage tract through the liver is identified with no
adverse features. No perihepatic free fluid. Liver enhancement
maintained.

Pancreas: Spiculated pancreatic head mass with upstream ductal
dilatation and pancreatic atrophy.

Spleen: Negative.

Adrenals/Urinary Tract: Negative. Symmetric renal enhancement and
early contrast excretion.

Stomach/Bowel: No dilated large or small bowel. Secondary
inflammation of the hepatic flexure appears related to the
gallbladder on coronal image 49. Appendix is normal on series 2,
image 64. Decompressed and negative terminal ileum. No free air.

The distal stomach and duodenum also appear actively inflamed at the
porta hepatis. The distal duodenum appear spared. There is only mild
gas and fluid distention of the stomach.

Vascular/Lymphatic: Aortoiliac calcified atherosclerosis. Major
arterial structures remain patent. The portal venous system remains
patent. Small but numerous peripancreatic lymph nodes appear stable
from earlier this month.

Reproductive: Negative.

Other: No pelvic free fluid.

Musculoskeletal: Stable. No acute or suspicious osseous lesion
identified.
IMPRESSION: 1. Positive for Acute Cholecystitis, with confluent regional
inflammation secondarily involving the gastric antrum, duodenum, and
hepatic flexure of colon.
But the metallic CBD stent appears patent with no adverse features.
And there is pneumobilia within the gallbladder lumen.

2. Transhepatic biliary drain removed with no adverse features along
the former drainage tract.

3. Known Pancreatic Tumor.

## 2020-12-12 MED ORDER — INSULIN GLARGINE 100 UNIT/ML ~~LOC~~ SOLN
9.0000 [IU] | Freq: Every day | SUBCUTANEOUS | Status: DC
  Administered 2020-12-12: 9 [IU] via SUBCUTANEOUS
  Filled 2020-12-12 (×2): qty 0.09

## 2020-12-12 MED ORDER — METRONIDAZOLE 500 MG/100ML IV SOLN
500.0000 mg | Freq: Once | INTRAVENOUS | Status: AC
  Administered 2020-12-12: 500 mg via INTRAVENOUS
  Filled 2020-12-12: qty 100

## 2020-12-12 MED ORDER — TRAZODONE HCL 50 MG PO TABS
50.0000 mg | ORAL_TABLET | Freq: Once | ORAL | Status: AC
Start: 2020-12-13 — End: 2020-12-12
  Administered 2020-12-12: 50 mg via ORAL
  Filled 2020-12-12: qty 1

## 2020-12-12 MED ORDER — SODIUM CHLORIDE 0.9 % IV SOLN: 2.0000 g | Freq: Two times a day (BID) | INTRAVENOUS | Status: DC

## 2020-12-12 MED ORDER — POTASSIUM CHLORIDE IN NACL 20-0.9 MEQ/L-% IV SOLN
INTRAVENOUS | Status: DC
Start: 2020-12-12 — End: 2020-12-18
  Filled 2020-12-12 (×15): qty 1000

## 2020-12-12 MED ORDER — HYDROMORPHONE HCL 2 MG/ML IJ SOLN
INTRAMUSCULAR | Status: AC
  Administered 2020-12-12: 2 mg
  Filled 2020-12-12: qty 1

## 2020-12-12 MED ORDER — ATORVASTATIN CALCIUM 40 MG PO TABS
40.0000 mg | ORAL_TABLET | Freq: Every day | ORAL | Status: DC
  Administered 2020-12-12 – 2020-12-23 (×12): 40 mg via ORAL
  Filled 2020-12-12 (×12): qty 1

## 2020-12-12 MED ORDER — CEFTRIAXONE SODIUM 2 G IJ SOLR
2.0000 g | Freq: Once | INTRAMUSCULAR | Status: AC
  Administered 2020-12-12: 2 g via INTRAVENOUS
  Filled 2020-12-12: qty 20

## 2020-12-12 MED ORDER — PIPERACILLIN-TAZOBACTAM 3.375 G IVPB
3.3750 g | Freq: Three times a day (TID) | INTRAVENOUS | Status: AC
  Administered 2020-12-12 – 2020-12-22 (×31): 3.375 g via INTRAVENOUS
  Filled 2020-12-12 (×32): qty 50

## 2020-12-12 MED ORDER — SODIUM CHLORIDE 0.9 % IV BOLUS
1000.0000 mL | Freq: Once | INTRAVENOUS | Status: AC
  Administered 2020-12-12: 1000 mL via INTRAVENOUS

## 2020-12-12 MED ORDER — HYDROMORPHONE HCL 1 MG/ML IJ SOLN
1.0000 mg | INTRAMUSCULAR | Status: DC | PRN
  Administered 2020-12-13 – 2020-12-15 (×2): 2 mg via INTRAVENOUS
  Administered 2020-12-15: 1 mg via INTRAVENOUS
  Administered 2020-12-17 – 2020-12-18 (×4): 2 mg via INTRAVENOUS
  Filled 2020-12-12: qty 2
  Filled 2020-12-12: qty 1
  Filled 2020-12-12 (×5): qty 2
  Filled 2020-12-12 (×2): qty 1

## 2020-12-12 MED ORDER — IOHEXOL 350 MG/ML SOLN
80.0000 mL | Freq: Once | INTRAVENOUS | Status: AC | PRN
  Administered 2020-12-12: 80 mL via INTRAVENOUS

## 2020-12-12 MED ORDER — METRONIDAZOLE 500 MG/100ML IV SOLN: 500.0000 mg | Freq: Three times a day (TID) | INTRAVENOUS | Status: DC

## 2020-12-12 MED ORDER — MAGNESIUM SULFATE IN D5W 1-5 GM/100ML-% IV SOLN
1.0000 g | Freq: Once | INTRAVENOUS | Status: AC
  Administered 2020-12-12: 1 g via INTRAVENOUS
  Filled 2020-12-12: qty 100

## 2020-12-12 MED ORDER — LACTATED RINGERS IV SOLN: INTRAVENOUS | Status: DC

## 2020-12-12 MED ORDER — INSULIN ASPART 100 UNIT/ML IJ SOLN
0.0000 [IU] | Freq: Three times a day (TID) | INTRAMUSCULAR | Status: DC
  Administered 2020-12-12: 5 [IU] via SUBCUTANEOUS
  Administered 2020-12-13: 3 [IU] via SUBCUTANEOUS
  Administered 2020-12-13: 2 [IU] via SUBCUTANEOUS
  Administered 2020-12-13: 3 [IU] via SUBCUTANEOUS
  Administered 2020-12-14 – 2020-12-15 (×3): 2 [IU] via SUBCUTANEOUS
  Administered 2020-12-16: 3 [IU] via SUBCUTANEOUS
  Administered 2020-12-18 (×2): 2 [IU] via SUBCUTANEOUS
  Administered 2020-12-18: 3 [IU] via SUBCUTANEOUS
  Administered 2020-12-19 – 2020-12-20 (×3): 2 [IU] via SUBCUTANEOUS
  Administered 2020-12-20 – 2020-12-21 (×2): 3 [IU] via SUBCUTANEOUS
  Administered 2020-12-22 – 2020-12-23 (×3): 2 [IU] via SUBCUTANEOUS
  Filled 2020-12-12: qty 0.15

## 2020-12-12 MED ORDER — TRAMADOL HCL 50 MG PO TABS
50.0000 mg | ORAL_TABLET | ORAL | Status: DC | PRN
Start: 2020-12-12 — End: 2020-12-14
  Administered 2020-12-12 – 2020-12-14 (×4): 50 mg via ORAL
  Filled 2020-12-12 (×4): qty 1

## 2020-12-12 NOTE — Progress Notes (Signed)
Pharmacy Antibiotic Note  Francisco Frank is a 70 y.o. male admitted on 12/12/2020 with  abdominal pain, nausea, and vomiting - found to have cholecystitis .  Patient with diagnosis of pancreatic cancer, has not started chemotherapy as of yet.  S/p multiple recent biliary procedures and currently has biliary stent.  Pharmacy has been consulted for Zosyn dosing.  Original consult for cefepime, discussed w/ PA and will change to zosyn to provide anaerobic coverage.   Plan: Zosyn 3.375g IV q8 hours  F/u CCS recommendations F/u culture data and ability to narrow abx    Temp (24hrs), Avg:98.6 F (37 C), Min:98.3 F (36.8 C), Max:98.8 F (37.1 C)  Recent Labs  Lab 12/10/20 1309 12/12/20 0653 12/12/20 0950  WBC 20.0* 16.0*  --   CREATININE 1.07 1.44*  --   LATICACIDVEN  --  1.7 1.2    Estimated Creatinine Clearance: 45.4 mL/min (A) (by C-G formula based on SCr of 1.44 mg/dL (H)).    Allergies  Allergen Reactions   Norco [Hydrocodone-Acetaminophen] Itching    Antimicrobials this admission: CTX x1 Flagyl x1 Zosyn 9/23 >>   Dose adjustments this admission: N/A  Microbiology results: 9/23 BCx:  9/23 UCx:    Thank you for allowing pharmacy to be a part of this patient's care.  Dimple Nanas, PharmD 12/12/2020 11:43 AM

## 2020-12-12 NOTE — Progress Notes (Signed)
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 He presented with painless jaundice and was unable to have a stent placed for decompression by GI due to inability to cannulate his CBD.  He was referred to IR who placed a metal stent in his CBD.  His jaundice failed to improve and his stent was found to have debris and sludge occluding this.  He then had an I/E drain placed by IR on 9/9.  His TB has begun to trend down.  He saw oncology this past Wednesday and he was referred back to Dr. Zenia Resides for [facility] placement so he can begin chemo on 10/5.  After he left, he went to go out to eat and tolerated this well.  Later that day he developed chills and then had one episode of vomiting.  No new abdominal pain specifically at that time.  The following day, yesterday, he followed up with IR and complained that his I/E drain was causing him pain and it felt like it was "poking" his insides.  He has this evaluated yesterday and the drain was removed.  After this his pain across his abdomen acutely worsened.  He complained of 8-10/10 burning pain in his epigastrium especially after drinking.  This would last for 20-30secs and then stop and several minutes later, start again.  His other "chronic" type abdominal pain is still there, but doesn't hurt to touch his abdomen like this pain did.  He failed to sleep well secondary to some of this pain and presented to the ED today.  His WBC is 16K, Cr 1.44, TB down to 4, and a CT scan that shows air in the gallbladder with extensive inflammation in the RUQ and cholecystitis could not be ruled out.  We have been asked to see for further evaluation and recommendations.  ROS: See above, otherwise other systems negative  Objective: Vital signs in last 24 hours: Temp:  [98.3 F (36.8 C)-98.8 F (37.1 C)] 98.8 F (37.1 C) (09/23 0516) Pulse Rate:  [61-86] 64 (09/23 1230) Resp:  [11-20] 12 (09/23 1230) BP: (63-143)/(49-89) 107/66 (09/23  1230) SpO2:  [95 %-100 %] 96 % (09/23 1230)    Intake/Output from previous day: No intake/output data recorded. Intake/Output this shift: No intake/output data recorded.  PE: General: pleasant, WD, WN white male who is laying in bed in NAD HEENT: head is normocephalic, atraumatic.  Sclera are noninjected, but minimally icteric.  PERRL.  Ears and nose without any masses or lesions.  Mouth is pink and moist Heart: regular, rate, and rhythm.  Normal s1,s2. No obvious murmurs, gallops, or rubs noted.  Palpable radial and pedal pulses bilaterally Lungs: CTAB, no wheezes, rhonchi, or rales noted.  Respiratory effort nonlabored Abd: soft, currently not tender in his abdomen to palpation.  Drain site has healed in RUQ. ND, +BS, no masses, hernias, or organomegaly MS: all 4 extremities are symmetrical with no cyanosis, clubbing, or edema. Skin: warm and dry with no masses, lesions, or rashes.  Fading jaundice Neuro: Cranial nerves 2-12 grossly intact, sensation is normal throughout Psych: A&Ox3 with an appropriate affect.   Lab Results:  Recent Labs    12/10/20 1309 12/12/20 0653  WBC 20.0* 16.0*  HGB 11.2* 10.1*  HCT 33.6* 30.8*  PLT 327 308   BMET Recent Labs    12/10/20 1309 12/12/20 0653  NA 137 135  K 3.7 3.1*  CL 103 99  CO2 24 20*  GLUCOSE 232*  259*  BUN 19 31*  CREATININE 1.07 1.44*  CALCIUM 9.6 8.9   PT/INR No results for input(s): LABPROT, INR in the last 72 hours. CMP     Component Value Date/Time   NA 135 12/12/2020 0653   K 3.1 (L) 12/12/2020 0653   CL 99 12/12/2020 0653   CO2 20 (L) 12/12/2020 0653   GLUCOSE 259 (H) 12/12/2020 0653   BUN 31 (H) 12/12/2020 0653   CREATININE 1.44 (H) 12/12/2020 0653   CREATININE 1.07 12/10/2020 1309   CALCIUM 8.9 12/12/2020 0653   PROT 6.9 12/12/2020 0653   ALBUMIN 2.6 (L) 12/12/2020 0653   AST 35 12/12/2020 0653   AST 41 12/10/2020 1309   ALT 46 (H) 12/12/2020 0653   ALT 57 (H) 12/10/2020 1309   ALKPHOS 122  12/12/2020 0653   BILITOT 4.0 (H) 12/12/2020 0653   BILITOT 4.9 (HH) 12/10/2020 1309   GFRNONAA 52 (L) 12/12/2020 0653   GFRNONAA >60 12/10/2020 1309   Lipase     Component Value Date/Time   LIPASE 16 12/12/2020 0653       Studies/Results: CT ABDOMEN PELVIS W CONTRAST  Addendum Date: 12/12/2020   ADDENDUM REPORT: 12/12/2020 09:21 ADDENDUM: Study discussed by telephone with PA Deno Etienne on 12/12/2020 at 09:19. We discussed conservative treatment including antibiotics and hydration. But the Interventional Radiology team at [facility] is being updated on Mr. Albornoz situation. Electronically Signed   By: Genevie Ann M.D.   On: 12/12/2020 09:21   Result Date: 12/12/2020 CLINICAL DATA:  70 year old male status post removal of transhepatic internal/external biliary drainage catheter yesterday. Underlying malignant obstructed jaundice secondary to pancreatic cancer. Indwelling metal biliary stent. EXAM: CT ABDOMEN AND PELVIS WITH CONTRAST TECHNIQUE: Multidetector CT imaging of the abdomen and pelvis was performed using the standard protocol following bolus administration of intravenous contrast. CONTRAST:  65mL OMNIPAQUE IOHEXOL 350 MG/ML SOLN COMPARISON:  CT Abdomen and Pelvis 11/28/2020 and earlier. FINDINGS: Lower chest: Minor atelectasis, greater on the left. Otherwise negative. Hepatobiliary: Mildly dilated and inflamed gallbladder with confluent regional soft tissue stranding extending to the porta hepatis (series 2, image 28). Gallbladder wall thickening. But there is also gas within the gallbladder lumen, similar to diffuse pneumobilia elsewhere. And the metallic CBD stent also contains gas and appears to be patent terminating in the duodenum. Subtle former drainage tract through the liver is identified with no adverse features. No perihepatic free fluid. Liver enhancement maintained. Pancreas: Spiculated pancreatic head mass with upstream ductal dilatation and pancreatic atrophy. Spleen:  Negative. Adrenals/Urinary Tract: Negative. Symmetric renal enhancement and early contrast excretion. Stomach/Bowel: No dilated large or small bowel. Secondary inflammation of the hepatic flexure appears related to the gallbladder on coronal image 49. Appendix is normal on series 2, image 64. Decompressed and negative terminal ileum. No free air. The distal stomach and duodenum also appear actively inflamed at the porta hepatis. The distal duodenum appear spared. There is only mild gas and fluid distention of the stomach. Vascular/Lymphatic: Aortoiliac calcified atherosclerosis. Major arterial structures remain patent. The portal venous system remains patent. Small but numerous peripancreatic lymph nodes appear stable from earlier this month. Reproductive: Negative. Other: No pelvic free fluid. Musculoskeletal: Stable. No acute or suspicious osseous lesion identified. IMPRESSION: 1. Positive for Acute Cholecystitis, with confluent regional inflammation secondarily involving the gastric antrum, duodenum, and hepatic flexure of colon. But the metallic CBD stent appears patent with no adverse features. And there is pneumobilia within the gallbladder lumen. 2. Transhepatic biliary drain removed with no  adverse features along the former drainage tract. 3. Known Pancreatic Tumor. Electronically Signed: By: Genevie Ann M.D. On: 12/12/2020 09:07   DG Chest Port 1 View  Result Date: 12/12/2020 CLINICAL DATA:  70 year old male with abnormal pulmonary auscultation in the right lower lung. Abdominal pain following removal of internal/external biliary drain yesterday. EXAM: PORTABLE CHEST 1 VIEW COMPARISON:  Chest CTA 08/08/2020. FINDINGS: Portable AP semi upright view at 0832 hours. Lower lung volumes. Stable mild elevation of the right hemidiaphragm. Normal cardiac size and mediastinal contours. Visualized tracheal air column is within normal limits. Allowing for portable technique the lungs are clear. No pneumothorax.  Negative visible bowel gas pattern. No acute osseous abnormality identified. IMPRESSION: Lower lung volumes.  No acute cardiopulmonary abnormality. Electronically Signed   By: Genevie Ann M.D.   On: 12/12/2020 08:56   IR CHOLANGIOGRAM EXISTING TUBE  Result Date: 12/11/2020 INDICATION: 70 year old male with malignant obstructed jaundice secondary to pancreatic cancer. He has an internal metallic stent as well as an internal/external biliary drainage catheter which has been capped. He is tolerating the capped catheter well. His bilirubin continues to trend down and is the lowest it has been since the time of stent placement. He finds the internal/external drainage catheter very painful. He presents today for transhepatic cholangiogram and possible drain removal. EXAM: CHOLANGIOGRAM VIA EXISTING CATHETER MEDICATIONS: None ANESTHESIA/SEDATION: None FLUOROSCOPY TIME:  Fluoroscopy Time: 0 minutes 36 seconds (4 mGy). COMPLICATIONS: None immediate. PROCEDURE: Informed written consent was obtained from the patient after a thorough discussion of the procedural risks, benefits and alternatives. All questions were addressed. Maximal Sterile Barrier Technique was utilized including caps, mask, sterile gowns, sterile gloves, sterile drape, hand hygiene and skin antiseptic. A timeout was performed prior to the initiation of the procedure. The existing catheter was freed from the retention suture, transected and removed over a Bentson wire. A 9 French vascular sheath was advanced over the wire and positioned in the right hepatic duct. A transhepatic cholangiogram was then performed. Minimal biliary ductal dilatation. Contrast material passes freely through the widely patent stent and into the duodenum. There may be minimal sludge present. The decision was made to proceed with drain removal. The wire and sheath were removed. A bandage was placed over the access site. IMPRESSION: Widely patent internal self expanding covered biliary  stent. Given toleration of capped trial and decreasing bilirubin, the decision was made to remove the internal/external biliary drainage catheter. Should patient developed recurrent symptoms of obstruction, the stent should be approachable endoscopically in the future. Electronically Signed   By: Jacqulynn Cadet M.D.   On: 12/11/2020 15:39    Anti-infectives: Anti-infectives (From admission, onward)    Start     Dose/Rate Route Frequency Ordered Stop   12/12/20 1800  metroNIDAZOLE (FLAGYL) IVPB 500 mg  Status:  Discontinued        500 mg 100 mL/hr over 60 Minutes Intravenous Every 8 hours 12/12/20 1214 12/12/20 1214   12/12/20 1400  piperacillin-tazobactam (ZOSYN) IVPB 3.375 g        3.375 g 12.5 mL/hr over 240 Minutes Intravenous Every 8 hours 12/12/20 1142     12/12/20 1300  ceFEPIme (MAXIPIME) 2 g in sodium chloride 0.9 % 100 mL IVPB  Status:  Discontinued        2 g 200 mL/hr over 30 Minutes Intravenous Every 12 hours 12/12/20 1138 12/12/20 1138   12/12/20 1000  cefTRIAXone (ROCEPHIN) 2 g in sodium chloride 0.9 % 100 mL IVPB  2 g 200 mL/hr over 30 Minutes Intravenous  Once 12/12/20 0951 12/12/20 1024   12/12/20 1000  metroNIDAZOLE (FLAGYL) IVPB 500 mg        500 mg 100 mL/hr over 60 Minutes Intravenous  Once 12/12/20 8550 12/12/20 1130        Assessment/Plan Pancreatic head cancer with obstructed CBD and possible cholecystitis  The patient is s/p stent place and subsequent I/E biliary drain placement with removal.  His TB is still trending down but slowly.  He describes new pain after his drain was removed yesterday and a CT that is concerning for possible cholecystitis.  This is very difficult to ascertain from his history as he struggles to differentiate symptoms from his acute and chronic complaints.  He is currently not tender on exam, specifically in his RUQ c/w cholecystitis; however, there is a lot of inflammatory changes noted on his CT scan.  It is certainly possible  he has cholecystitis or this could be related to everything else he has going on in his RUQ at baseline.  We agree with admission for hydration and abx therapy.  Currently we do not plan for lap chole or perc chole tube.  If he fails to improve and it is determined this is his gallbladder more so than a continued issue with his CBD, then we could consider other intervention whether via drain or surgery.  He may have a CLD today and see how he does.  We need to closely monitor his LFTs.  We will follow with you  FEN - CLD/IVFs per [facility] VTE - ok for chemical prophylaxis from our standpoint ID - zosyn  DM HTN HLD PCM - albumin is 2.6 AKI - cr 1.44   LOS: 0 days    Henreitta Cea , [facility] Surgery 12/12/2020, 12:46 PM Please see Amion for pager number during day hours 7:00am-4:30pm or 7:00am -11:30am on weekends

## 2020-12-12 NOTE — ED Triage Notes (Signed)
Pt complains of abdominal pain after having a tube removed from his liver yesterday. Pt states that when he tries to eat or drink, he gets a burning sensation.

## 2020-12-12 NOTE — ED Provider Notes (Signed)
[facility] DEPT Provider Note   CSN: [id number] Arrival date & time: 12/12/20  0345     History Chief Complaint  Patient presents with   Abdominal Pain    Francisco Frank is a 70 y.o. male.  HPI  Patient with significant medical history of diabetes, hypertension, hyperlipidemia, pancreatic cancer has not started chemotherapy presents to the emergency department with chief complaint of stomach pain nausea and vomiting.  Patient states he recently had his external biliary drain removed on 09/09 and received a internal biliary drain.  He states since being discharged the hospital on 09 11 he has been having stomach pain, he states the pain is intermittent, will feel like a stabbing-like sensation and in his right upper quadrant which will radiate into his left scapula, will last approximately 20 to 30 seconds and resolve.  He states is worsened with p.o., states that he has been unable to tolerate p.o. as he becomes very nauseous and vomits.  He states he has had 1 episode of vomiting he denies hematemesis, coffee-ground emesis, he denies hematochezia, melena, constipation, diarrhea.  States he has had an episode of chills but denies fevers, he has no chest pain or shortness of breath, worsening pedal edema, urinary symptoms.  After reviewing patient's chart patient is being followed by Dr Betsy Coder of oncology, he is scheduled for a Port-A-Cath and will start chemotherapy on Monday.  He underwent  Cholangiogram that showed patency with sludge the external drain was exchanged for an internal/external drain passing through the stents.  It was capped as he had downtrending T bili.  This performed by Dr. Earleen Newport and Dr. Laurence Ferrari of IR, patient is also being followed by Dr. Chauncey Reading of general surgery.   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   HTN (hypertension) 11/09/2020   Type 2 diabetes mellitus without complication, with long-term current use of insulin ([place]) 11/09/2020   Loss of weight 09/03/2020   Fatigue 09/03/2020   Polydipsia 09/03/2020   Change in bowel habit 07/10/2020   Generalized abdominal pain 07/10/2020    Past Surgical History:  Procedure Laterality Date   BIOPSY  11/11/2020   Procedure: BIOPSY;  Surgeon: Irving Copas., MD;  Location: [facility];  Service: Gastroenterology;;   CARDIAC CATHETERIZATION     ENDOSCOPIC RETROGRADE CHOLANGIOPANCREATOGRAPHY (ERCP) WITH PROPOFOL N/A 11/11/2020   Procedure: ENDOSCOPIC RETROGRADE CHOLANGIOPANCREATOGRAPHY (ERCP) WITH PROPOFOL;  Surgeon: Irving Copas., MD;  Location: [facility];  Service: Gastroenterology;  Laterality: N/A;   ESOPHAGOGASTRODUODENOSCOPY (EGD) WITH PROPOFOL N/A 11/11/2020   Procedure: ESOPHAGOGASTRODUODENOSCOPY (EGD) WITH PROPOFOL;  Surgeon: Rush Landmark Telford Nab., MD;  Location: [facility];  Service: Gastroenterology;  Laterality: N/A;   EUS N/A 11/11/2020   Procedure: UPPER ENDOSCOPIC ULTRASOUND (EUS) RADIAL;  Surgeon: Irving Copas., MD;  Location: [facility];  Service: Gastroenterology;  Laterality: N/A;   FINE NEEDLE ASPIRATION  11/11/2020   Procedure: FINE NEEDLE ASPIRATION;  Surgeon: Rush Landmark Telford Nab., MD;  Location: [facility];  Service: Gastroenterology;;   HEMOSTASIS CONTROL  11/11/2020   Procedure: HEMOSTASIS CONTROL;  Surgeon: Irving Copas., MD;  Location: [facility];  Service:  Gastroenterology;;  epi injection   IR BILIARY STENT(S) EXISTING ACCESS INC DILATION CATH EXCHANGE  11/19/2020   IR CHOLANGIOGRAM EXISTING TUBE  12/11/2020   IR CONVERT BILIARY DRAIN TO INT EXT BILIARY DRAIN  11/28/2020   IR PERCUTANEOUS TRANSHEPATIC CHOLANGIOGRAM  11/12/2020   REPLACEMENT TOTAL KNEE     ROTATOR CUFF REPAIR     SPHINCTEROTOMY  11/11/2020   Procedure: SPHINCTEROTOMY;  Surgeon: Mansouraty, Telford Nab., MD;  Location: [facility];  Service: Gastroenterology;;       Family History  Problem Relation Age of Onset   Leukemia Mother        dx 80s   Cirrhosis Father    Alcohol abuse Father    Colon polyps Maternal Grandmother        unknown #   Heart disease Paternal Grandfather    Prostate cancer Paternal Grandfather        dx after 89   Esophageal cancer Neg Hx    Pancreatic cancer Neg Hx    Stomach cancer Neg Hx     Social History   Tobacco Use   Smoking status: Never   Smokeless tobacco: Current    Types: Snuff    Last attempt to quit: 2018  Vaping Use   Vaping Use: Never used  Substance Use Topics   Alcohol use: Yes    Comment: rare   Drug use: Never    Home Medications Prior to Admission medications   Medication Sig Start Date End Date Taking? Authorizing Provider  amLODipine (NORVASC) 5 MG tablet Take 5 mg by mouth daily. 10/22/20  Yes [provider]  atenolol (TENORMIN) 50 MG tablet Take 50 mg by mouth daily.   Yes [provider]  atorvastatin (LIPITOR) 40 MG tablet Take 40 mg by mouth daily. 11/16/20  Yes [provider]  clopidogrel (PLAVIX) 75 MG tablet Take 1 tablet (75 mg total) by mouth at bedtime. 11/14/20  Yes Kc, Maren Beach, MD  diphenhydramine-acetaminophen (TYLENOL PM) 25-500 MG TABS tablet Take 1 tablet by mouth at bedtime as needed. Patient taking differently: Take 1 tablet by mouth at bedtime as needed (sleep). 11/26/20  Yes Ladell Pier, MD  Insulin Glargine (BASAGLAR KWIKPEN) 100 UNIT/ML Inject 9 Units into the  skin at bedtime. 09/27/20  Yes [provider]  NOVOLOG FLEXPEN 100 UNIT/ML FlexPen Inject 2-10 Units into the skin 3 (three) times daily as needed for high blood sugar. If BS is 150-200=2 units, 201-250=4 units, 251-300=6 units, 301-350=8 units, 351-400=10 units. 09/18/20  Yes [provider]  traMADol (ULTRAM) 50 MG tablet Take 50 mg by mouth every 4 (four) hours as needed for moderate pain or severe pain. 06/11/20  Yes [provider]  BD PEN NEEDLE NANO 2ND GEN 32G X 4 MM MISC  11/17/20   [provider]  cholestyramine (QUESTRAN) 4 g packet Take 1 packet (4 g total) by mouth 2 (two) times daily before a meal for 5 days. Patient not taking: No sig reported 11/29/20 01/24/21  Dwan Bolt, MD  lipase/protease/amylase (CREON) 12000-38000 units CPEP capsule Take 1 capsule by mouth with breakfast, with lunch, and with evening meal. 12/02/20 12/02/21  [provider]    Allergies    Norco [hydrocodone-acetaminophen]  Review of Systems   Review of Systems  Constitutional:  Positive for chills. Negative for fever.  HENT:  Negative for congestion.   Respiratory:  Negative for shortness of breath.   Cardiovascular:  Negative for chest pain.  Gastrointestinal:  Positive for abdominal pain, nausea and vomiting. Negative for constipation.  Genitourinary:  Negative for enuresis and frequency.  Musculoskeletal:  Negative for back pain.  Skin:  Negative for rash.  Neurological:  Negative for dizziness.  Hematological:  Does not bruise/bleed easily.   Physical Exam Updated Vital Signs BP 100/69   Pulse 63   Temp 98.8 F (37.1 C) (Oral)   Resp 12   SpO2 97%   Physical Exam Vitals and nursing note reviewed.  Constitutional:      General: He is not in acute distress.    Appearance: He is not ill-appearing.  HENT:     Head: Normocephalic and atraumatic.     Nose: No congestion.  Eyes:     General: No scleral icterus.    Conjunctiva/sclera:  Conjunctivae normal.  Cardiovascular:     Rate and Rhythm: Normal rate and regular rhythm.     Pulses: Normal pulses.     Heart sounds: No murmur heard.   No friction rub. No gallop.  Pulmonary:     Effort: No respiratory distress.     Breath sounds: Rales present. No wheezing or rhonchi.     Comments: Patient has noted Rales in the right lower lobe, no wheezing, rhonchi or stridor present. Abdominal:     Palpations: Abdomen is soft.     Tenderness: There is no abdominal tenderness. There is no right CVA tenderness or left CVA tenderness.     Comments: Patient has a surgical scar on the right flank, no surrounding erythema or edema no evidence of infection present.  Abdomen was nondistended normal bowel sounds, dull to percussion, no guarding, rebound tenderness, peritoneal sign, negative Murphy sign McBurney point no CVA tenderness.  Musculoskeletal:     Right lower leg: No edema.     Left lower leg: No edema.  Skin:    General: Skin is warm and dry.     Coloration: Skin is jaundiced.     Comments: Patient has a noted slight yellow hue.  Neurological:     Mental Status: He is alert.  Psychiatric:        Mood and Affect: Mood normal.    ED Results / Procedures / Treatments   Labs (all labs ordered are listed, but only abnormal results are displayed) Labs Reviewed  CBC WITH DIFFERENTIAL/PLATELET - Abnormal; Notable for the following components:      Result Value   WBC 16.0 (*)    RBC 3.32 (*)    Hemoglobin 10.1 (*)    HCT 30.8 (*)    RDW 16.7 (*)    Neutro Abs 13.4 (*)    Abs Immature Granulocytes 0.22 (*)    All other components within normal limits  COMPREHENSIVE METABOLIC PANEL - Abnormal; Notable for the following components:   Potassium 3.1 (*)    CO2 20 (*)    Glucose, Bld 259 (*)    BUN 31 (*)    Creatinine, Ser 1.44 (*)    Albumin 2.6 (*)    ALT 46 (*)    Total Bilirubin 4.0 (*)    GFR, Estimated 52 (*)    Anion gap 16 (*)    All other components within  normal limits  URINALYSIS, ROUTINE W REFLEX MICROSCOPIC - Abnormal; Notable for the following components:  APPearance HAZY (*)    Bilirubin Urine MODERATE (*)    Protein, ur 100 (*)    Bacteria, UA RARE (*)    All other components within normal limits  RESP PANEL BY RT-PCR (FLU A&B, COVID) ARPGX2  CULTURE, BLOOD (ROUTINE X 2)  CULTURE, BLOOD (ROUTINE X 2)  LACTIC ACID, PLASMA  LACTIC ACID, PLASMA  LIPASE, BLOOD  MAGNESIUM    EKG EKG Interpretation  Date/Time:  Friday December 12 2020 07:02:19 EDT Ventricular Rate:  72 PR Interval:  157 QRS Duration: 99 QT Interval:  437 QTC Calculation: 479 R Axis:   -14 Text Interpretation: Sinus rhythm Borderline prolonged QT interval No old tracing to compare Confirmed by Dorie Rank [phone]) on 12/12/2020 7:36:22 AM  Radiology CT ABDOMEN PELVIS W CONTRAST  Addendum Date: 12/12/2020   ADDENDUM REPORT: 12/12/2020 09:21 ADDENDUM: Study discussed by telephone with PA Deno Etienne on 12/12/2020 at 09:19. We discussed conservative treatment including antibiotics and hydration. But the Interventional Radiology team at [facility] is being updated on Mr. Albus situation. Electronically Signed   By: Genevie Ann M.D.   On: 12/12/2020 09:21   Result Date: 12/12/2020 CLINICAL DATA:  70 year old male status post removal of transhepatic internal/external biliary drainage catheter yesterday. Underlying malignant obstructed jaundice secondary to pancreatic cancer. Indwelling metal biliary stent. EXAM: CT ABDOMEN AND PELVIS WITH CONTRAST TECHNIQUE: Multidetector CT imaging of the abdomen and pelvis was performed using the standard protocol following bolus administration of intravenous contrast. CONTRAST:  45mL OMNIPAQUE IOHEXOL 350 MG/ML SOLN COMPARISON:  CT Abdomen and Pelvis 11/28/2020 and earlier. FINDINGS: Lower chest: Minor atelectasis, greater on the left. Otherwise negative. Hepatobiliary: Mildly dilated and inflamed gallbladder with confluent regional  soft tissue stranding extending to the porta hepatis (series 2, image 28). Gallbladder wall thickening. But there is also gas within the gallbladder lumen, similar to diffuse pneumobilia elsewhere. And the metallic CBD stent also contains gas and appears to be patent terminating in the duodenum. Subtle former drainage tract through the liver is identified with no adverse features. No perihepatic free fluid. Liver enhancement maintained. Pancreas: Spiculated pancreatic head mass with upstream ductal dilatation and pancreatic atrophy. Spleen: Negative. Adrenals/Urinary Tract: Negative. Symmetric renal enhancement and early contrast excretion. Stomach/Bowel: No dilated large or small bowel. Secondary inflammation of the hepatic flexure appears related to the gallbladder on coronal image 49. Appendix is normal on series 2, image 64. Decompressed and negative terminal ileum. No free air. The distal stomach and duodenum also appear actively inflamed at the porta hepatis. The distal duodenum appear spared. There is only mild gas and fluid distention of the stomach. Vascular/Lymphatic: Aortoiliac calcified atherosclerosis. Major arterial structures remain patent. The portal venous system remains patent. Small but numerous peripancreatic lymph nodes appear stable from earlier this month. Reproductive: Negative. Other: No pelvic free fluid. Musculoskeletal: Stable. No acute or suspicious osseous lesion identified. IMPRESSION: 1. Positive for Acute Cholecystitis, with confluent regional inflammation secondarily involving the gastric antrum, duodenum, and hepatic flexure of colon. But the metallic CBD stent appears patent with no adverse features. And there is pneumobilia within the gallbladder lumen. 2. Transhepatic biliary drain removed with no adverse features along the former drainage tract. 3. Known Pancreatic Tumor. Electronically Signed: By: Genevie Ann M.D. On: 12/12/2020 09:07   DG Chest Port 1 View  Result Date:  12/12/2020 CLINICAL DATA:  70 year old male with abnormal pulmonary auscultation in the right lower lung. Abdominal pain following removal of internal/external biliary drain yesterday. EXAM: PORTABLE CHEST 1 VIEW  COMPARISON:  Chest CTA 08/08/2020. FINDINGS: Portable AP semi upright view at 0832 hours. Lower lung volumes. Stable mild elevation of the right hemidiaphragm. Normal cardiac size and mediastinal contours. Visualized tracheal air column is within normal limits. Allowing for portable technique the lungs are clear. No pneumothorax. Negative visible bowel gas pattern. No acute osseous abnormality identified. IMPRESSION: Lower lung volumes.  No acute cardiopulmonary abnormality. Electronically Signed   By: Genevie Ann M.D.   On: 12/12/2020 08:56   IR CHOLANGIOGRAM EXISTING TUBE  Result Date: 12/11/2020 INDICATION: 70 year old male with malignant obstructed jaundice secondary to pancreatic cancer. He has an internal metallic stent as well as an internal/external biliary drainage catheter which has been capped. He is tolerating the capped catheter well. His bilirubin continues to trend down and is the lowest it has been since the time of stent placement. He finds the internal/external drainage catheter very painful. He presents today for transhepatic cholangiogram and possible drain removal. EXAM: CHOLANGIOGRAM VIA EXISTING CATHETER MEDICATIONS: None ANESTHESIA/SEDATION: None FLUOROSCOPY TIME:  Fluoroscopy Time: 0 minutes 36 seconds (4 mGy). COMPLICATIONS: None immediate. PROCEDURE: Informed written consent was obtained from the patient after a thorough discussion of the procedural risks, benefits and alternatives. All questions were addressed. Maximal Sterile Barrier Technique was utilized including caps, mask, sterile gowns, sterile gloves, sterile drape, hand hygiene and skin antiseptic. A timeout was performed prior to the initiation of the procedure. The existing catheter was freed from the retention suture,  transected and removed over a Bentson wire. A 9 French vascular sheath was advanced over the wire and positioned in the right hepatic duct. A transhepatic cholangiogram was then performed. Minimal biliary ductal dilatation. Contrast material passes freely through the widely patent stent and into the duodenum. There may be minimal sludge present. The decision was made to proceed with drain removal. The wire and sheath were removed. A bandage was placed over the access site. IMPRESSION: Widely patent internal self expanding covered biliary stent. Given toleration of capped trial and decreasing bilirubin, the decision was made to remove the internal/external biliary drainage catheter. Should patient developed recurrent symptoms of obstruction, the stent should be approachable endoscopically in the future. Electronically Signed   By: Jacqulynn Cadet M.D.   On: 12/11/2020 15:39    Procedures .Critical Care Performed by: Marcello Fennel, PA-C Authorized by: Marcello Fennel, PA-C   Critical care provider statement:    Critical care time (minutes):  45   Critical care time was exclusive of:  Separately billable procedures and treating other patients   Critical care was necessary to treat or prevent imminent or life-threatening deterioration of the following conditions:  Sepsis   Critical care was time spent personally by me on the following activities:  Discussions with consultants, evaluation of patient's response to treatment, examination of patient, ordering and performing treatments and interventions, ordering and review of laboratory studies, ordering and review of radiographic studies, pulse oximetry, re-evaluation of patient's condition, obtaining history from patient or surrogate and review of old charts   I assumed direction of critical care for this patient from another provider in my specialty: no     Care discussed with: admitting provider     Medications Ordered in ED Medications   lactated ringers infusion ( Intravenous New Bag/Given 12/12/20 1003)  piperacillin-tazobactam (ZOSYN) IVPB 3.375 g (has no administration in time range)  iohexol (OMNIPAQUE) 350 MG/ML injection 80 mL (80 mLs Intravenous Contrast Given 12/12/20 0816)  sodium chloride 0.9 % bolus 1,000 mL (0  mLs Intravenous Stopped 12/12/20 0839)  sodium chloride 0.9 % bolus 1,000 mL (0 mLs Intravenous Stopped 12/12/20 0953)  cefTRIAXone (ROCEPHIN) 2 g in sodium chloride 0.9 % 100 mL IVPB (0 g Intravenous Stopped 12/12/20 1024)  metroNIDAZOLE (FLAGYL) IVPB 500 mg (500 mg Intravenous New Bag/Given 12/12/20 1005)    ED Course  I have reviewed the triage vital signs and the nursing notes.  Pertinent labs & imaging results that were available during my care of the patient were reviewed by me and considered in my medical decision making (see chart for details).    MDM Rules/Calculators/A&P                          Initial impression-patient presents with intermittent stomach pain nausea and vomiting.  He is alert, does not appear in acute stress, has noted hypotension.  Concern for possible sepsis.  Will obtain sepsis lab work-up, provide patient fluids, reassess with CT abdomen pelvis.  Work-up-CBC C shows leukocytosis of 16, normocytic anemia with hemoglobin 10.1, CMP shows hyponatremia of 131, CO2 of 20, glucose 259, BUN 31 creatinine 1.44, ALT 46, total T bili 4, anion gap 16.  UA shows negative nitrites leukocytes, shows rare bacteria.  Lipase is 16, lactic 1.7.  CT abdomen pelvis reveals positive for acute cholecystitis with confluent regional inflammation secondary involving the gastric antrum duodenum and hepatic flexure, transhepatic biliary drain removed with no adverse features along the formal drain tract.   Reassessment-patient was reassessed after liter fluids, he seems to be responding well, will continue to monitor.  We will add on additional fluids as his BP still remains low.  Imaging shows acute  cholecystitis due to patient's elevated white count with hypotension will initiate code sepsis.  Start him on broad-spectrum antibiotics.  Patient received 2 L of fluids at this time.  will start him on maintenance  Patient reassessed updated on lab work and imaging, recommend that he be admitted patient is agreeable this plan.  Will consult with general surgery as well as hospitalist for admission.  Consult spoke with Dr. Nevada Crane of radiology he reported patient's findings, he spoke with Dr. Laurence Ferrari  the interventional radiologist who place patient internal drain, he reviewed the imaging and recommends conservative management, admission and the [facility] team IR team will follow the patient.  Spoke with Barkley Boards, PA-C of general surgery she agrees with plan she will also speak with her attending Dr. Ayesha Rumpf so they can continue to monitor this patient, they agree with antibiotics and admission to hospitalist team.  Spoke with Dr Si Raider the hospitalist team he will admit the patient.  Rule out-low suspicion for bowel obstruction as abdomen is nondistended dull to percussion, he still passing gas and having normal bowel movements.  Low suspicion for UTI, [place], kidney stone as UA is negative for signs infection, no hematuria, patient denies urinary symptoms.  Low suspicion for intra-abdominal abscess as CT imaging negative for acute findings.  It is noted that patient has an elevated T bili but this appears to be downtrending from 2 days prior, imaging also reaffirms that the drain is patent.  Plan-admission due to acute cholecystitis.  Final Clinical Impression(s) / ED Diagnoses Final diagnoses:  Acute cholecystitis  Sepsis without acute organ dysfunction, due to unspecified organism [facility])    Rx / DC Orders ED Discharge Orders     None        Marcello Fennel, PA-C 12/12/20 1155  Dorie Rank, MD 12/14/20 [phone]

## 2020-12-12 NOTE — ED Notes (Signed)
Secretary to order pt clear liquid diet tray

## 2020-12-12 NOTE — ED Notes (Signed)
1031mL bolus of NS completed.

## 2020-12-12 NOTE — Progress Notes (Signed)
Pt c/o of intense pain while moaning in [facility]Q describing as sharp pain radiating to left shoulder.  Paged attending and administered Hydromorphone 2mg  per orders.  Pt's pain has subsided.  Pain does come intermittently.  Notified Dr Marlou Starks with [facility] as well.  Skin is dry and intact at drain removal site from yesterday.  No new orders.

## 2020-12-12 NOTE — H&P (Addendum)
History and Physical    Francisco Frank HAL:[id number] DOB: June 04, 1950 DOA: 12/12/2020  PCP: Kennieth Rad, MD  Patient coming from: home   Chief Complaint: abdominal pain  HPI: Francisco Frank is a 70 y.o. male with medical history significant for recently diagnosed pancreatic cancer complicated by biliary duct obstruction, who presents with the above.  Has had multiple recent IR procedure for biliary duct obstruction and has had intermittent ruq abdominal pain, sometimes severe, sometimes with nausea/vomiting, for at least a month. This has been worsening, sometimes brought on by eating. No chest pain or sob. No fevers but occasional chills. Yesterday had internal/external biliary drain removed by IR; does have biliary stent in place.  ED Course:   Imaging reveals acute cholecystitis. Started on antibiotics. Initial BPs soft as low as 63/49. 2 L LR bolus given. General surgery and IR consulted, both advising medical mgmt for now.  Review of Systems: As per HPI otherwise 10 point review of systems negative.    Past Medical History:  Diagnosis Date   Back pain    Diabetes mellitus without complication ([place])    Hearing deficit    Hyperlipemia    Hypertension    Insomnia    Sleep apnea     Past Surgical History:  Procedure Laterality Date   BIOPSY  11/11/2020   Procedure: BIOPSY;  Surgeon: Rush Landmark Telford Nab., MD;  Location: [facility];  Service: Gastroenterology;;   CARDIAC CATHETERIZATION     ENDOSCOPIC RETROGRADE CHOLANGIOPANCREATOGRAPHY (ERCP) WITH PROPOFOL N/A 11/11/2020   Procedure: ENDOSCOPIC RETROGRADE CHOLANGIOPANCREATOGRAPHY (ERCP) WITH PROPOFOL;  Surgeon: Irving Copas., MD;  Location: [facility];  Service: Gastroenterology;  Laterality: N/A;   ESOPHAGOGASTRODUODENOSCOPY (EGD) WITH PROPOFOL N/A 11/11/2020   Procedure: ESOPHAGOGASTRODUODENOSCOPY (EGD) WITH PROPOFOL;  Surgeon: Rush Landmark Telford Nab., MD;  Location: [facility];  Service:  Gastroenterology;  Laterality: N/A;   EUS N/A 11/11/2020   Procedure: UPPER ENDOSCOPIC ULTRASOUND (EUS) RADIAL;  Surgeon: Irving Copas., MD;  Location: [facility];  Service: Gastroenterology;  Laterality: N/A;   FINE NEEDLE ASPIRATION  11/11/2020   Procedure: FINE NEEDLE ASPIRATION;  Surgeon: Rush Landmark Telford Nab., MD;  Location: [facility];  Service: Gastroenterology;;   HEMOSTASIS CONTROL  11/11/2020   Procedure: HEMOSTASIS CONTROL;  Surgeon: Irving Copas., MD;  Location: [facility];  Service: Gastroenterology;;  epi injection   IR BILIARY STENT(S) EXISTING ACCESS INC DILATION CATH EXCHANGE  11/19/2020   IR CHOLANGIOGRAM EXISTING TUBE  12/11/2020   IR CONVERT BILIARY DRAIN TO INT EXT BILIARY DRAIN  11/28/2020   IR PERCUTANEOUS TRANSHEPATIC CHOLANGIOGRAM  11/12/2020   REPLACEMENT TOTAL KNEE     ROTATOR CUFF REPAIR     SPHINCTEROTOMY  11/11/2020   Procedure: SPHINCTEROTOMY;  Surgeon: Mansouraty, Telford Nab., MD;  Location: [facility];  Service: Gastroenterology;;     reports that he has never smoked. His smokeless tobacco use includes snuff. He reports current alcohol use. He reports that he does not use drugs.  Allergies  Allergen Reactions   Norco [Hydrocodone-Acetaminophen] Itching    Family History  Problem Relation Age of Onset   Leukemia Mother        dx 19s   Cirrhosis Father    Alcohol abuse Father    Colon polyps Maternal Grandmother        unknown #   Heart disease Paternal Grandfather    Prostate cancer Paternal Grandfather        dx after 5   Esophageal cancer Neg Hx    Pancreatic  cancer Neg Hx    Stomach cancer Neg Hx     Prior to Admission medications   Medication Sig Start Date End Date Taking? Authorizing Provider  amLODipine (NORVASC) 5 MG tablet Take 5 mg by mouth daily. 10/22/20  Yes [provider]  atenolol (TENORMIN) 50 MG tablet Take 50 mg by mouth daily.   Yes [provider]  atorvastatin (LIPITOR) 40 MG tablet  Take 40 mg by mouth daily. 11/16/20  Yes [provider]  clopidogrel (PLAVIX) 75 MG tablet Take 1 tablet (75 mg total) by mouth at bedtime. 11/14/20  Yes Kc, Maren Beach, MD  diphenhydramine-acetaminophen (TYLENOL PM) 25-500 MG TABS tablet Take 1 tablet by mouth at bedtime as needed. Patient taking differently: Take 1 tablet by mouth at bedtime as needed (sleep). 11/26/20  Yes Ladell Pier, MD  Insulin Glargine (BASAGLAR KWIKPEN) 100 UNIT/ML Inject 9 Units into the skin at bedtime. 09/27/20  Yes [provider]  NOVOLOG FLEXPEN 100 UNIT/ML FlexPen Inject 2-10 Units into the skin 3 (three) times daily as needed for high blood sugar. If BS is 150-200=2 units, 201-250=4 units, 251-300=6 units, 301-350=8 units, 351-400=10 units. 09/18/20  Yes [provider]  traMADol (ULTRAM) 50 MG tablet Take 50 mg by mouth every 4 (four) hours as needed for moderate pain or severe pain. 06/11/20  Yes [provider]  BD PEN NEEDLE NANO 2ND GEN 32G X 4 MM MISC  11/17/20   [provider]  cholestyramine (QUESTRAN) 4 g packet Take 1 packet (4 g total) by mouth 2 (two) times daily before a meal for 5 days. Patient not taking: No sig reported 11/29/20 01/24/21  Dwan Bolt, MD  lipase/protease/amylase (CREON) 12000-38000 units CPEP capsule Take 1 capsule by mouth with breakfast, with lunch, and with evening meal. 12/02/20 12/02/21  [provider]    Physical Exam: Vitals:   12/12/20 0900 12/12/20 0915 12/12/20 0945 12/12/20 1100  BP: (!) 143/71 93/67 103/68 100/69  Pulse: 62 62 63 63  Resp: 12 11 12 12   Temp:      TempSrc:      SpO2: 95% 98% 99% 97%    Constitutional: No acute distress Head: Atraumatic Eyes: mild scleral icterus ENM: Moist mucous membranes. Normal dentition.  Neck: Supple Respiratory: Clear to auscultation bilaterally, no wheezing/rales/rhonchi. Normal respiratory effort. No accessory muscle use. . Cardiovascular: Regular rate and rhythm. No  murmurs/rubs/gallops. Abdomen: mild ttp ruq and epigastrum, no rebound or guarding, not distended Musculoskeletal: No joint deformity upper and lower extremities. Normal ROM, no contractures. Normal muscle tone.  Skin: No rashes, lesions, or ulcers. Mild jaundice Extremities: No peripheral edema. Palpable peripheral pulses. Neurologic: Alert, moving all 4 extremities. Psychiatric: Normal insight and judgement.   Labs on Admission: I have personally reviewed following labs and imaging studies  CBC: Recent Labs  Lab 12/10/20 1309 12/12/20 0653  WBC 20.0* 16.0*  NEUTROABS 15.9* 13.4*  HGB 11.2* 10.1*  HCT 33.6* 30.8*  MCV 92.1 92.8  PLT 327 409   Basic Metabolic Panel: Recent Labs  Lab 12/10/20 1309 12/12/20 0653  NA 137 135  K 3.7 3.1*  CL 103 99  CO2 24 20*  GLUCOSE 232* 259*  BUN 19 31*  CREATININE 1.07 1.44*  CALCIUM 9.6 8.9  MG  --  1.8   GFR: Estimated Creatinine Clearance: 45.4 mL/min (A) (by C-G formula based on SCr of 1.44 mg/dL (H)). Liver Function Tests: Recent Labs  Lab 12/10/20 1309 12/11/20 1117 12/12/20 8119  AST 41  --  35  ALT 57*  --  46*  ALKPHOS 130*  --  122  BILITOT 4.9* 4.6* 4.0*  PROT 7.0  --  6.9  ALBUMIN 3.6  --  2.6*   Recent Labs  Lab 12/12/20 0653  LIPASE 16   No results for input(s): AMMONIA in the last 168 hours. Coagulation Profile: No results for input(s): INR, PROTIME in the last 168 hours. Cardiac Enzymes: No results for input(s): CKTOTAL, CKMB, CKMBINDEX, TROPONINI in the last 168 hours. BNP (last 3 results) No results for input(s): PROBNP in the last 8760 hours. HbA1C: No results for input(s): HGBA1C in the last 72 hours. CBG: Recent Labs  Lab 12/11/20 1108  GLUCAP 223*   Lipid Profile: No results for input(s): CHOL, HDL, LDLCALC, TRIG, CHOLHDL, LDLDIRECT in the last 72 hours. Thyroid Function Tests: No results for input(s): TSH, T4TOTAL, FREET4, T3FREE, THYROIDAB in the last 72 hours. Anemia Panel: No  results for input(s): VITAMINB12, FOLATE, FERRITIN, TIBC, IRON, RETICCTPCT in the last 72 hours. Urine analysis:    Component Value Date/Time   COLORURINE YELLOW 12/12/2020 0714   APPEARANCEUR HAZY (A) 12/12/2020 0714   LABSPEC 1.030 12/12/2020 0714   PHURINE 6.0 12/12/2020 0714   GLUCOSEU NEGATIVE 12/12/2020 0714   HGBUR NEGATIVE 12/12/2020 0714   BILIRUBINUR MODERATE (A) 12/12/2020 0714   KETONESUR NEGATIVE 12/12/2020 0714   PROTEINUR 100 (A) 12/12/2020 0714   NITRITE NEGATIVE 12/12/2020 0714   LEUKOCYTESUR NEGATIVE 12/12/2020 0714    Radiological Exams on Admission: CT ABDOMEN PELVIS W CONTRAST  Addendum Date: 12/12/2020   ADDENDUM REPORT: 12/12/2020 09:21 ADDENDUM: Study discussed by telephone with PA Deno Etienne on 12/12/2020 at 09:19. We discussed conservative treatment including antibiotics and hydration. But the Interventional Radiology team at [facility] is being updated on Mr. Finken situation. Electronically Signed   By: Genevie Ann M.D.   On: 12/12/2020 09:21   Result Date: 12/12/2020 CLINICAL DATA:  70 year old male status post removal of transhepatic internal/external biliary drainage catheter yesterday. Underlying malignant obstructed jaundice secondary to pancreatic cancer. Indwelling metal biliary stent. EXAM: CT ABDOMEN AND PELVIS WITH CONTRAST TECHNIQUE: Multidetector CT imaging of the abdomen and pelvis was performed using the standard protocol following bolus administration of intravenous contrast. CONTRAST:  6mL OMNIPAQUE IOHEXOL 350 MG/ML SOLN COMPARISON:  CT Abdomen and Pelvis 11/28/2020 and earlier. FINDINGS: Lower chest: Minor atelectasis, greater on the left. Otherwise negative. Hepatobiliary: Mildly dilated and inflamed gallbladder with confluent regional soft tissue stranding extending to the porta hepatis (series 2, image 28). Gallbladder wall thickening. But there is also gas within the gallbladder lumen, similar to diffuse pneumobilia elsewhere. And the  metallic CBD stent also contains gas and appears to be patent terminating in the duodenum. Subtle former drainage tract through the liver is identified with no adverse features. No perihepatic free fluid. Liver enhancement maintained. Pancreas: Spiculated pancreatic head mass with upstream ductal dilatation and pancreatic atrophy. Spleen: Negative. Adrenals/Urinary Tract: Negative. Symmetric renal enhancement and early contrast excretion. Stomach/Bowel: No dilated large or small bowel. Secondary inflammation of the hepatic flexure appears related to the gallbladder on coronal image 49. Appendix is normal on series 2, image 64. Decompressed and negative terminal ileum. No free air. The distal stomach and duodenum also appear actively inflamed at the porta hepatis. The distal duodenum appear spared. There is only mild gas and fluid distention of the stomach. Vascular/Lymphatic: Aortoiliac calcified atherosclerosis. Major arterial structures remain patent. The portal venous system remains patent. Small but  numerous peripancreatic lymph nodes appear stable from earlier this month. Reproductive: Negative. Other: No pelvic free fluid. Musculoskeletal: Stable. No acute or suspicious osseous lesion identified. IMPRESSION: 1. Positive for Acute Cholecystitis, with confluent regional inflammation secondarily involving the gastric antrum, duodenum, and hepatic flexure of colon. But the metallic CBD stent appears patent with no adverse features. And there is pneumobilia within the gallbladder lumen. 2. Transhepatic biliary drain removed with no adverse features along the former drainage tract. 3. Known Pancreatic Tumor. Electronically Signed: By: Genevie Ann M.D. On: 12/12/2020 09:07   DG Chest Port 1 View  Result Date: 12/12/2020 CLINICAL DATA:  70 year old male with abnormal pulmonary auscultation in the right lower lung. Abdominal pain following removal of internal/external biliary drain yesterday. EXAM: PORTABLE CHEST 1  VIEW COMPARISON:  Chest CTA 08/08/2020. FINDINGS: Portable AP semi upright view at 0832 hours. Lower lung volumes. Stable mild elevation of the right hemidiaphragm. Normal cardiac size and mediastinal contours. Visualized tracheal air column is within normal limits. Allowing for portable technique the lungs are clear. No pneumothorax. Negative visible bowel gas pattern. No acute osseous abnormality identified. IMPRESSION: Lower lung volumes.  No acute cardiopulmonary abnormality. Electronically Signed   By: Genevie Ann M.D.   On: 12/12/2020 08:56   IR CHOLANGIOGRAM EXISTING TUBE  Result Date: 12/11/2020 INDICATION: 70 year old male with malignant obstructed jaundice secondary to pancreatic cancer. He has an internal metallic stent as well as an internal/external biliary drainage catheter which has been capped. He is tolerating the capped catheter well. His bilirubin continues to trend down and is the lowest it has been since the time of stent placement. He finds the internal/external drainage catheter very painful. He presents today for transhepatic cholangiogram and possible drain removal. EXAM: CHOLANGIOGRAM VIA EXISTING CATHETER MEDICATIONS: None ANESTHESIA/SEDATION: None FLUOROSCOPY TIME:  Fluoroscopy Time: 0 minutes 36 seconds (4 mGy). COMPLICATIONS: None immediate. PROCEDURE: Informed written consent was obtained from the patient after a thorough discussion of the procedural risks, benefits and alternatives. All questions were addressed. Maximal Sterile Barrier Technique was utilized including caps, mask, sterile gowns, sterile gloves, sterile drape, hand hygiene and skin antiseptic. A timeout was performed prior to the initiation of the procedure. The existing catheter was freed from the retention suture, transected and removed over a Bentson wire. A 9 French vascular sheath was advanced over the wire and positioned in the right hepatic duct. A transhepatic cholangiogram was then performed. Minimal biliary  ductal dilatation. Contrast material passes freely through the widely patent stent and into the duodenum. There may be minimal sludge present. The decision was made to proceed with drain removal. The wire and sheath were removed. A bandage was placed over the access site. IMPRESSION: Widely patent internal self expanding covered biliary stent. Given toleration of capped trial and decreasing bilirubin, the decision was made to remove the internal/external biliary drainage catheter. Should patient developed recurrent symptoms of obstruction, the stent should be approachable endoscopically in the future. Electronically Signed   By: Jacqulynn Cadet M.D.   On: 12/11/2020 15:39    EKG: Independently reviewed. nsr  Assessment/Plan Principal Problem:   Acute cholecystitis Active Problems:   HTN (hypertension)   Type 2 diabetes mellitus without complication, with long-term current use of insulin ([facility])   CAD in native artery   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 ([place])   Cancer of head of pancreas ([place])   # Acute cholecystitis # Sepsis Likely consequence of multiple recent biliary procedures. Currently no drain in place, does have  biliary stent. Hypotensive on arrival, map improved to 79 currently, mentating clearly. Sepsis by hypotension, leukocytosis. - continue zosyn - f/u gen surg and IR consults - continue fluids @ 125 - npo for now, will wait for surgery consult prior to ordering diet - f/u blood cultures  # Pancreatic cancer Plan for port placement next week and start neoadjuvant chemotherapy shortly thereafter. Tbili at baseline (4), LFTs at baseline (mildly elevated)  # Acute kidney injury Mild, cr 1.44 from baseline around 1. Likely prerenal - monitor  # Hypokalemia Likely 2/2 reduced po. K 3.1 - f/u mg - 20 meq w/ fluids  # CAD Per chart review hx multi-vessel cad, no stents.  - plavix on hold - cont statin - tele 24 hours  # HTN Here borderline hypotensive -  hold home amlodipine and atenolol  # T2DM Here glucose moderately elevated to 259 - home lantus 9 qhs, increase as needed - SSI moderate   DVT prophylaxis: SCDs Code Status: full  Family Communication: none @ bedside  Consults called: IR, gen surg   Level of care: Med-Surg Status is: Inpatient  Remains inpatient appropriate because:Inpatient level of care appropriate due to severity of illness  Dispo: The patient is from: Home              Anticipated d/c is to: Home              Patient currently is not medically stable to d/c.   Difficult to place patient No        Desma Maxim MD [facility] Pager [phone]  If 7PM-7AM, please contact night-coverage [email or web address] Password [facility]  12/12/2020, 11:23 AM

## 2020-12-13 ENCOUNTER — Encounter (HOSPITAL_COMMUNITY): Payer: Self-pay | Admitting: Anesthesiology

## 2020-12-13 DIAGNOSIS — Z794 Long term (current) use of insulin: Secondary | ICD-10-CM

## 2020-12-13 DIAGNOSIS — I10 Essential (primary) hypertension: Secondary | ICD-10-CM

## 2020-12-13 DIAGNOSIS — A419 Sepsis, unspecified organism: Secondary | ICD-10-CM

## 2020-12-13 DIAGNOSIS — C250 Malignant neoplasm of head of pancreas: Secondary | ICD-10-CM

## 2020-12-13 DIAGNOSIS — E119 Type 2 diabetes mellitus without complications: Secondary | ICD-10-CM

## 2020-12-13 DIAGNOSIS — K810 Acute cholecystitis: Secondary | ICD-10-CM | POA: Diagnosis not present

## 2020-12-13 LAB — COMPREHENSIVE METABOLIC PANEL
ALT: 36 U/L (ref 0–44)
AST: 33 U/L (ref 15–41)
Albumin: 2.2 g/dL — ABNORMAL LOW (ref 3.5–5.0)
Alkaline Phosphatase: 108 U/L (ref 38–126)
Anion gap: 6 (ref 5–15)
BUN: 22 mg/dL (ref 8–23)
CO2: 22 mmol/L (ref 22–32)
Calcium: 7.9 mg/dL — ABNORMAL LOW (ref 8.9–10.3)
Chloride: 108 mmol/L (ref 98–111)
Creatinine, Ser: 1.16 mg/dL (ref 0.61–1.24)
GFR, Estimated: >60 mL/min (ref >60)
Glucose, Bld: 135 mg/dL — ABNORMAL HIGH (ref 70–99)
Potassium: 3.2 mmol/L — ABNORMAL LOW (ref 3.5–5.1)
Sodium: 136 mmol/L (ref 135–145)
Total Bilirubin: 2.8 mg/dL — ABNORMAL HIGH (ref 0.3–1.2)
Total Protein: 5.6 g/dL — ABNORMAL LOW (ref 6.5–8.1)

## 2020-12-13 LAB — GLUCOSE, CAPILLARY
Glucose-Capillary: 124 mg/dL — ABNORMAL HIGH (ref 70–99)
Glucose-Capillary: 171 mg/dL — ABNORMAL HIGH (ref 70–99)
Glucose-Capillary: 152 mg/dL — ABNORMAL HIGH (ref 70–99)
Glucose-Capillary: 86 mg/dL (ref 70–99)

## 2020-12-13 LAB — CBC
HCT: 27.7 % — ABNORMAL LOW (ref 39.0–52.0)
Hemoglobin: 9.3 g/dL — ABNORMAL LOW (ref 13.0–17.0)
MCH: 31 pg (ref 26.0–34.0)
MCHC: 33.6 g/dL (ref 30.0–36.0)
MCV: 92.3 fL (ref 80.0–100.0)
Platelets: 263 10*3/uL (ref 150–400)
RBC: 3 MIL/uL — ABNORMAL LOW (ref 4.22–5.81)
RDW: 17.2 % — ABNORMAL HIGH (ref 11.5–15.5)
WBC: 15.8 10*3/uL — ABNORMAL HIGH (ref 4.0–10.5)
nRBC: 0 % (ref 0.0–0.2)

## 2020-12-13 LAB — PROTIME-INR
INR: 1.1 (ref 0.8–1.2)
Prothrombin Time: 14 seconds (ref 11.4–15.2)

## 2020-12-13 MED ORDER — POTASSIUM CHLORIDE CRYS ER 20 MEQ PO TBCR
40.0000 meq | EXTENDED_RELEASE_TABLET | Freq: Once | ORAL | Status: AC
  Administered 2020-12-13: 40 meq via ORAL
  Filled 2020-12-13: qty 2

## 2020-12-13 NOTE — Progress Notes (Signed)
Subjective: Patient reports pain is improved this morning.  WBC is stable.  He has been afebrile.  Tolerating some clear liquids, and would like to try little more to eat today.   Objective: Vital signs in last 24 hours: Temp:  [98.2 F (36.8 C)-98.6 F (37 C)] 98.6 F (37 C) (09/24 0534) Pulse Rate:  [62-94] 73 (09/24 0534) Resp:  [11-20] 18 (09/24 0534) BP: (93-143)/(62-93) 95/67 (09/24 0534) SpO2:  [93 %-99 %] 93 % (09/24 0534) Weight:  [68.2 kg] 68.2 kg (09/23 2000)    Intake/Output from previous day: 09/23 0701 - 09/24 0700 In: 889 [P.O.:140; I.V.:649; IV Piggyback:100] Out: 801 [Urine:800; Stool:1] Intake/Output this shift: No intake/output data recorded.  PE: General: resting comfortably, NAD Neuro: alert and oriented, no focal deficits Resp: normal work of breathing on room air Abdomen: soft, nondistended, nontender, no tenderness to deep palpation in epigastric area or RUQ. Extremities: warm and well-perfused   Lab Results:  Recent Labs    12/12/20 0653 12/13/20 0546  WBC 16.0* 15.8*  HGB 10.1* 9.3*  HCT 30.8* 27.7*  PLT 308 263   BMET Recent Labs    12/10/20 1309 12/12/20 0653  NA 137 135  K 3.7 3.1*  CL 103 99  CO2 24 20*  GLUCOSE 232* 259*  BUN 19 31*  CREATININE 1.07 1.44*  CALCIUM 9.6 8.9   PT/INR Recent Labs    12/13/20 0546  LABPROT 14.0  INR 1.1   CMP     Component Value Date/Time   NA 135 12/12/2020 0653   K 3.1 (L) 12/12/2020 0653   CL 99 12/12/2020 0653   CO2 20 (L) 12/12/2020 0653   GLUCOSE 259 (H) 12/12/2020 0653   BUN 31 (H) 12/12/2020 0653   CREATININE 1.44 (H) 12/12/2020 0653   CREATININE 1.07 12/10/2020 1309   CALCIUM 8.9 12/12/2020 0653   PROT 6.9 12/12/2020 0653   ALBUMIN 2.6 (L) 12/12/2020 0653   AST 35 12/12/2020 0653   AST 41 12/10/2020 1309   ALT 46 (H) 12/12/2020 0653   ALT 57 (H) 12/10/2020 1309   ALKPHOS 122 12/12/2020 0653   BILITOT 4.0 (H) 12/12/2020 0653   BILITOT 4.9 (HH) 12/10/2020  1309   GFRNONAA 52 (L) 12/12/2020 0653   GFRNONAA >60 12/10/2020 1309   Lipase     Component Value Date/Time   LIPASE 16 12/12/2020 0653       Studies/Results: CT ABDOMEN PELVIS W CONTRAST  Addendum Date: 12/12/2020   ADDENDUM REPORT: 12/12/2020 09:21 ADDENDUM: Study discussed by telephone with PA Deno Etienne on 12/12/2020 at 09:19. We discussed conservative treatment including antibiotics and hydration. But the Interventional Radiology team at [facility] is being updated on Mr. Heal situation. Electronically Signed   By: Genevie Ann M.D.   On: 12/12/2020 09:21   Result Date: 12/12/2020 CLINICAL DATA:  70 year old male status post removal of transhepatic internal/external biliary drainage catheter yesterday. Underlying malignant obstructed jaundice secondary to pancreatic cancer. Indwelling metal biliary stent. EXAM: CT ABDOMEN AND PELVIS WITH CONTRAST TECHNIQUE: Multidetector CT imaging of the abdomen and pelvis was performed using the standard protocol following bolus administration of intravenous contrast. CONTRAST:  67mL OMNIPAQUE IOHEXOL 350 MG/ML SOLN COMPARISON:  CT Abdomen and Pelvis 11/28/2020 and earlier. FINDINGS: Lower chest: Minor atelectasis, greater on the left. Otherwise negative. Hepatobiliary: Mildly dilated and inflamed gallbladder with confluent regional soft tissue stranding extending to the porta hepatis (series 2, image 28). Gallbladder wall thickening. But there is also  gas within the gallbladder lumen, similar to diffuse pneumobilia elsewhere. And the metallic CBD stent also contains gas and appears to be patent terminating in the duodenum. Subtle former drainage tract through the liver is identified with no adverse features. No perihepatic free fluid. Liver enhancement maintained. Pancreas: Spiculated pancreatic head mass with upstream ductal dilatation and pancreatic atrophy. Spleen: Negative. Adrenals/Urinary Tract: Negative. Symmetric renal enhancement and early  contrast excretion. Stomach/Bowel: No dilated large or small bowel. Secondary inflammation of the hepatic flexure appears related to the gallbladder on coronal image 49. Appendix is normal on series 2, image 64. Decompressed and negative terminal ileum. No free air. The distal stomach and duodenum also appear actively inflamed at the porta hepatis. The distal duodenum appear spared. There is only mild gas and fluid distention of the stomach. Vascular/Lymphatic: Aortoiliac calcified atherosclerosis. Major arterial structures remain patent. The portal venous system remains patent. Small but numerous peripancreatic lymph nodes appear stable from earlier this month. Reproductive: Negative. Other: No pelvic free fluid. Musculoskeletal: Stable. No acute or suspicious osseous lesion identified. IMPRESSION: 1. Positive for Acute Cholecystitis, with confluent regional inflammation secondarily involving the gastric antrum, duodenum, and hepatic flexure of colon. But the metallic CBD stent appears patent with no adverse features. And there is pneumobilia within the gallbladder lumen. 2. Transhepatic biliary drain removed with no adverse features along the former drainage tract. 3. Known Pancreatic Tumor. Electronically Signed: By: Genevie Ann M.D. On: 12/12/2020 09:07   DG Chest Port 1 View  Result Date: 12/12/2020 CLINICAL DATA:  70 year old male with abnormal pulmonary auscultation in the right lower lung. Abdominal pain following removal of internal/external biliary drain yesterday. EXAM: PORTABLE CHEST 1 VIEW COMPARISON:  Chest CTA 08/08/2020. FINDINGS: Portable AP semi upright view at 0832 hours. Lower lung volumes. Stable mild elevation of the right hemidiaphragm. Normal cardiac size and mediastinal contours. Visualized tracheal air column is within normal limits. Allowing for portable technique the lungs are clear. No pneumothorax. Negative visible bowel gas pattern. No acute osseous abnormality identified. IMPRESSION:  Lower lung volumes.  No acute cardiopulmonary abnormality. Electronically Signed   By: Genevie Ann M.D.   On: 12/12/2020 08:56   IR CHOLANGIOGRAM EXISTING TUBE  Result Date: 12/11/2020 INDICATION: 70 year old male with malignant obstructed jaundice secondary to pancreatic cancer. He has an internal metallic stent as well as an internal/external biliary drainage catheter which has been capped. He is tolerating the capped catheter well. His bilirubin continues to trend down and is the lowest it has been since the time of stent placement. He finds the internal/external drainage catheter very painful. He presents today for transhepatic cholangiogram and possible drain removal. EXAM: CHOLANGIOGRAM VIA EXISTING CATHETER MEDICATIONS: None ANESTHESIA/SEDATION: None FLUOROSCOPY TIME:  Fluoroscopy Time: 0 minutes 36 seconds (4 mGy). COMPLICATIONS: None immediate. PROCEDURE: Informed written consent was obtained from the patient after a thorough discussion of the procedural risks, benefits and alternatives. All questions were addressed. Maximal Sterile Barrier Technique was utilized including caps, mask, sterile gowns, sterile gloves, sterile drape, hand hygiene and skin antiseptic. A timeout was performed prior to the initiation of the procedure. The existing catheter was freed from the retention suture, transected and removed over a Bentson wire. A 9 French vascular sheath was advanced over the wire and positioned in the right hepatic duct. A transhepatic cholangiogram was then performed. Minimal biliary ductal dilatation. Contrast material passes freely through the widely patent stent and into the duodenum. There may be minimal sludge present. The decision was made to proceed  with drain removal. The wire and sheath were removed. A bandage was placed over the access site. IMPRESSION: Widely patent internal self expanding covered biliary stent. Given toleration of capped trial and decreasing bilirubin, the decision was made  to remove the internal/external biliary drainage catheter. Should patient developed recurrent symptoms of obstruction, the stent should be approachable endoscopically in the future. Electronically Signed   By: Jacqulynn Cadet M.D.   On: 12/11/2020 15:39    Anti-infectives: Anti-infectives (From admission, onward)    Start     Dose/Rate Route Frequency Ordered Stop   12/12/20 1800  metroNIDAZOLE (FLAGYL) IVPB 500 mg  Status:  Discontinued        500 mg 100 mL/hr over 60 Minutes Intravenous Every 8 hours 12/12/20 1214 12/12/20 1214   12/12/20 1400  piperacillin-tazobactam (ZOSYN) IVPB 3.375 g        3.375 g 12.5 mL/hr over 240 Minutes Intravenous Every 8 hours 12/12/20 1142     12/12/20 1300  ceFEPIme (MAXIPIME) 2 g in sodium chloride 0.9 % 100 mL IVPB  Status:  Discontinued        2 g 200 mL/hr over 30 Minutes Intravenous Every 12 hours 12/12/20 1138 12/12/20 1138   12/12/20 1000  cefTRIAXone (ROCEPHIN) 2 g in sodium chloride 0.9 % 100 mL IVPB        2 g 200 mL/hr over 30 Minutes Intravenous  Once 12/12/20 0951 12/12/20 1024   12/12/20 1000  metroNIDAZOLE (FLAGYL) IVPB 500 mg        500 mg 100 mL/hr over 60 Minutes Intravenous  Once 12/12/20 0951 12/12/20 1130        Assessment/Plan 70 year old male with head of pancreas adenocarcinoma, status post percutaneous transhepatic stent placement and recent removal of internal/external biliary drain.  He presented yesterday with epigastric abdominal pain leukocytosis.  His CT scan shows some stranding in the right upper quadrant concerning for cholecystitis.  His WBC is stable at 16 today, however symptomatically he feels better today and has no abdominal tenderness on exam.  We will continue with nonoperative management for now.  Continue IV antibiotics and will advance to a full liquid diet today.  If pain recurs or worsens will consider percutaneous cholecystostomy versus cholecystectomy.  My concern with cholecystectomy is that if he has any  surgical complications this will further delay the start of his chemotherapy.   LOS: 1 day    Michaelle Birks, MD [facility] General, Hepatobiliary and Pancreatic Surgery 12/13/20 8:32 AM

## 2020-12-13 NOTE — Anesthesia Preprocedure Evaluation (Deleted)
Anesthesia Evaluation    Reviewed: Allergy & Precautions, Patient's Chart, lab work & pertinent test results  History of Anesthesia Complications Negative for: history of anesthetic complications  Airway        Dental   Pulmonary sleep apnea ,           Cardiovascular hypertension, Pt. on medications and Pt. on home beta blockers + CAD       Neuro/Psych negative neurological ROS     GI/Hepatic Neg liver ROS, Pancreatic mass w/ CBD obstruction   Endo/Other  diabetes, Type 2, Insulin Dependent  Renal/GU negative Renal ROS  negative genitourinary   Musculoskeletal negative musculoskeletal ROS (+)   Abdominal   Peds  Hematology  (+) anemia ,   Anesthesia Other Findings   Reproductive/Obstetrics                             Anesthesia Physical  Anesthesia Plan  ASA: 3  Anesthesia Plan: General   Post-op Pain Management:    Induction: Intravenous  PONV Risk Score and Plan: 2 and Ondansetron, Dexamethasone and Treatment may vary due to age or medical condition  Airway Management Planned: LMA  Additional Equipment: None  Intra-op Plan:   Post-operative Plan: Extubation in OR  Informed Consent:   Plan Discussed with: Anesthesiologist  Anesthesia Plan Comments:         Anesthesia Quick Evaluation

## 2020-12-13 NOTE — Plan of Care (Signed)

## 2020-12-13 NOTE — Progress Notes (Signed)
Pharmacy Antibiotic Note  Francisco Frank is a 70 y.o. male admitted on 12/12/2020 with  abdominal pain, nausea, and vomiting - found to have cholecystitis .  Patient with diagnosis of pancreatic cancer, has not started chemotherapy as of yet.  S/p multiple recent biliary procedures and currently has biliary stent.  Pharmacy has been consulted for Zosyn dosing.   Plan: Zosyn 3.375g IV q8 hours  No dose adjustments needed, Pharmacy will sign off  Height: 5\' 8"  (172.7 cm) Weight: 68.2 kg (150 lb 5.7 oz) IBW/kg (Calculated) : 68.4  Temp (24hrs), Avg:98.5 F (36.9 C), Min:98.2 F (36.8 C), Max:98.6 F (37 C)  Recent Labs  Lab 12/10/20 1309 12/12/20 0653 12/12/20 0950 12/13/20 0546  WBC 20.0* 16.0*  --  15.8*  CREATININE 1.07 1.44*  --   --   LATICACIDVEN  --  1.7 1.2  --      Estimated Creatinine Clearance: 45.4 mL/min (A) (by C-G formula based on SCr of 1.44 mg/dL (H)).    Allergies  Allergen Reactions   Norco [Hydrocodone-Acetaminophen] Itching    Antimicrobials this admission: CTX x1 Flagyl x1 Zosyn 9/23 >>   Dose adjustments this admission: N/A  Microbiology results: 9/23 BCx: ngtd  Thank you for allowing pharmacy to be a part of this patient's care.  Peggyann Juba, PharmD, BCPS Pharmacy: [phone] 12/13/2020 8:31 AM

## 2020-12-13 NOTE — Progress Notes (Signed)
PROGRESS NOTE    Francisco Frank  IDP:[id number] DOB: May 06, 1950 DOA: 12/12/2020 PCP: Kennieth Rad, MD    Chief Complaint  Patient presents with   Abdominal Pain    Brief Narrative:  Francisco Frank is a 70 y.o. male with medical history significant for recently diagnosed pancreatic cancer complicated by biliary duct obstruction with abdominal pain, associated with nausea, vomiting for over a month. Imaging showed acute cholecystitis, he was started on IV antibiotics.   Assessment & Plan:   Principal Problem:   Acute cholecystitis Active Problems:   HTN (hypertension)   Type 2 diabetes mellitus without complication, with long-term current use of insulin (HCC)   CAD in native artery   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 (HCC)   Cancer of head of pancreas (HCC)   Sepsis sec to acute cholecystitis: -sepsis physiology has improved. Pt reports his abd pain has improved.  No nausea, or vomiting.  CT scan shows inflammation and stranding in the right upper quadrant surrounding the gallbladder.   He was started on zosyn, continue the same.  Gen surgery consulted, recommended percutaneous cholecystostomy or cholecystectomy if he fails to improve with IV antibiotics.  Blood cultures are pending.  Improving leukocytosis.    Diabetes Mellitus: Insulin dependent, with hyperglycemia CBG (last 3)  Recent Labs    12/12/20 2112 12/13/20 0837 12/13/20 1224  GLUCAP 153* 124* 171*   Resume SSI. Continue with Lantus 9 units daily.    Hypokalemia and hypomagnesemia  Replaced.    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    CAD:  Was on plavix and statin at home. Plavix on hold.  Continue with statin.    Anemia of chronic disease:  - baseline hemoglobin around 10.    AKI:  Resolved with IV fluids.     DVT prophylaxis: (Lovenox) Code Status: (Full Code) Family Communication: none at bedside Disposition:   Status is: Inpatient  Remains inpatient appropriate because:Ongoing diagnostic testing needed not appropriate for outpatient work up, Unsafe d/c plan, and IV treatments appropriate due to intensity of illness or inability to take PO  Dispo: The patient is from: Home              Anticipated d/c is to: Home              Patient currently is not medically stable to d/c.   Difficult to place patient No       Consultants:  Gen surgery IR  Procedures: none.   Antimicrobials:  Antibiotics Given (last 72 hours)     Date/Time Action Medication Dose Rate   12/12/20 0957 New Bag/Given   cefTRIAXone (ROCEPHIN) 2 g in sodium chloride 0.9 % 100 mL IVPB 2 g 200 mL/hr   12/12/20 1005 New Bag/Given   metroNIDAZOLE (FLAGYL) IVPB 500 mg 500 mg 100 mL/hr   12/12/20 1354 New Bag/Given   piperacillin-tazobactam (ZOSYN) IVPB 3.375 g 3.375 g 12.5 mL/hr   12/12/20 2129 New Bag/Given   piperacillin-tazobactam (ZOSYN) IVPB 3.375 g 3.375 g 12.5 mL/hr   12/13/20 0543 New Bag/Given   piperacillin-tazobactam (ZOSYN) IVPB 3.375 g 3.375 g 12.5 mL/hr   12/13/20 1535 New Bag/Given   piperacillin-tazobactam (ZOSYN) IVPB 3.375 g 3.375 g 12.5 mL/hr         Subjective: Abd pain has improved, no nausea, vomiting or diarrhea.   Objective: Vitals:  12/12/20 2000 12/12/20 2109 12/13/20 0009 12/13/20 0534  BP:  102/70 94/63 95/67   Pulse:  86 77 73  Resp:  20 18 18   Temp:  98.6 F (37 C) 98.6 F (37 C) 98.6 F (37 C)  TempSrc:  Oral Oral Oral  SpO2:  94% 93% 93%  Weight: 68.2 kg     Height: 5\' 8"  (1.727 m)       Intake/Output Summary (Last 24 hours) at 12/13/2020 1307 Last data filed at 12/13/2020 0850 Gross per 24 hour  Intake 1039.04 ml  Output 1051 ml  Net -11.96 ml   Filed Weights   12/12/20 2000  Weight: 68.2 kg    Examination:  General exam: Appears calm and  comfortable  Respiratory system: Clear to auscultation. Respiratory effort normal. Cardiovascular system: S1 & S2 heard, RRR. No JVD,  No pedal edema. Gastrointestinal system: Abdomen is nondistended, soft and nontender. Normal bowel sounds heard. Central nervous system: Alert and oriented. No focal neurological deficits. Extremities: Symmetric 5 x 5 power. Skin: No rashes, lesions or ulcers Psychiatry:  Mood & affect appropriate.     Data Reviewed: I have personally reviewed following labs and imaging studies  CBC: Recent Labs  Lab 12/10/20 1309 12/12/20 0653 12/13/20 0546  WBC 20.0* 16.0* 15.8*  NEUTROABS 15.9* 13.4*  --   HGB 11.2* 10.1* 9.3*  HCT 33.6* 30.8* 27.7*  MCV 92.1 92.8 92.3  PLT 327 308 696    Basic Metabolic Panel: Recent Labs  Lab 12/10/20 1309 12/12/20 0653 12/13/20 0546  NA 137 135 136  K 3.7 3.1* 3.2*  CL 103 99 108  CO2 24 20* 22  GLUCOSE 232* 259* 135*  BUN 19 31* 22  CREATININE 1.07 1.44* 1.16  CALCIUM 9.6 8.9 7.9*  MG  --  1.8  --     GFR: Estimated Creatinine Clearance: 56.3 mL/min (by C-G formula based on SCr of 1.16 mg/dL).  Liver Function Tests: Recent Labs  Lab 12/10/20 1309 12/11/20 1117 12/12/20 0653 12/13/20 0546  AST 41  --  35 33  ALT 57*  --  46* 36  ALKPHOS 130*  --  122 108  BILITOT 4.9* 4.6* 4.0* 2.8*  PROT 7.0  --  6.9 5.6*  ALBUMIN 3.6  --  2.6* 2.2*    CBG: Recent Labs  Lab 12/11/20 1108 12/12/20 1617 12/12/20 2112 12/13/20 0837 12/13/20 1224  GLUCAP 223* 207* 153* 124* 171*     Recent Results (from the past 240 hour(s))  Blood culture (routine x 2)     Status: None (Preliminary result)   Collection Time: 12/12/20  6:55 AM   Specimen: BLOOD RIGHT HAND  Result Value Ref Range Status   Specimen Description   Final    BLOOD RIGHT HAND Performed at [facility], [place] [address] [place], [place] [address]    Special Requests   Final    BOTTLES DRAWN AEROBIC AND ANAEROBIC Blood  Culture adequate volume Performed at [facility] [address], [place], [place] [address]    Culture   Final    NO GROWTH < 24 HOURS Performed at [facility] [address] [place], [place] [address]    Report Status PENDING  Incomplete  Blood culture (routine x 2)     Status: None (Preliminary result)   Collection Time: 12/12/20  7:44 AM   Specimen: BLOOD  Result Value Ref Range Status   Specimen Description   Final  BLOOD LEFT ANTECUBITAL Performed at [facility] [address] [place], [place] [address]    Special Requests   Final    BOTTLES DRAWN AEROBIC AND ANAEROBIC Blood Culture results may not be optimal due to an excessive volume of blood received in culture bottles Performed at [facility] [address], [place], [place] [address]    Culture   Final    NO GROWTH < 24 HOURS Performed at [facility] [address] [place], [place] [address]    Report Status PENDING  Incomplete  Resp Panel by RT-PCR (Flu A&B, Covid) Nasopharyngeal Swab     Status: None   Collection Time: 12/12/20 10:24 AM   Specimen: Nasopharyngeal Swab; Nasopharyngeal(NP) swabs in vial transport medium  Result Value Ref Range Status   SARS Coronavirus 2 by RT PCR NEGATIVE NEGATIVE Final    Comment: (NOTE) SARS-CoV-2 target nucleic acids are NOT DETECTED.  The SARS-CoV-2 RNA is generally detectable in upper respiratory specimens during the acute phase of infection. The lowest concentration of SARS-CoV-2 viral copies this assay can detect is 138 copies/mL. A negative result does not preclude SARS-Cov-2 infection and should not be used as the sole basis for treatment or other patient management decisions. A negative result may occur with  improper specimen collection/handling, submission of specimen other than nasopharyngeal swab, presence of viral mutation(s) within the areas targeted by this assay, and inadequate  number of viral copies(<138 copies/mL). A negative result must be combined with clinical observations, patient history, and epidemiological information. The expected result is Negative.  Fact Sheet for Patients:  EntrepreneurPulse.com.au  Fact Sheet for Healthcare Providers:  IncredibleEmployment.be  This test is no t yet approved or cleared by the [place] FDA and  has been authorized for detection and/or diagnosis of SARS-CoV-2 by FDA under an Emergency Use Authorization (EUA). This EUA will remain  in effect (meaning this test can be used) for the duration of the COVID-19 declaration under Section 564(b)(1) of the Act, 21 U.S.C.section 360bbb-3(b)(1), unless the authorization is terminated  or revoked sooner.       Influenza A by PCR NEGATIVE NEGATIVE Final   Influenza B by PCR NEGATIVE NEGATIVE Final    Comment: (NOTE) The Xpert Xpress SARS-CoV-2/FLU/RSV plus assay is intended as an aid in the diagnosis of influenza from Nasopharyngeal swab specimens and should not be used as a sole basis for treatment. Nasal washings and aspirates are unacceptable for Xpert Xpress SARS-CoV-2/FLU/RSV testing.  Fact Sheet for Patients: EntrepreneurPulse.com.au  Fact Sheet for Healthcare Providers: IncredibleEmployment.be  This test is not yet approved or cleared by the [place] FDA and has been authorized for detection and/or diagnosis of SARS-CoV-2 by FDA under an Emergency Use Authorization (EUA). This EUA will remain in effect (meaning this test can be used) for the duration of the COVID-19 declaration under Section 564(b)(1) of the Act, 21 U.S.C. section 360bbb-3(b)(1), unless the authorization is terminated or revoked.  Performed at [facility], [place] [address] [place],  [address]          Radiology Studies: CT ABDOMEN PELVIS W CONTRAST  Addendum Date: 12/12/2020    ADDENDUM REPORT: 12/12/2020 09:21 ADDENDUM: Study discussed by telephone with PA Deno Etienne on 12/12/2020 at 09:19. We discussed conservative treatment including antibiotics and hydration. But the Interventional Radiology team at [facility] is being updated on Mr. Soler situation. Electronically Signed   By: Genevie Ann M.D.   On: 12/12/2020 09:21  Result Date: 12/12/2020 CLINICAL DATA:  70 year old male status post removal of transhepatic internal/external biliary drainage catheter yesterday. Underlying malignant obstructed jaundice secondary to pancreatic cancer. Indwelling metal biliary stent. EXAM: CT ABDOMEN AND PELVIS WITH CONTRAST TECHNIQUE: Multidetector CT imaging of the abdomen and pelvis was performed using the standard protocol following bolus administration of intravenous contrast. CONTRAST:  90mL OMNIPAQUE IOHEXOL 350 MG/ML SOLN COMPARISON:  CT Abdomen and Pelvis 11/28/2020 and earlier. FINDINGS: Lower chest: Minor atelectasis, greater on the left. Otherwise negative. Hepatobiliary: Mildly dilated and inflamed gallbladder with confluent regional soft tissue stranding extending to the porta hepatis (series 2, image 28). Gallbladder wall thickening. But there is also gas within the gallbladder lumen, similar to diffuse pneumobilia elsewhere. And the metallic CBD stent also contains gas and appears to be patent terminating in the duodenum. Subtle former drainage tract through the liver is identified with no adverse features. No perihepatic free fluid. Liver enhancement maintained. Pancreas: Spiculated pancreatic head mass with upstream ductal dilatation and pancreatic atrophy. Spleen: Negative. Adrenals/Urinary Tract: Negative. Symmetric renal enhancement and early contrast excretion. Stomach/Bowel: No dilated large or small bowel. Secondary inflammation of the hepatic flexure appears related to the gallbladder on coronal image 49. Appendix is normal on series 2, image 64. Decompressed and  negative terminal ileum. No free air. The distal stomach and duodenum also appear actively inflamed at the porta hepatis. The distal duodenum appear spared. There is only mild gas and fluid distention of the stomach. Vascular/Lymphatic: Aortoiliac calcified atherosclerosis. Major arterial structures remain patent. The portal venous system remains patent. Small but numerous peripancreatic lymph nodes appear stable from earlier this month. Reproductive: Negative. Other: No pelvic free fluid. Musculoskeletal: Stable. No acute or suspicious osseous lesion identified. IMPRESSION: 1. Positive for Acute Cholecystitis, with confluent regional inflammation secondarily involving the gastric antrum, duodenum, and hepatic flexure of colon. But the metallic CBD stent appears patent with no adverse features. And there is pneumobilia within the gallbladder lumen. 2. Transhepatic biliary drain removed with no adverse features along the former drainage tract. 3. Known Pancreatic Tumor. Electronically Signed: By: Genevie Ann M.D. On: 12/12/2020 09:07   DG Chest Port 1 View  Result Date: 12/12/2020 CLINICAL DATA:  70 year old male with abnormal pulmonary auscultation in the right lower lung. Abdominal pain following removal of internal/external biliary drain yesterday. EXAM: PORTABLE CHEST 1 VIEW COMPARISON:  Chest CTA 08/08/2020. FINDINGS: Portable AP semi upright view at 0832 hours. Lower lung volumes. Stable mild elevation of the right hemidiaphragm. Normal cardiac size and mediastinal contours. Visualized tracheal air column is within normal limits. Allowing for portable technique the lungs are clear. No pneumothorax. Negative visible bowel gas pattern. No acute osseous abnormality identified. IMPRESSION: Lower lung volumes.  No acute cardiopulmonary abnormality. Electronically Signed   By: Genevie Ann M.D.   On: 12/12/2020 08:56        Scheduled Meds:  atorvastatin  40 mg Oral Daily   insulin aspart  0-15 Units Subcutaneous  TID WC   insulin glargine  9 Units Subcutaneous QHS   Continuous Infusions:  0.9 % NaCl with KCl 20 mEq / L 125 mL/hr at 12/13/20 0539   piperacillin-tazobactam (ZOSYN)  IV 3.375 g (12/13/20 0543)     LOS: 1 day        Hosie Poisson, MD [facility]   To contact the attending provider between 7A-7P or the covering provider during after hours 7P-7A, please log into the web site [email or web address] and access using universal [place] password for  that web site. If you do not have the password, please call the hospital operator.  12/13/2020, 1:07 PM

## 2020-12-14 DIAGNOSIS — K810 Acute cholecystitis: Secondary | ICD-10-CM | POA: Diagnosis not present

## 2020-12-14 DIAGNOSIS — C250 Malignant neoplasm of head of pancreas: Secondary | ICD-10-CM | POA: Diagnosis not present

## 2020-12-14 DIAGNOSIS — I10 Essential (primary) hypertension: Secondary | ICD-10-CM | POA: Diagnosis not present

## 2020-12-14 DIAGNOSIS — A419 Sepsis, unspecified organism: Secondary | ICD-10-CM | POA: Diagnosis not present

## 2020-12-14 LAB — COMPREHENSIVE METABOLIC PANEL
ALT: 35 U/L (ref 0–44)
AST: 37 U/L (ref 15–41)
Albumin: 2.1 g/dL — ABNORMAL LOW (ref 3.5–5.0)
Alkaline Phosphatase: 98 U/L (ref 38–126)
Anion gap: 10 (ref 5–15)
BUN: 17 mg/dL (ref 8–23)
CO2: 19 mmol/L — ABNORMAL LOW (ref 22–32)
Calcium: 8.1 mg/dL — ABNORMAL LOW (ref 8.9–10.3)
Chloride: 114 mmol/L — ABNORMAL HIGH (ref 98–111)
Creatinine, Ser: 1.28 mg/dL — ABNORMAL HIGH (ref 0.61–1.24)
GFR, Estimated: 60 mL/min — ABNORMAL LOW (ref >60)
Glucose, Bld: 113 mg/dL — ABNORMAL HIGH (ref 70–99)
Potassium: 3.8 mmol/L (ref 3.5–5.1)
Sodium: 143 mmol/L (ref 135–145)
Total Bilirubin: 2.7 mg/dL — ABNORMAL HIGH (ref 0.3–1.2)
Total Protein: 5.5 g/dL — ABNORMAL LOW (ref 6.5–8.1)

## 2020-12-14 LAB — CBC
HCT: 27.7 % — ABNORMAL LOW (ref 39.0–52.0)
Hemoglobin: 9.4 g/dL — ABNORMAL LOW (ref 13.0–17.0)
MCH: 31.3 pg (ref 26.0–34.0)
MCHC: 33.9 g/dL (ref 30.0–36.0)
MCV: 92.3 fL (ref 80.0–100.0)
Platelets: 253 10*3/uL (ref 150–400)
RBC: 3 MIL/uL — ABNORMAL LOW (ref 4.22–5.81)
RDW: 17.4 % — ABNORMAL HIGH (ref 11.5–15.5)
WBC: 16.3 10*3/uL — ABNORMAL HIGH (ref 4.0–10.5)
nRBC: 0 % (ref 0.0–0.2)

## 2020-12-14 LAB — GLUCOSE, CAPILLARY
Glucose-Capillary: 113 mg/dL — ABNORMAL HIGH (ref 70–99)
Glucose-Capillary: 98 mg/dL (ref 70–99)
Glucose-Capillary: 142 mg/dL — ABNORMAL HIGH (ref 70–99)
Glucose-Capillary: 174 mg/dL — ABNORMAL HIGH (ref 70–99)

## 2020-12-14 MED ORDER — HYDROMORPHONE HCL 2 MG PO TABS
1.0000 mg | ORAL_TABLET | ORAL | Status: DC | PRN
  Administered 2020-12-14 – 2020-12-18 (×12): 2 mg via ORAL
  Administered 2020-12-18: 1 mg via ORAL
  Administered 2020-12-18: 2 mg via ORAL
  Administered 2020-12-18: 1 mg via ORAL
  Filled 2020-12-14 (×16): qty 1

## 2020-12-14 MED ORDER — METHOCARBAMOL 500 MG PO TABS: 500.0000 mg | ORAL_TABLET | Freq: Three times a day (TID) | ORAL | Status: DC | PRN

## 2020-12-14 MED ORDER — INSULIN GLARGINE 100 UNIT/ML ~~LOC~~ SOLN
7.0000 [IU] | Freq: Every day | SUBCUTANEOUS | Status: AC
  Administered 2020-12-14 – 2020-12-16 (×2): 7 [IU] via SUBCUTANEOUS
  Filled 2020-12-14 (×4): qty 0.07

## 2020-12-14 MED ORDER — ENSURE ENLIVE PO LIQD
237.0000 mL | Freq: Three times a day (TID) | ORAL | Status: DC
  Administered 2020-12-14 – 2020-12-15 (×3): 237 mL via ORAL

## 2020-12-14 MED ORDER — DIPHENHYDRAMINE HCL 25 MG PO CAPS
25.0000 mg | ORAL_CAPSULE | Freq: Four times a day (QID) | ORAL | Status: DC | PRN
  Administered 2020-12-14: 25 mg via ORAL
  Filled 2020-12-14: qty 1

## 2020-12-14 MED ORDER — OXYCODONE HCL 5 MG PO TABS
5.0000 mg | ORAL_TABLET | ORAL | Status: DC | PRN
  Administered 2020-12-14: 5 mg via ORAL
  Filled 2020-12-14: qty 1

## 2020-12-14 NOTE — Progress Notes (Signed)
Patient stated that his abdominal pain is ongoing the whole night with intermittent sharp pain almost every two minutes in interval. Patient stated that he will speak with the doctor this morning to figure out what the plan because the pain seems to be getting worse. Patient refused Dilaudid at this time.

## 2020-12-14 NOTE — Progress Notes (Signed)
PROGRESS NOTE    Francisco Frank  DHR:[id number] DOB: 17-Dec-1950 DOA: 12/12/2020 PCP: Kennieth Rad, MD    Chief Complaint  Patient presents with   Abdominal Pain    Brief Narrative:  Francisco Frank is a 70 y.o. male with medical history significant for recently diagnosed pancreatic cancer complicated by biliary duct obstruction with abdominal pain, associated with nausea, vomiting for over a month. Imaging showed acute cholecystitis, he was started on IV antibiotics.   Assessment & Plan:   Principal Problem:   Acute cholecystitis Active Problems:   HTN (hypertension)   Type 2 diabetes mellitus without complication, with long-term current use of insulin (HCC)   CAD in native artery   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 (HCC)   Cancer of head of pancreas (HCC)   Sepsis sec to acute cholecystitis: -sepsis physiology has improved. Pt reports his abd pain has improved but not resolved yet.  No nausea, or vomiting or diarrhea. CT scan shows inflammation and stranding in the right upper quadrant surrounding the gallbladder.   He was started on zosyn, continue the same.  Gen surgery consulted, recommended percutaneous cholecystostomy or cholecystectomy if he fails to improve with IV antibiotics.  Blood cultures are pending. Afebrile with persistent leukocysis.    Diabetes Mellitus: Insulin dependent, with hyperglycemia CBG (last 3)  Recent Labs    12/13/20 2131 12/14/20 0752 12/14/20 1202  GLUCAP 86 113* 98    Resume SSI. Decrease lantus to 7 units daily.    Hypokalemia and hypomagnesemia  Replaced.    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    CAD:  Was on plavix and statin at home. Plavix on hold just in case he needs surgery. .  Continue with statin.    Anemia of chronic disease:   Baseline hemoglobin between 10 to 12.  Currently at 9.4,  Transfuse to keep hemoglobin greater than 8.    AKI with mild metabolic acidosis.  Creatinine of 1.28.    DVT prophylaxis: (Lovenox) Code Status: (Full Code) Family Communication: family at bedside.  Disposition:   Status is: Inpatient  Remains inpatient appropriate because:Ongoing diagnostic testing needed not appropriate for outpatient work up, Unsafe d/c plan, and IV treatments appropriate due to intensity of illness or inability to take PO  Dispo: The patient is from: Home              Anticipated d/c is to: Home              Patient currently is not medically stable to d/c.   Difficult to place patient No       Consultants:  Gen surgery IR  Procedures: none.   Antimicrobials:  Antibiotics Given (last 72 hours)     Date/Time Action Medication Dose Rate   12/12/20 0957 New Bag/Given   cefTRIAXone (ROCEPHIN) 2 g in sodium chloride 0.9 % 100 mL IVPB 2 g 200 mL/hr   12/12/20 1005 New Bag/Given   metroNIDAZOLE (FLAGYL) IVPB 500 mg 500 mg 100 mL/hr   12/12/20 1354 New Bag/Given   piperacillin-tazobactam (ZOSYN) IVPB 3.375 g 3.375 g 12.5 mL/hr   12/12/20 2129 New Bag/Given   piperacillin-tazobactam (ZOSYN) IVPB 3.375 g 3.375 g 12.5 mL/hr   12/13/20 0543 New Bag/Given   piperacillin-tazobactam (ZOSYN) IVPB 3.375 g 3.375 g 12.5 mL/hr   12/13/20 1535 New Bag/Given  piperacillin-tazobactam (ZOSYN) IVPB 3.375 g 3.375 g 12.5 mL/hr   12/13/20 2234 New Bag/Given   piperacillin-tazobactam (ZOSYN) IVPB 3.375 g 3.375 g 12.5 mL/hr   12/14/20 0541 New Bag/Given   piperacillin-tazobactam (ZOSYN) IVPB 3.375 g 3.375 g 12.5 mL/hr   12/14/20 1413 New Bag/Given   piperacillin-tazobactam (ZOSYN) IVPB 3.375 g 3.375 g 12.5 mL/hr         Subjective: Worsening pain in the abdomen.  No nausea, vomiting and diarrhea.   Objective: Vitals:   12/13/20 1333 12/13/20 2129 12/14/20 0516 12/14/20 1515  BP: 119/74 115/65  132/88 125/81  Pulse: 73 84 82 97  Resp: 15 16 16 19   Temp: 98.9 F (37.2 C) 99.1 F (37.3 C) 98.9 F (37.2 C) (!) 97.5 F (36.4 C)  TempSrc: Oral Oral Oral Oral  SpO2: 96% 97% 95% 96%  Weight:      Height:        Intake/Output Summary (Last 24 hours) at 12/14/2020 1549 Last data filed at 12/14/2020 1455 Gross per 24 hour  Intake 1353.38 ml  Output 1450 ml  Net -96.62 ml    Filed Weights   12/12/20 2000  Weight: 68.2 kg    Examination: General exam: Appears calm and comfortable  Respiratory system: Clear to auscultation. Respiratory effort normal. Cardiovascular system: S1 & S2 heard, RRR. No JVD,  No pedal edema. Gastrointestinal system: Abdomen is soft, generalized tenderness, no distension.  Central nervous system: Alert and oriented. No focal neurological deficits. Extremities: Symmetric 5 x 5 power. Skin: No rashes, lesions or ulcers Psychiatry:  Mood & affect appropriate.      Data Reviewed: I have personally reviewed following labs and imaging studies  CBC: Recent Labs  Lab 12/10/20 1309 12/12/20 0653 12/13/20 0546 12/14/20 0645  WBC 20.0* 16.0* 15.8* 16.3*  NEUTROABS 15.9* 13.4*  --   --   HGB 11.2* 10.1* 9.3* 9.4*  HCT 33.6* 30.8* 27.7* 27.7*  MCV 92.1 92.8 92.3 92.3  PLT 327 308 263 253     Basic Metabolic Panel: Recent Labs  Lab 12/10/20 1309 12/12/20 0653 12/13/20 0546 12/14/20 0645  NA 137 135 136 143  K 3.7 3.1* 3.2* 3.8  CL 103 99 108 114*  CO2 24 20* 22 19*  GLUCOSE 232* 259* 135* 113*  BUN 19 31* 22 17  CREATININE 1.07 1.44* 1.16 1.28*  CALCIUM 9.6 8.9 7.9* 8.1*  MG  --  1.8  --   --      GFR: Estimated Creatinine Clearance: 51.1 mL/min (A) (by C-G formula based on SCr of 1.28 mg/dL (H)).  Liver Function Tests: Recent Labs  Lab 12/10/20 1309 12/11/20 1117 12/12/20 0653 12/13/20 0546 12/14/20 0645  AST 41  --  35 33 37  ALT 57*  --  46* 36 35  ALKPHOS 130*  --  122 108 98  BILITOT 4.9* 4.6* 4.0* 2.8* 2.7*  PROT  7.0  --  6.9 5.6* 5.5*  ALBUMIN 3.6  --  2.6* 2.2* 2.1*     CBG: Recent Labs  Lab 12/13/20 1224 12/13/20 1625 12/13/20 2131 12/14/20 0752 12/14/20 1202  GLUCAP 171* 152* 86 113* 98      Recent Results (from the past 240 hour(s))  Blood culture (routine x 2)     Status: None (Preliminary result)   Collection Time: 12/12/20  6:55 AM   Specimen: BLOOD RIGHT HAND  Result Value Ref Range Status   Specimen Description   Final    BLOOD RIGHT HAND Performed at  [facility], [place] [address], [place], [place] [address]    Special Requests   Final    BOTTLES DRAWN AEROBIC AND ANAEROBIC Blood Culture adequate volume Performed at [place] [address], [place], [place] [address]    Culture   Final    NO GROWTH 2 DAYS Performed at [place] [address], [place], [place] [address]    Report Status PENDING  Incomplete  Blood culture (routine x 2)     Status: None (Preliminary result)   Collection Time: 12/12/20  7:44 AM   Specimen: BLOOD  Result Value Ref Range Status   Specimen Description   Final    BLOOD LEFT ANTECUBITAL Performed at [place] [address], [place], [place] [address]    Special Requests   Final    BOTTLES DRAWN AEROBIC AND ANAEROBIC Blood Culture results may not be optimal due to an excessive volume of blood received in culture bottles Performed at [place] [address] [place], [place] [address]    Culture   Final    NO GROWTH 2 DAYS Performed at [place] [address], [place], [place] [address]    Report Status PENDING  Incomplete  Resp Panel by RT-PCR (Flu A&B, Covid) Nasopharyngeal Swab     Status: None   Collection Time: 12/12/20 10:24 AM   Specimen: Nasopharyngeal Swab; Nasopharyngeal(NP) swabs in vial transport medium  Result Value Ref Range Status   SARS Coronavirus 2 by RT PCR NEGATIVE NEGATIVE Final    Comment:  (NOTE) SARS-CoV-2 target nucleic acids are NOT DETECTED.  The SARS-CoV-2 RNA is generally detectable in upper respiratory specimens during the acute phase of infection. The lowest concentration of SARS-CoV-2 viral copies this assay can detect is 138 copies/mL. A negative result does not preclude SARS-Cov-2 infection and should not be used as the sole basis for treatment or other patient management decisions. A negative result may occur with  improper specimen collection/handling, submission of specimen other than nasopharyngeal swab, presence of viral mutation(s) within the areas targeted by this assay, and inadequate number of viral copies(<138 copies/mL). A negative result must be combined with clinical observations, patient history, and epidemiological information. The expected result is Negative.  Fact Sheet for Patients:  EntrepreneurPulse.com.au  Fact Sheet for Healthcare Providers:  IncredibleEmployment.be  This test is no t yet approved or cleared by the [place] FDA and  has been authorized for detection and/or diagnosis of SARS-CoV-2 by FDA under an Emergency Use Authorization (EUA). This EUA will remain  in effect (meaning this test can be used) for the duration of the COVID-19 declaration under Section 564(b)(1) of the Act, 21 U.S.C.section 360bbb-3(b)(1), unless the authorization is terminated  or revoked sooner.       Influenza A by PCR NEGATIVE NEGATIVE Final   Influenza B by PCR NEGATIVE NEGATIVE Final    Comment: (NOTE) The Xpert Xpress SARS-CoV-2/FLU/RSV plus assay is intended as an aid in the diagnosis of influenza from Nasopharyngeal swab specimens and should not be used as a sole basis for treatment. Nasal washings and aspirates are unacceptable for Xpert Xpress SARS-CoV-2/FLU/RSV testing.  Fact Sheet for Patients: EntrepreneurPulse.com.au  Fact Sheet for Healthcare  Providers: IncredibleEmployment.be  This test is not yet approved or cleared by the [place] FDA and has been authorized for detection and/or diagnosis of SARS-CoV-2 by FDA under an Emergency Use Authorization (EUA). This EUA will remain in effect (  meaning this test can be used) for the duration of the COVID-19 declaration under Section 564(b)(1) of the Act, 21 U.S.C. section 360bbb-3(b)(1), unless the authorization is terminated or revoked.  Performed at [facility], [place] [address], [place], [place] [address]           Radiology Studies: No results found.      Scheduled Meds:  atorvastatin  40 mg Oral Daily   feeding supplement  237 mL Oral TID BM   insulin aspart  0-15 Units Subcutaneous TID WC   insulin glargine  9 Units Subcutaneous QHS   Continuous Infusions:  0.9 % NaCl with KCl 20 mEq / L 75 mL/hr at 12/14/20 0405   piperacillin-tazobactam (ZOSYN)  IV 3.375 g (12/14/20 1413)     LOS: 2 days        Hosie Poisson, MD [facility]   To contact the attending provider between 7A-7P or the covering provider during after hours 7P-7A, please log into the web site [email or web address] and access using universal [place] password for that web site. If you do not have the password, please call the hospital operator.  12/14/2020, 3:49 PM

## 2020-12-14 NOTE — Progress Notes (Signed)
Subjective: Pain is worse again today.  Afebrile, WBC is stable at 16.  Patient says he has not been able to eat very much because of the pain.  He still says that the pain is in the left upper quadrant, but there is no pain in the right upper quadrant or epigastric area.  Describes it as sharp intermittent pains.   Objective: Vital signs in last 24 hours: Temp:  [98.9 F (37.2 C)-99.1 F (37.3 C)] 98.9 F (37.2 C) (09/25 0516) Pulse Rate:  [73-84] 82 (09/25 0516) Resp:  [15-16] 16 (09/25 0516) BP: (115-132)/(65-88) 132/88 (09/25 0516) SpO2:  [95 %-97 %] 95 % (09/25 0516) Last BM Date: 12/13/20  Intake/Output from previous day: 09/24 0701 - 09/25 0700 In: 2783.6 [P.O.:510; I.V.:2139.9; IV Piggyback:133.7] Out: 1500 [Urine:1500] Intake/Output this shift: No intake/output data recorded.  PE: General: resting comfortably, NAD Neuro: alert and oriented, no focal deficits Resp: normal work of breathing on room air Abdomen: soft, nondistended, mildly tender to palpation in the LUQ, no tenderness in the RUQ or epigastric area. Extremities: warm and well-perfused   Lab Results:  Recent Labs    12/13/20 0546 12/14/20 0645  WBC 15.8* 16.3*  HGB 9.3* 9.4*  HCT 27.7* 27.7*  PLT 263 253   BMET Recent Labs    12/12/20 0653 12/13/20 0546  NA 135 136  K 3.1* 3.2*  CL 99 108  CO2 20* 22  GLUCOSE 259* 135*  BUN 31* 22  CREATININE 1.44* 1.16  CALCIUM 8.9 7.9*   PT/INR Recent Labs    12/13/20 0546  LABPROT 14.0  INR 1.1   CMP     Component Value Date/Time   NA 136 12/13/2020 0546   K 3.2 (L) 12/13/2020 0546   CL 108 12/13/2020 0546   CO2 22 12/13/2020 0546   GLUCOSE 135 (H) 12/13/2020 0546   BUN 22 12/13/2020 0546   CREATININE 1.16 12/13/2020 0546   CREATININE 1.07 12/10/2020 1309   CALCIUM 7.9 (L) 12/13/2020 0546   PROT 5.6 (L) 12/13/2020 0546   ALBUMIN 2.2 (L) 12/13/2020 0546   AST 33 12/13/2020 0546   AST 41 12/10/2020 1309   ALT 36 12/13/2020  0546   ALT 57 (H) 12/10/2020 1309   ALKPHOS 108 12/13/2020 0546   BILITOT 2.8 (H) 12/13/2020 0546   BILITOT 4.9 (HH) 12/10/2020 1309   GFRNONAA >60 12/13/2020 0546   GFRNONAA >60 12/10/2020 1309   Lipase     Component Value Date/Time   LIPASE 16 12/12/2020 0653       Studies/Results: CT ABDOMEN PELVIS W CONTRAST  Addendum Date: 12/12/2020   ADDENDUM REPORT: 12/12/2020 09:21 ADDENDUM: Study discussed by telephone with PA Deno Etienne on 12/12/2020 at 09:19. We discussed conservative treatment including antibiotics and hydration. But the Interventional Radiology team at [facility] is being updated on Mr. Dung situation. Electronically Signed   By: Genevie Ann M.D.   On: 12/12/2020 09:21   Result Date: 12/12/2020 CLINICAL DATA:  70 year old male status post removal of transhepatic internal/external biliary drainage catheter yesterday. Underlying malignant obstructed jaundice secondary to pancreatic cancer. Indwelling metal biliary stent. EXAM: CT ABDOMEN AND PELVIS WITH CONTRAST TECHNIQUE: Multidetector CT imaging of the abdomen and pelvis was performed using the standard protocol following bolus administration of intravenous contrast. CONTRAST:  75mL OMNIPAQUE IOHEXOL 350 MG/ML SOLN COMPARISON:  CT Abdomen and Pelvis 11/28/2020 and earlier. FINDINGS: Lower chest: Minor atelectasis, greater on the left. Otherwise negative. Hepatobiliary: Mildly dilated and  inflamed gallbladder with confluent regional soft tissue stranding extending to the porta hepatis (series 2, image 28). Gallbladder wall thickening. But there is also gas within the gallbladder lumen, similar to diffuse pneumobilia elsewhere. And the metallic CBD stent also contains gas and appears to be patent terminating in the duodenum. Subtle former drainage tract through the liver is identified with no adverse features. No perihepatic free fluid. Liver enhancement maintained. Pancreas: Spiculated pancreatic head mass with upstream  ductal dilatation and pancreatic atrophy. Spleen: Negative. Adrenals/Urinary Tract: Negative. Symmetric renal enhancement and early contrast excretion. Stomach/Bowel: No dilated large or small bowel. Secondary inflammation of the hepatic flexure appears related to the gallbladder on coronal image 49. Appendix is normal on series 2, image 64. Decompressed and negative terminal ileum. No free air. The distal stomach and duodenum also appear actively inflamed at the porta hepatis. The distal duodenum appear spared. There is only mild gas and fluid distention of the stomach. Vascular/Lymphatic: Aortoiliac calcified atherosclerosis. Major arterial structures remain patent. The portal venous system remains patent. Small but numerous peripancreatic lymph nodes appear stable from earlier this month. Reproductive: Negative. Other: No pelvic free fluid. Musculoskeletal: Stable. No acute or suspicious osseous lesion identified. IMPRESSION: 1. Positive for Acute Cholecystitis, with confluent regional inflammation secondarily involving the gastric antrum, duodenum, and hepatic flexure of colon. But the metallic CBD stent appears patent with no adverse features. And there is pneumobilia within the gallbladder lumen. 2. Transhepatic biliary drain removed with no adverse features along the former drainage tract. 3. Known Pancreatic Tumor. Electronically Signed: By: Genevie Ann M.D. On: 12/12/2020 09:07   DG Chest Port 1 View  Result Date: 12/12/2020 CLINICAL DATA:  70 year old male with abnormal pulmonary auscultation in the right lower lung. Abdominal pain following removal of internal/external biliary drain yesterday. EXAM: PORTABLE CHEST 1 VIEW COMPARISON:  Chest CTA 08/08/2020. FINDINGS: Portable AP semi upright view at 0832 hours. Lower lung volumes. Stable mild elevation of the right hemidiaphragm. Normal cardiac size and mediastinal contours. Visualized tracheal air column is within normal limits. Allowing for portable  technique the lungs are clear. No pneumothorax. Negative visible bowel gas pattern. No acute osseous abnormality identified. IMPRESSION: Lower lung volumes.  No acute cardiopulmonary abnormality. Electronically Signed   By: Genevie Ann M.D.   On: 12/12/2020 08:56      Assessment/Plan 70 year old male with head of pancreas adenocarcinoma, status post percutaneous transhepatic stent placement and recent removal of internal/external biliary drain.  His pain began after recent removal of his biliary drain.  CT scan shows stranding in the right upper quadrant, however the pain has no focal right upper quadrant pain or tenderness.  He endorses only left upper quadrant pain.  There was no filling of the cystic duct on his recent cholangiogram, however there is some gas on the cystic duct on CT scan, suggesting some communication between the gallbladder and the biliary tree.  His symptoms are not consistent with gallbladder pain, but no other source of his leukocytosis has been identified.  It is possible that some of his pain is related to his primary pancreatic tumor.  He is scheduled to start chemotherapy on 10/5.  The start of his treatment has already been delayed because of prolonged jaundice following biliary stent placement.  I would not favor performing a cholecystectomy at this point as any complications would further delay treatment for him.  Continue antibiotics, and if pain persists, recommend IR consultation to consider percutaneous cholecystostomy.  I will cancel his scheduled port  placement for tomorrow as he has an ongoing leukocytosis and will reschedule for later this week.    LOS: 2 days    Michaelle Birks, MD [facility] Surgery General, Hepatobiliary and Pancreatic Surgery 12/14/20 7:55 AM

## 2020-12-14 NOTE — Progress Notes (Signed)
Initial Nutrition Assessment  DOCUMENTATION CODES:   Not applicable, pt with hx of severe malnutrition which likely persists but RD unable to confirm without NFPE  INTERVENTION:   - Ensure Enlive po TID, each supplement provides 350 kcal and 20 grams of protein  - Magic Cup TID with meals, each supplement provides 290 kcal and 9 grams of protein  - Encourage PO intake  NUTRITION DIAGNOSIS:   Inadequate oral intake related to vomiting, other (abdominal pain) as evidenced by per patient/family report.  GOAL:   Patient will meet greater than or equal to 90% of their needs  MONITOR:   PO intake, Supplement acceptance, Diet advancement, Labs, Weight trends, I & O's  REASON FOR ASSESSMENT:   Malnutrition Screening Tool    ASSESSMENT:   70 year old male who presented tot the ED on 9/23 with with abdominal pain and vomiting after having internal/external biliary drain removed by IR on 9/22. PMH of newly-diagnosed pancreatic cancer (has not yet started on chemotherapy) complicated by biliary obstruction, DM, HLD, HTN. Pt admitted with sepsis secondary to acute cholecystitis.  9/23 - clear liquids 9/24 - full liquids  RD working remotely.  Surgery following. Per Surgery note, pt with worsened pain today that is preventing him from eating. Surgery recommending IR c/s to consider percutaneous cholecystostomy if pt's pain persists. Noted pt is scheduled to start chemotherapy on 10/5. Port-a-cath placement to be delayed to later this week per notes.  RD familiar with pt from recent previous admission. When RD assessed pt on 9/09, pt met criteria for severe malnutrition in the context of chronic illness. Suspect malnutrition persists but unable to confirm without NFPE. Unable to reach pt via phone call to hospital room at this time.  Reviewed available weight history in chart. Current weight of 68.2 kg appears to be carried over from previous encounter on 9/21. If accurate, pt has  experienced a 19.3 kg weight loss since 08/06/20. This is a 22.1% weight loss in just over 4 months months which is severe and significant for timeframe.  Pt currently on a full liquid diet with 2 meal completions charted as 0% on 9/24. Noted one episode of emesis documented on 9/24. When RD previously assessed pt, he was consuming Ensure Max/Premier Protein supplements. Given drastic decrease in PO intake, RD to order Ensure Enlive to provide more kcal and protein. Will also add Magic Cups between meals.  Medications reviewed and include: SSI, lantus 9 units daily, IV abx IVF: NS with KCl @ 75 ml/hr  Labs reviewed: creatinine 1.28, hemoglobin 9.4, WBC 16.3 CBG's: 86-171 x 24 hours  UOP: 1500 ml x 24 hours I/O's: +1.1 L since admit  NUTRITION - FOCUSED PHYSICAL EXAM:  Unable to complete at this time. RD working remotely.  Diet Order:   Diet Order             Diet full liquid Room service appropriate? Yes; Fluid consistency: Thin  Diet effective now                   EDUCATION NEEDS:   Not appropriate for education at this time  Skin:  Skin Assessment: Reviewed RN Assessment (incision R upper flank)  Last BM:  12/13/20  Height:   Ht Readings from Last 1 Encounters:  12/12/20 5' 8"  (1.727 m)    Weight:   Wt Readings from Last 1 Encounters:  12/12/20 68.2 kg    BMI:  Body mass index is 22.86 kg/m.  Estimated Nutritional Needs:  Kcal:  2000-2200  Protein:  100-115 grams  Fluid:  >/= 2.0 L    Gustavus Bryant, MS, RD, LDN Inpatient Clinical Dietitian Please see AMiON for contact information.

## 2020-12-15 ENCOUNTER — Ambulatory Visit (HOSPITAL_BASED_OUTPATIENT_CLINIC_OR_DEPARTMENT_OTHER): Admission: RE | Admit: 2020-12-15 | Payer: Medicare Other | Source: Ambulatory Visit | Admitting: Surgery

## 2020-12-15 ENCOUNTER — Encounter (HOSPITAL_BASED_OUTPATIENT_CLINIC_OR_DEPARTMENT_OTHER): Admission: RE | Payer: Self-pay | Source: Ambulatory Visit

## 2020-12-15 ENCOUNTER — Inpatient Hospital Stay (HOSPITAL_COMMUNITY): Payer: Medicare Other

## 2020-12-15 DIAGNOSIS — C250 Malignant neoplasm of head of pancreas: Secondary | ICD-10-CM | POA: Diagnosis not present

## 2020-12-15 DIAGNOSIS — A419 Sepsis, unspecified organism: Secondary | ICD-10-CM | POA: Diagnosis not present

## 2020-12-15 DIAGNOSIS — K810 Acute cholecystitis: Secondary | ICD-10-CM | POA: Diagnosis not present

## 2020-12-15 DIAGNOSIS — I10 Essential (primary) hypertension: Secondary | ICD-10-CM | POA: Diagnosis not present

## 2020-12-15 LAB — GLUCOSE, CAPILLARY
Glucose-Capillary: 110 mg/dL — ABNORMAL HIGH (ref 70–99)
Glucose-Capillary: 129 mg/dL — ABNORMAL HIGH (ref 70–99)
Glucose-Capillary: 132 mg/dL — ABNORMAL HIGH (ref 70–99)
Glucose-Capillary: 82 mg/dL (ref 70–99)
Glucose-Capillary: 98 mg/dL (ref 70–99)

## 2020-12-15 SURGERY — INSERTION, TUNNELED CENTRAL VENOUS DEVICE, WITH PORT
Anesthesia: General

## 2020-12-15 MED ORDER — HYOSCYAMINE SULFATE ER 0.375 MG PO TB12
0.3750 mg | ORAL_TABLET | Freq: Two times a day (BID) | ORAL | Status: DC
  Administered 2020-12-15 – 2020-12-23 (×16): 0.375 mg via ORAL
  Filled 2020-12-15 (×19): qty 1

## 2020-12-15 MED ORDER — ENOXAPARIN SODIUM 40 MG/0.4ML IJ SOSY
40.0000 mg | PREFILLED_SYRINGE | INTRAMUSCULAR | Status: DC
  Administered 2020-12-15 – 2020-12-20 (×6): 40 mg via SUBCUTANEOUS
  Filled 2020-12-15 (×8): qty 0.4

## 2020-12-15 NOTE — Progress Notes (Signed)
Subjective: Patient continues to episodic pain in his LUQ.  He has no pain in his RUQ at all.  This pain in his LUQ comes every 20 or so minutes and lasts for 10-20 seconds and stops.  It can be exacerbated by drinking and eating.  No nausea or vomiting.    ROS: See above, otherwise other systems negative  Objective: Vital signs in last 24 hours: Temp:  [97.5 F (36.4 C)-98.8 F (37.1 C)] 98.3 F (36.8 C) (09/26 0502) Pulse Rate:  [72-97] 72 (09/26 0502) Resp:  [14-19] 14 (09/26 0502) BP: (113-135)/(73-84) 135/84 (09/26 0502) SpO2:  [96 %-97 %] 96 % (09/26 0502) Last BM Date: 12/14/20  Intake/Output from previous day: 09/25 0701 - 09/26 0700 In: 1597 [P.O.:500; I.V.:1097] Out: 1150 [Urine:1150] Intake/Output this shift: No intake/output data recorded.  PE: Heart: regular Lungs: CTAB Abd: soft, NT, ND, +BS   Lab Results:  Recent Labs    12/13/20 0546 12/14/20 0645  WBC 15.8* 16.3*  HGB 9.3* 9.4*  HCT 27.7* 27.7*  PLT 263 253   BMET Recent Labs    12/13/20 0546 12/14/20 0645  NA 136 143  K 3.2* 3.8  CL 108 114*  CO2 22 19*  GLUCOSE 135* 113*  BUN 22 17  CREATININE 1.16 1.28*  CALCIUM 7.9* 8.1*   PT/INR Recent Labs    12/13/20 0546  LABPROT 14.0  INR 1.1   CMP     Component Value Date/Time   NA 143 12/14/2020 0645   K 3.8 12/14/2020 0645   CL 114 (H) 12/14/2020 0645   CO2 19 (L) 12/14/2020 0645   GLUCOSE 113 (H) 12/14/2020 0645   BUN 17 12/14/2020 0645   CREATININE 1.28 (H) 12/14/2020 0645   CREATININE 1.07 12/10/2020 1309   CALCIUM 8.1 (L) 12/14/2020 0645   PROT 5.5 (L) 12/14/2020 0645   ALBUMIN 2.1 (L) 12/14/2020 0645   AST 37 12/14/2020 0645   AST 41 12/10/2020 1309   ALT 35 12/14/2020 0645   ALT 57 (H) 12/10/2020 1309   ALKPHOS 98 12/14/2020 0645   BILITOT 2.7 (H) 12/14/2020 0645   BILITOT 4.9 (HH) 12/10/2020 1309   GFRNONAA 60 (L) 12/14/2020 0645   GFRNONAA >60 12/10/2020 1309   Lipase     Component Value Date/Time    LIPASE 16 12/12/2020 0653       Studies/Results: No results found.  Anti-infectives: Anti-infectives (From admission, onward)    Start     Dose/Rate Route Frequency Ordered Stop   12/12/20 1800  metroNIDAZOLE (FLAGYL) IVPB 500 mg  Status:  Discontinued        500 mg 100 mL/hr over 60 Minutes Intravenous Every 8 hours 12/12/20 1214 12/12/20 1214   12/12/20 1400  piperacillin-tazobactam (ZOSYN) IVPB 3.375 g        3.375 g 12.5 mL/hr over 240 Minutes Intravenous Every 8 hours 12/12/20 1142     12/12/20 1300  ceFEPIme (MAXIPIME) 2 g in sodium chloride 0.9 % 100 mL IVPB  Status:  Discontinued        2 g 200 mL/hr over 30 Minutes Intravenous Every 12 hours 12/12/20 1138 12/12/20 1138   12/12/20 1000  cefTRIAXone (ROCEPHIN) 2 g in sodium chloride 0.9 % 100 mL IVPB        2 g 200 mL/hr over 30 Minutes Intravenous  Once 12/12/20 0951 12/12/20 1024   12/12/20 1000  metroNIDAZOLE (FLAGYL) IVPB 500 mg        500  mg 100 mL/hr over 60 Minutes Intravenous  Once 12/12/20 1761 12/12/20 1130        Assessment/Plan Pancreatic head cancer with obstructed CBD and possible cholecystitis  -cont abx therapy for possible cholecystitis although his symptoms are not c/w this at this time.  It is an odd picture.  It is possible he is having some esophageal or intestinal spasm with how he describes these episodic type symptoms.  Will add hyoscyamine today to see if this helps some of his symptoms. -will also relook over CT scan with MD and likely IR who has been following to make sure there isn't something else we are missing.  Occasionally we will have some people have L sided symptoms with their gallbladder.  Given his WBC is still up, despite zosyn, it may be that the patient needs a perc chole drain to rule out cholecystitis at the source.   -cont to try diet although this seems to exacerbate his symptoms so he really isn't eating much. -LFTs normalized except TB but that's down to 2.8  FEN - FLD/IVFs  per TRH VTE - ok for chemical prophylaxis from our standpoint ID - zosyn  DM HTN HLD PCM - albumin is 2.1 AKI - cr 1.28   LOS: 3 days    Henreitta Cea , [facility] Surgery 12/15/2020, 8:44 AM Please see Amion for pager number during day hours 7:00am-4:30pm or 7:00am -11:30am on weekends

## 2020-12-15 NOTE — Progress Notes (Signed)
PROGRESS NOTE    Francisco Frank  DQQ:[id number] DOB: [birth year] DOA: 12/12/2020 PCP: Kennieth Rad, MD    Chief Complaint  Patient presents with   Abdominal Pain    Brief Narrative:  Francisco Frank is a 70 y.o. male with medical history significant for recently diagnosed pancreatic cancer complicated by biliary duct obstruction with abdominal pain, associated with nausea, vomiting for over a month. Imaging showed acute cholecystitis, he was started on IV antibiotics.   Assessment & Plan:   Principal Problem:   Acute cholecystitis Active Problems:   HTN (hypertension)   Type 2 diabetes mellitus without complication, with long-term current use of insulin (HCC)   CAD in native artery   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 (HCC)   Cancer of head of pancreas (HCC)   Sepsis sec to acute cholecystitis: -sepsis physiology has improved. Pt reports his abd pain has improved but not resolved yet.  No nausea, or vomiting or diarrhea. CT scan shows inflammation and stranding in the right upper quadrant surrounding the gallbladder.   He was started on zosyn, continue the same.  Gen surgery consulted, recommended percutaneous cholecystostomy or cholecystectomy if he fails to improve with IV antibiotics.  Blood cultures are pending. Afebrile but persistent leukocysis.  In view of his persistent abd pain, RUQ ordered for further eval which was non diagnostic.  HIDA scan ordered for further evaluation.  IR consulted to see if he needs a perc drain and cholecystostomy.    Diabetes Mellitus: Insulin dependent, with hyperglycemia CBG (last 3)  Recent Labs    12/14/20 2109 12/15/20 0719 12/15/20 1140  GLUCAP 174* 129* 110*    Resume SSI and lantus , no change in meds.    Hypokalemia and hypomagnesemia  Replaced.    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    CAD:  Was on plavix and statin at home. Plavix on hold just in case he needs surgery. .  Continue with statin.    Anemia of chronic disease:  Baseline hemoglobin between 10 to 12.  Currently at 9.4,  Transfuse to keep hemoglobin greater than 8.    AKI with mild metabolic acidosis.  Creatinine of 1.28.    DVT prophylaxis: (Lovenox) Code Status: (Full Code) Family Communication: family at bedside.  Disposition:   Status is: Inpatient  Remains inpatient appropriate because:Ongoing diagnostic testing needed not appropriate for outpatient work up, Unsafe d/c plan, and IV treatments appropriate due to intensity of illness or inability to take PO  Dispo: The patient is from: Home              Anticipated d/c is to: Home              Patient currently is not medically stable to d/c.   Difficult to place patient No       Consultants:  Gen surgery IR  Procedures: RUQ [place] HIDA SCAN. Marland Kitchen   Antimicrobials:  Antibiotics Given (last 72 hours)     Date/Time Action Medication Dose Rate   12/12/20 1354 New Bag/Given   piperacillin-tazobactam (ZOSYN) IVPB 3.375 g 3.375 g 12.5 mL/hr   12/12/20 2129 New Bag/Given   piperacillin-tazobactam (ZOSYN) IVPB 3.375 g 3.375 g 12.5 mL/hr   12/13/20 0543 New Bag/Given   piperacillin-tazobactam (ZOSYN) IVPB 3.375 g 3.375 g 12.5 mL/hr   12/13/20 1535 New Bag/Given  piperacillin-tazobactam (ZOSYN) IVPB 3.375 g 3.375 g 12.5 mL/hr   12/13/20 2234 New Bag/Given   piperacillin-tazobactam (ZOSYN) IVPB 3.375 g 3.375 g 12.5 mL/hr   12/14/20 0541 New Bag/Given   piperacillin-tazobactam (ZOSYN) IVPB 3.375 g 3.375 g 12.5 mL/hr   12/14/20 1413 New Bag/Given   piperacillin-tazobactam (ZOSYN) IVPB 3.375 g 3.375 g 12.5 mL/hr   12/14/20 2146 New Bag/Given   piperacillin-tazobactam (ZOSYN) IVPB 3.375 g 3.375 g 12.5 mL/hr   12/15/20 0523 New Bag/Given   piperacillin-tazobactam (ZOSYN) IVPB  3.375 g 3.375 g 12.5 mL/hr         Subjective: Persistent abd pain. No nausea, vomiting.    Objective: Vitals:   12/14/20 0516 12/14/20 1515 12/14/20 2003 12/15/20 0502  BP: 132/88 125/81 113/73 135/84  Pulse: 82 97 75 72  Resp: 16 19 16 14   Temp: 98.9 F (37.2 C) (!) 97.5 F (36.4 C) 98.8 F (37.1 C) 98.3 F (36.8 C)  TempSrc: Oral Oral Oral Oral  SpO2: 95% 96% 97% 96%  Weight:      Height:        Intake/Output Summary (Last 24 hours) at 12/15/2020 1320 Last data filed at 12/15/2020 0857 Gross per 24 hour  Intake 1597.04 ml  Output 1300 ml  Net 297.04 ml    Filed Weights   12/12/20 2000  Weight: 68.2 kg    Examination: General exam: Appears calm and comfortable  Respiratory system: Clear to auscultation. Respiratory effort normal. Cardiovascular system: S1 & S2 heard, RRR. No JVD, No pedal edema. Gastrointestinal system: Abdomen is soft, with generalized abd pain, bowel sounds heard Central nervous system: Alert and oriented. No focal neurological deficits. Extremities: Symmetric 5 x 5 power. Skin: No rashes, lesions or ulcers Psychiatry:  Mood & affect appropriate.       Data Reviewed: I have personally reviewed following labs and imaging studies  CBC: Recent Labs  Lab 12/10/20 1309 12/12/20 0653 12/13/20 0546 12/14/20 0645  WBC 20.0* 16.0* 15.8* 16.3*  NEUTROABS 15.9* 13.4*  --   --   HGB 11.2* 10.1* 9.3* 9.4*  HCT 33.6* 30.8* 27.7* 27.7*  MCV 92.1 92.8 92.3 92.3  PLT 327 308 263 253     Basic Metabolic Panel: Recent Labs  Lab 12/10/20 1309 12/12/20 0653 12/13/20 0546 12/14/20 0645  NA 137 135 136 143  K 3.7 3.1* 3.2* 3.8  CL 103 99 108 114*  CO2 24 20* 22 19*  GLUCOSE 232* 259* 135* 113*  BUN 19 31* 22 17  CREATININE 1.07 1.44* 1.16 1.28*  CALCIUM 9.6 8.9 7.9* 8.1*  MG  --  1.8  --   --      GFR: Estimated Creatinine Clearance: 51.1 mL/min (A) (by C-G formula based on SCr of 1.28 mg/dL (H)).  Liver Function  Tests: Recent Labs  Lab 12/10/20 1309 12/11/20 1117 12/12/20 0653 12/13/20 0546 12/14/20 0645  AST 41  --  35 33 37  ALT 57*  --  46* 36 35  ALKPHOS 130*  --  122 108 98  BILITOT 4.9* 4.6* 4.0* 2.8* 2.7*  PROT 7.0  --  6.9 5.6* 5.5*  ALBUMIN 3.6  --  2.6* 2.2* 2.1*     CBG: Recent Labs  Lab 12/14/20 1202 12/14/20 1653 12/14/20 2109 12/15/20 0719 12/15/20 1140  GLUCAP 98 142* 174* 129* 110*      Recent Results (from the past 240 hour(s))  Blood culture (routine x 2)     Status: None (Preliminary result)   Collection  Time: 12/12/20  6:55 AM   Specimen: BLOOD RIGHT HAND  Result Value Ref Range Status   Specimen Description   Final    BLOOD RIGHT HAND Performed at [facility] [address], [place], [place] [address]    Special Requests   Final    BOTTLES DRAWN AEROBIC AND ANAEROBIC Blood Culture adequate volume Performed at [facility] [address] [place], [place] [address]    Culture   Final    NO GROWTH 3 DAYS Performed at [facility], [place] [address], [place], [place] [address]    Report Status PENDING  Incomplete  Blood culture (routine x 2)     Status: None (Preliminary result)   Collection Time: 12/12/20  7:44 AM   Specimen: BLOOD  Result Value Ref Range Status   Specimen Description   Final    BLOOD LEFT ANTECUBITAL Performed at [facility] [address] [place], [place] [address]    Special Requests   Final    BOTTLES DRAWN AEROBIC AND ANAEROBIC Blood Culture results may not be optimal due to an excessive volume of blood received in culture bottles Performed at [facility] [address], [place], [place] [address]    Culture   Final    NO GROWTH 3 DAYS Performed at [facility], [place] [address], [place], [place] [address]    Report Status PENDING  Incomplete  Resp Panel by RT-PCR (Flu A&B, Covid) Nasopharyngeal Swab     Status: None   Collection  Time: 12/12/20 10:24 AM   Specimen: Nasopharyngeal Swab; Nasopharyngeal(NP) swabs in vial transport medium  Result Value Ref Range Status   SARS Coronavirus 2 by RT PCR NEGATIVE NEGATIVE Final    Comment: (NOTE) SARS-CoV-2 target nucleic acids are NOT DETECTED.  The SARS-CoV-2 RNA is generally detectable in upper respiratory specimens during the acute phase of infection. The lowest concentration of SARS-CoV-2 viral copies this assay can detect is 138 copies/mL. A negative result does not preclude SARS-Cov-2 infection and should not be used as the sole basis for treatment or other patient management decisions. A negative result may occur with  improper specimen collection/handling, submission of specimen other than nasopharyngeal swab, presence of viral mutation(s) within the areas targeted by this assay, and inadequate number of viral copies(<138 copies/mL). A negative result must be combined with clinical observations, patient history, and epidemiological information. The expected result is Negative.  Fact Sheet for Patients:  EntrepreneurPulse.com.au  Fact Sheet for Healthcare Providers:  IncredibleEmployment.be  This test is no t yet approved or cleared by the [place] FDA and  has been authorized for detection and/or diagnosis of SARS-CoV-2 by FDA under an Emergency Use Authorization (EUA). This EUA will remain  in effect (meaning this test can be used) for the duration of the COVID-19 declaration under Section 564(b)(1) of the Act, 21 U.S.C.section 360bbb-3(b)(1), unless the authorization is terminated  or revoked sooner.       Influenza A by PCR NEGATIVE NEGATIVE Final   Influenza B by PCR NEGATIVE NEGATIVE Final    Comment: (NOTE) The Xpert Xpress SARS-CoV-2/FLU/RSV plus assay is intended as an aid in the diagnosis of influenza from Nasopharyngeal swab specimens and should not be used as a sole basis for treatment. Nasal washings  and aspirates are unacceptable for Xpert Xpress SARS-CoV-2/FLU/RSV testing.  Fact Sheet for Patients: EntrepreneurPulse.com.au  Fact Sheet for Healthcare Providers: IncredibleEmployment.be  This test is not yet  approved or cleared by the [place] and has been authorized for detection and/or diagnosis of SARS-CoV-2 by FDA under an Emergency Use Authorization (EUA). This EUA will remain in effect (meaning this test can be used) for the duration of the COVID-19 declaration under Section 564(b)(1) of the Act, 21 U.S.C. section 360bbb-3(b)(1), unless the authorization is terminated or revoked.  Performed at [facility], [place] [address], [place], [place] [address]           Radiology Studies: No results found.      Scheduled Meds:  atorvastatin  40 mg Oral Daily   feeding supplement  237 mL Oral TID BM   hyoscyamine  0.375 mg Oral Q12H   insulin aspart  0-15 Units Subcutaneous TID WC   insulin glargine  7 Units Subcutaneous QHS   Continuous Infusions:  0.9 % NaCl with KCl 20 mEq / L 75 mL/hr at 12/15/20 0725   piperacillin-tazobactam (ZOSYN)  IV 3.375 g (12/15/20 0523)     LOS: 3 days        Hosie Poisson, MD [facility]   To contact the attending provider between 7A-7P or the covering provider during after hours 7P-7A, please log into the web site [email or web address] and access using universal [place] password for that web site. If you do not have the password, please call the hospital operator.  12/15/2020, 1:20 PM

## 2020-12-15 NOTE — Progress Notes (Signed)
Patient ID: Francisco Frank, male   DOB: Apr 28, 1950, 70 y.o.   MRN: [id number] Aware of request for consideration of perc cholecystostomy on pt. Case was reviewed by Dr. Dwaine Gale. Latest abd US shows some GB wall thickening but overall clinical picture not totally c/w cholecystitis per Dr. Dwaine Gale; recommend HIDA scan before pursuing any further intervention. Dr. Karleen Hampshire updated.

## 2020-12-15 NOTE — Care Management Important Message (Signed)
Important Message  Patient Details IM Letter given to the Patient. Name: Francisco Frank MRN: [id number] Date of Birth: March 09, 1951   Medicare Important Message Given:  Yes     Kerin Salen 12/15/2020, 12:42 PM

## 2020-12-15 NOTE — Progress Notes (Signed)
Patient had uneventful night. Pain well controlled with dilaudid by mouth. No pain/discomfort noted at rest.

## 2020-12-16 ENCOUNTER — Inpatient Hospital Stay (HOSPITAL_COMMUNITY): Payer: Medicare Other

## 2020-12-16 ENCOUNTER — Encounter: Payer: Self-pay | Admitting: Genetic Counselor

## 2020-12-16 DIAGNOSIS — A419 Sepsis, unspecified organism: Secondary | ICD-10-CM | POA: Diagnosis not present

## 2020-12-16 DIAGNOSIS — Z1379 Encounter for other screening for genetic and chromosomal anomalies: Secondary | ICD-10-CM | POA: Insufficient documentation

## 2020-12-16 DIAGNOSIS — I10 Essential (primary) hypertension: Secondary | ICD-10-CM | POA: Diagnosis not present

## 2020-12-16 DIAGNOSIS — C250 Malignant neoplasm of head of pancreas: Secondary | ICD-10-CM | POA: Diagnosis not present

## 2020-12-16 DIAGNOSIS — K810 Acute cholecystitis: Secondary | ICD-10-CM | POA: Diagnosis not present

## 2020-12-16 LAB — COMPREHENSIVE METABOLIC PANEL
ALT: 30 U/L (ref 0–44)
AST: 34 U/L (ref 15–41)
Albumin: 2.1 g/dL — ABNORMAL LOW (ref 3.5–5.0)
Alkaline Phosphatase: 99 U/L (ref 38–126)
Anion gap: 8 (ref 5–15)
BUN: 12 mg/dL (ref 8–23)
CO2: 23 mmol/L (ref 22–32)
Calcium: 8.2 mg/dL — ABNORMAL LOW (ref 8.9–10.3)
Chloride: 108 mmol/L (ref 98–111)
Creatinine, Ser: 0.91 mg/dL (ref 0.61–1.24)
GFR, Estimated: >60 mL/min (ref >60)
Glucose, Bld: 105 mg/dL — ABNORMAL HIGH (ref 70–99)
Potassium: 4.1 mmol/L (ref 3.5–5.1)
Sodium: 139 mmol/L (ref 135–145)
Total Bilirubin: 2.2 mg/dL — ABNORMAL HIGH (ref 0.3–1.2)
Total Protein: 5.2 g/dL — ABNORMAL LOW (ref 6.5–8.1)

## 2020-12-16 LAB — GLUCOSE, CAPILLARY
Glucose-Capillary: 111 mg/dL — ABNORMAL HIGH (ref 70–99)
Glucose-Capillary: 113 mg/dL — ABNORMAL HIGH (ref 70–99)
Glucose-Capillary: 157 mg/dL — ABNORMAL HIGH (ref 70–99)
Glucose-Capillary: 140 mg/dL — ABNORMAL HIGH (ref 70–99)

## 2020-12-16 LAB — CBC WITH DIFFERENTIAL/PLATELET
Abs Immature Granulocytes: 0.98 10*3/uL — ABNORMAL HIGH (ref 0.00–0.07)
Basophils Absolute: 0.1 10*3/uL (ref 0.0–0.1)
Basophils Relative: 1 %
Eosinophils Absolute: 0.8 10*3/uL — ABNORMAL HIGH (ref 0.0–0.5)
Eosinophils Relative: 5 %
HCT: 29.7 % — ABNORMAL LOW (ref 39.0–52.0)
Hemoglobin: 9.8 g/dL — ABNORMAL LOW (ref 13.0–17.0)
Immature Granulocytes: 6 %
Lymphocytes Relative: 17 %
Lymphs Abs: 2.7 10*3/uL (ref 0.7–4.0)
MCH: 30.7 pg (ref 26.0–34.0)
MCHC: 33 g/dL (ref 30.0–36.0)
MCV: 93.1 fL (ref 80.0–100.0)
Monocytes Absolute: 1.9 10*3/uL — ABNORMAL HIGH (ref 0.1–1.0)
Monocytes Relative: 12 %
Neutro Abs: 9.7 10*3/uL — ABNORMAL HIGH (ref 1.7–7.7)
Neutrophils Relative %: 59 %
Platelets: 269 10*3/uL (ref 150–400)
RBC: 3.19 MIL/uL — ABNORMAL LOW (ref 4.22–5.81)
RDW: 17.2 % — ABNORMAL HIGH (ref 11.5–15.5)
WBC: 16.1 10*3/uL — ABNORMAL HIGH (ref 4.0–10.5)
nRBC: 0 % (ref 0.0–0.2)

## 2020-12-16 IMAGING — NM NM HEPATO W/GB/PHARM/[PERSON_NAME]
3 series · 18 of 18 positions shown · non-contrast
Comparison: None.

CLINICAL DATA: Left-sided abdominal pain for several months.

EXAM:
NUCLEAR MEDICINE HEPATOBILIARY IMAGING
TECHNIQUE: Sequential images of the abdomen were obtained [DATE] minutes
following intravenous administration of radiopharmaceutical. Due to
nonvisualization of gallbladder at 60 minutes, 3 mg of morphine was
infused intravenously, and imaging continued for another 30 minutes.
RADIOPHARMACEUTICALS:  7.7 mCi [8A]  Choletec IV

[Series 1: hida scan · 3.28mm/px · 6 of 30 frames shown (1 of 2)]
[frame 3/30]
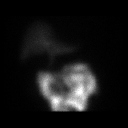
[frame 8/30]
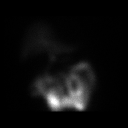
[frame 13/30]
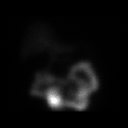
[frame 18/30]
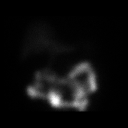
[frame 23/30]
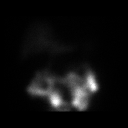
[frame 28/30]
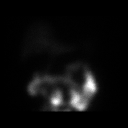

[Series 1: hida 30 min · 3.28mm/px · 6 of 30 frames shown]
[frame 3/30]
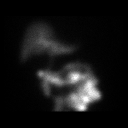
[frame 8/30]
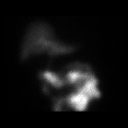
[frame 13/30]
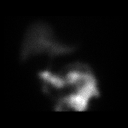
[frame 18/30]
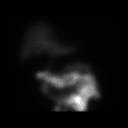
[frame 23/30]
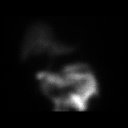
[frame 28/30]
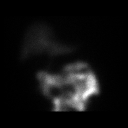

[Series 1: hida scan · 3.28mm/px · 6 of 60 frames shown (2 of 2)]
[frame 6/60]
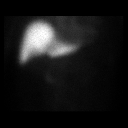
[frame 16/60]
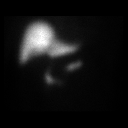
[frame 26/60]
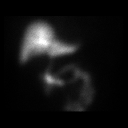
[frame 36/60]
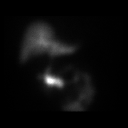
[frame 46/60]
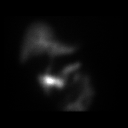
[frame 56/60]
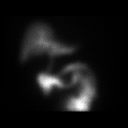

[18 of 18 positions shown; findings below may reference images not displayed]

FINDINGS: Prompt uptake and biliary excretion of activity by the liver is
seen. Biliary activity passes into small bowel, consistent with
patent common bile duct. However, no gallbladder activity is seen
either before or following morphine administration, consistent with
cystic duct obstruction.
IMPRESSION: Absent gallbladder activity, consistent with cystic duct
obstruction/acute cholecystitis.

No evidence of biliary obstruction.

## 2020-12-16 MED ORDER — MORPHINE SULFATE 4 MG/ML IJ SOLN
3.0000 mg | Freq: Once | INTRAMUSCULAR | Status: AC
  Filled 2020-12-16: qty 2

## 2020-12-16 MED ORDER — CELECOXIB 200 MG PO CAPS
200.0000 mg | ORAL_CAPSULE | Freq: Once | ORAL | Status: DC
Start: 2020-12-16 — End: 2020-12-23
  Filled 2020-12-16: qty 1

## 2020-12-16 MED ORDER — TECHNETIUM TC 99M MEBROFENIN IV KIT
7.6500 | PACK | Freq: Once | INTRAVENOUS | Status: AC | PRN
  Administered 2020-12-16: 7.65 via INTRAVENOUS

## 2020-12-16 MED ORDER — ACETAMINOPHEN 500 MG PO TABS
1000.0000 mg | ORAL_TABLET | Freq: Once | ORAL | Status: DC
Start: 2020-12-16 — End: 2020-12-18
  Filled 2020-12-16: qty 2

## 2020-12-16 MED ORDER — MORPHINE SULFATE (PF) 4 MG/ML IV SOLN
INTRAVENOUS | Status: AC
  Administered 2020-12-16: 3 mg via INTRAVENOUS
  Filled 2020-12-16: qty 1

## 2020-12-16 NOTE — Progress Notes (Signed)
Subjective: Patient states his pain is different today and that the hyoscyamine has helped the symptoms he was having, but today his pain is a little more up in his chest/epigastrium.  He denies N/V.  He was able to eat some lunch today after his HIDA and keep it down.  ROS: See above, otherwise other systems negative  Objective: Vital signs in last 24 hours: Temp:  [98 F (36.7 C)-99.4 F (37.4 C)] 98.1 F (36.7 C) (09/27 0524) Pulse Rate:  [45-85] 81 (09/27 0524) Resp:  [16-18] 18 (09/27 0524) BP: (132-157)/(78-94) 132/91 (09/27 0524) SpO2:  [94 %-98 %] 94 % (09/27 0524) Last BM Date: 12/14/20  Intake/Output from previous day: 09/26 0701 - 09/27 0700 In: 547.3 [P.O.:240; IV Piggyback:307.3] Out: 1450 [Urine:1450] Intake/Output this shift: Total I/O In: -  Out: 300 [Urine:300]  PE: Abd: soft, mildly tender in epigastrium and slightly in LUQ, ND, +BS   Lab Results:  Recent Labs    12/14/20 0645 12/16/20 0559  WBC 16.3* 16.1*  HGB 9.4* 9.8*  HCT 27.7* 29.7*  PLT 253 269   BMET Recent Labs    12/14/20 0645 12/16/20 0559  NA 143 139  K 3.8 4.1  CL 114* 108  CO2 19* 23  GLUCOSE 113* 105*  BUN 17 12  CREATININE 1.28* 0.91  CALCIUM 8.1* 8.2*   PT/INR No results for input(s): LABPROT, INR in the last 72 hours.  CMP     Component Value Date/Time   NA 139 12/16/2020 0559   K 4.1 12/16/2020 0559   CL 108 12/16/2020 0559   CO2 23 12/16/2020 0559   GLUCOSE 105 (H) 12/16/2020 0559   BUN 12 12/16/2020 0559   CREATININE 0.91 12/16/2020 0559   CREATININE 1.07 12/10/2020 1309   CALCIUM 8.2 (L) 12/16/2020 0559   PROT 5.2 (L) 12/16/2020 0559   ALBUMIN 2.1 (L) 12/16/2020 0559   AST 34 12/16/2020 0559   AST 41 12/10/2020 1309   ALT 30 12/16/2020 0559   ALT 57 (H) 12/10/2020 1309   ALKPHOS 99 12/16/2020 0559   BILITOT 2.2 (H) 12/16/2020 0559   BILITOT 4.9 (HH) 12/10/2020 1309   GFRNONAA >60 12/16/2020 0559   GFRNONAA >60 12/10/2020 1309   Lipase      Component Value Date/Time   LIPASE 16 12/12/2020 0653       Studies/Results: NM Hepato W/EF  Result Date: 12/16/2020 CLINICAL DATA:  Left-sided abdominal pain for several months. EXAM: NUCLEAR MEDICINE HEPATOBILIARY IMAGING TECHNIQUE: Sequential images of the abdomen were obtained out to 60 minutes following intravenous administration of radiopharmaceutical. Due to nonvisualization of gallbladder at 60 minutes, 3 mg of morphine was infused intravenously, and imaging continued for another 30 minutes. RADIOPHARMACEUTICALS:  7.7 mCi Tc-18m  Choletec IV COMPARISON:  None. FINDINGS: Prompt uptake and biliary excretion of activity by the liver is seen. Biliary activity passes into small bowel, consistent with patent common bile duct. However, no gallbladder activity is seen either before or following morphine administration, consistent with cystic duct obstruction. IMPRESSION: Absent gallbladder activity, consistent with cystic duct obstruction/acute cholecystitis. No evidence of biliary obstruction. Electronically Signed   By: Marlaine Hind M.D.   On: 12/16/2020 13:54   US Abdomen Limited  Result Date: 12/15/2020 CLINICAL DATA:  Possible cholecystitis seen on CT. Left upper quadrant epigastric abdominal pain. EXAM: ULTRASOUND ABDOMEN LIMITED RIGHT UPPER QUADRANT COMPARISON:  None. FINDINGS: Gallbladder: Air again seen within the gallbladder lumen consistent with biliary stent. 1.1 cm nonshadowing  echogenic structure within the gallbladder lumen likely sludge. Polyp considered less likely given lack of internal flow. Mild gallbladder wall thickening measuring up to 4 mm. Small amount of pericholecystic fluid is present. Sonographic Percell Miller sign is negative per technologist. Common bile duct: Diameter: 4 mm Liver: No focal hepatic lesion. Pneumobilia again seen throughout the liver consistent with CBD stent. Portal vein is patent on color Doppler imaging with normal direction of blood flow towards the liver.  Other: None. IMPRESSION: 1. Mild gallbladder wall thickening and pericholecystic fluid. Given that the patient's pain is located in the left abdomen and epigastric region, findings are unlikely to be related to acute cholecystitis. Findings could be related to chronic cholecystitis which could be better evaluated with HIDA scan. 2. 1.1 cm nonshadowing echogenic structure within the proximal gallbladder lumen is favored to be sludge given lack of significant internal flow. Follow-up ultrasound should be performed in 6 months to document stability/resolution. Electronically Signed   By: Miachel Roux M.D.   On: 12/15/2020 16:08    Anti-infectives: Anti-infectives (From admission, onward)    Start     Dose/Rate Route Frequency Ordered Stop   12/12/20 1800  metroNIDAZOLE (FLAGYL) IVPB 500 mg  Status:  Discontinued        500 mg 100 mL/hr over 60 Minutes Intravenous Every 8 hours 12/12/20 1214 12/12/20 1214   12/12/20 1400  piperacillin-tazobactam (ZOSYN) IVPB 3.375 g        3.375 g 12.5 mL/hr over 240 Minutes Intravenous Every 8 hours 12/12/20 1142     12/12/20 1300  ceFEPIme (MAXIPIME) 2 g in sodium chloride 0.9 % 100 mL IVPB  Status:  Discontinued        2 g 200 mL/hr over 30 Minutes Intravenous Every 12 hours 12/12/20 1138 12/12/20 1138   12/12/20 1000  cefTRIAXone (ROCEPHIN) 2 g in sodium chloride 0.9 % 100 mL IVPB        2 g 200 mL/hr over 30 Minutes Intravenous  Once 12/12/20 0951 12/12/20 1024   12/12/20 1000  metroNIDAZOLE (FLAGYL) IVPB 500 mg        500 mg 100 mL/hr over 60 Minutes Intravenous  Once 12/12/20 4650 12/12/20 1130        Assessment/Plan Pancreatic head cancer with obstructed CBD, s/p stenting and cholecystitis  -cont abx therapy for cholecystitis  -HIDA confirmed cholecystitis today.  Will have IR place perc chole drain as to not delay time to chemotherapy. -hyoscyamine did help with some of his episodic type pain symptoms from previously as well.  Cont this for  now. -NPO p MN for hopeful drain placement tomorrow. -TB down to 2.2, WBC still 16K  FEN - FLD/IVFs per TRH, NPO p MN VTE - Lovenox ID - zosyn  DM HTN HLD PCM - albumin is 2.1 AKI - cr 1.28   LOS: 4 days    Henreitta Cea , [facility] 12/16/2020, 2:24 PM Please see Amion for pager number during day hours 7:00am-4:30pm or 7:00am -11:30am on weekends

## 2020-12-16 NOTE — Progress Notes (Signed)
PROGRESS NOTE    Francisco Frank  VZD:[id number] DOB: 01/03/1951 DOA: 12/12/2020 PCP: Kennieth Rad, MD    Chief Complaint  Patient presents with   Abdominal Pain    Brief Narrative:  Francisco Frank is a 70 y.o. male with medical history significant for recently diagnosed pancreatic cancer complicated by biliary duct obstruction with abdominal pain, associated with nausea, vomiting for over a month. Imaging showed acute cholecystitis, he was started on IV antibiotics and IV Dilaudid.  Patient reports his abdominal pain has not improved despite antibiotics and IV Dilaudid.  General surgery and IR on board .  HIDA scan done , showed Absent gallbladder activity, consistent with cystic duct obstruction/acute cholecystitis. Plan for percutaneous cholecystostomy by IR.  Assessment & Plan:   Principal Problem:   Acute cholecystitis Active Problems:   HTN (hypertension)   Type 2 diabetes mellitus without complication, with long-term current use of insulin (HCC)   CAD in native artery   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 (HCC)   Cancer of head of pancreas (HCC)   Sepsis sec to acute cholecystitis: -sepsis physiology has improved.  Patient continues to have persistent right upper quadrant and epigastric pain CT scan shows inflammation and stranding in the right upper quadrant surrounding the gallbladder.   He was started on zosyn, continue the same.  Gen surgery consulted, recommended percutaneous cholecystostomy or cholecystectomy if he fails to improve with IV antibiotics.  Patient's abdominal pain continues to worsen, right upper quadrant ultrasound done showed gallbladder wall thickening it was followed by a HIDA scan which showed absent gallbladder activity.  Plan percutaneous cholecystostomy by IR Blood cultures are pending. Afebrile but has persistent leukocytosis.  Pain control with IV Dilaudid    Diabetes Mellitus: Insulin dependent, with hyperglycemia CBG (last  3)  Recent Labs    12/15/20 2354 12/16/20 1026 12/16/20 1241  GLUCAP 98 111* 113*    Resume SSI and lantus , no changes in meds   Hypokalemia and hypomagnesemia  Replaced.    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    CAD:  Was on plavix and statin at home. Plavix on hold just in case he needs surgery. .  Continue with statin.    Anemia of chronic disease:  Baseline hemoglobin between 10 to 12.  Hemoglobin around 9.8 Transfuse to keep hemoglobin greater than 8.    AKI with mild metabolic acidosis.  Resolved with IV fluids.   DVT prophylaxis: (Lovenox) Code Status: (Full Code) Family Communication: None at bedside Disposition:   Status is: Inpatient  Remains inpatient appropriate because:Ongoing diagnostic testing needed not appropriate for outpatient work up, Unsafe d/c plan, and IV treatments appropriate due to intensity of illness or inability to take PO  Dispo: The patient is from: Home              Anticipated d/c is to: Home              Patient currently is not medically stable to d/c.   Difficult to place patient No       Consultants:  Gen surgery IR  Procedures: RUQ [place] HIDA SCAN. Marland Kitchen   Antimicrobials:  Antibiotics Given (last 72 hours)     Date/Time Action Medication Dose Rate   12/13/20 1535 New Bag/Given   piperacillin-tazobactam (ZOSYN) IVPB 3.375 g 3.375 g  12.5 mL/hr   12/13/20 2234 New Bag/Given   piperacillin-tazobactam (ZOSYN) IVPB 3.375 g 3.375 g 12.5 mL/hr   12/14/20 0541 New Bag/Given   piperacillin-tazobactam (ZOSYN) IVPB 3.375 g 3.375 g 12.5 mL/hr   12/14/20 1413 New Bag/Given   piperacillin-tazobactam (ZOSYN) IVPB 3.375 g 3.375 g 12.5 mL/hr   12/14/20 2146 New Bag/Given   piperacillin-tazobactam (ZOSYN) IVPB 3.375 g 3.375 g 12.5 mL/hr    12/15/20 0523 New Bag/Given   piperacillin-tazobactam (ZOSYN) IVPB 3.375 g 3.375 g 12.5 mL/hr   12/15/20 1351 New Bag/Given   piperacillin-tazobactam (ZOSYN) IVPB 3.375 g 3.375 g 12.5 mL/hr   12/15/20 2129 New Bag/Given   piperacillin-tazobactam (ZOSYN) IVPB 3.375 g 3.375 g 12.5 mL/hr   12/16/20 0524 New Bag/Given   piperacillin-tazobactam (ZOSYN) IVPB 3.375 g 3.375 g 12.5 mL/hr   12/16/20 1430 New Bag/Given   piperacillin-tazobactam (ZOSYN) IVPB 3.375 g 3.375 g 12.5 mL/hr         Subjective: Persistent abdominal pain associated with some nausea no vomiting   Objective: Vitals:   12/15/20 0502 12/15/20 1427 12/15/20 2117 12/16/20 0524  BP: 135/84 135/78 (!) 157/94 (!) 132/91  Pulse: 72 (!) 45 85 81  Resp: 14 16 17 18   Temp: 98.3 F (36.8 C) 98 F (36.7 C) 99.4 F (37.4 C) 98.1 F (36.7 C)  TempSrc: Oral Oral Oral Oral  SpO2: 96% 98% 95% 94%  Weight:      Height:        Intake/Output Summary (Last 24 hours) at 12/16/2020 1442 Last data filed at 12/16/2020 0750 Gross per 24 hour  Intake 547.27 ml  Output 1100 ml  Net -552.73 ml    Filed Weights   12/12/20 2000  Weight: 68.2 kg    Examination:   General exam: Appears calm and comfortable  Respiratory system: Clear to auscultation. Respiratory effort normal. Cardiovascular system: S1 & S2 heard, RRR. No JVD,  No pedal edema. Gastrointestinal system: Abdomen is soft, with generalized tenderness, bowel sounds heard Central nervous system: Alert and oriented. No focal neurological deficits. Extremities: Symmetric 5 x 5 power. Skin: No rashes, lesions or ulcers Psychiatry: Mood & affect appropriate.       Data Reviewed: I have personally reviewed following labs and imaging studies  CBC: Recent Labs  Lab 12/10/20 1309 12/12/20 0653 12/13/20 0546 12/14/20 0645 12/16/20 0559  WBC 20.0* 16.0* 15.8* 16.3* 16.1*  NEUTROABS 15.9* 13.4*  --   --  9.7*  HGB 11.2* 10.1* 9.3* 9.4* 9.8*  HCT 33.6* 30.8* 27.7*  27.7* 29.7*  MCV 92.1 92.8 92.3 92.3 93.1  PLT 327 308 263 253 269     Basic Metabolic Panel: Recent Labs  Lab 12/10/20 1309 12/12/20 0653 12/13/20 0546 12/14/20 0645 12/16/20 0559  NA 137 135 136 143 139  K 3.7 3.1* 3.2* 3.8 4.1  CL 103 99 108 114* 108  CO2 24 20* 22 19* 23  GLUCOSE 232* 259* 135* 113* 105*  BUN 19 31* 22 17 12   CREATININE 1.07 1.44* 1.16 1.28* 0.91  CALCIUM 9.6 8.9 7.9* 8.1* 8.2*  MG  --  1.8  --   --   --      GFR: Estimated Creatinine Clearance: 71.8 mL/min (by C-G formula based on SCr of 0.91 mg/dL).  Liver Function Tests: Recent Labs  Lab 12/10/20 1309 12/11/20 1117 12/12/20 0653 12/13/20 0546 12/14/20 0645 12/16/20 0559  AST 41  --  35 33 37 34  ALT 57*  --  46* 36 35 30  ALKPHOS 130*  --  122 108 98 99  BILITOT 4.9* 4.6* 4.0* 2.8* 2.7* 2.2*  PROT 7.0  --  6.9 5.6* 5.5* 5.2*  ALBUMIN 3.6  --  2.6* 2.2* 2.1* 2.1*     CBG: Recent Labs  Lab 12/15/20 1631 12/15/20 2114 12/15/20 2354 12/16/20 1026 12/16/20 1241  GLUCAP 132* 82 98 111* 113*      Recent Results (from the past 240 hour(s))  Blood culture (routine x 2)     Status: None (Preliminary result)   Collection Time: 12/12/20  6:55 AM   Specimen: BLOOD RIGHT HAND  Result Value Ref Range Status   Specimen Description   Final    BLOOD RIGHT HAND Performed at [facility], [place] [address] [place], [place] [address]    Special Requests   Final    BOTTLES DRAWN AEROBIC AND ANAEROBIC Blood Culture adequate volume Performed at [place] [address], [place], [place] [address]    Culture   Final    NO GROWTH 4 DAYS Performed at [facility], [place] [address] [place], [place] [address]    Report Status PENDING  Incomplete  Blood culture (routine x 2)     Status: None (Preliminary result)   Collection Time: 12/12/20  7:44 AM   Specimen: BLOOD  Result Value Ref Range Status   Specimen Description   Final    BLOOD LEFT  ANTECUBITAL Performed at [place] [address], [place], [place] [address]    Special Requests   Final    BOTTLES DRAWN AEROBIC AND ANAEROBIC Blood Culture results may not be optimal due to an excessive volume of blood received in culture bottles Performed at [place] [address], [place], [place] [address]    Culture   Final    NO GROWTH 4 DAYS Performed at [facility], [place] [address] [place], [place] [address]    Report Status PENDING  Incomplete  Resp Panel by RT-PCR (Flu A&B, Covid) Nasopharyngeal Swab     Status: None   Collection Time: 12/12/20 10:24 AM   Specimen: Nasopharyngeal Swab; Nasopharyngeal(NP) swabs in vial transport medium  Result Value Ref Range Status   SARS Coronavirus 2 by RT PCR NEGATIVE NEGATIVE Final    Comment: (NOTE) SARS-CoV-2 target nucleic acids are NOT DETECTED.  The SARS-CoV-2 RNA is generally detectable in upper respiratory specimens during the acute phase of infection. The lowest concentration of SARS-CoV-2 viral copies this assay can detect is 138 copies/mL. A negative result does not preclude SARS-Cov-2 infection and should not be used as the sole basis for treatment or other patient management decisions. A negative result may occur with  improper specimen collection/handling, submission of specimen other than nasopharyngeal swab, presence of viral mutation(s) within the areas targeted by this assay, and inadequate number of viral copies(<138 copies/mL). A negative result must be combined with clinical observations, patient history, and epidemiological information. The expected result is Negative.  Fact Sheet for Patients:  EntrepreneurPulse.com.au  Fact Sheet for Healthcare Providers:  IncredibleEmployment.be  This test is no t yet approved or cleared by the [place] FDA and  has been authorized for detection and/or diagnosis of SARS-CoV-2  by FDA under an Emergency Use Authorization (EUA). This EUA will remain  in effect (meaning this test can be used) for the duration of the COVID-19 declaration under Section 564(b)(1) of the Act, 21 U.S.C.section 360bbb-3(b)(1), unless the authorization is terminated  or revoked sooner.       Influenza A by PCR NEGATIVE NEGATIVE Final   Influenza B by PCR NEGATIVE NEGATIVE Final    Comment: (NOTE) The Xpert Xpress SARS-CoV-2/FLU/RSV plus assay is intended as an aid in the diagnosis of influenza from Nasopharyngeal swab specimens and should not be used as a sole basis for treatment. Nasal washings and aspirates are unacceptable for Xpert Xpress SARS-CoV-2/FLU/RSV testing.  Fact Sheet for Patients: EntrepreneurPulse.com.au  Fact Sheet for Healthcare Providers: IncredibleEmployment.be  This test is not yet approved or cleared by the [place] FDA and has been authorized for detection and/or diagnosis of SARS-CoV-2 by FDA under an Emergency Use Authorization (EUA). This EUA will remain in effect (meaning this test can be used) for the duration of the COVID-19 declaration under Section 564(b)(1) of the Act, 21 U.S.C. section 360bbb-3(b)(1), unless the authorization is terminated or revoked.  Performed at [facility], [place] [address] [place], [place] [address]           Radiology Studies: NM Hepato W/EF  Result Date: 12/16/2020 CLINICAL DATA:  Left-sided abdominal pain for several months. EXAM: NUCLEAR MEDICINE HEPATOBILIARY IMAGING TECHNIQUE: Sequential images of the abdomen were obtained out to 60 minutes following intravenous administration of radiopharmaceutical. Due to nonvisualization of gallbladder at 60 minutes, 3 mg of morphine was infused intravenously, and imaging continued for another 30 minutes. RADIOPHARMACEUTICALS:  7.7 mCi Tc-95m  Choletec IV COMPARISON:  None. FINDINGS: Prompt uptake and biliary  excretion of activity by the liver is seen. Biliary activity passes into small bowel, consistent with patent common bile duct. However, no gallbladder activity is seen either before or following morphine administration, consistent with cystic duct obstruction. IMPRESSION: Absent gallbladder activity, consistent with cystic duct obstruction/acute cholecystitis. No evidence of biliary obstruction. Electronically Signed   By: Marlaine Hind M.D.   On: 12/16/2020 13:54   US Abdomen Limited  Result Date: 12/15/2020 CLINICAL DATA:  Possible cholecystitis seen on CT. Left upper quadrant epigastric abdominal pain. EXAM: ULTRASOUND ABDOMEN LIMITED RIGHT UPPER QUADRANT COMPARISON:  None. FINDINGS: Gallbladder: Air again seen within the gallbladder lumen consistent with biliary stent. 1.1 cm nonshadowing echogenic structure within the gallbladder lumen likely sludge. Polyp considered less likely given lack of internal flow. Mild gallbladder wall thickening measuring up to 4 mm. Small amount of pericholecystic fluid is present. Sonographic Percell Miller sign is negative per technologist. Common bile duct: Diameter: 4 mm Liver: No focal hepatic lesion. Pneumobilia again seen throughout the liver consistent with CBD stent. Portal vein is patent on color Doppler imaging with normal direction of blood flow towards the liver. Other: None. IMPRESSION: 1. Mild gallbladder wall thickening and pericholecystic fluid. Given that the patient's pain is located in the left abdomen and epigastric region, findings are unlikely to be related to acute cholecystitis. Findings could be related to chronic cholecystitis which could be better evaluated with HIDA scan. 2. 1.1 cm nonshadowing echogenic structure within the proximal gallbladder lumen is favored to be sludge given lack of significant internal flow. Follow-up ultrasound should be performed in 6 months to document stability/resolution. Electronically Signed   By: Miachel Roux M.D.   On:  12/15/2020 16:08        Scheduled Meds:  acetaminophen  1,000 mg Oral Once   atorvastatin  40 mg Oral Daily   celecoxib  200 mg Oral Once   enoxaparin (LOVENOX) injection  40 mg Subcutaneous Q24H   feeding supplement  237 mL Oral TID BM  hyoscyamine  0.375 mg Oral Q12H   insulin aspart  0-15 Units Subcutaneous TID WC   insulin glargine  7 Units Subcutaneous QHS   Continuous Infusions:  0.9 % NaCl with KCl 20 mEq / L 75 mL/hr at 12/15/20 1924   piperacillin-tazobactam (ZOSYN)  IV 3.375 g (12/16/20 1430)     LOS: 4 days        Hosie Poisson, MD [facility]   To contact the attending provider between 7A-7P or the covering provider during after hours 7P-7A, please log into the web site [email or web address] and access using universal [place] password for that web site. If you do not have the password, please call the hospital operator.  12/16/2020, 2:42 PM

## 2020-12-16 NOTE — Progress Notes (Signed)
In HIDA, will follow up after.  Francisco Frank

## 2020-12-16 NOTE — Progress Notes (Signed)
0745 pt to HIDA scan

## 2020-12-17 ENCOUNTER — Inpatient Hospital Stay (HOSPITAL_COMMUNITY): Payer: Medicare Other

## 2020-12-17 DIAGNOSIS — I2510 Atherosclerotic heart disease of native coronary artery without angina pectoris: Secondary | ICD-10-CM

## 2020-12-17 DIAGNOSIS — K810 Acute cholecystitis: Secondary | ICD-10-CM | POA: Diagnosis not present

## 2020-12-17 DIAGNOSIS — C801 Malignant (primary) neoplasm, unspecified: Secondary | ICD-10-CM

## 2020-12-17 DIAGNOSIS — N179 Acute kidney failure, unspecified: Secondary | ICD-10-CM

## 2020-12-17 DIAGNOSIS — C250 Malignant neoplasm of head of pancreas: Secondary | ICD-10-CM | POA: Diagnosis not present

## 2020-12-17 DIAGNOSIS — K831 Obstruction of bile duct: Secondary | ICD-10-CM

## 2020-12-17 DIAGNOSIS — E876 Hypokalemia: Secondary | ICD-10-CM

## 2020-12-17 DIAGNOSIS — I10 Essential (primary) hypertension: Secondary | ICD-10-CM | POA: Diagnosis not present

## 2020-12-17 DIAGNOSIS — A419 Sepsis, unspecified organism: Secondary | ICD-10-CM | POA: Insufficient documentation

## 2020-12-17 HISTORY — PX: IR PERC CHOLECYSTOSTOMY: IMG2326

## 2020-12-17 LAB — CBC WITH DIFFERENTIAL/PLATELET
Abs Immature Granulocytes: 0.58 10*3/uL — ABNORMAL HIGH (ref 0.00–0.07)
Basophils Absolute: 0.1 10*3/uL (ref 0.0–0.1)
Basophils Relative: 1 %
Eosinophils Absolute: 0.8 10*3/uL — ABNORMAL HIGH (ref 0.0–0.5)
Eosinophils Relative: 5 %
HCT: 29.4 % — ABNORMAL LOW (ref 39.0–52.0)
Hemoglobin: 9.9 g/dL — ABNORMAL LOW (ref 13.0–17.0)
Immature Granulocytes: 4 %
Lymphocytes Relative: 17 %
Lymphs Abs: 2.8 10*3/uL (ref 0.7–4.0)
MCH: 30.8 pg (ref 26.0–34.0)
MCHC: 33.7 g/dL (ref 30.0–36.0)
MCV: 91.6 fL (ref 80.0–100.0)
Monocytes Absolute: 1.6 10*3/uL — ABNORMAL HIGH (ref 0.1–1.0)
Monocytes Relative: 10 %
Neutro Abs: 10.4 10*3/uL — ABNORMAL HIGH (ref 1.7–7.7)
Neutrophils Relative %: 63 %
Platelets: 319 10*3/uL (ref 150–400)
RBC: 3.21 MIL/uL — ABNORMAL LOW (ref 4.22–5.81)
RDW: 16.8 % — ABNORMAL HIGH (ref 11.5–15.5)
WBC: 16.2 10*3/uL — ABNORMAL HIGH (ref 4.0–10.5)
nRBC: 0 % (ref 0.0–0.2)

## 2020-12-17 LAB — COMPREHENSIVE METABOLIC PANEL
ALT: 28 U/L (ref 0–44)
AST: 34 U/L (ref 15–41)
Albumin: 2.1 g/dL — ABNORMAL LOW (ref 3.5–5.0)
Alkaline Phosphatase: 109 U/L (ref 38–126)
Anion gap: 7 (ref 5–15)
BUN: 10 mg/dL (ref 8–23)
CO2: 26 mmol/L (ref 22–32)
Calcium: 8 mg/dL — ABNORMAL LOW (ref 8.9–10.3)
Chloride: 104 mmol/L (ref 98–111)
Creatinine, Ser: 1.04 mg/dL (ref 0.61–1.24)
GFR, Estimated: >60 mL/min (ref >60)
Glucose, Bld: 87 mg/dL (ref 70–99)
Potassium: 3.7 mmol/L (ref 3.5–5.1)
Sodium: 137 mmol/L (ref 135–145)
Total Bilirubin: 2.3 mg/dL — ABNORMAL HIGH (ref 0.3–1.2)
Total Protein: 5.9 g/dL — ABNORMAL LOW (ref 6.5–8.1)

## 2020-12-17 LAB — GLUCOSE, CAPILLARY
Glucose-Capillary: 103 mg/dL — ABNORMAL HIGH (ref 70–99)
Glucose-Capillary: 72 mg/dL (ref 70–99)
Glucose-Capillary: 79 mg/dL (ref 70–99)
Glucose-Capillary: 88 mg/dL (ref 70–99)
Glucose-Capillary: 94 mg/dL (ref 70–99)

## 2020-12-17 LAB — CULTURE, BLOOD (ROUTINE X 2)
Culture: NO GROWTH
Culture: NO GROWTH
Special Requests: ADEQUATE

## 2020-12-17 LAB — MAGNESIUM: Magnesium: 1.7 mg/dL (ref 1.7–2.4)

## 2020-12-17 IMAGING — XA IR CHOLECYSTOSTOMY
1 series · 2 of 2 positions shown · non-contrast
Comparison: none

INDICATION: Acute calculus cholecystitis

[2d screen save: ir perc cholecystostomy · 2 of 2 slices shown]
[im 1/2]
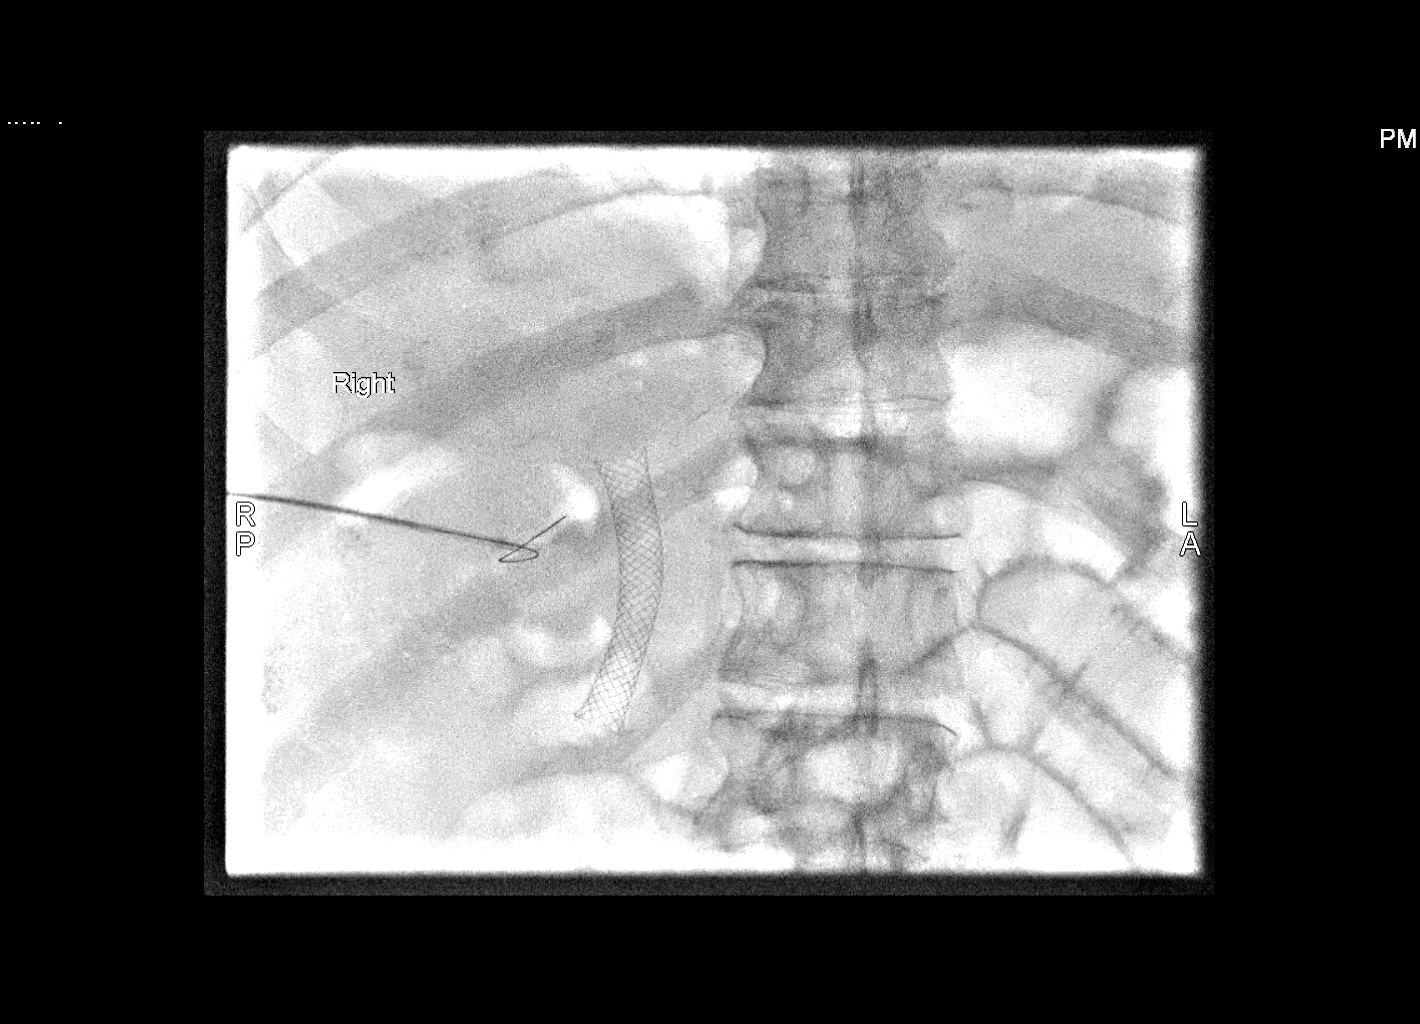
[im 2/2]
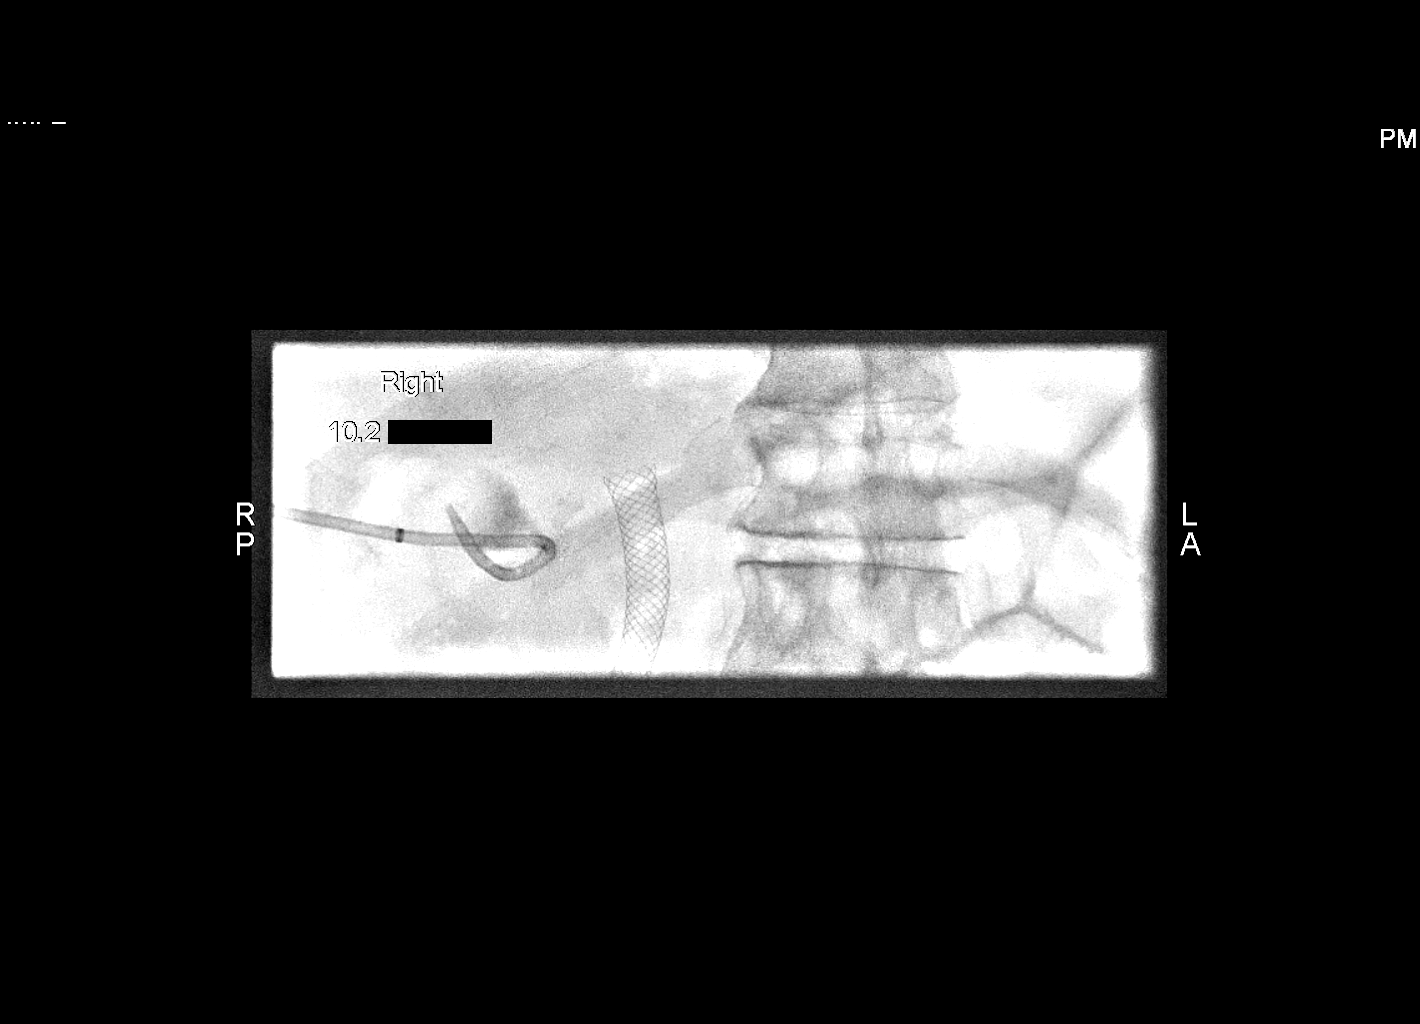

[2 of 2 positions shown; findings below may reference images not displayed]

EXAM:
ULTRASOUND FLUOROSCOPIC 10 FRENCH PERCUTANEOUS CHOLECYSTOSTOMY

Date:  [DATE] [DATE] [DATE]

Radiologist:  KALLON

Guidance:  ULTRASOUND AND FLUOROSCOPIC

FLUOROSCOPY TIME:  THREE minutes 0 seconds (28 mGy).

MEDICATIONS:
3.375 G ZOSYN, administered within 1 procedure

ANESTHESIA/SEDATION:
Moderate (conscious) sedation was employed during this procedure. A
total of Versed 2.0 mg and Fentanyl 100 mcg was administered
intravenously.

Moderate Sedation Time: 26 minutes. The patient's level of
consciousness and vital signs were monitored continuously by
radiology nursing throughout the procedure under my direct
supervision.

CONTRAST:  5mL OMNIPAQUE IOHEXOL 300 MG/ML  SOLN

COMPLICATIONS:
None immediate.

PROCEDURE:
Informed consent was obtained from the patient following explanation
of the procedure, risks, benefits and alternatives. The patient
understands, agrees and consents for the procedure. All questions
were addressed. A time out was performed.

Maximal barrier sterile technique utilized including caps, mask,
sterile gowns, sterile gloves, large sterile drape, hand hygiene,
and ChloraPrep.

Previous imaging reviewed. Distended gallbladder was localized in
the right upper quadrant through a lower intercostal space.
Overlying skin marked.

Under sterile conditions and local anesthesia, a 21 gauge access
needle was advanced transhepatically into the gallbladder. Needle
position confirmed with ultrasound. There was return of exudative
bile. Sample sent for culture. Guidewire inserted followed by tract
dilatation to insert a 10 French drain. Drain catheter position
confirmed in the gallbladder. Retention loop formed. Contrast
injection confirms position. 30 cc purulent bile removed. Catheter
secured with Prolene suture and connected to external gravity
drainage bag. Sterile dressing applied. No immediate complication.
Patient tolerated the procedure well.
IMPRESSION: Successful ultrasound and fluoroscopic 10 French percutaneous
cholecystostomy

## 2020-12-17 MED ORDER — LIDOCAINE HCL (PF) 1 % IJ SOLN
INTRAMUSCULAR | Status: DC | PRN
  Administered 2020-12-17: 10 mL

## 2020-12-17 MED ORDER — LIDOCAINE HCL (PF) 1 % IJ SOLN
INTRAMUSCULAR | Status: AC
  Filled 2020-12-17: qty 30

## 2020-12-17 MED ORDER — INSULIN GLARGINE-YFGN 100 UNIT/ML ~~LOC~~ SOLN
7.0000 [IU] | Freq: Every day | SUBCUTANEOUS | Status: DC
  Administered 2020-12-18 – 2020-12-20 (×3): 7 [IU] via SUBCUTANEOUS
  Filled 2020-12-17 (×5): qty 0.07

## 2020-12-17 MED ORDER — IOHEXOL 300 MG/ML  SOLN
15.0000 mL | Freq: Once | INTRAMUSCULAR | Status: AC | PRN
  Administered 2020-12-17: 5 mL

## 2020-12-17 MED ORDER — SODIUM CHLORIDE 0.9% FLUSH
5.0000 mL | Freq: Three times a day (TID) | INTRAVENOUS | Status: DC
  Administered 2020-12-17 – 2020-12-23 (×16): 5 mL

## 2020-12-17 MED ORDER — MIDAZOLAM HCL 2 MG/2ML IJ SOLN
INTRAMUSCULAR | Status: AC
  Filled 2020-12-17: qty 4

## 2020-12-17 MED ORDER — MIDAZOLAM HCL 2 MG/2ML IJ SOLN
INTRAMUSCULAR | Status: DC | PRN
  Administered 2020-12-17 (×2): 1 mg via INTRAVENOUS

## 2020-12-17 MED ORDER — FENTANYL CITRATE (PF) 100 MCG/2ML IJ SOLN
INTRAMUSCULAR | Status: AC
  Filled 2020-12-17: qty 2

## 2020-12-17 MED ORDER — MAGNESIUM SULFATE 2 GM/50ML IV SOLN
2.0000 g | Freq: Once | INTRAVENOUS | Status: AC
  Administered 2020-12-17: 2 g via INTRAVENOUS
  Filled 2020-12-17: qty 50

## 2020-12-17 MED ORDER — FENTANYL CITRATE (PF) 100 MCG/2ML IJ SOLN
INTRAMUSCULAR | Status: DC | PRN
  Administered 2020-12-17 (×2): 50 ug via INTRAVENOUS

## 2020-12-17 MED ORDER — PIPERACILLIN-TAZOBACTAM 3.375 G IVPB
INTRAVENOUS | Status: AC
  Administered 2020-12-17: 3.375 g via INTRAVENOUS
  Filled 2020-12-17: qty 50

## 2020-12-17 NOTE — Plan of Care (Signed)

## 2020-12-17 NOTE — Procedures (Signed)
Interventional Radiology Procedure Note  Procedure: perc cholecystostomyy    Complications: None  Estimated Blood Loss:  min  Findings: Exudative bile aspirated Cx sent 30cc removed     Tamera Punt, MD

## 2020-12-17 NOTE — Progress Notes (Signed)
   12/17/20 2217  Assess: MEWS Score  Temp 99.2 F (37.3 C)  BP 97/78  Pulse Rate 89  Resp 15  SpO2 92 %  O2 Device Room Air  Assess: MEWS Score  MEWS Temp 0  MEWS Systolic 1  MEWS Pulse 0  MEWS RR 0  MEWS LOC 1  MEWS Score 2  MEWS Score Color Yellow  Assess: if the MEWS score is Yellow or Red  Were vital signs taken at a resting state? Yes  Focused Assessment No change from prior assessment  Does the patient meet 2 or more of the SIRS criteria? No  MEWS guidelines implemented *See Row Information* No, previously yellow, continue vital signs every 4 hours  Treat  MEWS Interventions Other (Comment) (pt at previous state)  Pain Scale 0-10  Pain Score 5  Pain Location Abdomen  Pain Orientation Right  Pain Descriptors / Indicators Discomfort  Pain Intervention(s) Medication (See eMAR);Food;Relaxation  Multiple Pain Sites No  Patients response to intervention Effective  Take Vital Signs  Increase Vital Sign Frequency  Yellow: Q 2hr X 2 then Q 4hr X 2, if remains yellow, continue Q 4hrs  Escalate  MEWS: Escalate Yellow: discuss with charge nurse/RN and consider discussing with provider and RRT  Notify: Charge Nurse/RN  Name of Charge Nurse/RN Notified Renita RN  Date Charge Nurse/RN Notified 12/17/20  Time Charge Nurse/RN Notified 2322  Document  Patient Outcome Other (Comment) (stable)  Progress note created (see row info) Yes  Assess: SIRS CRITERIA  SIRS Temperature  0  SIRS Pulse 0  SIRS Respirations  0  SIRS WBC 0  SIRS Score Sum  0

## 2020-12-17 NOTE — Progress Notes (Signed)
Subjective: Now that his spasm type pain has improved, he can feel a more dull constant pain in his upper abdomen.  Ate some soup yesterday, but still not able to eat much  ROS: See above, otherwise other systems negative  Objective: Vital signs in last 24 hours: Temp:  [98.7 F (37.1 C)-100.1 F (37.8 C)] 98.7 F (37.1 C) (09/28 0501) Pulse Rate:  [77-80] 77 (09/28 0501) Resp:  [15-16] 16 (09/28 0501) BP: (130-138)/(78-99) 138/98 (09/28 0501) SpO2:  [97 %] 97 % (09/28 0501) Last BM Date: 12/14/20  Intake/Output from previous day: 09/27 0701 - 09/28 0700 In: 4607.4 [P.O.:120; I.V.:4344.8; IV Piggyback:142.6] Out: 1200 [Urine:1200] Intake/Output this shift: No intake/output data recorded.  PE: Heart: regular Lungs: CTAB Abd: soft, mildly tender in epigastrium, ND, +BS   Lab Results:  Recent Labs    12/16/20 0559  WBC 16.1*  HGB 9.8*  HCT 29.7*  PLT 269   BMET Recent Labs    12/16/20 0559  NA 139  K 4.1  CL 108  CO2 23  GLUCOSE 105*  BUN 12  CREATININE 0.91  CALCIUM 8.2*   PT/INR No results for input(s): LABPROT, INR in the last 72 hours.  CMP     Component Value Date/Time   NA 139 12/16/2020 0559   K 4.1 12/16/2020 0559   CL 108 12/16/2020 0559   CO2 23 12/16/2020 0559   GLUCOSE 105 (H) 12/16/2020 0559   BUN 12 12/16/2020 0559   CREATININE 0.91 12/16/2020 0559   CREATININE 1.07 12/10/2020 1309   CALCIUM 8.2 (L) 12/16/2020 0559   PROT 5.2 (L) 12/16/2020 0559   ALBUMIN 2.1 (L) 12/16/2020 0559   AST 34 12/16/2020 0559   AST 41 12/10/2020 1309   ALT 30 12/16/2020 0559   ALT 57 (H) 12/10/2020 1309   ALKPHOS 99 12/16/2020 0559   BILITOT 2.2 (H) 12/16/2020 0559   BILITOT 4.9 (HH) 12/10/2020 1309   GFRNONAA >60 12/16/2020 0559   GFRNONAA >60 12/10/2020 1309   Lipase     Component Value Date/Time   LIPASE 16 12/12/2020 0653       Studies/Results: NM Hepato W/EF  Result Date: 12/16/2020 CLINICAL DATA:  Left-sided abdominal pain  for several months. EXAM: NUCLEAR MEDICINE HEPATOBILIARY IMAGING TECHNIQUE: Sequential images of the abdomen were obtained out to 60 minutes following intravenous administration of radiopharmaceutical. Due to nonvisualization of gallbladder at 60 minutes, 3 mg of morphine was infused intravenously, and imaging continued for another 30 minutes. RADIOPHARMACEUTICALS:  7.7 mCi Tc-74m  Choletec IV COMPARISON:  None. FINDINGS: Prompt uptake and biliary excretion of activity by the liver is seen. Biliary activity passes into small bowel, consistent with patent common bile duct. However, no gallbladder activity is seen either before or following morphine administration, consistent with cystic duct obstruction. IMPRESSION: Absent gallbladder activity, consistent with cystic duct obstruction/acute cholecystitis. No evidence of biliary obstruction. Electronically Signed   By: Marlaine Hind M.D.   On: 12/16/2020 13:54   US Abdomen Limited  Result Date: 12/15/2020 CLINICAL DATA:  Possible cholecystitis seen on CT. Left upper quadrant epigastric abdominal pain. EXAM: ULTRASOUND ABDOMEN LIMITED RIGHT UPPER QUADRANT COMPARISON:  None. FINDINGS: Gallbladder: Air again seen within the gallbladder lumen consistent with biliary stent. 1.1 cm nonshadowing echogenic structure within the gallbladder lumen likely sludge. Polyp considered less likely given lack of internal flow. Mild gallbladder wall thickening measuring up to 4 mm. Small amount of pericholecystic fluid is present. Sonographic Percell Miller sign is negative  per technologist. Common bile duct: Diameter: 4 mm Liver: No focal hepatic lesion. Pneumobilia again seen throughout the liver consistent with CBD stent. Portal vein is patent on color Doppler imaging with normal direction of blood flow towards the liver. Other: None. IMPRESSION: 1. Mild gallbladder wall thickening and pericholecystic fluid. Given that the patient's pain is located in the left abdomen and epigastric region,  findings are unlikely to be related to acute cholecystitis. Findings could be related to chronic cholecystitis which could be better evaluated with HIDA scan. 2. 1.1 cm nonshadowing echogenic structure within the proximal gallbladder lumen is favored to be sludge given lack of significant internal flow. Follow-up ultrasound should be performed in 6 months to document stability/resolution. Electronically Signed   By: Miachel Roux M.D.   On: 12/15/2020 16:08    Anti-infectives: Anti-infectives (From admission, onward)    Start     Dose/Rate Route Frequency Ordered Stop   12/12/20 1800  metroNIDAZOLE (FLAGYL) IVPB 500 mg  Status:  Discontinued        500 mg 100 mL/hr over 60 Minutes Intravenous Every 8 hours 12/12/20 1214 12/12/20 1214   12/12/20 1400  piperacillin-tazobactam (ZOSYN) IVPB 3.375 g        3.375 g 12.5 mL/hr over 240 Minutes Intravenous Every 8 hours 12/12/20 1142     12/12/20 1300  ceFEPIme (MAXIPIME) 2 g in sodium chloride 0.9 % 100 mL IVPB  Status:  Discontinued        2 g 200 mL/hr over 30 Minutes Intravenous Every 12 hours 12/12/20 1138 12/12/20 1138   12/12/20 1000  cefTRIAXone (ROCEPHIN) 2 g in sodium chloride 0.9 % 100 mL IVPB        2 g 200 mL/hr over 30 Minutes Intravenous  Once 12/12/20 0951 12/12/20 1024   12/12/20 1000  metroNIDAZOLE (FLAGYL) IVPB 500 mg        500 mg 100 mL/hr over 60 Minutes Intravenous  Once 12/12/20 0951 12/12/20 1130        Assessment/Plan Pancreatic head cancer with obstructed CBD, s/p stenting and cholecystitis  -cont abx therapy for cholecystitis  -IR to place perc chole drain today -hyoscyamine did help with some of his episodic type pain symptoms from previously as well.  Cont this for now. -can retry diet following IR procedure. -TB down to 2.2, WBC still 16K -port placement prior to DC pending WBC and timing by Dr. Zenia Resides.  If unable will reschedule as outpatient.  FEN - NPO, IVFs VTE - Lovenox ID - zosyn  DM HTN HLD PCM -  albumin is 2.1 AKI - cr 1.28   LOS: 5 days    Francisco Frank , [facility] 12/17/2020, 9:37 AM Please see Amion for pager number during day hours 7:00am-4:30pm or 7:00am -11:30am on weekends

## 2020-12-17 NOTE — Progress Notes (Signed)
PROGRESS NOTE    Francisco Frank  ION:[id number] DOB: [birth year] DOA: 12/12/2020 PCP: Kennieth Rad, MD (Confirm with patient/family/NH records and if not entered, this HAS to be entered at [facility] point of entry. "No PCP" if truly none.)   Chief Complaint  Patient presents with   Abdominal Pain    Brief Narrative:  Francisco Frank is a 71 y.o. male with medical history significant for recently diagnosed pancreatic cancer complicated by biliary duct obstruction with abdominal pain, associated with nausea, vomiting for over a month. Imaging showed acute cholecystitis, he was started on IV antibiotics and IV Dilaudid.  Patient reports his abdominal pain has not improved despite antibiotics and IV Dilaudid.  General surgery and IR on board .  HIDA scan done , showed Absent gallbladder activity, consistent with cystic duct obstruction/acute cholecystitis. Plan for percutaneous cholecystostomy by IR.     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  Patient made n.p.o. initially general surgery consultation obtained as well as IR consultation. -Sepsis physiology improved. -Blood cultures negative x5 days. -Sepsis physiology improving. -Patient noted to have still epigastric and persistent right upper quadrant pain. -CT abdomen and pelvis done concerning for inflammation and stranding in the right upper quadrant surrounding the gallbladder. -Right upper quadrant ultrasound done with mild gallbladder wall thickening and  pericholecystic fluid, given patient's pain located left abdomen epigastric findings could be related to chronic cholecystitis better evaluated with HIDA scan.  1.1 cm nonshadowing echogenic structure within the proximal gallbladder lumen favored to be sludge given lack of significant into flow.  Follow-up ultrasound to be done in 6 months to document stability/resolution. -HIDA scan done with absent gallbladder activity, consistent with cystic duct obstruction/acute cholecystitis.  No evidence of biliary obstruction. -Patient seen by general surgery who recommended percutaneous cholecystostomy tube placement per IR. -Per general surgery high Cosamin may have helped with some of his episodic type pain symptoms. -Leukocytosis at 16.2.  Afebrile. -Continue empiric IV Zosyn. -Continue current pain regimen. -Patient for percutaneous cholecystostomy tube placement today per IR.  2.  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 -General surgery to determine Port-A-Cath placement can be done just prior to discharge during this hospitalization though may need to be rescheduled in the outpatient setting. -Oncology, Dr. Benay Spice informed of admission and per patient he was seen by Dr. Benay Spice early this morning. -Continue to hold Creon while NPO.  3.  Insulin-dependent diabetes mellitus II -Hemoglobin A1c 9.6  (11/09/2020). -CBG 79 this morning however patient currently NPO. -Continue Lantus 7 units nightly, SSI. -Outpatient follow-up with PCP.  4.  Hypomagnesemia/hypokalemia -Magnesium at 1.7, will give magnesium sulfate 2 g IV x1. -Potassium at 3.7. -Repeat labs in the morning.  5.  Coronary artery artery disease -Stable. -Was on Plavix and aspirin at home prior to admission. -Continue statin. -Continue to hold Plavix due to concerns patient may  need surgery. -General surgery/IR to advise when Plavix  may be resumed.  6.  Anemia of chronic disease -Hemoglobin stable at 9.9. -Transfusion threshold hemoglobin < 7.  7.  AKI with mild metabolic acidosis -Likely secondary to problem #1 and prerenal azotemia. -Resolved with hydration.  8.  Hypertension -Blood pressure currently stable. -Continue to hold Norvasc, atenolol   DVT prophylaxis: Lovenox Code Status: Full Family Communication: Updated patient and wife at bedside. Disposition:   Status is: Inpatient  Remains inpatient appropriate because:Inpatient level of care appropriate due to severity of illness  Dispo: The patient is from: Home              Anticipated d/c is to: Home              Patient currently is not medically stable to d/c.   Difficult to place patient No       Consultants:  IR: Dr. Shelton Silvas 12/17/2020 General surgery Oncology  Procedures:  CT abdomen and pelvis 12/12/2020 Chest x-ray 12/12/2020 HIDA scan 12/16/2020 Percutaneous cholecystostomy tube placement pending per IR 12/17/2020 Abdominal ultrasound 12/15/2020 IR cholangiogram 12/11/2020 per Dr. Laurence Ferrari    Antimicrobials:  IV Rocephin 9/23/ 2022x1 dose IV Flagyl 12/12/2020 x 1 dose IV Zosyn 12/12/2020>>>>>    Subjective: Patient laying in bed.  Denies any chest pain.  No shortness of breath.  States some improvement with abdominal pain.  Awaiting for percutaneous cholecystostomy tube to be placed on hold pending will be done today as he has been n.p.o. since yesterday.  Objective: Vitals:   12/16/20 0524 12/16/20 1745 12/16/20 2114 12/17/20 0501  BP: (!) 132/91 132/78 (!) 130/99 (!) 138/98  Pulse: 81 80 80 77  Resp: _0 Temp: 98.1 F (36.7 C) 99.3 F (37.4 C) 100.1 F (37.8 C) 98.7 F (37.1 C)  TempSrc: Oral Oral Oral Oral  SpO2: 94% 97% 97% 97%  Weight:      Height:        Intake/Output Summary (Last 24 hours) at 12/17/2020 1339 Last data filed at 12/17/2020  1217 Gross per 24 hour  Intake 5278.69 ml  Output 1400 ml  Net 3878.69 ml   Filed Weights   12/12/20 2000  Weight: 68.2 kg    Examination:  General exam: Appears calm and comfortable  Respiratory system: Clear to auscultation. Respiratory effort normal. Cardiovascular system: S1 & S2 heard, RRR. No JVD, murmurs, rubs, gallops or clicks. No pedal edema. Gastrointestinal system: Some epigastric tenderness to palpation.  Positive bowel sounds.  Nondistended.  Soft.  No rebound.  No guarding.  Central nervous system: Alert and oriented. No focal neurological deficits. Extremities: Symmetric 5 x 5 power. Skin: No rashes, lesions or ulcers Psychiatry: Judgement and insight appear normal. Mood & affect appropriate.     Data Reviewed: I have personally reviewed following labs and imaging studies  CBC: Recent Labs  Lab 12/12/20 0653 12/13/20 0546 12/14/20 0645 12/16/20 0559 12/17/20 1013  WBC 16.0* 15.8* 16.3* 16.1* 16.2*  NEUTROABS 13.4*  --   --  9.7* 10.4*  HGB 10.1* 9.3* 9.4* 9.8* 9.9*  HCT 30.8* 27.7* 27.7* 29.7* 29.4*  MCV 92.8 92.3 92.3 93.1 91.6  PLT 308 263 253 269 716    Basic Metabolic Panel: Recent Labs  Lab 12/12/20 0653 12/13/20 0546 12/14/20 0645 12/16/20 0559 12/17/20 1013  NA 135 136 143 139 137  K 3.1* 3.2* 3.8 4.1 3.7  CL 99 108 114* 108 104  CO2 20* 22 19* 23 26  GLUCOSE 259* 135* 113* 105* 87  BUN  31* _0 CREATININE 1.44* 1.16 1.28* 0.91 1.04  CALCIUM 8.9 7.9* 8.1* 8.2* 8.0*  MG 1.8  --   --   --  1.7    GFR: Estimated Creatinine Clearance: 62.8 mL/min (by C-G formula based on SCr of 1.04 mg/dL).  Liver Function Tests: Recent Labs  Lab 12/12/20 0653 12/13/20 0546 12/14/20 0645 12/16/20 0559 12/17/20 1013  AST 35 33 37 34 34  ALT 46* 36 35 30 28  ALKPHOS 122 108 98 99 109  BILITOT 4.0* 2.8* 2.7* 2.2* 2.3*  PROT 6.9 5.6* 5.5* 5.2* 5.9*  ALBUMIN 2.6* 2.2* 2.1* 2.1* 2.1*    CBG: Recent Labs  Lab 12/16/20 1241  12/16/20 1743 12/16/20 2116 12/17/20 0743 12/17/20 1155  GLUCAP 113* 157* 140* 79 103*     Recent Results (from the past 240 hour(s))  Blood culture (routine x 2)     Status: None   Collection Time: 12/12/20  6:55 AM   Specimen: BLOOD RIGHT HAND  Result Value Ref Range Status   Specimen Description   Final    BLOOD RIGHT HAND Performed at [facility], [place] [address], [place], [place] [address]    Special Requests   Final    BOTTLES DRAWN AEROBIC AND ANAEROBIC Blood Culture adequate volume Performed at [facility] [address], [place], [place] [address]    Culture   Final    NO GROWTH 5 DAYS Performed at [facility], [place] [address], [place], [place] [address]    Report Status 12/17/2020 FINAL  Final  Blood culture (routine x 2)     Status: None   Collection Time: 12/12/20  7:44 AM   Specimen: BLOOD  Result Value Ref Range Status   Specimen Description   Final    BLOOD LEFT ANTECUBITAL Performed at [facility] [address], [place], [place] [address]    Special Requests   Final    BOTTLES DRAWN AEROBIC AND ANAEROBIC Blood Culture results may not be optimal due to an excessive volume of blood received in culture bottles Performed at [facility] [address] [place], [place] [address]    Culture   Final    NO GROWTH 5 DAYS Performed at [facility], [place] [address] [place], [place] [address]    Report Status 12/17/2020 FINAL  Final  Resp Panel by RT-PCR (Flu A&B, Covid) Nasopharyngeal Swab     Status: None   Collection Time: 12/12/20 10:24 AM   Specimen: Nasopharyngeal Swab; Nasopharyngeal(NP) swabs in vial transport medium  Result Value Ref Range Status   SARS Coronavirus 2 by RT PCR NEGATIVE NEGATIVE Final    Comment: (NOTE) SARS-CoV-2 target nucleic acids are NOT DETECTED.  The SARS-CoV-2 RNA is generally detectable in upper respiratory specimens during the acute  phase of infection. The lowest concentration of SARS-CoV-2 viral copies this assay can detect is 138 copies/mL. A negative result does not preclude SARS-Cov-2 infection and should not be used as the sole basis for treatment or other patient management decisions. A negative result may occur with  improper specimen collection/handling, submission of specimen other than nasopharyngeal swab, presence of viral mutation(s) within the areas targeted by this assay, and inadequate number of viral copies(<138 copies/mL). A negative result must be combined with clinical observations, patient history, and epidemiological information. The expected result is Negative.  Fact Sheet for Patients:  EntrepreneurPulse.com.au  Fact Sheet for Healthcare Providers:  IncredibleEmployment.be  This test is no t yet approved or cleared by the [place] and  has been authorized for detection and/or diagnosis of SARS-CoV-2 by FDA under an Emergency Use Authorization (EUA). This EUA will remain  in effect (meaning this test can be used) for the duration of the COVID-19 declaration under Section 564(b)(1) of the Act, 21 U.S.C.section 360bbb-3(b)(1), unless the authorization is terminated  or revoked sooner.       Influenza A by PCR NEGATIVE NEGATIVE Final   Influenza B by PCR NEGATIVE NEGATIVE Final    Comment: (NOTE) The Xpert Xpress SARS-CoV-2/FLU/RSV plus assay is intended as an aid in the diagnosis of influenza from Nasopharyngeal swab specimens and should not be used as a sole basis for treatment. Nasal washings and aspirates are unacceptable for Xpert Xpress SARS-CoV-2/FLU/RSV testing.  Fact Sheet for Patients: EntrepreneurPulse.com.au  Fact Sheet for Healthcare Providers: IncredibleEmployment.be  This test is not yet approved or cleared by the [place] FDA and has been authorized for detection and/or diagnosis of  SARS-CoV-2 by FDA under an Emergency Use Authorization (EUA). This EUA will remain in effect (meaning this test can be used) for the duration of the COVID-19 declaration under Section 564(b)(1) of the Act, 21 U.S.C. section 360bbb-3(b)(1), unless the authorization is terminated or revoked.  Performed at [facility], [place] [address], [place], [place] [address]          Radiology Studies: NM Hepato W/EF  Result Date: 12/16/2020 CLINICAL DATA:  Left-sided abdominal pain for several months. EXAM: NUCLEAR MEDICINE HEPATOBILIARY IMAGING TECHNIQUE: Sequential images of the abdomen were obtained out to 60 minutes following intravenous administration of radiopharmaceutical. Due to nonvisualization of gallbladder at 60 minutes, 3 mg of morphine was infused intravenously, and imaging continued for another 30 minutes. RADIOPHARMACEUTICALS:  7.7 mCi Tc-83m Choletec IV COMPARISON:  None. FINDINGS: Prompt uptake and biliary excretion of activity by the liver is seen. Biliary activity passes into small bowel, consistent with patent common bile duct. However, no gallbladder activity is seen either before or following morphine administration, consistent with cystic duct obstruction. IMPRESSION: Absent gallbladder activity, consistent with cystic duct obstruction/acute cholecystitis. No evidence of biliary obstruction. Electronically Signed   By: JMarlaine HindM.D.   On: 12/16/2020 13:54        Scheduled Meds:  acetaminophen  1,000 mg Oral Once   atorvastatin  40 mg Oral Daily   celecoxib  200 mg Oral Once   enoxaparin (LOVENOX) injection  40 mg Subcutaneous Q24H   feeding supplement  237 mL Oral TID BM   hyoscyamine  0.375 mg Oral Q12H   insulin aspart  0-15 Units Subcutaneous TID WC   insulin glargine  7 Units Subcutaneous QHS   Continuous Infusions:  0.9 % NaCl with KCl 20 mEq / L 75 mL/hr at 12/17/20 1217   magnesium sulfate bolus IVPB     piperacillin-tazobactam (ZOSYN)  IV  Stopped (12/17/20 0915)     LOS: 5 days    Time spent: 40 minutes    DIrine Seal MD [facility]   To contact the attending provider between 7A-7P or the covering provider during after hours 7P-7A, please log into the web site [email or web address] and access using universal [place] password for that web site. If you do not have the password, please call the hospital operator.  12/17/2020, 1:39 PM

## 2020-12-17 NOTE — Consult Note (Signed)
Chief Complaint: Patient was seen in consultation today for  Chief Complaint  Patient presents with   Abdominal Pain   at the request of Saverio Danker, [place]  Supervising Physician: Daryll Brod  Patient Status: [facility] - In-pt  History of Present Illness: Francisco Frank is a 70 y.o. male with medical history significant for recently diagnosed pancreatic cancer complicated by biliary duct obstruction who represents to ER on 9/23 with abdominal pain and leukocytosis, one after discharge and biliary drain removal.  He currently has biliary stent in place.  Without significant improvement with medical management IR has been consulted for percutaneous cholecystostomy. Pt reports NPO status and willingness to proceed with percutaneous cholecystostomy.    Past Medical History:  Diagnosis Date   Back pain    Diabetes mellitus without complication ([place])    Hearing deficit    Hyperlipemia    Hypertension    Insomnia    Sleep apnea     Past Surgical History:  Procedure Laterality Date   BIOPSY  11/11/2020   Procedure: BIOPSY;  Surgeon: Rush Landmark Telford Nab., MD;  Location: [facility];  Service: Gastroenterology;;   CARDIAC CATHETERIZATION     ENDOSCOPIC RETROGRADE CHOLANGIOPANCREATOGRAPHY (ERCP) WITH PROPOFOL N/A 11/11/2020   Procedure: ENDOSCOPIC RETROGRADE CHOLANGIOPANCREATOGRAPHY (ERCP) WITH PROPOFOL;  Surgeon: Irving Copas., MD;  Location: [facility];  Service: Gastroenterology;  Laterality: N/A;   ESOPHAGOGASTRODUODENOSCOPY (EGD) WITH PROPOFOL N/A 11/11/2020   Procedure: ESOPHAGOGASTRODUODENOSCOPY (EGD) WITH PROPOFOL;  Surgeon: Rush Landmark Telford Nab., MD;  Location: [facility];  Service: Gastroenterology;  Laterality: N/A;   EUS N/A 11/11/2020   Procedure: UPPER ENDOSCOPIC ULTRASOUND (EUS) RADIAL;  Surgeon: Irving Copas., MD;  Location: [facility];  Service: Gastroenterology;  Laterality: N/A;   FINE NEEDLE ASPIRATION  11/11/2020   Procedure: FINE  NEEDLE ASPIRATION;  Surgeon: Rush Landmark Telford Nab., MD;  Location: [facility];  Service: Gastroenterology;;   HEMOSTASIS CONTROL  11/11/2020   Procedure: HEMOSTASIS CONTROL;  Surgeon: Irving Copas., MD;  Location: [facility];  Service: Gastroenterology;;  epi injection   IR BILIARY STENT(S) EXISTING ACCESS INC DILATION CATH EXCHANGE  11/19/2020   IR CHOLANGIOGRAM EXISTING TUBE  12/11/2020   IR CONVERT BILIARY DRAIN TO INT EXT BILIARY DRAIN  11/28/2020   IR PERCUTANEOUS TRANSHEPATIC CHOLANGIOGRAM  11/12/2020   REPLACEMENT TOTAL KNEE     ROTATOR CUFF REPAIR     SPHINCTEROTOMY  11/11/2020   Procedure: SPHINCTEROTOMY;  Surgeon: Irving Copas., MD;  Location: [facility];  Service: Gastroenterology;;    Allergies: Norco [hydrocodone-acetaminophen]  Medications: Prior to Admission medications   Medication Sig Start Date End Date Taking? Authorizing Provider  amLODipine (NORVASC) 5 MG tablet Take 5 mg by mouth daily. 10/22/20  Yes [provider]  atenolol (TENORMIN) 50 MG tablet Take 50 mg by mouth daily.   Yes [provider]  atorvastatin (LIPITOR) 40 MG tablet Take 40 mg by mouth daily. 11/16/20  Yes [provider]  clopidogrel (PLAVIX) 75 MG tablet Take 1 tablet (75 mg total) by mouth at bedtime. 11/14/20  Yes Kc, Maren Beach, MD  diphenhydramine-acetaminophen (TYLENOL PM) 25-500 MG TABS tablet Take 1 tablet by mouth at bedtime as needed. Patient taking differently: Take 1 tablet by mouth at bedtime as needed (sleep). 11/26/20  Yes Ladell Pier, MD  Insulin Glargine (BASAGLAR KWIKPEN) 100 UNIT/ML Inject 9 Units into the skin at bedtime. 09/27/20  Yes [provider]  NOVOLOG FLEXPEN 100 UNIT/ML FlexPen Inject 2-10 Units into the skin 3 (three) times  daily as needed for high blood sugar. If BS is 150-200=2 units, 201-250=4 units, 251-300=6 units, 301-350=8 units, 351-400=10 units. 09/18/20  Yes [provider]  traMADol (ULTRAM) 50 MG  tablet Take 50 mg by mouth every 4 (four) hours as needed for moderate pain or severe pain. 06/11/20  Yes [provider]  BD PEN NEEDLE NANO 2ND GEN 32G X 4 MM MISC  11/17/20   [provider]  cholestyramine (QUESTRAN) 4 g packet Take 1 packet (4 g total) by mouth 2 (two) times daily before a meal for 5 days. Patient not taking: No sig reported 11/29/20 01/24/21  Dwan Bolt, MD  lipase/protease/amylase (CREON) 12000-38000 units CPEP capsule Take 1 capsule by mouth with breakfast, with lunch, and with evening meal. 12/02/20 12/02/21  [provider]     Family History  Problem Relation Age of Onset   Leukemia Mother        dx 42s   Cirrhosis Father    Alcohol abuse Father    Colon polyps Maternal Grandmother        unknown #   Heart disease Paternal Grandfather    Prostate cancer Paternal Grandfather        dx after 51   Esophageal cancer Neg Hx    Pancreatic cancer Neg Hx    Stomach cancer Neg Hx     Social History   Socioeconomic History   Marital status: Married    Spouse name: Not on file   Number of children: Not on file   Years of education: Not on file   Highest education level: Not on file  Occupational History   Not on file  Tobacco Use   Smoking status: Never   Smokeless tobacco: Current    Types: Snuff    Last attempt to quit: 2018  Vaping Use   Vaping Use: Never used  Substance and Sexual Activity   Alcohol use: Yes    Comment: rare   Drug use: Never   Sexual activity: Not Currently  Other Topics Concern   Not on file  Social History Narrative   Not on file   Social Determinants of Health   Financial Resource Strain: Not on file  Food Insecurity: Not on file  Transportation Needs: Not on file  Physical Activity: Not on file  Stress: Not on file  Social Connections: Not on file    Review of Systems: A 12 point ROS discussed and pertinent positives are indicated in the HPI above.  All other systems are  negative.  Review of Systems  Constitutional:  Positive for activity change and appetite change.  Respiratory: Negative.    Cardiovascular: Negative.   Gastrointestinal:  Positive for abdominal pain and nausea.  Skin:  Positive for color change.   Vital Signs: BP (!) 138/98 (BP Location: Right Arm)   Pulse 77   Temp 98.7 F (37.1 C) (Oral)   Resp 16   Ht 5\' 8"  (1.727 m)   Wt 150 lb 5.7 oz (68.2 kg)   SpO2 97%   BMI 22.86 kg/m   Physical Exam Constitutional:      General: He is not in acute distress.    Appearance: He is ill-appearing. He is not toxic-appearing.  HENT:     Head: Normocephalic and atraumatic.     Mouth/Throat:     Pharynx: Oropharynx is clear.  Cardiovascular:     Rate and Rhythm: Normal rate.  Pulmonary:     Effort: Pulmonary effort is normal.  Abdominal:     General: Abdomen is flat. Bowel sounds are decreased.     Palpations: Abdomen is soft.     Tenderness: There is abdominal tenderness in the right upper quadrant and left upper quadrant.  Skin:    General: Skin is dry.  Neurological:     General: No focal deficit present.     Mental Status: He is alert and oriented to person, place, and time.  Psychiatric:        Mood and Affect: Mood normal.        Behavior: Behavior normal.    Imaging: CT ABDOMEN PELVIS W WO CONTRAST  Result Date: 11/28/2020 CLINICAL DATA:  Staging pancreas mass. Evaluate for vascular invasion. EXAM: CT ABDOMEN AND PELVIS WITHOUT AND WITH CONTRAST TECHNIQUE: Multidetector CT imaging of the abdomen and pelvis was performed following the standard protocol before and following the bolus administration of intravenous contrast. CONTRAST:  50mL OMNIPAQUE IOHEXOL 350 MG/ML SOLN COMPARISON:  MRI 11/06/2020. FINDINGS: Lower chest: No acute abnormality. Hepatobiliary: No focal liver lesion identified. Pneumobilia is identified status post percutaneous transhepatic biliary stent placement and internal bile duct stent placement. Interval  decompression of previous dilated intrahepatic and common bile ducts. Pancreas: Again noted is atrophy of the body through tail of pancreas with diffuse main duct dilatation. Mass within head of pancreas is somewhat ill-defined measuring approximately 3.5 x 2.8 cm, image 60/3. There is no involvement scratch set scratch set No signs of encasement of the celiac trunk or superior mesenteric artery. There is partial encasement of the portal venous confluence (at least 180 degrees), image 32/4. There is also partial encasement (approximately 180 degrees) of the superior mesenteric vein just distal to the confluence, image 36/4. Ventral extension of tumor abuts the posterior wall of the proximal duodenum, image 32/4. Cannot exclude invasion. Spleen: Normal in size without focal abnormality. Adrenals/Urinary Tract: Normal adrenal glands. The kidneys are unremarkable. No mass or hydronephrosis. Urinary bladder unremarkable. Stomach/Bowel: Stomach is nondilated. No bowel wall thickening, inflammation, or distension. Vascular/Lymphatic: Aortic atherosclerosis without aneurysm. No abdominal adenopathy. No pelvic adenopathy. Reproductive: Prostate is unremarkable. Other: No abdominal wall hernia or abnormality. No abdominopelvic ascites. Musculoskeletal: No acute or significant osseous findings. IMPRESSION: 1. Mass within head of pancreas is somewhat ill-defined measuring approximately 3.5 x 2.8 cm. There is partial encasement of the portal venous confluence and superior mesenteric vein. No signs of encasement of the celiac trunk or superior mesenteric artery. 2. No findings of nodal metastasis or solid organ metastasis within the abdomen or pelvis. 3. Status post percutaneous transhepatic biliary stent placement and internal bile duct stent placement with decompression of previous dilated intrahepatic and common bile ducts. 4. Aortic atherosclerosis. Aortic Atherosclerosis (ICD10-I70.0). Electronically Signed   By: Kerby Moors M.D.   On: 11/28/2020 16:24   CT ABDOMEN PELVIS W CONTRAST  Addendum Date: 12/12/2020   ADDENDUM REPORT: 12/12/2020 09:21 ADDENDUM: Study discussed by telephone with PA Deno Etienne on 12/12/2020 at 09:19. We discussed conservative treatment including antibiotics and hydration. But the Interventional Radiology team at [facility] is being updated on Mr. Crilly situation. Electronically Signed   By: Genevie Ann M.D.   On: 12/12/2020 09:21   Result Date: 12/12/2020 CLINICAL DATA:  70 year old male status post removal of transhepatic internal/external biliary drainage catheter yesterday. Underlying malignant obstructed jaundice secondary to pancreatic cancer. Indwelling metal biliary stent. EXAM: CT ABDOMEN AND PELVIS WITH CONTRAST TECHNIQUE: Multidetector CT imaging of the abdomen and pelvis was performed using the standard protocol  following bolus administration of intravenous contrast. CONTRAST:  77mL OMNIPAQUE IOHEXOL 350 MG/ML SOLN COMPARISON:  CT Abdomen and Pelvis 11/28/2020 and earlier. FINDINGS: Lower chest: Minor atelectasis, greater on the left. Otherwise negative. Hepatobiliary: Mildly dilated and inflamed gallbladder with confluent regional soft tissue stranding extending to the porta hepatis (series 2, image 28). Gallbladder wall thickening. But there is also gas within the gallbladder lumen, similar to diffuse pneumobilia elsewhere. And the metallic CBD stent also contains gas and appears to be patent terminating in the duodenum. Subtle former drainage tract through the liver is identified with no adverse features. No perihepatic free fluid. Liver enhancement maintained. Pancreas: Spiculated pancreatic head mass with upstream ductal dilatation and pancreatic atrophy. Spleen: Negative. Adrenals/Urinary Tract: Negative. Symmetric renal enhancement and early contrast excretion. Stomach/Bowel: No dilated large or small bowel. Secondary inflammation of the hepatic flexure appears related to the  gallbladder on coronal image 49. Appendix is normal on series 2, image 64. Decompressed and negative terminal ileum. No free air. The distal stomach and duodenum also appear actively inflamed at the porta hepatis. The distal duodenum appear spared. There is only mild gas and fluid distention of the stomach. Vascular/Lymphatic: Aortoiliac calcified atherosclerosis. Major arterial structures remain patent. The portal venous system remains patent. Small but numerous peripancreatic lymph nodes appear stable from earlier this month. Reproductive: Negative. Other: No pelvic free fluid. Musculoskeletal: Stable. No acute or suspicious osseous lesion identified. IMPRESSION: 1. Positive for Acute Cholecystitis, with confluent regional inflammation secondarily involving the gastric antrum, duodenum, and hepatic flexure of colon. But the metallic CBD stent appears patent with no adverse features. And there is pneumobilia within the gallbladder lumen. 2. Transhepatic biliary drain removed with no adverse features along the former drainage tract. 3. Known Pancreatic Tumor. Electronically Signed: By: Genevie Ann M.D. On: 12/12/2020 09:07   NM Hepato W/EF  Result Date: 12/16/2020 CLINICAL DATA:  Left-sided abdominal pain for several months. EXAM: NUCLEAR MEDICINE HEPATOBILIARY IMAGING TECHNIQUE: Sequential images of the abdomen were obtained out to 60 minutes following intravenous administration of radiopharmaceutical. Due to nonvisualization of gallbladder at 60 minutes, 3 mg of morphine was infused intravenously, and imaging continued for another 30 minutes. RADIOPHARMACEUTICALS:  7.7 mCi Tc-36m  Choletec IV COMPARISON:  None. FINDINGS: Prompt uptake and biliary excretion of activity by the liver is seen. Biliary activity passes into small bowel, consistent with patent common bile duct. However, no gallbladder activity is seen either before or following morphine administration, consistent with cystic duct obstruction. IMPRESSION:  Absent gallbladder activity, consistent with cystic duct obstruction/acute cholecystitis. No evidence of biliary obstruction. Electronically Signed   By: Marlaine Hind M.D.   On: 12/16/2020 13:54   US Abdomen Limited  Result Date: 12/15/2020 CLINICAL DATA:  Possible cholecystitis seen on CT. Left upper quadrant epigastric abdominal pain. EXAM: ULTRASOUND ABDOMEN LIMITED RIGHT UPPER QUADRANT COMPARISON:  None. FINDINGS: Gallbladder: Air again seen within the gallbladder lumen consistent with biliary stent. 1.1 cm nonshadowing echogenic structure within the gallbladder lumen likely sludge. Polyp considered less likely given lack of internal flow. Mild gallbladder wall thickening measuring up to 4 mm. Small amount of pericholecystic fluid is present. Sonographic Percell Miller sign is negative per technologist. Common bile duct: Diameter: 4 mm Liver: No focal hepatic lesion. Pneumobilia again seen throughout the liver consistent with CBD stent. Portal vein is patent on color Doppler imaging with normal direction of blood flow towards the liver. Other: None. IMPRESSION: 1. Mild gallbladder wall thickening and pericholecystic fluid. Given that the patient's pain  is located in the left abdomen and epigastric region, findings are unlikely to be related to acute cholecystitis. Findings could be related to chronic cholecystitis which could be better evaluated with HIDA scan. 2. 1.1 cm nonshadowing echogenic structure within the proximal gallbladder lumen is favored to be sludge given lack of significant internal flow. Follow-up ultrasound should be performed in 6 months to document stability/resolution. Electronically Signed   By: Miachel Roux M.D.   On: 12/15/2020 16:08   DG Chest Port 1 View  Result Date: 12/12/2020 CLINICAL DATA:  70 year old male with abnormal pulmonary auscultation in the right lower lung. Abdominal pain following removal of internal/external biliary drain yesterday. EXAM: PORTABLE CHEST 1 VIEW  COMPARISON:  Chest CTA 08/08/2020. FINDINGS: Portable AP semi upright view at 0832 hours. Lower lung volumes. Stable mild elevation of the right hemidiaphragm. Normal cardiac size and mediastinal contours. Visualized tracheal air column is within normal limits. Allowing for portable technique the lungs are clear. No pneumothorax. Negative visible bowel gas pattern. No acute osseous abnormality identified. IMPRESSION: Lower lung volumes.  No acute cardiopulmonary abnormality. Electronically Signed   By: Genevie Ann M.D.   On: 12/12/2020 08:56   IR CHOLANGIOGRAM EXISTING TUBE  Result Date: 12/11/2020 INDICATION: 70 year old male with malignant obstructed jaundice secondary to pancreatic cancer. He has an internal metallic stent as well as an internal/external biliary drainage catheter which has been capped. He is tolerating the capped catheter well. His bilirubin continues to trend down and is the lowest it has been since the time of stent placement. He finds the internal/external drainage catheter very painful. He presents today for transhepatic cholangiogram and possible drain removal. EXAM: CHOLANGIOGRAM VIA EXISTING CATHETER MEDICATIONS: None ANESTHESIA/SEDATION: None FLUOROSCOPY TIME:  Fluoroscopy Time: 0 minutes 36 seconds (4 mGy). COMPLICATIONS: None immediate. PROCEDURE: Informed written consent was obtained from the patient after a thorough discussion of the procedural risks, benefits and alternatives. All questions were addressed. Maximal Sterile Barrier Technique was utilized including caps, mask, sterile gowns, sterile gloves, sterile drape, hand hygiene and skin antiseptic. A timeout was performed prior to the initiation of the procedure. The existing catheter was freed from the retention suture, transected and removed over a Bentson wire. A 9 French vascular sheath was advanced over the wire and positioned in the right hepatic duct. A transhepatic cholangiogram was then performed. Minimal biliary ductal  dilatation. Contrast material passes freely through the widely patent stent and into the duodenum. There may be minimal sludge present. The decision was made to proceed with drain removal. The wire and sheath were removed. A bandage was placed over the access site. IMPRESSION: Widely patent internal self expanding covered biliary stent. Given toleration of capped trial and decreasing bilirubin, the decision was made to remove the internal/external biliary drainage catheter. Should patient developed recurrent symptoms of obstruction, the stent should be approachable endoscopically in the future. Electronically Signed   By: Jacqulynn Cadet M.D.   On: 12/11/2020 15:39   IR BILIARY STENT(S) EXISTING ACCESS INC DILATION CATH EXCHANGE  Result Date: 11/19/2020 INDICATION: 70 year old male with malignant obstructed jaundice secondary to pancreatic cancer. Percutaneous internal/external biliary drainage catheter was placed on 11/12/2020 after an unsuccessful attempted ERCP on 11/11/2020. Patient remains jaundiced and presents today for transhepatic biliary stent placement. EXAM: 1. Biliary stent placement through existing access 2. Biliary drain exchange MEDICATIONS: 2 g Rocephin; The antibiotic was administered within an appropriate time frame prior to the initiation of the procedure. ANESTHESIA/SEDATION: Moderate (conscious) sedation was employed during this procedure.  A total of Versed 2 mg and Fentanyl 100 mcg was administered intravenously. Moderate Sedation Time: 15 minutes. The patient's level of consciousness and vital signs were monitored continuously by radiology nursing throughout the procedure under my direct supervision. FLUOROSCOPY TIME:  Fluoroscopy Time: 3 minutes 18 seconds (42 mGy). COMPLICATIONS: None immediate. PROCEDURE: Informed written consent was obtained from the patient after a thorough discussion of the procedural risks, benefits and alternatives. All questions were addressed. Maximal Sterile  Barrier Technique was utilized including caps, mask, sterile gowns, sterile gloves, sterile drape, hand hygiene and skin antiseptic. A timeout was performed prior to the initiation of the procedure. Local anesthesia was attained by infiltration with 1% lidocaine. The retention suture on the existing internal/external biliary drainage catheter was cut. The drainage catheter was transected and removed over a Bentson wire. A 5 French angled catheter was advanced over the wire and into the duodenum. The catheter and wire combination were advanced into the proximal jejunum. The Bentson wire was exchanged for an Amplatz wire. The 5 French catheter was exchanged for a 9 [place] sheath which was advanced into the duodenum. A pull-back cholangiogram was then performed confirming persistent occlusion of the mid and distal common bile duct. The occlusion length measures approximately 5.8 cm. Therefore, a 10 mm x 6 cm wall flex covered biliary stent was selected. The sheath was readvanced over the wire into the duodenum. The stent was positioned across the stenosis before being unsheathed. The stent was then successfully deployed. The Amplatz wire was brought back and capped in the common hepatic duct. A new 10.2 French Dawson Mueller drainage catheter was advanced over the wire and formed in the common hepatic duct. The catheter was secured to the skin with 0 Prolene suture. Sterile bandages were applied. IMPRESSION: 1. Successful placement of a self expanding covered biliary stent measuring 10 x 60 mm. 2. Biliary drain exchange with a 10.2 French Dawson Mueller catheter left in the common hepatic duct above the biliary stent. The tube was left capped to initiate a physiologic trial. PLAN: Return Interventional Radiology in 2 weeks for serum bilirubin evaluation, cholangiogram and possible drain removal. Electronically Signed   By: Jacqulynn Cadet M.D.   On: 11/19/2020 13:14    Labs:  CBC: Recent Labs    12/13/20 0546  12/14/20 0645 12/16/20 0559 12/17/20 1013  WBC 15.8* 16.3* 16.1* 16.2*  HGB 9.3* 9.4* 9.8* 9.9*  HCT 27.7* 27.7* 29.7* 29.4*  PLT 263 253 269 319    COAGS: Recent Labs    11/10/20 1019 11/19/20 0850 11/28/20 1005 12/13/20 0546  INR 0.9 0.9 0.9 1.1    BMP: Recent Labs    12/13/20 0546 12/14/20 0645 12/16/20 0559 12/17/20 1013  NA 136 143 139 137  K 3.2* 3.8 4.1 3.7  CL 108 114* 108 104  CO2 22 19* 23 26  GLUCOSE 135* 113* 105* 87  BUN 22 17 12 10   CALCIUM 7.9* 8.1* 8.2* 8.0*  CREATININE 1.16 1.28* 0.91 1.04  GFRNONAA >60 60* >60 >60    LIVER FUNCTION TESTS: Recent Labs    12/13/20 0546 12/14/20 0645 12/16/20 0559 12/17/20 1013  BILITOT 2.8* 2.7* 2.2* 2.3*  AST 33 37 34 34  ALT 36 35 30 28  ALKPHOS 108 98 99 109  PROT 5.6* 5.5* 5.2* 5.9*  ALBUMIN 2.2* 2.1* 2.1* 2.1*    Assessment and Plan:  Acute Cholecystitis Biliary Obstruction --OK to proceed with planned percutaneous cholecystostomy.  Patient is NPO.  Consent in IR  Pancreatic Cancer  Thank you for this interesting consult.  I greatly enjoyed meeting Francisco Frank and look forward to participating in their care.  A copy of this report was sent to the requesting provider on this date.  Electronically Signed: Pasty Spillers, PA 12/17/2020, 11:27 AM   I spent a total of 20 Minutes in face to face in clinical consultation, greater than 50% of which was counseling/coordinating care for percutaneous cholecystostomy

## 2020-12-18 DIAGNOSIS — C250 Malignant neoplasm of head of pancreas: Secondary | ICD-10-CM | POA: Diagnosis not present

## 2020-12-18 DIAGNOSIS — I2510 Atherosclerotic heart disease of native coronary artery without angina pectoris: Secondary | ICD-10-CM | POA: Diagnosis not present

## 2020-12-18 DIAGNOSIS — K810 Acute cholecystitis: Secondary | ICD-10-CM | POA: Diagnosis not present

## 2020-12-18 DIAGNOSIS — I10 Essential (primary) hypertension: Secondary | ICD-10-CM | POA: Diagnosis not present

## 2020-12-18 LAB — CBC WITH DIFFERENTIAL/PLATELET
Abs Immature Granulocytes: 0.56 10*3/uL — ABNORMAL HIGH (ref 0.00–0.07)
Basophils Absolute: 0.1 10*3/uL (ref 0.0–0.1)
Basophils Relative: 1 %
Eosinophils Absolute: 0.6 10*3/uL — ABNORMAL HIGH (ref 0.0–0.5)
Eosinophils Relative: 3 %
HCT: 29.9 % — ABNORMAL LOW (ref 39.0–52.0)
Hemoglobin: 9.8 g/dL — ABNORMAL LOW (ref 13.0–17.0)
Immature Granulocytes: 3 %
Lymphocytes Relative: 21 %
Lymphs Abs: 3.6 10*3/uL (ref 0.7–4.0)
MCH: 30.4 pg (ref 26.0–34.0)
MCHC: 32.8 g/dL (ref 30.0–36.0)
MCV: 92.9 fL (ref 80.0–100.0)
Monocytes Absolute: 1.3 10*3/uL — ABNORMAL HIGH (ref 0.1–1.0)
Monocytes Relative: 7 %
Neutro Abs: 11.4 10*3/uL — ABNORMAL HIGH (ref 1.7–7.7)
Neutrophils Relative %: 65 %
Platelets: 355 10*3/uL (ref 150–400)
RBC: 3.22 MIL/uL — ABNORMAL LOW (ref 4.22–5.81)
RDW: 17 % — ABNORMAL HIGH (ref 11.5–15.5)
WBC: 17.5 10*3/uL — ABNORMAL HIGH (ref 4.0–10.5)
nRBC: 0 % (ref 0.0–0.2)

## 2020-12-18 LAB — COMPREHENSIVE METABOLIC PANEL
ALT: 29 U/L (ref 0–44)
AST: 38 U/L (ref 15–41)
Albumin: 2.4 g/dL — ABNORMAL LOW (ref 3.5–5.0)
Alkaline Phosphatase: 117 U/L (ref 38–126)
Anion gap: 8 (ref 5–15)
BUN: 10 mg/dL (ref 8–23)
CO2: 22 mmol/L (ref 22–32)
Calcium: 7.9 mg/dL — ABNORMAL LOW (ref 8.9–10.3)
Chloride: 106 mmol/L (ref 98–111)
Creatinine, Ser: 1.03 mg/dL (ref 0.61–1.24)
GFR, Estimated: >60 mL/min (ref >60)
Glucose, Bld: 154 mg/dL — ABNORMAL HIGH (ref 70–99)
Potassium: 4.3 mmol/L (ref 3.5–5.1)
Sodium: 136 mmol/L (ref 135–145)
Total Bilirubin: 2.3 mg/dL — ABNORMAL HIGH (ref 0.3–1.2)
Total Protein: 5.9 g/dL — ABNORMAL LOW (ref 6.5–8.1)

## 2020-12-18 LAB — GLUCOSE, CAPILLARY
Glucose-Capillary: 137 mg/dL — ABNORMAL HIGH (ref 70–99)
Glucose-Capillary: 144 mg/dL — ABNORMAL HIGH (ref 70–99)
Glucose-Capillary: 157 mg/dL — ABNORMAL HIGH (ref 70–99)
Glucose-Capillary: 127 mg/dL — ABNORMAL HIGH (ref 70–99)

## 2020-12-18 LAB — MAGNESIUM: Magnesium: 2.1 mg/dL (ref 1.7–2.4)

## 2020-12-18 MED ORDER — BOOST / RESOURCE BREEZE PO LIQD CUSTOM
1.0000 | Freq: Three times a day (TID) | ORAL | Status: DC
  Administered 2020-12-18 – 2020-12-19 (×2): 1 via ORAL

## 2020-12-18 MED ORDER — PANCRELIPASE (LIP-PROT-AMYL) 12000-38000 UNITS PO CPEP
12000.0000 [IU] | ORAL_CAPSULE | Freq: Three times a day (TID) | ORAL | Status: DC
  Administered 2020-12-19 – 2020-12-21 (×9): 12000 [IU] via ORAL
  Filled 2020-12-18 (×9): qty 1

## 2020-12-18 MED ORDER — ACETAMINOPHEN 500 MG PO TABS
1000.0000 mg | ORAL_TABLET | Freq: Four times a day (QID) | ORAL | Status: DC
  Administered 2020-12-18 – 2020-12-19 (×6): 1000 mg via ORAL
  Administered 2020-12-20: 500 mg via ORAL
  Administered 2020-12-20: 1000 mg via ORAL
  Filled 2020-12-18 (×11): qty 2

## 2020-12-18 NOTE — Progress Notes (Addendum)
HEMATOLOGY-ONCOLOGY PROGRESS NOTE  SUBJECTIVE: Francisco Frank is followed by our office for locally advanced, borderline resectable pancreatic cancer.  He was due to begin neoadjuvant chemotherapy in our office next week.  He is now admitted with cholecystitis and is status post percutaneous cholecystectomy drainage tube.  He reports mild abdominal discomfort this morning.  Denies nausea and vomiting.  He has no other complaints today.  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  No other pathogenic variants detected in [facility] CustomNext-Cancer +RNAinsight Panel.  The report date is December 15, 2020.   The CustomNext-Cancer +RNAinsight Panel offered by [facility] and includes sequencing and rearrangement analysis for the following 91 genes: AIP, ALK, APC, ATM, AXIN2, BAP1, BARD1, BLM, BMPR1A, BRCA1, BRCA2, BRIP1, CDC73, CDH1, CDK4, CDKN1B, CDKN2A, CHEK2, CTNNA1, DICER1, FANCC, FH, FLCN, GALNT12, KIF1B, LZTR1, MAX, MEN1, MET, MLH1, MRE11A, MSH2, MSH3, MSH6, MUTYH, NBN, NF1, NF2, NTHL1, PALB2, PHOX2B, PMS2, POT1, PRKAR1A, PTCH1, PTEN, RAD50, RAD51C, RAD51D, RB1, RECQL, RET, SDHA, SDHAF2, SDHB, SDHC, SDHD, SMAD4, SMARCA4, SMARCB1, SMARCE1, STK11, SUFU, TMEM127, TP53, TSC1, TSC2, VHL and XRCC2 (sequencing and deletion/duplication); CASR, CFTR, CPA1, CTRC, EGFR, EGLN1, FAM175A, HOXB13, KIT, MITF, MLH3, PALLD, PDGFRA, POLD1, POLE, PRSS1, RINT1, RPS20, SPINK1 and TERT (sequencing only); EPCAM and GREM1 (deletion/duplication only). RNA data is routinely analyzed for use in variant interpretation for all genes.   12/24/2020 -   Chemotherapy    Patient is on Treatment Plan: PANCREAS MODIFIED FOLFIRINOX Q14D X 4 CYCLES        PHYSICAL EXAMINATION:  Vitals:   12/18/20 0230 12/18/20 0544  BP: 102/74 110/69  Pulse: 85 84  Resp: 14 16  Temp: 99.5 F (37.5 C) 98.1 F (36.7 C)  SpO2: 93% 94%   Filed Weights   12/12/20 2000  Weight: 68.2 kg    Intake/Output from previous day: 09/28 0701 - 09/29 0700 In: 1712.1 [I.V.:1509.8; IV Piggyback:192.3] Out: 1350 [Urine:1300; Drains:50]  GENERAL:alert, no distress and comfortable SKIN: skin color, texture, turgor are normal, no rashes or significant lesions EYES: normal, Conjunctiva are pink and non-injected, sclera clear OROPHARYNX:no exudate, no erythema and lips, buccal mucosa, and tongue normal  LUNGS: clear to auscultation and percussion with normal breathing effort HEART: regular rate & rhythm and no murmurs and no lower extremity edema ABDOMEN: Positive bowel sounds, soft, tenderness with palpation to the epigastric area NEURO: alert & oriented x 3 with fluent speech, no focal motor/sensory deficits  LABORATORY DATA:  I have reviewed the data as listed CMP Latest Ref Rng & Units 12/18/2020 12/17/2020 12/16/2020  Glucose 70 - 99 mg/dL 154(H) 87 105(H)  BUN 8 - 23 mg/dL 10 10 12   Creatinine 0.61 - 1.24 mg/dL 1.03 1.04 0.91  Sodium 135 - 145 mmol/L 136 137 139  Potassium 3.5 - 5.1 mmol/L 4.3 3.7 4.1  Chloride 98 - 111 mmol/L 106 104 108  CO2 22 - 32 mmol/L 22 26 23   Calcium 8.9 - 10.3 mg/dL 7.9(L) 8.0(L) 8.2(L)  Total Protein 6.5 - 8.1 g/dL 5.9(L) 5.9(L) 5.2(L)  Total Bilirubin 0.3 - 1.2 mg/dL 2.3(H) 2.3(H) 2.2(H)  Alkaline Phos 38 - 126 U/L  117 109 99  AST 15 - 41 U/L 38 34 34  ALT 0 - 44 U/L 29 28 30     Lab Results  Component Value Date   WBC 17.5 (H) 12/18/2020   HGB 9.8 (L) 12/18/2020   HCT 29.9 (L) 12/18/2020   MCV 92.9 12/18/2020   PLT 355 12/18/2020   NEUTROABS 11.4 (H) 12/18/2020    CT ABDOMEN PELVIS W WO CONTRAST  Result Date:  11/28/2020 CLINICAL DATA:  Staging pancreas mass. Evaluate for vascular invasion. EXAM: CT ABDOMEN AND PELVIS WITHOUT AND WITH CONTRAST TECHNIQUE: Multidetector CT imaging of the abdomen and pelvis was performed following the standard protocol before and following the bolus administration of intravenous contrast. CONTRAST:  62m OMNIPAQUE IOHEXOL 350 MG/ML SOLN COMPARISON:  MRI 11/06/2020. FINDINGS: Lower chest: No acute abnormality. Hepatobiliary: No focal liver lesion identified. Pneumobilia is identified status post percutaneous transhepatic biliary stent placement and internal bile duct stent placement. Interval decompression of previous dilated intrahepatic and common bile ducts. Pancreas: Again noted is atrophy of the body through tail of pancreas with diffuse main duct dilatation. Mass within head of pancreas is somewhat ill-defined measuring approximately 3.5 x 2.8 cm, image 60/3. There is no involvement scratch set scratch set No signs of encasement of the celiac trunk or superior mesenteric artery. There is partial encasement of the portal venous confluence (at least 180 degrees), image 32/4. There is also partial encasement (approximately 180 degrees) of the superior mesenteric vein just distal to the confluence, image 36/4. Ventral extension of tumor abuts the posterior wall of the proximal duodenum, image 32/4. Cannot exclude invasion. Spleen: Normal in size without focal abnormality. Adrenals/Urinary Tract: Normal adrenal glands. The kidneys are unremarkable. No mass or hydronephrosis. Urinary bladder unremarkable. Stomach/Bowel: Stomach is nondilated. No bowel wall thickening, inflammation, or distension. Vascular/Lymphatic: Aortic atherosclerosis without aneurysm. No abdominal adenopathy. No pelvic adenopathy. Reproductive: Prostate is unremarkable. Other: No abdominal wall hernia or abnormality. No abdominopelvic ascites. Musculoskeletal: No acute or significant osseous findings. IMPRESSION: 1. Mass  within head of pancreas is somewhat ill-defined measuring approximately 3.5 x 2.8 cm. There is partial encasement of the portal venous confluence and superior mesenteric vein. No signs of encasement of the celiac trunk or superior mesenteric artery. 2. No findings of nodal metastasis or solid organ metastasis within the abdomen or pelvis. 3. Status post percutaneous transhepatic biliary stent placement and internal bile duct stent placement with decompression of previous dilated intrahepatic and common bile ducts. 4. Aortic atherosclerosis. Aortic Atherosclerosis (ICD10-I70.0). Electronically Signed   By: TKerby MoorsM.D.   On: 11/28/2020 16:24   CT ABDOMEN PELVIS W CONTRAST  Addendum Date: 12/12/2020   ADDENDUM REPORT: 12/12/2020 09:21 ADDENDUM: Study discussed by telephone with PA WDeno Etienneon 12/12/2020 at 09:19. We discussed conservative treatment including antibiotics and hydration. [facility] Mr. KWeightmansituation. Electronically Signed   By: HGenevie AnnM.D.   On: 12/12/2020 09:21   Result Date: 12/12/2020 CLINICAL DATA:  70year old male status post removal of transhepatic internal/external biliary drainage catheter yesterday. Underlying malignant obstructed jaundice secondary to pancreatic cancer. Indwelling metal biliary stent. EXAM: CT ABDOMEN AND PELVIS WITH CONTRAST TECHNIQUE: Multidetector CT imaging of the abdomen and pelvis was performed using the standard protocol following bolus administration of intravenous contrast. CONTRAST:  883mOMNIPAQUE IOHEXOL 350 MG/ML SOLN COMPARISON:  CT Abdomen and Pelvis 11/28/2020 and earlier. FINDINGS: Lower chest: Minor atelectasis, greater on the left. Otherwise negative. Hepatobiliary: Mildly  dilated and inflamed gallbladder with confluent regional soft tissue stranding extending to the porta hepatis (series 2, image 28). Gallbladder wall thickening. But there is also gas within the  gallbladder lumen, similar to diffuse pneumobilia elsewhere. And the metallic CBD stent also contains gas and appears to be patent terminating in the duodenum. Subtle former drainage tract through the liver is identified with no adverse features. No perihepatic free fluid. Liver enhancement maintained. Pancreas: Spiculated pancreatic head mass with upstream ductal dilatation and pancreatic atrophy. Spleen: Negative. Adrenals/Urinary Tract: Negative. Symmetric renal enhancement and early contrast excretion. Stomach/Bowel: No dilated large or small bowel. Secondary inflammation of the hepatic flexure appears related to the gallbladder on coronal image 49. Appendix is normal on series 2, image 64. Decompressed and negative terminal ileum. No free air. The distal stomach and duodenum also appear actively inflamed at the porta hepatis. The distal duodenum appear spared. There is only mild gas and fluid distention of the stomach. Vascular/Lymphatic: Aortoiliac calcified atherosclerosis. Major arterial structures remain patent. The portal venous system remains patent. Small but numerous peripancreatic lymph nodes appear stable from earlier this month. Reproductive: Negative. Other: No pelvic free fluid. Musculoskeletal: Stable. No acute or suspicious osseous lesion identified. IMPRESSION: 1. Positive for Acute Cholecystitis, with confluent regional inflammation secondarily involving the gastric antrum, duodenum, and hepatic flexure of colon. But the metallic CBD stent appears patent with no adverse features. And there is pneumobilia within the gallbladder lumen. 2. Transhepatic biliary drain removed with no adverse features along the former drainage tract. 3. Known Pancreatic Tumor. Electronically Signed: By: Genevie Ann M.D. On: 12/12/2020 09:07   NM Hepato W/EF  Result Date: 12/16/2020 CLINICAL DATA:  Left-sided abdominal pain for several months. EXAM: NUCLEAR MEDICINE HEPATOBILIARY IMAGING TECHNIQUE: Sequential images  of the abdomen were obtained out to 60 minutes following intravenous administration of radiopharmaceutical. Due to nonvisualization of gallbladder at 60 minutes, 3 mg of morphine was infused intravenously, and imaging continued for another 30 minutes. RADIOPHARMACEUTICALS:  7.7 mCi Tc-107m Choletec IV COMPARISON:  None. FINDINGS: Prompt uptake and biliary excretion of activity by the liver is seen. Biliary activity passes into small bowel, consistent with patent common bile duct. However, no gallbladder activity is seen either before or following morphine administration, consistent with cystic duct obstruction. IMPRESSION: Absent gallbladder activity, consistent with cystic duct obstruction/acute cholecystitis. No evidence of biliary obstruction. Electronically Signed   By: JMarlaine HindM.D.   On: 12/16/2020 13:54   UKoreaAbdomen Limited  Result Date: 12/15/2020 CLINICAL DATA:  Possible cholecystitis seen on CT. Left upper quadrant epigastric abdominal pain. EXAM: ULTRASOUND ABDOMEN LIMITED RIGHT UPPER QUADRANT COMPARISON:  None. FINDINGS: Gallbladder: Air again seen within the gallbladder lumen consistent with biliary stent. 1.1 cm nonshadowing echogenic structure within the gallbladder lumen likely sludge. Polyp considered less likely given lack of internal flow. Mild gallbladder wall thickening measuring up to 4 mm. Small amount of pericholecystic fluid is present. Sonographic MPercell Millersign is negative per technologist. Common bile duct: Diameter: 4 mm Liver: No focal hepatic lesion. Pneumobilia again seen throughout the liver consistent with CBD stent. Portal vein is patent on color Doppler imaging with normal direction of blood flow towards the liver. Other: None. IMPRESSION: 1. Mild gallbladder wall thickening and pericholecystic fluid. Given that the patient's pain is located in the left abdomen and epigastric region, findings are unlikely to be related to acute cholecystitis. Findings could be related to  chronic cholecystitis which could be better evaluated with HIDA scan. 2. 1.1  cm nonshadowing echogenic structure within the proximal gallbladder lumen is favored to be sludge given lack of significant internal flow. Follow-up ultrasound should be performed in 6 months to document stability/resolution. Electronically Signed   By: Miachel Roux M.D.   On: 12/15/2020 16:08   IR Perc Cholecystostomy  Result Date: 12/17/2020 INDICATION: Acute calculus cholecystitis EXAM: ULTRASOUND FLUOROSCOPIC 10 FRENCH PERCUTANEOUS CHOLECYSTOSTOMY Date:  12/17/2020 12/17/2020 3:08 pm Radiologist:  M. Daryll Brod, MD Guidance:  ULTRASOUND AND FLUOROSCOPIC FLUOROSCOPY TIME:  THREE minutes 0 seconds (28 mGy). MEDICATIONS: 3.375 G ZOSYN, administered within 1 procedure ANESTHESIA/SEDATION: Moderate (conscious) sedation was employed during this procedure. A total of Versed 2.0 mg and Fentanyl 100 mcg was administered intravenously. Moderate Sedation Time: 26 minutes. The patient's level of consciousness and vital signs were monitored continuously by radiology nursing throughout the procedure under my direct supervision. CONTRAST:  77m OMNIPAQUE IOHEXOL 300 MG/ML  SOLN COMPLICATIONS: None immediate. PROCEDURE: Informed consent was obtained from the patient following explanation of the procedure, risks, benefits and alternatives. The patient understands, agrees and consents for the procedure. All questions were addressed. A time out was performed. Maximal barrier sterile technique utilized including caps, mask, sterile gowns, sterile gloves, large sterile drape, hand hygiene, and ChloraPrep. Previous imaging reviewed. Distended gallbladder was localized in the right upper quadrant through a lower intercostal space. Overlying skin marked. Under sterile conditions and local anesthesia, a 21 gauge access needle was advanced transhepatically into the gallbladder. Needle position confirmed with ultrasound. There was return of exudative bile.  Sample sent for culture. Guidewire inserted followed by tract dilatation to insert a 10 FPakistandrain. Drain catheter position confirmed in the gallbladder. Retention loop formed. Contrast injection confirms position. 30 cc purulent bile removed. Catheter secured with Prolene suture and connected to external gravity drainage bag. Sterile dressing applied. No immediate complication. Patient tolerated the procedure well. IMPRESSION: Successful ultrasound and fluoroscopic 10 French percutaneous cholecystostomy Electronically Signed   By: MJerilynn Mages  Shick M.D.   On: 12/17/2020 15:55   DG Chest Port 1 View  Result Date: 12/12/2020 CLINICAL DATA:  70year old male with abnormal pulmonary auscultation in the right lower lung. Abdominal pain following removal of internal/external biliary drain yesterday. EXAM: PORTABLE CHEST 1 VIEW COMPARISON:  Chest CTA 08/08/2020. FINDINGS: Portable AP semi upright view at 0832 hours. Lower lung volumes. Stable mild elevation of the right hemidiaphragm. Normal cardiac size and mediastinal contours. Visualized tracheal air column is within normal limits. Allowing for portable technique the lungs are clear. No pneumothorax. Negative visible bowel gas pattern. No acute osseous abnormality identified. IMPRESSION: Lower lung volumes.  No acute cardiopulmonary abnormality. Electronically Signed   By: HGenevie AnnM.D.   On: 12/12/2020 08:56   IR CHOLANGIOGRAM EXISTING TUBE  Result Date: 12/11/2020 INDICATION: 70year old male with malignant obstructed jaundice secondary to pancreatic cancer. He has an internal metallic stent as well as an internal/external biliary drainage catheter which has been capped. He is tolerating the capped catheter well. His bilirubin continues to trend down and is the lowest it has been since the time of stent placement. He finds the internal/external drainage catheter very painful. He presents today for transhepatic cholangiogram and possible drain removal. EXAM:  CHOLANGIOGRAM VIA EXISTING CATHETER MEDICATIONS: None ANESTHESIA/SEDATION: None FLUOROSCOPY TIME:  Fluoroscopy Time: 0 minutes 36 seconds (4 mGy). COMPLICATIONS: None immediate. PROCEDURE: Informed written consent was obtained from the patient after a thorough discussion of the procedural risks, benefits and alternatives. All questions were addressed. Maximal Sterile Barrier Technique was utilized  including caps, mask, sterile gowns, sterile gloves, sterile drape, hand hygiene and skin antiseptic. A timeout was performed prior to the initiation of the procedure. The existing catheter was freed from the retention suture, transected and removed over a Bentson wire. A 9 French vascular sheath was advanced over the wire and positioned in the right hepatic duct. A transhepatic cholangiogram was then performed. Minimal biliary ductal dilatation. Contrast material passes freely through the widely patent stent and into the duodenum. There may be minimal sludge present. The decision was made to proceed with drain removal. The wire and sheath were removed. A bandage was placed over the access site. IMPRESSION: Widely patent internal self expanding covered biliary stent. Given toleration of capped trial and decreasing bilirubin, the decision was made to remove the internal/external biliary drainage catheter. Should patient developed recurrent symptoms of obstruction, the stent should be approachable endoscopically in the future. Electronically Signed   By: Jacqulynn Cadet M.D.   On: 12/11/2020 15:39   IR BILIARY STENT(S) EXISTING ACCESS INC DILATION CATH EXCHANGE  Result Date: 11/19/2020 INDICATION: 70 year old male with malignant obstructed jaundice secondary to pancreatic cancer. Percutaneous internal/external biliary drainage catheter was placed on 11/12/2020 after an unsuccessful attempted ERCP on 11/11/2020. Patient remains jaundiced and presents today for transhepatic biliary stent placement. EXAM: 1. Biliary stent  placement through existing access 2. Biliary drain exchange MEDICATIONS: 2 g Rocephin; The antibiotic was administered within an appropriate time frame prior to the initiation of the procedure. ANESTHESIA/SEDATION: Moderate (conscious) sedation was employed during this procedure. A total of Versed 2 mg and Fentanyl 100 mcg was administered intravenously. Moderate Sedation Time: 15 minutes. The patient's level of consciousness and vital signs were monitored continuously by radiology nursing throughout the procedure under my direct supervision. FLUOROSCOPY TIME:  Fluoroscopy Time: 3 minutes 18 seconds (42 mGy). COMPLICATIONS: None immediate. PROCEDURE: Informed written consent was obtained from the patient after a thorough discussion of the procedural risks, benefits and alternatives. All questions were addressed. Maximal Sterile Barrier Technique was utilized including caps, mask, sterile gowns, sterile gloves, sterile drape, hand hygiene and skin antiseptic. A timeout was performed prior to the initiation of the procedure. Local anesthesia was attained by infiltration with 1% lidocaine. The retention suture on the existing internal/external biliary drainage catheter was cut. The drainage catheter was transected and removed over a Bentson wire. A 5 French angled catheter was advanced over the wire and into the duodenum. The catheter and wire combination were advanced into the proximal jejunum. The Bentson wire was exchanged for an Amplatz wire. The 5 French catheter was exchanged for a 9 [place] sheath which was advanced into the duodenum. A pull-back cholangiogram was then performed confirming persistent occlusion of the mid and distal common bile duct. The occlusion length measures approximately 5.8 cm. Therefore, a 10 mm x 6 cm wall flex covered biliary stent was selected. The sheath was readvanced over the wire into the duodenum. The stent was positioned across the stenosis before being unsheathed. The stent was  then successfully deployed. The Amplatz wire was brought back and capped in the common hepatic duct. A new 10.2 French Dawson Mueller drainage catheter was advanced over the wire and formed in the common hepatic duct. The catheter was secured to the skin with 0 Prolene suture. Sterile bandages were applied. IMPRESSION: 1. Successful placement of a self expanding covered biliary stent measuring 10 x 60 mm. 2. Biliary drain exchange with a 10.2 French Dawson Mueller catheter left in the common hepatic duct  above the biliary stent. The tube was left capped to initiate a physiologic trial. PLAN: Return Interventional Radiology in 2 weeks for serum bilirubin evaluation, cholangiogram and possible drain removal. Electronically Signed   By: Jacqulynn Cadet M.D.   On: 11/19/2020 13:14    ASSESSMENT AND PLAN: Pancreas cancer Outside CT-apparent pancreas abnormality (we do not have a copy of the report) MRI abdomen 11/06/2020-pancreatic body and tail atrophy with duct dilatation up to 9 mm.  Both ducts undergo an abrupt transition in the region of the pancreatic head.  Non border deforming pancreatic head mass measuring 3.2 x 2.6 cm.  No arterial involvement by tumor.  SMV adjacent to presumed tumor without encasement.  Portal vein uninvolved.  No abdominal adenopathy.  Left periaortic 7 mm node not pathologic by size criteria.  No ascites.  Subtle left-sided pericolonic nodule 5 mm. EUS/ERCP 11/11/2020-EGD showed gastritis which was biopsied, mucosal changes in the duodenum, mucosal changes in the duodenum sweep (gastric antral and oxyntic mucosa with no specific histopathologic changes; Warthin Starry stain negative for H pylori).  EUS showed an irregular mass in the pancreatic head measuring 26 mm x 30 mm; the outer margins were irregular.  There was sonographic evidence suggesting invasion into the portal vein (manifested by abutment) and the superior mesenteric vein (manifested by abutment); an intact interface was  seen between the mass and the superior mesenteric artery and celiac trunk suggesting a lack of invasion.  Endosonographic imaging in the visualized portion of the liver showed no mass.  There was dilatation of the common bile duct and the common hepatic duct.  No malignant appearing lymph nodes were visualized in the celiac region, peripancreatic region, porta hepatis region.  FNA biopsy of the pancreas head mass showed malignant cells consistent with adenocarcinoma.  Biliary access could not be obtained.  Cholangiogram 11/12/2020-severe intrahepatic biliary dilatation.  A peripheral right hepatic bile duct was successfully cannulated.  Internal/external biliary drain placed.  Cytology on biliary drain bile showed malignant cells consistent with adenocarcinoma.   CT chest 11/13/2020-no evidence of metastatic disease.   CT pelvis 11/13/2020-nonspecific circumferential wall thickening of the cecum measuring up to 12 mm in thickness.  Otherwise no evidence of intrapelvic metastases.   11/19/2020-biliary stent placement, biliary drain exchange 11/28/2020-exchange of internal/external biliary drain secondary to persistent jaundice CT abdomen/pelvis 11/28/2020-pancreas head mass, 3.5 x 2.8 cm, no encasement of the celiac trunk or SMA, partial encasement of the portal venous confluence-at least 180 degrees, partial encasement of the SMV-approximately 180 degrees, ventral extension of tumor abuts the posterior duodenum, no adenopathy, percutaneous biliary stent with decompression of previous dilated intrahepatic and common bile ducts 12/11/2020-removal of internal/external biliary drain, patent covered biliary stent   Painless jaundice-internal/external biliary drain placed 11/12/2020.  Follow-up with Interventional Radiology 11/21/2020. Weight loss Diabetes diagnosed June 2022 Change in bowel habits February 2022 Colonoscopy 08/06/2020-8 mm polyp in the sigmoid colon, a few small and large mouth diverticula in the sigmoid  colon, normal mucosa found in the entire colon with biopsies for histology taken with a cold forceps from the right colon and left colon for evaluation of microscopic colitis (right colon-colonic mucosa with no significant pathologic findings; left colon-colonic mucosa with no significant pathologic findings; sigmoid polyp-tubular adenoma, negative for high-grade dysplasia). Hypertension Hospital admission 12/12/2020-acute cholecystitis Cholecystostomy tube 12/17/2020  Mr. Blanck has been admitted to the hospital with acute cholecystitis.  He had a percutaneous chole drainage tube placed.  He continues to have abdominal discomfort.  His T bili  and WBC remain stable.  General surgery is following him closely.  He has borderline resectable pancreatic cancer.  He was due to begin systemic chemotherapy next week in our office.  He will need a Port-A-Cath placed prior to chemotherapy.  Dr. Zenia Resides was planning to place Port-A-Cath this week but this has been canceled due to hospitalization.  General surgery aware of need for Port-A-Cath placement and may try to place port prior to DC pending WBC and timing.  Otherwise they will reschedule as an outpatient.  Recommendations: 1.  Management of cholecystitis per general surgery/IR. 2.  Port-A-Cath placement per general surgery. 3.  Outpatient follow-up at the cancer center as previously scheduled.  Future Appointments  Date Time Provider [place]  12/24/2020  8:45 AM [facility] PHLEBOTOMIST [facility] None  12/24/2020  9:00 AM [facility] FLUSH ROOM [facility] None  12/24/2020  9:30 AM Ladell Pier, MD [facility] None  12/24/2020 10:30 AM [facility] CHAIR 5 [facility] None  12/24/2020 11:15 AM Karie Mainland, RD [facility] None  12/26/2020 11:00 AM [facility] FLUSH ROOM [facility] None  01/07/2021  9:00 AM [facility] PHLEBOTOMIST [facility] None  01/07/2021  9:15 AM [facility] FLUSH ROOM [facility] None  01/07/2021  9:45 AM Owens Shark, NP [facility] None   01/07/2021 11:00 AM [facility] CHAIR 2 [facility] None  01/09/2021  1:45 PM [facility] FLUSH ROOM [facility] None      LOS: 6 days   Mikey Bussing, DNP, AGPCNP-BC, AOCNP 12/18/20 Francisco Frank was interviewed and examined.  He is admitted with cholecystitis in the setting of pancreas cancer and a bile duct stent.  He underwent cholecystostomy tube placement yesterday.  Francisco Frank has borderline resectable pancreas cancer.  He is scheduled to begin chemotherapy next week.  The bilirubin is now adequate to begin chemotherapy.  He will need a Port-A-Cath for chemotherapy.  We will delay chemotherapy if the Port-A-Cath is not placed prior to discharge.  Please call oncology over the weekend as needed.  I will check on him 12/22/2020 if he is not discharged.  I was present for greater than 50% of today's visit I performed medical stage making.

## 2020-12-18 NOTE — Progress Notes (Signed)
Subjective: Can't tell much difference in pain after perc chole drain except he has more now secondary to the drain.  Still with pain in the epigastrium and LUQ but describes as dull achy type pain.  Can't apparently eat/drink anything cold as it causes severe stomach pain.    ROS: See above, otherwise other systems negative  Objective: Vital signs in last 24 hours: Temp:  [97.8 F (36.6 C)-99.5 F (37.5 C)] 98.1 F (36.7 C) (09/29 0544) Pulse Rate:  [65-89] 84 (09/29 0544) Resp:  [8-19] 16 (09/29 0544) BP: (93-146)/(69-93) 110/69 (09/29 0544) SpO2:  [92 %-100 %] 94 % (09/29 0544) Last BM Date: 12/17/20  Intake/Output from previous day: 09/28 0701 - 09/29 0700 In: 1712.1 [I.V.:1509.8; IV Piggyback:192.3] Out: 1350 [Urine:1300; Drains:50] Intake/Output this shift: No intake/output data recorded.  PE: Heart: regular Lungs: CTAB Abd: soft, tender in epigastrium and LUQ, not tender in RUQ except around drain as expected and drain with minimal output, ND, +BS   Lab Results:  Recent Labs    12/17/20 1013 12/18/20 0511  WBC 16.2* 17.5*  HGB 9.9* 9.8*  HCT 29.4* 29.9*  PLT 319 355   BMET Recent Labs    12/17/20 1013 12/18/20 0511  NA 137 136  K 3.7 4.3  CL 104 106  CO2 26 22  GLUCOSE 87 154*  BUN 10 10  CREATININE 1.04 1.03  CALCIUM 8.0* 7.9*   PT/INR No results for input(s): LABPROT, INR in the last 72 hours.  CMP     Component Value Date/Time   NA 136 12/18/2020 0511   K 4.3 12/18/2020 0511   CL 106 12/18/2020 0511   CO2 22 12/18/2020 0511   GLUCOSE 154 (H) 12/18/2020 0511   BUN 10 12/18/2020 0511   CREATININE 1.03 12/18/2020 0511   CREATININE 1.07 12/10/2020 1309   CALCIUM 7.9 (L) 12/18/2020 0511   PROT 5.9 (L) 12/18/2020 0511   ALBUMIN 2.4 (L) 12/18/2020 0511   AST 38 12/18/2020 0511   AST 41 12/10/2020 1309   ALT 29 12/18/2020 0511   ALT 57 (H) 12/10/2020 1309   ALKPHOS 117 12/18/2020 0511   BILITOT 2.3 (H) 12/18/2020 0511   BILITOT  4.9 (HH) 12/10/2020 1309   GFRNONAA >60 12/18/2020 0511   GFRNONAA >60 12/10/2020 1309   Lipase     Component Value Date/Time   LIPASE 16 12/12/2020 0653       Studies/Results: NM Hepato W/EF  Result Date: 12/16/2020 CLINICAL DATA:  Left-sided abdominal pain for several months. EXAM: NUCLEAR MEDICINE HEPATOBILIARY IMAGING TECHNIQUE: Sequential images of the abdomen were obtained out to 60 minutes following intravenous administration of radiopharmaceutical. Due to nonvisualization of gallbladder at 60 minutes, 3 mg of morphine was infused intravenously, and imaging continued for another 30 minutes. RADIOPHARMACEUTICALS:  7.7 mCi Tc-89m  Choletec IV COMPARISON:  None. FINDINGS: Prompt uptake and biliary excretion of activity by the liver is seen. Biliary activity passes into small bowel, consistent with patent common bile duct. However, no gallbladder activity is seen either before or following morphine administration, consistent with cystic duct obstruction. IMPRESSION: Absent gallbladder activity, consistent with cystic duct obstruction/acute cholecystitis. No evidence of biliary obstruction. Electronically Signed   By: Marlaine Hind M.D.   On: 12/16/2020 13:54   IR Perc Cholecystostomy  Result Date: 12/17/2020 INDICATION: Acute calculus cholecystitis EXAM: ULTRASOUND FLUOROSCOPIC 10 FRENCH PERCUTANEOUS CHOLECYSTOSTOMY Date:  12/17/2020 12/17/2020 3:08 pm Radiologist:  M. Daryll Brod, MD Guidance:  ULTRASOUND AND FLUOROSCOPIC FLUOROSCOPY  TIME:  THREE minutes 0 seconds (28 mGy). MEDICATIONS: 3.375 G ZOSYN, administered within 1 procedure ANESTHESIA/SEDATION: Moderate (conscious) sedation was employed during this procedure. A total of Versed 2.0 mg and Fentanyl 100 mcg was administered intravenously. Moderate Sedation Time: 26 minutes. The patient's level of consciousness and vital signs were monitored continuously by radiology nursing throughout the procedure under my direct supervision. CONTRAST:   8mL OMNIPAQUE IOHEXOL 300 MG/ML  SOLN COMPLICATIONS: None immediate. PROCEDURE: Informed consent was obtained from the patient following explanation of the procedure, risks, benefits and alternatives. The patient understands, agrees and consents for the procedure. All questions were addressed. A time out was performed. Maximal barrier sterile technique utilized including caps, mask, sterile gowns, sterile gloves, large sterile drape, hand hygiene, and ChloraPrep. Previous imaging reviewed. Distended gallbladder was localized in the right upper quadrant through a lower intercostal space. Overlying skin marked. Under sterile conditions and local anesthesia, a 21 gauge access needle was advanced transhepatically into the gallbladder. Needle position confirmed with ultrasound. There was return of exudative bile. Sample sent for culture. Guidewire inserted followed by tract dilatation to insert a 10 [place] drain. Drain catheter position confirmed in the gallbladder. Retention loop formed. Contrast injection confirms position. 30 cc purulent bile removed. Catheter secured with Prolene suture and connected to external gravity drainage bag. Sterile dressing applied. No immediate complication. Patient tolerated the procedure well. IMPRESSION: Successful ultrasound and fluoroscopic 10 French percutaneous cholecystostomy Electronically Signed   By: Jerilynn Mages.  Shick M.D.   On: 12/17/2020 15:55    Anti-infectives: Anti-infectives (From admission, onward)    Start     Dose/Rate Route Frequency Ordered Stop   12/12/20 1800  metroNIDAZOLE (FLAGYL) IVPB 500 mg  Status:  Discontinued        500 mg 100 mL/hr over 60 Minutes Intravenous Every 8 hours 12/12/20 1214 12/12/20 1214   12/12/20 1400  piperacillin-tazobactam (ZOSYN) IVPB 3.375 g        3.375 g 12.5 mL/hr over 240 Minutes Intravenous Every 8 hours 12/12/20 1142     12/12/20 1300  ceFEPIme (MAXIPIME) 2 g in sodium chloride 0.9 % 100 mL IVPB  Status:  Discontinued        2  g 200 mL/hr over 30 Minutes Intravenous Every 12 hours 12/12/20 1138 12/12/20 1138   12/12/20 1000  cefTRIAXone (ROCEPHIN) 2 g in sodium chloride 0.9 % 100 mL IVPB        2 g 200 mL/hr over 30 Minutes Intravenous  Once 12/12/20 0951 12/12/20 1024   12/12/20 1000  metroNIDAZOLE (FLAGYL) IVPB 500 mg        500 mg 100 mL/hr over 60 Minutes Intravenous  Once 12/12/20 0951 12/12/20 1130        Assessment/Plan Pancreatic head cancer with obstructed CBD, s/p stenting and cholecystitis, s/p perc chole drain placement -cont abx therapy for cholecystitis  -IR to place perc chole drain 9/28.  Patient can't really tell this has made a difference and he continues to describe a vague pain in his epigastrium and LUQ of unclear etiology, could be related to underlying malignancy.  Unclear why he can't eat or drink cold things and why this causes so much pain.  I encouraged him to try and eat and drink as best as he can.  Will try breeze instead of ensure to see if he can drink this at room temp. -hyoscyamine did help with some of his episodic type pain symptoms from previously as well.  Cont this for now. -TB  down to 2.3, WBC 17K -port placement prior to DC pending WBC and timing by Dr. Zenia Resides.  If unable will reschedule as outpatient.  FEN - regular, IVFs VTE - Lovenox ID - zosyn  DM HTN HLD PCM - albumin is 2.1 AKI - cr 1.03   LOS: 6 days    Henreitta Cea , [facility] 12/18/2020, 8:45 AM Please see Amion for pager number during day hours 7:00am-4:30pm or 7:00am -11:30am on weekends

## 2020-12-18 NOTE — Progress Notes (Signed)
PROGRESS NOTE    Francisco Frank  NIO:[id number] DOB: [birth year] DOA: 12/12/2020 PCP: Kennieth Rad, MD    Chief Complaint  Patient presents with   Abdominal Pain    Brief Narrative:  Francisco Frank is a 70 y.o. male with medical history significant for recently diagnosed pancreatic cancer complicated by biliary duct obstruction with abdominal pain, associated with nausea, vomiting for over a month. Imaging showed acute cholecystitis, he was started on IV antibiotics and IV Dilaudid.  Patient reports his abdominal pain has not improved despite antibiotics and IV Dilaudid.  General surgery and IR on board .  HIDA scan done , showed Absent gallbladder activity, consistent with cystic duct obstruction/acute cholecystitis. Plan for percutaneous cholecystostomy by IR.     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  Patient made n.p.o. initially general surgery consultation obtained as well as IR consultation. -Sepsis physiology improved. -Blood cultures negative x5 days. -Sepsis physiology improving. -Patient noted to have still epigastric and persistent right upper quadrant pain. -CT abdomen and pelvis done concerning for inflammation and stranding in the right upper quadrant surrounding the gallbladder. -Right upper quadrant ultrasound done with mild gallbladder wall thickening and pericholecystic  fluid, given patient's pain located left abdomen epigastric findings could be related to chronic cholecystitis better evaluated with HIDA scan.  1.1 cm nonshadowing echogenic structure within the proximal gallbladder lumen favored to be sludge given lack of significant into flow.  Follow-up ultrasound to be done in 6 months to document stability/resolution. -HIDA scan done with absent gallbladder activity, consistent with cystic duct obstruction/acute cholecystitis.  No evidence of biliary obstruction. -Patient seen by general surgery who recommended percutaneous cholecystostomy tube placement per IR. -Per general surgery hycosamine may have helped with some of his episodic type pain symptoms. -Leukocytosis fluctuating currently at 17.5.   -Status post percutaneous cholecystostomy tube placement 12/17/2020 with cultures pending.   -Tolerating current diet with some clinical improvement this afternoon.  -Continue IV Zosyn pending culture results  -Continue current pain management.   -Per general surgery.  2.  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 -General surgery to determine Port-A-Cath placement can be done just prior to discharge during this hospitalization though may need to be rescheduled in the outpatient setting. -Oncology, Dr. Benay Spice informed of admission and per patient he was seen by Dr. Benay Spice early this morning. -Resume home regimen Creon.   3.  Insulin-dependent diabetes mellitus II -Hemoglobin A1c 9.6  (11/09/2020). -CBG 137 this morning.   -Continue Lantus 7 units nightly, SSI.   -Outpatient follow-up with PCP.   4.  Hypomagnesemia/hypokalemia -Magnesium at 2.1 -Potassium at 4.3.  5.  Coronary artery artery disease -Stable. -Was on Plavix and aspirin at home prior to admission. -Continue statin.   -Continue to hold  Plavix due to concerns  patient may need surgery and will defer resumption of Plavix timing to general surgery/IR.   -Follow.  6.  Anemia of chronic disease -Hemoglobin stable at 9.8. -Transfusion threshold hemoglobin < 7.  7.  AKI with mild metabolic acidosis -Likely secondary to problem #1 and prerenal azotemia. -With hydration.  Creatinine at 1.03.   8.  Hypertension -BP stable.  Somewhat soft.   -Continue to hold Norvasc, atenolol.    DVT prophylaxis: Lovenox Code Status: Full Family Communication: Updated patient and wife at bedside. Disposition:   Status is: Inpatient  Remains inpatient appropriate because:Inpatient level of care appropriate due to severity of illness  Dispo: The patient is from: Home              Anticipated d/c is to: Home              Patient currently is not medically stable to d/c.   Difficult to place patient No       Consultants:  IR: Dr. Shelton Silvas 12/17/2020 General surgery Oncology  Procedures:  CT abdomen and pelvis 12/12/2020 Chest x-ray 12/12/2020 HIDA scan 12/16/2020 Percutaneous cholecystostomy tube placement IR 12/17/2020 Abdominal ultrasound 12/15/2020 IR cholangiogram 12/11/2020 per Dr. Laurence Ferrari    Antimicrobials:  IV Rocephin 9/23/ 2022x1 dose IV Flagyl 12/12/2020 x 1 dose IV Zosyn 12/12/2020>>>>>    Subjective: Patient sitting up in bed.  States some improvement with abdominal pain this afternoon.  Able to tolerate lunch better than he did with breakfast.  Now complaining of pain around drain site.  Improvement with epigastric pain.  No nausea or vomiting.  No shortness of breath.  No chest pain.  Wife at bedside.   Objective: Vitals:   12/17/20 2031 12/17/20 2217 12/18/20 0230 12/18/20 0544  BP: 125/83 97/78 102/74 110/69  Pulse: 82 89 85 84  Resp: _0 Temp: 98.5 F (36.9 C) 99.2 F (37.3 C) 99.5 F (37.5 C) 98.1 F (36.7 C)  TempSrc: Oral Oral Oral Oral  SpO2: 93% 92% 93% 94%  Weight:      Height:         Intake/Output Summary (Last 24 hours) at 12/18/2020 1332 Last data filed at 12/18/2020 1115 Gross per 24 hour  Intake 1280.8 ml  Output 850 ml  Net 430.8 ml    Filed Weights   12/12/20 2000  Weight: 68.2 kg    Examination:  General exam: : NAD Respiratory system: CTA B anterior lung fields.  No wheezes, no rhonchi.  Speaking in full sentences.  Normal respiratory effort. Cardiovascular system: Regular rate and rhythm no murmurs rubs or gallops.  No JVD.  No lower extremity edema.  Gastrointestinal system: Abdomen soft, less tender to palpation in the epigastrium, some tenderness to palpation around drain site.  Positive bowel sounds.  No rebound.  No guarding.  Biliary drain intact with bilious drainage noted. Central nervous system: Alert and oriented. No focal neurological deficits. Extremities: Symmetric 5 x 5 power. Skin: No rashes, lesions or ulcers Psychiatry: Judgement and insight appear normal. Mood & affect appropriate.   Data Reviewed: I have personally reviewed following labs and imaging studies  CBC: Recent Labs  Lab 12/12/20 0653 12/13/20 0546 12/14/20 0645 12/16/20 0559 12/17/20 1013 12/18/20 0511  WBC 16.0* 15.8* 16.3* 16.1* 16.2* 17.5*  NEUTROABS 13.4*  --   --  9.7* 10.4* 11.4*  HGB 10.1* 9.3* 9.4* 9.8* 9.9* 9.8*  HCT 30.8* 27.7* 27.7* 29.7* 29.4* 29.9*  MCV 92.8 92.3 92.3 93.1 91.6 92.9  PLT 308 263 253 269 319 355     Basic Metabolic Panel: Recent Labs  Lab 12/12/20 0653 12/13/20 0546 12/14/20 0645 12/16/20 0559 12/17/20 1013 12/18/20 0511  NA 135 136 143 139 137 136  K 3.1* 3.2* 3.8 4.1 3.7 4.3  CL 99 108 114* 108 104 106  CO2 20* 22 19* _0 GLUCOSE 259* 135* 113* 105* 87 154*  BUN 31* _1 CREATININE 1.44* 1.16 1.28* 0.91 1.04 1.03  CALCIUM 8.9 7.9* 8.1* 8.2* 8.0* 7.9*  MG 1.8  --   --   --  1.7 2.1     GFR: Estimated Creatinine Clearance: 63.5 mL/min (by C-G formula based on SCr of 1.03 mg/dL).  Liver  Function Tests: Recent Labs  Lab 12/13/20 0546 12/14/20 0645 12/16/20 0559 12/17/20 1013 12/18/20 0511  AST 33 37 34 34 38  ALT 36 35 _2 ALKPHOS 108 98 99 109 117  BILITOT 2.8* 2.7* 2.2* 2.3* 2.3*  PROT 5.6* 5.5* 5.2* 5.9* 5.9*  ALBUMIN 2.2* 2.1* 2.1* 2.1* 2.4*     CBG: Recent Labs  Lab 12/17/20 1736 12/17/20 2034 12/17/20 2356 12/18/20 0815 12/18/20 1158  GLUCAP 88 72 94 137* 144*      Recent Results (from the past 240 hour(s))  Blood culture (routine x 2)     Status: None   Collection Time: 12/12/20  6:55 AM   Specimen: BLOOD RIGHT HAND  Result Value Ref Range Status   Specimen Description   Final    BLOOD RIGHT HAND Performed at [facility], [place] [address] [place], [place] [address]    Special Requests   Final    BOTTLES DRAWN AEROBIC AND ANAEROBIC Blood Culture adequate volume Performed at [facility] [address], [place], [place] [address]    Culture   Final    NO GROWTH 5 DAYS Performed at [facility], [place] [address] [place], [place] [address]    Report Status 12/17/2020 FINAL  Final  Blood culture (routine x 2)     Status: None   Collection Time: 12/12/20  7:44 AM   Specimen: BLOOD  Result Value Ref Range Status   Specimen Description   Final    BLOOD LEFT ANTECUBITAL Performed at [facility] [place] [address] [place], [place] [address]    Special Requests   Final    BOTTLES DRAWN AEROBIC AND ANAEROBIC Blood Culture results may not be optimal due to an excessive volume of blood received in culture bottles Performed at [facility] [address], [place], [place] [address]    Culture   Final    NO GROWTH 5 DAYS Performed at [facility], [place] [address] [place] [address] [place],  [address]    Report Status 12/17/2020 FINAL  Final  Resp Panel by RT-PCR (Flu A&B, Covid) Nasopharyngeal Swab     Status: None   Collection Time: 12/12/20 10:24 AM    Specimen: Nasopharyngeal Swab; Nasopharyngeal(NP) swabs in vial transport medium  Result Value Ref Range Status   SARS Coronavirus 2 by RT PCR NEGATIVE NEGATIVE Final    Comment: (NOTE) SARS-CoV-2 target nucleic acids are NOT DETECTED.  The SARS-CoV-2 RNA is generally detectable in upper respiratory specimens during the acute phase of infection. The lowest concentration of SARS-CoV-2 viral copies this assay can detect is 138 copies/mL. A negative result does not preclude SARS-Cov-2 infection and should not be used  as the sole basis for treatment or other patient management decisions. A negative result may occur with  improper specimen collection/handling, submission of specimen other than nasopharyngeal swab, presence of viral mutation(s) within the areas targeted by this assay, and inadequate number of viral copies(<138 copies/mL). A negative result must be combined with clinical observations, patient history, and epidemiological information. The expected result is Negative.  Fact Sheet for Patients:  EntrepreneurPulse.com.au  Fact Sheet for Healthcare Providers:  IncredibleEmployment.be  This test is no t yet approved or cleared by the [place] FDA and  has been authorized for detection and/or diagnosis of SARS-CoV-2 by FDA under an Emergency Use Authorization (EUA). This EUA will remain  in effect (meaning this test can be used) for the duration of the COVID-19 declaration under Section 564(b)(1) of the Act, 21 U.S.C.section 360bbb-3(b)(1), unless the authorization is terminated  or revoked sooner.       Influenza A by PCR NEGATIVE NEGATIVE Final   Influenza B by PCR NEGATIVE NEGATIVE Final    Comment: (NOTE) The Xpert Xpress SARS-CoV-2/FLU/RSV plus assay is intended as an aid in the diagnosis of influenza from Nasopharyngeal swab specimens and should not be used as a sole basis for treatment. Nasal washings and aspirates are  unacceptable for Xpert Xpress SARS-CoV-2/FLU/RSV testing.  Fact Sheet for Patients: EntrepreneurPulse.com.au  Fact Sheet for Healthcare Providers: IncredibleEmployment.be  This test is not yet approved or cleared by the [place] FDA and has been authorized for detection and/or diagnosis of SARS-CoV-2 by FDA under an Emergency Use Authorization (EUA). This EUA will remain in effect (meaning this test can be used) for the duration of the COVID-19 declaration under Section 564(b)(1) of the Act, 21 U.S.C. section 360bbb-3(b)(1), unless the authorization is terminated or revoked.  Performed at [facility], [place] [address], [place], [place] [address]           Radiology Studies: IR Perc Cholecystostomy  Result Date: 12/17/2020 INDICATION: Acute calculus cholecystitis EXAM: ULTRASOUND FLUOROSCOPIC 10 FRENCH PERCUTANEOUS CHOLECYSTOSTOMY Date:  12/17/2020 12/17/2020 3:08 pm Radiologist:  M. Daryll Brod, MD Guidance:  ULTRASOUND AND FLUOROSCOPIC FLUOROSCOPY TIME:  THREE minutes 0 seconds (28 mGy). MEDICATIONS: 3.375 G ZOSYN, administered within 1 procedure ANESTHESIA/SEDATION: Moderate (conscious) sedation was employed during this procedure. A total of Versed 2.0 mg and Fentanyl 100 mcg was administered intravenously. Moderate Sedation Time: 26 minutes. The patient's level of consciousness and vital signs were monitored continuously by radiology nursing throughout the procedure under my direct supervision. CONTRAST:  73m OMNIPAQUE IOHEXOL 300 MG/ML  SOLN COMPLICATIONS: None immediate. PROCEDURE: Informed consent was obtained from the patient following explanation of the procedure, risks, benefits and alternatives. The patient understands, agrees and consents for the procedure. All questions were addressed. A time out was performed. Maximal barrier sterile technique utilized including caps, mask, sterile gowns, sterile gloves, large sterile  drape, hand hygiene, and ChloraPrep. Previous imaging reviewed. Distended gallbladder was localized in the right upper quadrant through a lower intercostal space. Overlying skin marked. Under sterile conditions and local anesthesia, a 21 gauge access needle was advanced transhepatically into the gallbladder. Needle position confirmed with ultrasound. There was return of exudative bile. Sample sent for culture. Guidewire inserted followed by tract dilatation to insert a 10 FPakistandrain. Drain catheter position confirmed in the gallbladder. Retention loop formed. Contrast injection confirms position. 30 cc purulent bile removed. Catheter secured with Prolene suture and connected to external gravity drainage bag. Sterile dressing applied. No immediate complication. Patient tolerated the  procedure well. IMPRESSION: Successful ultrasound and fluoroscopic 10 French percutaneous cholecystostomy Electronically Signed   By: Jerilynn Mages.  Shick M.D.   On: 12/17/2020 15:55        Scheduled Meds:  acetaminophen  1,000 mg Oral Q6H   atorvastatin  40 mg Oral Daily   celecoxib  200 mg Oral Once   enoxaparin (LOVENOX) injection  40 mg Subcutaneous Q24H   feeding supplement  1 Container Oral TID BM   hyoscyamine  0.375 mg Oral Q12H   insulin aspart  0-15 Units Subcutaneous TID WC   insulin glargine  7 Units Subcutaneous QHS   insulin glargine-yfgn  7 Units Subcutaneous QHS   sodium chloride flush  5 mL Intracatheter Q8H   Continuous Infusions:  piperacillin-tazobactam (ZOSYN)  IV 3.375 g (12/18/20 0555)     LOS: 6 days    Time spent: 40 minutes    Irine Seal, MD [facility]   To contact the attending provider between 7A-7P or the covering provider during after hours 7P-7A, please log into the web site [email or web address] and access using universal [place] password for that web site. If you do not have the password, please call the hospital operator.  12/18/2020, 1:32 PM

## 2020-12-19 DIAGNOSIS — I2510 Atherosclerotic heart disease of native coronary artery without angina pectoris: Secondary | ICD-10-CM | POA: Diagnosis not present

## 2020-12-19 DIAGNOSIS — I10 Essential (primary) hypertension: Secondary | ICD-10-CM | POA: Diagnosis not present

## 2020-12-19 DIAGNOSIS — C250 Malignant neoplasm of head of pancreas: Secondary | ICD-10-CM | POA: Diagnosis not present

## 2020-12-19 DIAGNOSIS — K810 Acute cholecystitis: Secondary | ICD-10-CM | POA: Diagnosis not present

## 2020-12-19 LAB — CBC WITH DIFFERENTIAL/PLATELET
Abs Immature Granulocytes: 0.31 10*3/uL — ABNORMAL HIGH (ref 0.00–0.07)
Basophils Absolute: 0.1 10*3/uL (ref 0.0–0.1)
Basophils Relative: 1 %
Eosinophils Absolute: 0.6 10*3/uL — ABNORMAL HIGH (ref 0.0–0.5)
Eosinophils Relative: 5 %
HCT: 29.6 % — ABNORMAL LOW (ref 39.0–52.0)
Hemoglobin: 9.7 g/dL — ABNORMAL LOW (ref 13.0–17.0)
Immature Granulocytes: 2 %
Lymphocytes Relative: 16 %
Lymphs Abs: 2 10*3/uL (ref 0.7–4.0)
MCH: 29.9 pg (ref 26.0–34.0)
MCHC: 32.8 g/dL (ref 30.0–36.0)
MCV: 91.4 fL (ref 80.0–100.0)
Monocytes Absolute: 0.9 10*3/uL (ref 0.1–1.0)
Monocytes Relative: 7 %
Neutro Abs: 8.8 10*3/uL — ABNORMAL HIGH (ref 1.7–7.7)
Neutrophils Relative %: 69 %
Platelets: 349 10*3/uL (ref 150–400)
RBC: 3.24 MIL/uL — ABNORMAL LOW (ref 4.22–5.81)
RDW: 16.8 % — ABNORMAL HIGH (ref 11.5–15.5)
WBC: 12.7 10*3/uL — ABNORMAL HIGH (ref 4.0–10.5)
nRBC: 0 % (ref 0.0–0.2)

## 2020-12-19 LAB — COMPREHENSIVE METABOLIC PANEL
ALT: 25 U/L (ref 0–44)
AST: 31 U/L (ref 15–41)
Albumin: 2.1 g/dL — ABNORMAL LOW (ref 3.5–5.0)
Alkaline Phosphatase: 102 U/L (ref 38–126)
Anion gap: 6 (ref 5–15)
BUN: 10 mg/dL (ref 8–23)
CO2: 26 mmol/L (ref 22–32)
Calcium: 7.8 mg/dL — ABNORMAL LOW (ref 8.9–10.3)
Chloride: 105 mmol/L (ref 98–111)
Creatinine, Ser: 0.97 mg/dL (ref 0.61–1.24)
GFR, Estimated: >60 mL/min (ref >60)
Glucose, Bld: 171 mg/dL — ABNORMAL HIGH (ref 70–99)
Potassium: 3.7 mmol/L (ref 3.5–5.1)
Sodium: 137 mmol/L (ref 135–145)
Total Bilirubin: 1.7 mg/dL — ABNORMAL HIGH (ref 0.3–1.2)
Total Protein: 5.7 g/dL — ABNORMAL LOW (ref 6.5–8.1)

## 2020-12-19 LAB — GLUCOSE, CAPILLARY
Glucose-Capillary: 174 mg/dL — ABNORMAL HIGH (ref 70–99)
Glucose-Capillary: 141 mg/dL — ABNORMAL HIGH (ref 70–99)
Glucose-Capillary: 105 mg/dL — ABNORMAL HIGH (ref 70–99)
Glucose-Capillary: 120 mg/dL — ABNORMAL HIGH (ref 70–99)
Glucose-Capillary: 161 mg/dL — ABNORMAL HIGH (ref 70–99)

## 2020-12-19 LAB — MAGNESIUM: Magnesium: 2 mg/dL (ref 1.7–2.4)

## 2020-12-19 MED ORDER — PROCHLORPERAZINE EDISYLATE 10 MG/2ML IJ SOLN
10.0000 mg | Freq: Once | INTRAMUSCULAR | Status: AC
  Administered 2020-12-19: 10 mg via INTRAVENOUS
  Filled 2020-12-19: qty 2

## 2020-12-19 MED ORDER — PROSOURCE PLUS PO LIQD
30.0000 mL | Freq: Three times a day (TID) | ORAL | Status: DC
  Administered 2020-12-19 – 2020-12-22 (×3): 30 mL via ORAL
  Filled 2020-12-19 (×4): qty 30

## 2020-12-19 MED ORDER — ENSURE ENLIVE PO LIQD
237.0000 mL | Freq: Two times a day (BID) | ORAL | Status: DC
  Administered 2020-12-19 – 2020-12-21 (×3): 237 mL via ORAL

## 2020-12-19 MED ORDER — ADULT MULTIVITAMIN W/MINERALS CH
1.0000 | ORAL_TABLET | Freq: Every day | ORAL | Status: DC
  Administered 2020-12-19 – 2020-12-23 (×4): 1 via ORAL
  Filled 2020-12-19 (×5): qty 1

## 2020-12-19 MED ORDER — PROCHLORPERAZINE EDISYLATE 10 MG/2ML IJ SOLN
10.0000 mg | Freq: Four times a day (QID) | INTRAMUSCULAR | Status: DC | PRN
  Administered 2020-12-19: 10 mg via INTRAVENOUS
  Filled 2020-12-19: qty 2

## 2020-12-19 MED ORDER — ONDANSETRON HCL 4 MG/2ML IJ SOLN
4.0000 mg | Freq: Four times a day (QID) | INTRAMUSCULAR | Status: DC | PRN
  Administered 2020-12-19 (×2): 4 mg via INTRAVENOUS
  Filled 2020-12-19 (×2): qty 2

## 2020-12-19 NOTE — Progress Notes (Signed)
PROGRESS NOTE    Francisco Frank  XTG:[id number] DOB: 10-31-1950 DOA: 12/12/2020 PCP: Kennieth Rad, MD    Chief Complaint  Patient presents with   Abdominal Pain    Brief Narrative:  Francisco Frank is a 70 y.o. male with medical history significant for recently diagnosed pancreatic cancer complicated by biliary duct obstruction with abdominal pain, associated with nausea, vomiting for over a month. Imaging showed acute cholecystitis, he was started on IV antibiotics and IV Dilaudid.  Patient reports his abdominal pain has not improved despite antibiotics and IV Dilaudid.  General surgery and IR on board .  HIDA scan done , showed Absent gallbladder activity, consistent with cystic duct obstruction/acute cholecystitis. Plan for percutaneous cholecystostomy by IR.     Assessment & Plan:   Principal Problem:   Acute cholecystitis Active Problems:   HTN (hypertension)   Type 2 diabetes mellitus without complication, with long-term current use of insulin (HCC)   CAD in native artery   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 (HCC)   Cancer of head of pancreas (HCC)   Sepsis without acute organ dysfunction (HCC)   Hypomagnesemia   Hypokalemia   AKI (acute kidney injury) ([place])   1 sepsis secondary to acute cholecystitis -On admission patient met criteria for sepsis with hypotension which improved with IV fluids, noted to have a leukocytosis, findings consistent with acute cholecystitis.  Patient made n.p.o. initially general surgery consultation obtained as well as IR consultation. -Sepsis physiology improved. -Blood cultures negative x5 days. -Sepsis physiology improving. -Patient with some improvement with epigastric and upper abdominal pain.   -CT abdomen and pelvis done concerning for inflammation and stranding in the right upper quadrant surrounding the gallbladder. -Right upper quadrant ultrasound done with mild gallbladder wall thickening and pericholecystic fluid,  given patient's pain located left abdomen epigastric findings could be related to chronic cholecystitis better evaluated with HIDA scan.  1.1 cm nonshadowing echogenic structure within the proximal gallbladder lumen favored to be sludge given lack of significant into flow.  Follow-up ultrasound to be done in 6 months to document stability/resolution. -HIDA scan done with absent gallbladder activity, consistent with cystic duct obstruction/acute cholecystitis.  No evidence of biliary obstruction. -Patient seen by general surgery who recommended percutaneous cholecystostomy tube placement per IR. -Per general surgery hycosamine may have helped with some of his episodic type pain symptoms. -Leukocytosis trending back down currently at 12.7.  -Status post percutaneous cholecystostomy tube placement 12/17/2020 with cultures pending.   -Tolerating current diet however with complaints of nausea today not relieved with IV Zofran.   -Trial of IV Compazine.   -Continue IV Zosyn pending culture results.   -Continue current pain management.   -Per general surgery.    2.  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 -General surgery to determine Port-A-Cath placement can be done just prior to discharge during this hospitalization though may need to be rescheduled in the outpatient setting. -Oncology, Dr. Benay Spice informed of admission and per patient he was seen by Dr. Benay Spice early this morning. -Creon resumed.   -Patient with some complaints of nausea today not relieved with IV Zofran, we will place on IV Compazine as needed.  3.  Insulin-dependent diabetes mellitus II -Hemoglobin A1c 9.6  (11/09/2020). -CBG 141 this morning.   -Continue Semglee 7 units daily, SSI.   -Outpatient follow-up with PCP.   4.  Hypomagnesemia/hypokalemia -Magnesium  at 2.0.   Potassium at 3.7. -Repeat labs in the a.m.  5.  Coronary artery artery disease -Stable. -Was on Plavix and aspirin at home prior to admission. -Continue statin.   -Continue to hold Plavix due to concerns patient may need surgery and will defer resumption of Plavix timing to general surgery/IR.   -Follow.  6.  Anemia of chronic disease -Hemoglobin stable at 9.7.Marland Kitchen -Transfusion threshold hemoglobin < 7.  7.  AKI with mild metabolic acidosis -Likely secondary to problem #1 and prerenal azotemia. -Renal function improved with hydration.   8.  Hypertension -BP stable.   -Antihypertensive medications on hold.   -Follow.   DVT prophylaxis: Lovenox Code Status: Full Family Communication: Updated patient.  No family at bedside. Disposition:   Status is: Inpatient  Remains inpatient appropriate because:Inpatient level of care appropriate due to severity of illness  Dispo: The patient is from: Home              Anticipated d/c is to: Home              Patient currently is not medically stable to d/c.   Difficult to place patient No       Consultants:  IR: Dr. Shelton Silvas 12/17/2020 General surgery Oncology  Procedures:  CT abdomen and pelvis 12/12/2020 Chest x-ray 12/12/2020 HIDA scan 12/16/2020 Percutaneous cholecystostomy tube placement IR 12/17/2020 Abdominal ultrasound 12/15/2020 IR cholangiogram 12/11/2020 per Dr. Laurence Ferrari    Antimicrobials:  IV Rocephin 9/23/ 2022x1 dose IV Flagyl 12/12/2020 x 1 dose IV Zosyn 12/12/2020>>>>>    Subjective: Laying in bed.  States not feeling too well today.  Denies any chest pain.  No shortness of breath.  Tolerating oral intake however stated made him nauseous today with no improvement on IV Zofran.  Denies any abdominal pain with oral intake.  No emesis.    Objective: Vitals:   12/18/20 1404 12/18/20 2041 12/19/20 0330 12/19/20 1316  BP: 105/78 109/77 125/75 (!) 139/92  Pulse: 72 70 79 70  Resp: 16 19 20 17   Temp: 98.4 F (36.9  C) 98.7 F (37.1 C) 98 F (36.7 C) 98 F (36.7 C)  TempSrc: Oral Oral Oral Oral  SpO2: 96% 97% 99% 97%  Weight:      Height:        Intake/Output Summary (Last 24 hours) at 12/19/2020 1333 Last data filed at 12/19/2020 1015 Gross per 24 hour  Intake 260 ml  Output 360 ml  Net -100 ml    Filed Weights   12/12/20 2000  Weight: 68.2 kg    Examination:  General exam: : NAD Respiratory system: CTA B.  No wheezes, no rhonchi.  Speaking in full sentences.  Normal respiratory effort. Cardiovascular system: Regular rate and rhythm no murmurs rubs or gallops.  No JVD.  No lower extremity edema.  Gastrointestinal system: Abdomen soft, nontender, nondistended, positive bowel sounds.  No rebound.  No guarding.  Percutaneous drain with bilious drainage.  Some tenderness to palpation around drain site. Central nervous system: Alert and oriented. No focal neurological deficits. Extremities: Symmetric 5 x 5 power. Skin: No rashes, lesions or ulcers Psychiatry: Judgement and insight appear normal. Mood & affect appropriate.  Data Reviewed: I have personally reviewed following labs and imaging studies  CBC: Recent Labs  Lab 12/14/20 0645 12/16/20 0559 12/17/20 1013 12/18/20 0511 12/19/20 0557  WBC 16.3* 16.1* 16.2* 17.5* 12.7*  NEUTROABS  --  9.7* 10.4* 11.4* 8.8*  HGB 9.4* 9.8* 9.9* 9.8* 9.7*  HCT 27.7*  29.7* 29.4* 29.9* 29.6*  MCV 92.3 93.1 91.6 92.9 91.4  PLT 253 269 319 355 349     Basic Metabolic Panel: Recent Labs  Lab 12/14/20 0645 12/16/20 0559 12/17/20 1013 12/18/20 0511 12/19/20 0557  NA 143 139 137 136 137  K 3.8 4.1 3.7 4.3 3.7  CL 114* 108 104 106 105  CO2 19* 23 26 22 26   GLUCOSE 113* 105* 87 154* 171*  BUN 17 12 10 10 10   CREATININE 1.28* 0.91 1.04 1.03 0.97  CALCIUM 8.1* 8.2* 8.0* 7.9* 7.8*  MG  --   --  1.7 2.1 2.0     GFR: Estimated Creatinine Clearance: 67.4 mL/min (by C-G formula based on SCr of 0.97 mg/dL).  Liver Function Tests: Recent  Labs  Lab 12/14/20 0645 12/16/20 0559 12/17/20 1013 12/18/20 0511 12/19/20 0557  AST 37 34 34 38 31  ALT 35 30 28 29 25   ALKPHOS 98 99 109 117 102  BILITOT 2.7* 2.2* 2.3* 2.3* 1.7*  PROT 5.5* 5.2* 5.9* 5.9* 5.7*  ALBUMIN 2.1* 2.1* 2.1* 2.4* 2.1*     CBG: Recent Labs  Lab 12/18/20 1626 12/18/20 2043 12/19/20 0322 12/19/20 0802 12/19/20 1154  GLUCAP 157* 127* 174* 141* 105*      Recent Results (from the past 240 hour(s))  Blood culture (routine x 2)     Status: None   Collection Time: 12/12/20  6:55 AM   Specimen: BLOOD RIGHT HAND  Result Value Ref Range Status   Specimen Description   Final    BLOOD RIGHT HAND Performed at [facility], [place] [address], [place], [place] [address]    Special Requests   Final    BOTTLES DRAWN AEROBIC AND ANAEROBIC Blood Culture adequate volume Performed at [facility] [address], [place], [place] [address]    Culture   Final    NO GROWTH 5 DAYS Performed at [facility], [place] [address], [place], [place] [address]    Report Status 12/17/2020 FINAL  Final  Blood culture (routine x 2)     Status: None   Collection Time: 12/12/20  7:44 AM   Specimen: BLOOD  Result Value Ref Range Status   Specimen Description   Final    BLOOD LEFT ANTECUBITAL Performed at [facility] [address], [place], [place] [address]    Special Requests   Final    BOTTLES DRAWN AEROBIC AND ANAEROBIC Blood Culture results may not be optimal due to an excessive volume of blood received in culture bottles Performed at [facility] [address] [place],  [address]    Culture   Final    NO GROWTH 5 DAYS Performed at [facility], [place] [address] [place],  [address]    Report Status 12/17/2020 FINAL  Final  Resp Panel by RT-PCR (Flu A&B, Covid) Nasopharyngeal Swab     Status: None   Collection Time: 12/12/20 10:24 AM   Specimen: Nasopharyngeal  Swab; Nasopharyngeal(NP) swabs in vial transport medium  Result Value Ref Range Status   SARS Coronavirus 2 by RT PCR NEGATIVE NEGATIVE Final    Comment: (NOTE) SARS-CoV-2 target nucleic acids are NOT DETECTED.  The SARS-CoV-2 RNA is generally detectable in upper respiratory specimens during the acute phase of infection. The lowest concentration of SARS-CoV-2 viral copies this assay can detect is 138 copies/mL. A negative result does not preclude SARS-Cov-2 infection and should not be used as the  sole basis for treatment or other patient management decisions. A negative result may occur with  improper specimen collection/handling, submission of specimen other than nasopharyngeal swab, presence of viral mutation(s) within the areas targeted by this assay, and inadequate number of viral copies(<138 copies/mL). A negative result must be combined with clinical observations, patient history, and epidemiological information. The expected result is Negative.  Fact Sheet for Patients:  EntrepreneurPulse.com.au  Fact Sheet for Healthcare Providers:  IncredibleEmployment.be  This test is no t yet approved or cleared by the [place] FDA and  has been authorized for detection and/or diagnosis of SARS-CoV-2 by FDA under an Emergency Use Authorization (EUA). This EUA will remain  in effect (meaning this test can be used) for the duration of the COVID-19 declaration under Section 564(b)(1) of the Act, 21 U.S.C.section 360bbb-3(b)(1), unless the authorization is terminated  or revoked sooner.       Influenza A by PCR NEGATIVE NEGATIVE Final   Influenza B by PCR NEGATIVE NEGATIVE Final    Comment: (NOTE) The Xpert Xpress SARS-CoV-2/FLU/RSV plus assay is intended as an aid in the diagnosis of influenza from Nasopharyngeal swab specimens and should not be used as a sole basis for treatment. Nasal washings and aspirates are unacceptable for Xpert Xpress  SARS-CoV-2/FLU/RSV testing.  Fact Sheet for Patients: EntrepreneurPulse.com.au  Fact Sheet for Healthcare Providers: IncredibleEmployment.be  This test is not yet approved or cleared by the [place] FDA and has been authorized for detection and/or diagnosis of SARS-CoV-2 by FDA under an Emergency Use Authorization (EUA). This EUA will remain in effect (meaning this test can be used) for the duration of the COVID-19 declaration under Section 564(b)(1) of the Act, 21 U.S.C. section 360bbb-3(b)(1), unless the authorization is terminated or revoked.  Performed at [facility], [place] [address], [place], [place] [address]           Radiology Studies: IR Perc Cholecystostomy  Result Date: 12/17/2020 INDICATION: Acute calculus cholecystitis EXAM: ULTRASOUND FLUOROSCOPIC 10 FRENCH PERCUTANEOUS CHOLECYSTOSTOMY Date:  12/17/2020 12/17/2020 3:08 pm Radiologist:  M. Daryll Brod, MD Guidance:  ULTRASOUND AND FLUOROSCOPIC FLUOROSCOPY TIME:  THREE minutes 0 seconds (28 mGy). MEDICATIONS: 3.375 G ZOSYN, administered within 1 procedure ANESTHESIA/SEDATION: Moderate (conscious) sedation was employed during this procedure. A total of Versed 2.0 mg and Fentanyl 100 mcg was administered intravenously. Moderate Sedation Time: 26 minutes. The patient's level of consciousness and vital signs were monitored continuously by radiology nursing throughout the procedure under my direct supervision. CONTRAST:  50m OMNIPAQUE IOHEXOL 300 MG/ML  SOLN COMPLICATIONS: None immediate. PROCEDURE: Informed consent was obtained from the patient following explanation of the procedure, risks, benefits and alternatives. The patient understands, agrees and consents for the procedure. All questions were addressed. A time out was performed. Maximal barrier sterile technique utilized including caps, mask, sterile gowns, sterile gloves, large sterile drape, hand hygiene, and  ChloraPrep. Previous imaging reviewed. Distended gallbladder was localized in the right upper quadrant through a lower intercostal space. Overlying skin marked. Under sterile conditions and local anesthesia, a 21 gauge access needle was advanced transhepatically into the gallbladder. Needle position confirmed with ultrasound. There was return of exudative bile. Sample sent for culture. Guidewire inserted followed by tract dilatation to insert a 10 FPakistandrain. Drain catheter position confirmed in the gallbladder. Retention loop formed. Contrast injection confirms position. 30 cc purulent bile removed. Catheter secured with Prolene suture and connected to external gravity drainage bag. Sterile dressing applied. No immediate complication. Patient tolerated the procedure well.  IMPRESSION: Successful ultrasound and fluoroscopic 10 French percutaneous cholecystostomy Electronically Signed   By: Jerilynn Mages.  Shick M.D.   On: 12/17/2020 15:55        Scheduled Meds:  (feeding supplement) PROSource Plus  30 mL Oral TID BM   acetaminophen  1,000 mg Oral Q6H   atorvastatin  40 mg Oral Daily   celecoxib  200 mg Oral Once   enoxaparin (LOVENOX) injection  40 mg Subcutaneous Q24H   feeding supplement  1 Container Oral TID BM   hyoscyamine  0.375 mg Oral Q12H   insulin aspart  0-15 Units Subcutaneous TID WC   insulin glargine-yfgn  7 Units Subcutaneous QHS   lipase/protease/amylase  12,000 Units Oral TID with meals   multivitamin with minerals  1 tablet Oral Daily   prochlorperazine  10 mg Intravenous Once   sodium chloride flush  5 mL Intracatheter Q8H   Continuous Infusions:  piperacillin-tazobactam (ZOSYN)  IV 3.375 g (12/19/20 0600)     LOS: 7 days    Time spent: 35 minutes    Irine Seal, MD [facility]   To contact the attending provider between 7A-7P or the covering provider during after hours 7P-7A, please log into the web site [email or web address] and access using universal [place]  password for that web site. If you do not have the password, please call the hospital operator.  12/19/2020, 1:33 PM

## 2020-12-19 NOTE — Progress Notes (Signed)
Subjective: Pain is much improved from yesterday and tolerated breakfast and cold liquids well though now with persistent mild to moderate nausea. No emesis. Last BM last night  ROS: See above, otherwise other systems negative  Objective: Vital signs in last 24 hours: Temp:  [98 F (36.7 C)-98.7 F (37.1 C)] 98 F (36.7 C) (09/30 0330) Pulse Rate:  [70-79] 79 (09/30 0330) Resp:  [16-20] 20 (09/30 0330) BP: (105-125)/(75-78) 125/75 (09/30 0330) SpO2:  [96 %-99 %] 99 % (09/30 0330) Last BM Date: 12/18/20  Intake/Output from previous day: 09/29 0701 - 09/30 0700 In: 260 [P.O.:240; I.V.:5] Out: -  Intake/Output this shift: Total I/O In: 245 [P.O.:240; Other:5] Out: 360 [Urine:300; Drains:60]  PE: Heart: regular Lungs: CTAB Abd: soft, mildly TTP around drain as expected and drain with bilious output, ND, +BS MSK: calves soft without edema or TTP bilaterally Psych: A&O x3   Lab Results:  Recent Labs    12/18/20 0511 12/19/20 0557  WBC 17.5* 12.7*  HGB 9.8* 9.7*  HCT 29.9* 29.6*  PLT 355 349    BMET Recent Labs    12/18/20 0511 12/19/20 0557  NA 136 137  K 4.3 3.7  CL 106 105  CO2 22 26  GLUCOSE 154* 171*  BUN 10 10  CREATININE 1.03 0.97  CALCIUM 7.9* 7.8*    PT/INR No results for input(s): LABPROT, INR in the last 72 hours.  CMP     Component Value Date/Time   NA 137 12/19/2020 0557   K 3.7 12/19/2020 0557   CL 105 12/19/2020 0557   CO2 26 12/19/2020 0557   GLUCOSE 171 (H) 12/19/2020 0557   BUN 10 12/19/2020 0557   CREATININE 0.97 12/19/2020 0557   CREATININE 1.07 12/10/2020 1309   CALCIUM 7.8 (L) 12/19/2020 0557   PROT 5.7 (L) 12/19/2020 0557   ALBUMIN 2.1 (L) 12/19/2020 0557   AST 31 12/19/2020 0557   AST 41 12/10/2020 1309   ALT 25 12/19/2020 0557   ALT 57 (H) 12/10/2020 1309   ALKPHOS 102 12/19/2020 0557   BILITOT 1.7 (H) 12/19/2020 0557   BILITOT 4.9 (HH) 12/10/2020 1309   GFRNONAA >60 12/19/2020 0557   GFRNONAA >60  12/10/2020 1309   Lipase     Component Value Date/Time   LIPASE 16 12/12/2020 0653       Studies/Results: IR Perc Cholecystostomy  Result Date: 12/17/2020 INDICATION: Acute calculus cholecystitis EXAM: ULTRASOUND FLUOROSCOPIC 10 FRENCH PERCUTANEOUS CHOLECYSTOSTOMY Date:  12/17/2020 12/17/2020 3:08 pm Radiologist:  Jerilynn Mages. Daryll Brod, MD Guidance:  ULTRASOUND AND FLUOROSCOPIC FLUOROSCOPY TIME:  THREE minutes 0 seconds (28 mGy). MEDICATIONS: 3.375 G ZOSYN, administered within 1 procedure ANESTHESIA/SEDATION: Moderate (conscious) sedation was employed during this procedure. A total of Versed 2.0 mg and Fentanyl 100 mcg was administered intravenously. Moderate Sedation Time: 26 minutes. The patient's level of consciousness and vital signs were monitored continuously by radiology nursing throughout the procedure under my direct supervision. CONTRAST:  45mL OMNIPAQUE IOHEXOL 300 MG/ML  SOLN COMPLICATIONS: None immediate. PROCEDURE: Informed consent was obtained from the patient following explanation of the procedure, risks, benefits and alternatives. The patient understands, agrees and consents for the procedure. All questions were addressed. A time out was performed. Maximal barrier sterile technique utilized including caps, mask, sterile gowns, sterile gloves, large sterile drape, hand hygiene, and ChloraPrep. Previous imaging reviewed. Distended gallbladder was localized in the right upper quadrant through a lower intercostal space. Overlying skin marked. Under sterile conditions and local anesthesia, a  21 gauge access needle was advanced transhepatically into the gallbladder. Needle position confirmed with ultrasound. There was return of exudative bile. Sample sent for culture. Guidewire inserted followed by tract dilatation to insert a 10 [place] drain. Drain catheter position confirmed in the gallbladder. Retention loop formed. Contrast injection confirms position. 30 cc purulent bile removed. Catheter  secured with Prolene suture and connected to external gravity drainage bag. Sterile dressing applied. No immediate complication. Patient tolerated the procedure well. IMPRESSION: Successful ultrasound and fluoroscopic 10 French percutaneous cholecystostomy Electronically Signed   By: Jerilynn Mages.  Shick M.D.   On: 12/17/2020 15:55    Anti-infectives: Anti-infectives (From admission, onward)    Start     Dose/Rate Route Frequency Ordered Stop   12/12/20 1800  metroNIDAZOLE (FLAGYL) IVPB 500 mg  Status:  Discontinued        500 mg 100 mL/hr over 60 Minutes Intravenous Every 8 hours 12/12/20 1214 12/12/20 1214   12/12/20 1400  piperacillin-tazobactam (ZOSYN) IVPB 3.375 g        3.375 g 12.5 mL/hr over 240 Minutes Intravenous Every 8 hours 12/12/20 1142     12/12/20 1300  ceFEPIme (MAXIPIME) 2 g in sodium chloride 0.9 % 100 mL IVPB  Status:  Discontinued        2 g 200 mL/hr over 30 Minutes Intravenous Every 12 hours 12/12/20 1138 12/12/20 1138   12/12/20 1000  cefTRIAXone (ROCEPHIN) 2 g in sodium chloride 0.9 % 100 mL IVPB        2 g 200 mL/hr over 30 Minutes Intravenous  Once 12/12/20 0951 12/12/20 1024   12/12/20 1000  metroNIDAZOLE (FLAGYL) IVPB 500 mg        500 mg 100 mL/hr over 60 Minutes Intravenous  Once 12/12/20 0951 12/12/20 1130        Assessment/Plan Pancreatic head cancer with obstructed CBD, s/p stenting and cholecystitis, s/p perc chole drain placement -cont abx therapy for cholecystitis - pending perc culture results -IR perc chole drain 9/28. Pain has improved today though now with nausea -hyoscyamine did help with some of his episodic type pain symptoms from previously as well.  Cont this for now. -TB down to 1.7 (2.3), WBC 12.1K -port placement prior to DC pending WBC and timing by Dr. Zenia Resides.  If unable will reschedule as outpatient.  FEN - regular, IVFs VTE - Lovenox ID - zosyn  DM HTN HLD PCM - albumin is 2.1 AKI - cr 1.03   LOS: 7 days    Winferd Humphrey ,  [facility] 12/19/2020, 12:26 PM Please see Amion for pager number during day hours 7:00am-4:30pm or 7:00am -11:30am on weekends

## 2020-12-19 NOTE — Care Management Important Message (Signed)
Medicare IM printed for Social Work at [facility] to give to the patient 

## 2020-12-19 NOTE — Progress Notes (Signed)
Nutrition Follow-up  INTERVENTION:   -Boost Breeze po TID, each supplement provides 250 kcal and 9 grams of protein  -Prosource Plus PO TID, each provides 100 kcals and 15g protein  -D/c Magic cups, pt states cold liquids causing pain  -Multivitamin with minerals daily  NUTRITION DIAGNOSIS:   Inadequate oral intake related to vomiting, other (see comment) (abdominal pain) as evidenced by per patient/family report.  Ongoing.  GOAL:   Patient will meet greater than or equal to 90% of their needs  Not meeting.  MONITOR:   PO intake, Supplement acceptance, Diet advancement, Labs, Weight trends, I & O's  ASSESSMENT:   70 year old male who presented tot the ED on 9/23 with with abdominal pain and vomiting after having internal/external biliary drain removed by IR on 9/22. PMH of newly-diagnosed pancreatic cancer (has not yet started on chemotherapy) complicated by biliary obstruction, DM, HLD, HTN. Pt admitted with sepsis secondary to acute cholecystitis.  9/23 - clear liquids 9/24 - full liquids 9/28: s/p perc cholecystostomy  Patient not eating well. Only ordered a muffin for breakfast. Reports not wanting cold beverages as they cause him pain. Surgery changed supplement order to Boost Breeze-room temperature. Pt accepting 1 daily. Will order Prosource supplements as well. Will d/c Magic cup order.  Admission weight: 150 lbs.  Medications: CREON, Zofran  Labs reviewed:  CBGs: 127-174   Diet Order:   Diet Order             Diet regular Room service appropriate? Yes; Fluid consistency: Thin  Diet effective now                   EDUCATION NEEDS:   Not appropriate for education at this time  Skin:  Skin Assessment: Reviewed RN Assessment (incision R upper flank)  Last BM:  9/29  Height:   Ht Readings from Last 1 Encounters:  12/12/20 5\' 8"  (1.727 m)    Weight:   Wt Readings from Last 1 Encounters:  12/12/20 68.2 kg    BMI:  Body mass index is  22.86 kg/m.  Estimated Nutritional Needs:   Kcal:  2000-2200  Protein:  100-115 grams  Fluid:  >/= 2.0 L  Clayton Bibles, MS, RD, LDN Inpatient Clinical Dietitian Contact information available via Amion

## 2020-12-19 NOTE — TOC Initial Note (Signed)
Transition of Care [facility]) - Initial/Assessment Note    Patient Details  Name: Francisco Frank MRN: [id number] Date of Birth: 11/08/1950  [facility]) CM/SW Contact:    Lynnell Catalan, RN Phone Number: 12/19/2020, 11:12 AM  Clinical Narrative:                 Pt from home with spouse. Nursing staff to do teaching on how to care for new per chole drain. TOC will continue to follow.  Expected Discharge Plan: Home/Self Care Barriers to Discharge: Continued Medical Work up   Patient Goals and CMS Choice   Expected Discharge Plan and Services Expected Discharge Plan: Home/Self Care         Expected Discharge Date:  (unknown)                 Prior Living Arrangements/Services         Need for Family Participation in Patient Care: Yes (Comment) Care giver support system in place?: Yes (comment)      Activities of Daily Living Home Assistive Devices/Equipment: Eyeglasses, Hearing aid (bilateral hearing aids) ADL Screening (condition at time of admission) Patient's cognitive ability adequate to safely complete daily activities?: Yes Is the patient deaf or have difficulty hearing?: Yes (wears bilateral hearing aids) Does the patient have difficulty seeing, even when wearing glasses/contacts?: No Does the patient have difficulty concentrating, remembering, or making decisions?: No Patient able to express need for assistance with ADLs?: Yes Does the patient have difficulty dressing or bathing?: No Independently performs ADLs?: Yes (appropriate for developmental age) Does the patient have difficulty walking or climbing stairs?: No Weakness of Legs: None Weakness of Arms/Hands: None  Permission Sought/Granted                  Emotional Assessment              Admission diagnosis:  Acute cholecystitis [K81.0] Sepsis without acute organ dysfunction, due to unspecified organism [facility]) [A41.9] Patient Active Problem List   Diagnosis Date Noted   Sepsis  without acute organ dysfunction ([place])    Hypomagnesemia    Hypokalemia    AKI (acute kidney injury) ([place])    Genetic testing 12/16/2020   Acute cholecystitis 12/12/2020   Cancer of head of pancreas ([place]) 12/10/2020   Protein-calorie malnutrition, severe 11/29/2020   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 ([place]) 11/27/2020   CAD in native artery 11/10/2020   Pancreatic mass 11/09/2020   Hyperbilirubinemia 11/09/2020   Cholestatic pruritus 11/09/2020   HTN (hypertension) 11/09/2020   Type 2 diabetes mellitus without complication, with long-term current use of insulin ([place]) 11/09/2020   Loss of weight 09/03/2020   Fatigue 09/03/2020   Polydipsia 09/03/2020   Change in bowel habit 07/10/2020   Generalized abdominal pain 07/10/2020   PCP:  Kennieth Rad, MD Pharmacy:   [id number] [address], [place] [address] Phone: [phone] Fax: [phone]     Social Determinants of Health (SDOH) Interventions    Readmission Risk Interventions Readmission Risk Prevention Plan 12/19/2020  Transportation Screening Complete  PCP or Specialist Appt within 5-7 Days Complete  Home Care Screening Complete  Medication Review (RN CM) Complete

## 2020-12-20 DIAGNOSIS — I10 Essential (primary) hypertension: Secondary | ICD-10-CM | POA: Diagnosis not present

## 2020-12-20 DIAGNOSIS — C250 Malignant neoplasm of head of pancreas: Secondary | ICD-10-CM | POA: Diagnosis not present

## 2020-12-20 DIAGNOSIS — K810 Acute cholecystitis: Secondary | ICD-10-CM | POA: Diagnosis not present

## 2020-12-20 DIAGNOSIS — I2510 Atherosclerotic heart disease of native coronary artery without angina pectoris: Secondary | ICD-10-CM | POA: Diagnosis not present

## 2020-12-20 LAB — CBC WITH DIFFERENTIAL/PLATELET
Abs Immature Granulocytes: 0.31 10*3/uL — ABNORMAL HIGH (ref 0.00–0.07)
Basophils Absolute: 0.1 10*3/uL (ref 0.0–0.1)
Basophils Relative: 1 %
Eosinophils Absolute: 0.3 10*3/uL (ref 0.0–0.5)
Eosinophils Relative: 2 %
HCT: 33.7 % — ABNORMAL LOW (ref 39.0–52.0)
Hemoglobin: 11.3 g/dL — ABNORMAL LOW (ref 13.0–17.0)
Immature Granulocytes: 2 %
Lymphocytes Relative: 14 %
Lymphs Abs: 2.2 10*3/uL (ref 0.7–4.0)
MCH: 30.5 pg (ref 26.0–34.0)
MCHC: 33.5 g/dL (ref 30.0–36.0)
MCV: 91.1 fL (ref 80.0–100.0)
Monocytes Absolute: 1 10*3/uL (ref 0.1–1.0)
Monocytes Relative: 6 %
Neutro Abs: 11.5 10*3/uL — ABNORMAL HIGH (ref 1.7–7.7)
Neutrophils Relative %: 75 %
Platelets: 451 10*3/uL — ABNORMAL HIGH (ref 150–400)
RBC: 3.7 MIL/uL — ABNORMAL LOW (ref 4.22–5.81)
RDW: 16.6 % — ABNORMAL HIGH (ref 11.5–15.5)
WBC: 15.4 10*3/uL — ABNORMAL HIGH (ref 4.0–10.5)
nRBC: 0 % (ref 0.0–0.2)

## 2020-12-20 LAB — GLUCOSE, CAPILLARY
Glucose-Capillary: 158 mg/dL — ABNORMAL HIGH (ref 70–99)
Glucose-Capillary: 183 mg/dL — ABNORMAL HIGH (ref 70–99)
Glucose-Capillary: 148 mg/dL — ABNORMAL HIGH (ref 70–99)
Glucose-Capillary: 138 mg/dL — ABNORMAL HIGH (ref 70–99)
Glucose-Capillary: 134 mg/dL — ABNORMAL HIGH (ref 70–99)

## 2020-12-20 LAB — BASIC METABOLIC PANEL
Anion gap: 9 (ref 5–15)
BUN: 10 mg/dL (ref 8–23)
CO2: 27 mmol/L (ref 22–32)
Calcium: 8.3 mg/dL — ABNORMAL LOW (ref 8.9–10.3)
Chloride: 103 mmol/L (ref 98–111)
Creatinine, Ser: 1.1 mg/dL (ref 0.61–1.24)
GFR, Estimated: >60 mL/min (ref >60)
Glucose, Bld: 199 mg/dL — ABNORMAL HIGH (ref 70–99)
Potassium: 3.5 mmol/L (ref 3.5–5.1)
Sodium: 139 mmol/L (ref 135–145)

## 2020-12-20 MED ORDER — POTASSIUM CHLORIDE CRYS ER 20 MEQ PO TBCR
40.0000 meq | EXTENDED_RELEASE_TABLET | Freq: Once | ORAL | Status: DC
  Filled 2020-12-20: qty 2

## 2020-12-20 MED ORDER — LOPERAMIDE HCL 2 MG PO CAPS
2.0000 mg | ORAL_CAPSULE | ORAL | Status: DC | PRN
  Administered 2020-12-20 (×2): 2 mg via ORAL
  Filled 2020-12-20 (×2): qty 1

## 2020-12-20 MED ORDER — LOPERAMIDE HCL 2 MG PO CAPS
2.0000 mg | ORAL_CAPSULE | Freq: Once | ORAL | Status: AC
  Administered 2020-12-20: 2 mg via ORAL
  Filled 2020-12-20: qty 1

## 2020-12-20 MED ORDER — LOPERAMIDE HCL 2 MG PO CAPS
4.0000 mg | ORAL_CAPSULE | Freq: Once | ORAL | Status: AC
  Administered 2020-12-20: 4 mg via ORAL
  Filled 2020-12-20: qty 2

## 2020-12-20 MED ORDER — LIP MEDEX EX OINT
TOPICAL_OINTMENT | CUTANEOUS | Status: DC | PRN
  Filled 2020-12-20: qty 7

## 2020-12-20 NOTE — Progress Notes (Signed)
OT Cancellation Note  Patient Details Name: MINOR IDEN MRN: [id number] DOB: [birth year]   Cancelled Treatment:    Reason Eval/Treat Not Completed: OT screened, no needs identified, will sign off patient and nurse consulted. Patient is independent in room for ADLs and completing functional mobility with I. No OT needs identified at this time.   Jackelyn Poling OTR/L, [facility] Acute Rehabilitation Department Office# [phone] Pager# [phone]   12/20/2020, 2:22 PM

## 2020-12-20 NOTE — Progress Notes (Signed)
PROGRESS NOTE    Francisco Frank  RDE:[id number] DOB: 24-Jul-1950 DOA: 12/12/2020 PCP: Kennieth Rad, MD    Chief Complaint  Patient presents with   Abdominal Pain    Brief Narrative:  Francisco Frank is a 70 y.o. male with medical history significant for recently diagnosed pancreatic cancer complicated by biliary duct obstruction with abdominal pain, associated with nausea, vomiting for over a month. Imaging showed acute cholecystitis, he was started on IV antibiotics and IV Dilaudid.  Patient reports his abdominal pain has not improved despite antibiotics and IV Dilaudid.  General surgery and IR on board .  HIDA scan done , showed Absent gallbladder activity, consistent with cystic duct obstruction/acute cholecystitis. Plan for percutaneous cholecystostomy by IR.     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  Patient noted to have a leukocytosis and findings consistent with acute cholecystitis.  -Patient made n.p.o. initially general surgery consultation obtained as well as IR consultation. -Blood cultures negative x5 days. -Patient with some improvement with epigastric and upper abdominal pain.   -CT abdomen and pelvis done concerning for inflammation and stranding in the right upper quadrant surrounding the gallbladder. -Right upper quadrant ultrasound done with mild gallbladder wall thickening and pericholecystic fluid, given patient's pain located left  abdomen epigastric findings could be related to chronic cholecystitis better evaluated with HIDA scan.  1.1 cm nonshadowing echogenic structure within the proximal gallbladder lumen favored to be sludge given lack of significant into flow.  Follow-up ultrasound to be done in 6 months to document stability/resolution. -HIDA scan done with absent gallbladder activity, consistent with cystic duct obstruction/acute cholecystitis.  No evidence of biliary obstruction. -Patient seen by general surgery who recommended percutaneous cholecystostomy tube placement per IR. -Per general surgery hycosamine may have helped with some of his episodic type pain symptoms. -Leukocytosis fluctuating currently at 15.4.  -Status post percutaneous cholecystostomy tube placement 12/17/2020 with cultures pending.   -Tolerating current diet however with complaints of nausea over the past couple of days with some improvement on Compazine.   -Patient with some complaints of loose stools.   -Continue IV Zosyn pending urine culture results.   -Continue current pain management.   -Per general surgery.   2.  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 -General surgery to determine Port-A-Cath placement can be done just prior to discharge during this hospitalization though may need to be rescheduled in the outpatient setting. -Oncology, Dr. Benay Spice informed of admission and per patient he was seen by Dr. Benay Spice early this morning. -Creon resumed.   -Patient with some complaints of nausea yesterday and today with some improvement on IV Compazine.   -Compazine as needed.    3.  Insulin-dependent diabetes mellitus II -Hemoglobin A1c 9.6  (11/09/2020). -CBG 183 this morning.   -Continue Semglee 7 units daily, SSI.   -Outpatient follow-up with PCP.  4.   Hypomagnesemia/hypokalemia -Stable at 3.5.  Magnesium at 2.0.   -Repeat labs in the a.m.   5.  Coronary artery artery disease -Stable. -Was on Plavix and aspirin at home prior to admission. -Continue statin.   -Continue to hold Plavix due to concerns patient may need surgery and will defer resumption of Plavix timing to general surgery/IR.   -Follow.  6.  Anemia of chronic disease -Hemoglobin stable at 11.3 today. -Transfusion threshold hemoglobin < 7.  7.  AKI with mild metabolic acidosis -Likely secondary to problem #1 and prerenal azotemia. -renal function improved with hydration.   8.  Hypertension -BP stable.   -Continue to hold antihypertensive medications.  9.  Loose stools/diarrhea -Imodium as needed.   DVT prophylaxis: Lovenox Code Status: Full Family Communication: Updated patient.  No family at bedside. Disposition:   Status is: Inpatient  Remains inpatient appropriate because:Inpatient level of care appropriate due to severity of illness  Dispo: The patient is from: Home              Anticipated d/c is to: Home              Patient currently is not medically stable to d/c.   Difficult to place patient No       Consultants:  IR: Dr. Shelton Silvas 12/17/2020 General surgery Oncology  Procedures:  CT abdomen and pelvis 12/12/2020 Chest x-ray 12/12/2020 HIDA scan 12/16/2020 Percutaneous cholecystostomy tube placement IR 12/17/2020 Abdominal ultrasound 12/15/2020 IR cholangiogram 12/11/2020 per Dr. Laurence Ferrari    Antimicrobials:  IV Rocephin 9/23/ 2022x1 dose IV Flagyl 12/12/2020 x 1 dose IV Zosyn 12/12/2020>>>>>    Subjective: Laying in bed.  States not feeling too well today.  Denies any chest pain.  No shortness of breath.  Tolerating oral intake however stated made him nauseous today with no improvement on IV Zofran.  Denies any abdominal pain with oral intake.  No emesis.    Objective: Vitals:   12/19/20 1316 12/19/20 2127 12/20/20 0554 12/20/20 1345   BP: (!) 139/92 124/82 122/86 (!) 126/91  Pulse: 70 89 88 93  Resp: 17 17 18 16   Temp: 98 F (36.7 C) 98.8 F (37.1 C) (!) 97.5 F (36.4 C) 98.5 F (36.9 C)  TempSrc: Oral Oral Oral Oral  SpO2: 97% 96% 96% 99%  Weight:      Height:        Intake/Output Summary (Last 24 hours) at 12/20/2020 1634 Last data filed at 12/20/2020 1431 Gross per 24 hour  Intake 435 ml  Output 400 ml  Net 35 ml   Filed Weights   12/12/20 2000  Weight: 68.2 kg    Examination:  General exam: : NAD Respiratory system: CTA B.  No wheezes, no rhonchi.  Speaking in full sentences.  Normal respiratory effort. Cardiovascular system: Regular rate and rhythm no murmurs rubs or gallops.  No JVD.  No lower extremity edema.  Gastrointestinal system: Abdomen soft, nontender, nondistended, positive bowel sounds.  No rebound.  No guarding.  Percutaneous drain with bilious drainage.  Some tenderness to palpation around drain site. Central nervous system: Alert and oriented. No focal neurological deficits. Extremities: Symmetric 5 x 5 power. Skin: No rashes, lesions or ulcers Psychiatry: Judgement and insight appear normal. Mood & affect appropriate.  Data Reviewed: I have personally reviewed following labs and imaging studies  CBC: Recent Labs  Lab 12/16/20 0559 12/17/20 1013 12/18/20 0511 12/19/20 0557 12/20/20 0623  WBC 16.1* 16.2* 17.5* 12.7* 15.4*  NEUTROABS 9.7* 10.4*  11.4* 8.8* 11.5*  HGB 9.8* 9.9* 9.8* 9.7* 11.3*  HCT 29.7* 29.4* 29.9* 29.6* 33.7*  MCV 93.1 91.6 92.9 91.4 91.1  PLT 269 319 355 349 451*    Basic Metabolic Panel: Recent Labs  Lab 12/16/20 0559 12/17/20 1013 12/18/20 0511 12/19/20 0557 12/20/20 0623  NA 139 137 136 137 139  K 4.1 3.7 4.3 3.7 3.5  CL 108 104 106 105 103  CO2 23 26 22 26 27   GLUCOSE 105* 87 154* 171* 199*  BUN 12 10 10 10 10   CREATININE 0.91 1.04 1.03 0.97 1.10  CALCIUM 8.2* 8.0* 7.9* 7.8* 8.3*  MG  --  1.7 2.1 2.0  --     GFR: Estimated Creatinine  Clearance: 59.4 mL/min (by C-G formula based on SCr of 1.1 mg/dL).  Liver Function Tests: Recent Labs  Lab 12/14/20 0645 12/16/20 0559 12/17/20 1013 12/18/20 0511 12/19/20 0557  AST 37 34 34 38 31  ALT 35 30 28 29 25   ALKPHOS 98 99 109 117 102  BILITOT 2.7* 2.2* 2.3* 2.3* 1.7*  PROT 5.5* 5.2* 5.9* 5.9* 5.7*  ALBUMIN 2.1* 2.1* 2.1* 2.4* 2.1*    CBG: Recent Labs  Lab 12/19/20 1637 12/19/20 2129 12/20/20 0326 12/20/20 0751 12/20/20 1209  GLUCAP 120* 161* 158* 183* 148*     Recent Results (from the past 240 hour(s))  Blood culture (routine x 2)     Status: None   Collection Time: 12/12/20  6:55 AM   Specimen: BLOOD RIGHT HAND  Result Value Ref Range Status   Specimen Description   Final    BLOOD RIGHT HAND Performed at [facility], [place] [address], [place], [place] [address]    Special Requests   Final    BOTTLES DRAWN AEROBIC AND ANAEROBIC Blood Culture adequate volume Performed at [facility] [address], [place], [place] [address]    Culture   Final    NO GROWTH 5 DAYS Performed at [facility], [place] [address], [place], [place] [address]    Report Status 12/17/2020 FINAL  Final  Blood culture (routine x 2)     Status: None   Collection Time: 12/12/20  7:44 AM   Specimen: BLOOD  Result Value Ref Range Status   Specimen Description   Final    BLOOD LEFT ANTECUBITAL Performed at [facility] [address] [place], [place] [address]    Special Requests   Final    BOTTLES DRAWN AEROBIC AND ANAEROBIC Blood Culture results may not be optimal due to an excessive volume of blood received in culture bottles Performed at [facility] [address] [place], [place] [address]    Culture   Final    NO GROWTH 5 DAYS Performed at [facility], [place] [address] [place], [place] [address]    Report Status 12/17/2020 FINAL  Final  Resp Panel by RT-PCR (Flu A&B, Covid)  Nasopharyngeal Swab     Status: None   Collection Time: 12/12/20 10:24 AM   Specimen: Nasopharyngeal Swab; Nasopharyngeal(NP) swabs in vial transport medium  Result Value Ref Range Status   SARS Coronavirus 2 by RT PCR NEGATIVE NEGATIVE Final    Comment: (NOTE) SARS-CoV-2 target nucleic acids are NOT DETECTED.  The SARS-CoV-2 RNA is generally detectable in upper respiratory specimens during the acute phase of infection. The lowest concentration of SARS-CoV-2 viral copies this assay can detect is 138 copies/mL. A negative result does not preclude  SARS-Cov-2 infection and should not be used as the sole basis for treatment or other patient management decisions. A negative result may occur with  improper specimen collection/handling, submission of specimen other than nasopharyngeal swab, presence of viral mutation(s) within the areas targeted by this assay, and inadequate number of viral copies(<138 copies/mL). A negative result must be combined with clinical observations, patient history, and epidemiological information. The expected result is Negative.  Fact Sheet for Patients:  EntrepreneurPulse.com.au  Fact Sheet for Healthcare Providers:  IncredibleEmployment.be  This test is no t yet approved or cleared by the [place] FDA and  has been authorized for detection and/or diagnosis of SARS-CoV-2 by FDA under an Emergency Use Authorization (EUA). This EUA will remain  in effect (meaning this test can be used) for the duration of the COVID-19 declaration under Section 564(b)(1) of the Act, 21 U.S.C.section 360bbb-3(b)(1), unless the authorization is terminated  or revoked sooner.       Influenza A by PCR NEGATIVE NEGATIVE Final   Influenza B by PCR NEGATIVE NEGATIVE Final    Comment: (NOTE) The Xpert Xpress SARS-CoV-2/FLU/RSV plus assay is intended as an aid in the diagnosis of influenza from Nasopharyngeal swab specimens and should not be  used as a sole basis for treatment. Nasal washings and aspirates are unacceptable for Xpert Xpress SARS-CoV-2/FLU/RSV testing.  Fact Sheet for Patients: EntrepreneurPulse.com.au  Fact Sheet for Healthcare Providers: IncredibleEmployment.be  This test is not yet approved or cleared by the [place] FDA and has been authorized for detection and/or diagnosis of SARS-CoV-2 by FDA under an Emergency Use Authorization (EUA). This EUA will remain in effect (meaning this test can be used) for the duration of the COVID-19 declaration under Section 564(b)(1) of the Act, 21 U.S.C. section 360bbb-3(b)(1), unless the authorization is terminated or revoked.  Performed at [facility], [place] [address], [place], [place] [address]           Radiology Studies: No results found.      Scheduled Meds:  (feeding supplement) PROSource Plus  30 mL Oral TID BM   acetaminophen  1,000 mg Oral Q6H   atorvastatin  40 mg Oral Daily   celecoxib  200 mg Oral Once   enoxaparin (LOVENOX) injection  40 mg Subcutaneous Q24H   feeding supplement  237 mL Oral BID BM   hyoscyamine  0.375 mg Oral Q12H   insulin aspart  0-15 Units Subcutaneous TID WC   insulin glargine-yfgn  7 Units Subcutaneous QHS   lipase/protease/amylase  12,000 Units Oral TID with meals   multivitamin with minerals  1 tablet Oral Daily   potassium chloride  40 mEq Oral Once   sodium chloride flush  5 mL Intracatheter Q8H   Continuous Infusions:  piperacillin-tazobactam (ZOSYN)  IV 3.375 g (12/20/20 1420)     LOS: 8 days    Time spent: 35 minutes    Irine Seal, MD [facility]   To contact the attending provider between 7A-7P or the covering provider during after hours 7P-7A, please log into the web site [email or web address] and access using universal [place] password for that web site. If you do not have the password, please call the hospital  operator.  12/20/2020, 4:34 PM

## 2020-12-20 NOTE — Plan of Care (Signed)

## 2020-12-20 NOTE — Progress Notes (Signed)
PT Cancellation Note  Patient Details Name: Francisco Frank MRN: [id number] DOB: [birth year]   Cancelled Treatment:    Reason Eval/Treat Not Completed: PT screened, no needs identified, will sign off (Pt mobilizing to bathroom and in room independently, reports has ambulated in hallway with no device. slightly less endurance but no difficluty. Educated on benefits of OPPT, no skilled acute PT needs. PT will sign off at this time.) Please re-consult if there is a change in functional mobility status.   Verner Mould, DPT Acute Rehabilitation Services Office [phone] Pager [phone]

## 2020-12-20 NOTE — Progress Notes (Signed)
   Subjective/Chief Complaint: None Patient resting in bed.  No complaints.   Objective: Vital signs in last 24 hours: Temp:  [97.5 F (36.4 C)-98.8 F (37.1 C)] 97.5 F (36.4 C) (10/01 0554) Pulse Rate:  [70-89] 88 (10/01 0554) Resp:  [17-18] 18 (10/01 0554) BP: (122-139)/(82-92) 122/86 (10/01 0554) SpO2:  [96 %-97 %] 96 % (10/01 0554) Last BM Date: 12/19/20  Intake/Output from previous day: 09/30 0701 - 10/01 0700 In: 550 [P.O.:240; IV Piggyback:300] Out: 660 [Urine:600; Drains:60] Intake/Output this shift: No intake/output data recorded.   Heart: regular Lungs: CTAB Abd: soft, mildly TTP around drain as expected and drain with bilious output, ND, +BS MSK: calves soft without edema or TTP bilaterally Psych: A&O x3   Lab Results:  Recent Labs    12/19/20 0557 12/20/20 0623  WBC 12.7* 15.4*  HGB 9.7* 11.3*  HCT 29.6* 33.7*  PLT 349 451*   BMET Recent Labs    12/19/20 0557 12/20/20 0623  NA 137 139  K 3.7 3.5  CL 105 103  CO2 26 27  GLUCOSE 171* 199*  BUN 10 10  CREATININE 0.97 1.10  CALCIUM 7.8* 8.3*   PT/INR No results for input(s): LABPROT, INR in the last 72 hours. ABG No results for input(s): PHART, HCO3 in the last 72 hours.  Invalid input(s): PCO2, PO2  Studies/Results: No results found.  Anti-infectives: Anti-infectives (From admission, onward)    Start     Dose/Rate Route Frequency Ordered Stop   12/12/20 1800  metroNIDAZOLE (FLAGYL) IVPB 500 mg  Status:  Discontinued        500 mg 100 mL/hr over 60 Minutes Intravenous Every 8 hours 12/12/20 1214 12/12/20 1214   12/12/20 1400  piperacillin-tazobactam (ZOSYN) IVPB 3.375 g        3.375 g 12.5 mL/hr over 240 Minutes Intravenous Every 8 hours 12/12/20 1142     12/12/20 1300  ceFEPIme (MAXIPIME) 2 g in sodium chloride 0.9 % 100 mL IVPB  Status:  Discontinued        2 g 200 mL/hr over 30 Minutes Intravenous Every 12 hours 12/12/20 1138 12/12/20 1138   12/12/20 1000  cefTRIAXone  (ROCEPHIN) 2 g in sodium chloride 0.9 % 100 mL IVPB        2 g 200 mL/hr over 30 Minutes Intravenous  Once 12/12/20 0951 12/12/20 1024   12/12/20 1000  metroNIDAZOLE (FLAGYL) IVPB 500 mg        500 mg 100 mL/hr over 60 Minutes Intravenous  Once 12/12/20 0951 12/12/20 1130       Assessment/Plan:  Pancreatic head cancer with obstructed CBD, s/p stenting and cholecystitis, s/p perc chole drain placement -cont abx therapy for cholecystitis - pending perc culture results -IR perc chole drain 9/28. Pain has improved today though now with nausea -hyoscyamine did help with some of his episodic type pain symptoms from previously as well.  Cont this for now. - WBC 15.1K -port placement prior to DC pending WBC and timing by Dr. Zenia Resides.  If unable will reschedule as outpatient.   FEN - regular, IVFs VTE - Lovenox ID - zosyn   DM HTN HLD PCM - albumin is 2.1 AKI - cr 1.1       LOS: 8 days    Turner Daniels MD  12/20/2020

## 2020-12-21 DIAGNOSIS — I2510 Atherosclerotic heart disease of native coronary artery without angina pectoris: Secondary | ICD-10-CM | POA: Diagnosis not present

## 2020-12-21 DIAGNOSIS — C250 Malignant neoplasm of head of pancreas: Secondary | ICD-10-CM | POA: Diagnosis not present

## 2020-12-21 DIAGNOSIS — K810 Acute cholecystitis: Secondary | ICD-10-CM | POA: Diagnosis not present

## 2020-12-21 DIAGNOSIS — I10 Essential (primary) hypertension: Secondary | ICD-10-CM | POA: Diagnosis not present

## 2020-12-21 LAB — GLUCOSE, CAPILLARY
Glucose-Capillary: 119 mg/dL — ABNORMAL HIGH (ref 70–99)
Glucose-Capillary: 114 mg/dL — ABNORMAL HIGH (ref 70–99)
Glucose-Capillary: 170 mg/dL — ABNORMAL HIGH (ref 70–99)
Glucose-Capillary: 138 mg/dL — ABNORMAL HIGH (ref 70–99)

## 2020-12-21 LAB — BASIC METABOLIC PANEL
Anion gap: 10 (ref 5–15)
BUN: 9 mg/dL (ref 8–23)
CO2: 25 mmol/L (ref 22–32)
Calcium: 8.3 mg/dL — ABNORMAL LOW (ref 8.9–10.3)
Chloride: 105 mmol/L (ref 98–111)
Creatinine, Ser: 1.01 mg/dL (ref 0.61–1.24)
GFR, Estimated: >60 mL/min (ref >60)
Glucose, Bld: 119 mg/dL — ABNORMAL HIGH (ref 70–99)
Potassium: 3.4 mmol/L — ABNORMAL LOW (ref 3.5–5.1)
Sodium: 140 mmol/L (ref 135–145)

## 2020-12-21 LAB — CBC WITH DIFFERENTIAL/PLATELET
Abs Immature Granulocytes: 0.23 10*3/uL — ABNORMAL HIGH (ref 0.00–0.07)
Basophils Absolute: 0.2 10*3/uL — ABNORMAL HIGH (ref 0.0–0.1)
Basophils Relative: 1 %
Eosinophils Absolute: 0.4 10*3/uL (ref 0.0–0.5)
Eosinophils Relative: 3 %
HCT: 31.9 % — ABNORMAL LOW (ref 39.0–52.0)
Hemoglobin: 10.4 g/dL — ABNORMAL LOW (ref 13.0–17.0)
Immature Granulocytes: 2 %
Lymphocytes Relative: 20 %
Lymphs Abs: 3 10*3/uL (ref 0.7–4.0)
MCH: 29.8 pg (ref 26.0–34.0)
MCHC: 32.6 g/dL (ref 30.0–36.0)
MCV: 91.4 fL (ref 80.0–100.0)
Monocytes Absolute: 1.1 10*3/uL — ABNORMAL HIGH (ref 0.1–1.0)
Monocytes Relative: 7 %
Neutro Abs: 10.3 10*3/uL — ABNORMAL HIGH (ref 1.7–7.7)
Neutrophils Relative %: 67 %
Platelets: 491 10*3/uL — ABNORMAL HIGH (ref 150–400)
RBC: 3.49 MIL/uL — ABNORMAL LOW (ref 4.22–5.81)
RDW: 16.6 % — ABNORMAL HIGH (ref 11.5–15.5)
WBC: 15.2 10*3/uL — ABNORMAL HIGH (ref 4.0–10.5)
nRBC: 0 % (ref 0.0–0.2)

## 2020-12-21 LAB — MAGNESIUM: Magnesium: 1.8 mg/dL (ref 1.7–2.4)

## 2020-12-21 MED ORDER — MAGNESIUM SULFATE 2 GM/50ML IV SOLN
2.0000 g | Freq: Once | INTRAVENOUS | Status: AC
  Administered 2020-12-21: 2 g via INTRAVENOUS
  Filled 2020-12-21: qty 50

## 2020-12-21 MED ORDER — INSULIN GLARGINE-YFGN 100 UNIT/ML ~~LOC~~ SOLN
7.0000 [IU] | Freq: Every day | SUBCUTANEOUS | Status: DC
  Administered 2020-12-22: 7 [IU] via SUBCUTANEOUS
  Filled 2020-12-21: qty 0.07

## 2020-12-21 MED ORDER — POTASSIUM CHLORIDE 10 MEQ/100ML IV SOLN
10.0000 meq | INTRAVENOUS | Status: AC
  Administered 2020-12-21 (×4): 10 meq via INTRAVENOUS
  Filled 2020-12-21 (×4): qty 100

## 2020-12-21 NOTE — Progress Notes (Signed)
PROGRESS NOTE    Francisco Frank  NFA:[id number] DOB: 21-Feb-1951 DOA: 12/12/2020 PCP: Kennieth Rad, MD    Chief Complaint  Patient presents with   Abdominal Pain    Brief Narrative:  Francisco Frank is a 70 y.o. male with medical history significant for recently diagnosed pancreatic cancer complicated by biliary duct obstruction with abdominal pain, associated with nausea, vomiting for over a month. Imaging showed acute cholecystitis, he was started on IV antibiotics and IV Dilaudid.  Patient reports his abdominal pain has not improved despite antibiotics and IV Dilaudid.  General surgery and IR on board .  HIDA scan done , showed Absent gallbladder activity, consistent with cystic duct obstruction/acute cholecystitis. Plan for percutaneous cholecystostomy by IR.     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  Patient noted to have a leukocytosis and findings consistent with acute cholecystitis.  -Patient made n.p.o. initially general surgery consultation obtained as well as IR consultation. -Blood cultures negative x5 days. -Patient with some improvement with epigastric and upper abdominal pain post percutaneous drain placement..   -CT abdomen and pelvis done concerning for inflammation and stranding in the right upper quadrant surrounding the gallbladder. -Right upper quadrant ultrasound done with mild gallbladder wall thickening and pericholecystic fluid,  given patient's pain located left abdomen epigastric findings could be related to chronic cholecystitis better evaluated with HIDA scan.  1.1 cm nonshadowing echogenic structure within the proximal gallbladder lumen favored to be sludge given lack of significant into flow.  Follow-up ultrasound to be done in 6 months to document stability/resolution. -HIDA scan done with absent gallbladder activity, consistent with cystic duct obstruction/acute cholecystitis.  No evidence of biliary obstruction. -Patient seen by general surgery who recommended percutaneous cholecystostomy tube placement per IR. -Per general surgery hycosamine may have helped with some of his episodic type pain symptoms. -Leukocytosis fluctuating currently at 15.2. -Status post percutaneous cholecystostomy tube placement 12/17/2020 with cultures pending.   -Tolerating current diet however with complaints of nausea over the past couple of days with some improvement on Compazine.   -Patient with some complaints of loose stools which have improved on Imodium as needed..   -Continue IV Zosyn pending drain culture results.   -Continue current pain management.   -Per general surgery.   2.  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 -General surgery to determine Port-A-Cath placement can be done just prior to discharge during this hospitalization though may need to be rescheduled in the outpatient setting. -Oncology, Dr. Benay Spice informed of admission and per patient he was seen by Dr. Benay Spice early this morning. -Creon resumed.   -Patient with some complaints of nausea which are slowly improving on IV Compazine however still endorses nausea today.   -Continue Compazine as needed.  3.  Insulin-dependent diabetes mellitus II -Hemoglobin A1c 9.6  (11/09/2020). -CBG 114 this morning. -Hold  nighttime  dose Semglee as patient for potential procedure tomorrow.   -SSI.   -Outpatient follow-up with PCP.   4.  Hypomagnesemia/hypokalemia -Potassium at 3.4, magnesium at 1.8.   -Replete potassium.    5.  Coronary artery artery disease -Stable. -Was on Plavix and aspirin at home prior to admission. -Continue statin.   -Continue to hold Plavix due to concerns patient may need surgery and will defer resumption of Plavix timing to general surgery/IR.   -Follow.  6.  Anemia of chronic disease -Hemoglobin stable at 10.4.  -Transfusion threshold hemoglobin < 7.  7.  AKI with mild metabolic acidosis -Likely secondary to problem #1 and prerenal azotemia. -renal function improved with hydration.   8.  Hypertension -BP stable.   -Antihypertensive medications on hold.   9.  Loose stools/diarrhea -Imodium as needed.   DVT prophylaxis: Lovenox Code Status: Full Family Communication: Updated patient.  No family at bedside. Disposition:   Status is: Inpatient  Remains inpatient appropriate because:Inpatient level of care appropriate due to severity of illness  Dispo: The patient is from: Home              Anticipated d/c is to: Home when cleared by general surgery.              Patient currently is not medically stable to d/c.   Difficult to place patient No       Consultants:  IR: Dr. Annamaria Boots 12/17/2020 General surgery Oncology  Procedures:  CT abdomen and pelvis 12/12/2020 Chest x-ray 12/12/2020 HIDA scan 12/16/2020 Percutaneous cholecystostomy tube placement IR 12/17/2020 Abdominal ultrasound 12/15/2020 IR cholangiogram 12/11/2020 per Dr. Laurence Ferrari    Antimicrobials:  IV Rocephin 9/23/ 2022x1 dose IV Flagyl 12/12/2020 x 1 dose IV Zosyn 12/12/2020>>>>>    Subjective: Sitting up in bed eating a [employer] sandwich.  Does endorse some nausea.  Denies any abdominal pain with oral intake.  Feels epigastric abdominal pain has improved significantly since admission.  No  chest pain.  No shortness of breath.   Objective: Vitals:   12/20/20 0554 12/20/20 1345 12/20/20 2125 12/21/20 0558  BP: 122/86 (!) 126/91 122/79 (!) 134/96  Pulse: 88 93 84 85  Resp: 18 16 18 18   Temp: (!) 97.5 F (36.4 C) 98.5 F (36.9 C) 98.9 F (37.2 C) 98.8 F (37.1 C)  TempSrc: Oral Oral Oral Oral  SpO2: 96% 99% 97% 96%  Weight:      Height:        Intake/Output Summary (Last 24 hours) at 12/21/2020 1309 Last data filed at 12/21/2020 1017 Gross per 24 hour  Intake 375 ml  Output --  Net 375 ml    Filed Weights   12/12/20 2000  Weight: 68.2 kg    Examination:  General exam: : NAD Respiratory system: Lungs clear to auscultation bilaterally.  No wheezes, no crackles, no rhonchi.  Speaking in full sentences.  Normal respiratory effort.  Cardiovascular system: RRR no murmurs rubs or gallops.  No JVD.  No lower extremity edema.  Gastrointestinal system: Abdomen is soft, nontender, nondistended, positive bowel sounds.  Percutaneous drain with bilious drainage noted.  Some tenderness to palpation around drain site.  Central nervous system: Alert and oriented. No focal neurological deficits. Extremities: Symmetric 5 x 5 power. Skin: No rashes, lesions or ulcers Psychiatry: Judgement and insight appear normal. Mood & affect appropriate.  Data Reviewed: I have personally reviewed following labs and imaging studies  CBC: Recent Labs  Lab 12/17/20 1013 12/18/20 0511 12/19/20 0557 12/20/20 0623 12/21/20  0556  WBC 16.2* 17.5* 12.7* 15.4* 15.2*  NEUTROABS 10.4* 11.4* 8.8* 11.5* 10.3*  HGB 9.9* 9.8* 9.7* 11.3* 10.4*  HCT 29.4* 29.9* 29.6* 33.7* 31.9*  MCV 91.6 92.9 91.4 91.1 91.4  PLT 319 355 349 451* 491*     Basic Metabolic Panel: Recent Labs  Lab 12/17/20 1013 12/18/20 0511 12/19/20 0557 12/20/20 0623 12/21/20 0556  NA 137 136 137 139 140  K 3.7 4.3 3.7 3.5 3.4*  CL 104 106 105 103 105  CO2 26 22 26 27 25   GLUCOSE 87 154* 171* 199* 119*  BUN 10 10 10  10 9   CREATININE 1.04 1.03 0.97 1.10 1.01  CALCIUM 8.0* 7.9* 7.8* 8.3* 8.3*  MG 1.7 2.1 2.0  --  1.8     GFR: Estimated Creatinine Clearance: 64.7 mL/min (by C-G formula based on SCr of 1.01 mg/dL).  Liver Function Tests: Recent Labs  Lab 12/16/20 0559 12/17/20 1013 12/18/20 0511 12/19/20 0557  AST 34 34 38 31  ALT 30 28 29 25   ALKPHOS 99 109 117 102  BILITOT 2.2* 2.3* 2.3* 1.7*  PROT 5.2* 5.9* 5.9* 5.7*  ALBUMIN 2.1* 2.1* 2.4* 2.1*     CBG: Recent Labs  Lab 12/20/20 1209 12/20/20 1728 12/20/20 2129 12/21/20 0254 12/21/20 0757  GLUCAP 148* 138* 134* 119* 114*      Recent Results (from the past 240 hour(s))  Blood culture (routine x 2)     Status: None   Collection Time: 12/12/20  6:55 AM   Specimen: BLOOD RIGHT HAND  Result Value Ref Range Status   Specimen Description   Final    BLOOD RIGHT HAND Performed at [facility], [place] [address] [place], [place] [address]    Special Requests   Final    BOTTLES DRAWN AEROBIC AND ANAEROBIC Blood Culture adequate volume Performed at [place] [address] [place], [place] [address]    Culture   Final    NO GROWTH 5 DAYS Performed at [facility], [place] [address] [place], [place] [address]    Report Status 12/17/2020 FINAL  Final  Blood culture (routine x 2)     Status: None   Collection Time: 12/12/20  7:44 AM   Specimen: BLOOD  Result Value Ref Range Status   Specimen Description   Final    BLOOD LEFT ANTECUBITAL Performed at [place] [address] [place], [place] [address]    Special Requests   Final    BOTTLES DRAWN AEROBIC AND ANAEROBIC Blood Culture results may not be optimal due to an excessive volume of blood received in culture bottles Performed at [place] [address], [place], [place] [address]    Culture   Final    NO GROWTH 5 DAYS Performed at [facility], [place] [address],  [place], [place] [address]    Report Status 12/17/2020 FINAL  Final  Resp Panel by RT-PCR (Flu A&B, Covid) Nasopharyngeal Swab     Status: None   Collection Time: 12/12/20 10:24 AM   Specimen: Nasopharyngeal Swab; Nasopharyngeal(NP) swabs in vial transport medium  Result Value Ref Range Status   SARS Coronavirus 2 by RT PCR NEGATIVE NEGATIVE Final    Comment: (NOTE) SARS-CoV-2 target nucleic acids are NOT DETECTED.  The SARS-CoV-2 RNA is generally detectable in upper respiratory specimens during the acute phase of infection. The lowest concentration of SARS-CoV-2 viral copies this assay can detect is 138 copies/mL.  A negative result does not preclude SARS-Cov-2 infection and should not be used as the sole basis for treatment or other patient management decisions. A negative result may occur with  improper specimen collection/handling, submission of specimen other than nasopharyngeal swab, presence of viral mutation(s) within the areas targeted by this assay, and inadequate number of viral copies(<138 copies/mL). A negative result must be combined with clinical observations, patient history, and epidemiological information. The expected result is Negative.  Fact Sheet for Patients:  EntrepreneurPulse.com.au  Fact Sheet for Healthcare Providers:  IncredibleEmployment.be  This test is no t yet approved or cleared by the [place] FDA and  has been authorized for detection and/or diagnosis of SARS-CoV-2 by FDA under an Emergency Use Authorization (EUA). This EUA will remain  in effect (meaning this test can be used) for the duration of the COVID-19 declaration under Section 564(b)(1) of the Act, 21 U.S.C.section 360bbb-3(b)(1), unless the authorization is terminated  or revoked sooner.       Influenza A by PCR NEGATIVE NEGATIVE Final   Influenza B by PCR NEGATIVE NEGATIVE Final    Comment: (NOTE) The Xpert Xpress SARS-CoV-2/FLU/RSV plus assay is  intended as an aid in the diagnosis of influenza from Nasopharyngeal swab specimens and should not be used as a sole basis for treatment. Nasal washings and aspirates are unacceptable for Xpert Xpress SARS-CoV-2/FLU/RSV testing.  Fact Sheet for Patients: EntrepreneurPulse.com.au  Fact Sheet for Healthcare Providers: IncredibleEmployment.be  This test is not yet approved or cleared by the [place] FDA and has been authorized for detection and/or diagnosis of SARS-CoV-2 by FDA under an Emergency Use Authorization (EUA). This EUA will remain in effect (meaning this test can be used) for the duration of the COVID-19 declaration under Section 564(b)(1) of the Act, 21 U.S.C. section 360bbb-3(b)(1), unless the authorization is terminated or revoked.  Performed at [facility], [place] [address], [place], [place] [address]           Radiology Studies: No results found.      Scheduled Meds:  (feeding supplement) PROSource Plus  30 mL Oral TID BM   acetaminophen  1,000 mg Oral Q6H   atorvastatin  40 mg Oral Daily   celecoxib  200 mg Oral Once   enoxaparin (LOVENOX) injection  40 mg Subcutaneous Q24H   feeding supplement  237 mL Oral BID BM   hyoscyamine  0.375 mg Oral Q12H   insulin aspart  0-15 Units Subcutaneous TID WC   insulin glargine-yfgn  7 Units Subcutaneous QHS   lipase/protease/amylase  12,000 Units Oral TID with meals   multivitamin with minerals  1 tablet Oral Daily   sodium chloride flush  5 mL Intracatheter Q8H   Continuous Infusions:  piperacillin-tazobactam (ZOSYN)  IV 3.375 g (12/21/20 0510)   potassium chloride 10 mEq (12/21/20 1124)     LOS: 9 days    Time spent: 35 minutes    Irine Seal, MD [facility]   To contact the attending provider between 7A-7P or the covering provider during after hours 7P-7A, please log into the web site [email or web address] and access using universal  [place] password for that web site. If you do not have the password, please call the hospital operator.  12/21/2020, 1:09 PM

## 2020-12-22 ENCOUNTER — Encounter: Payer: Self-pay | Admitting: *Deleted

## 2020-12-22 DIAGNOSIS — I10 Essential (primary) hypertension: Secondary | ICD-10-CM | POA: Diagnosis not present

## 2020-12-22 DIAGNOSIS — C250 Malignant neoplasm of head of pancreas: Secondary | ICD-10-CM | POA: Diagnosis not present

## 2020-12-22 DIAGNOSIS — K810 Acute cholecystitis: Secondary | ICD-10-CM | POA: Diagnosis not present

## 2020-12-22 DIAGNOSIS — I2510 Atherosclerotic heart disease of native coronary artery without angina pectoris: Secondary | ICD-10-CM | POA: Diagnosis not present

## 2020-12-22 LAB — BASIC METABOLIC PANEL
Anion gap: 3 — ABNORMAL LOW (ref 5–15)
BUN: 11 mg/dL (ref 8–23)
CO2: 25 mmol/L (ref 22–32)
Calcium: 7.9 mg/dL — ABNORMAL LOW (ref 8.9–10.3)
Chloride: 106 mmol/L (ref 98–111)
Creatinine, Ser: 0.93 mg/dL (ref 0.61–1.24)
GFR, Estimated: >60 mL/min (ref >60)
Glucose, Bld: 160 mg/dL — ABNORMAL HIGH (ref 70–99)
Potassium: 3.9 mmol/L (ref 3.5–5.1)
Sodium: 134 mmol/L — ABNORMAL LOW (ref 135–145)

## 2020-12-22 LAB — CBC
HCT: 30.8 % — ABNORMAL LOW (ref 39.0–52.0)
Hemoglobin: 10.2 g/dL — ABNORMAL LOW (ref 13.0–17.0)
MCH: 30.1 pg (ref 26.0–34.0)
MCHC: 33.1 g/dL (ref 30.0–36.0)
MCV: 90.9 fL (ref 80.0–100.0)
Platelets: 481 10*3/uL — ABNORMAL HIGH (ref 150–400)
RBC: 3.39 MIL/uL — ABNORMAL LOW (ref 4.22–5.81)
RDW: 16.5 % — ABNORMAL HIGH (ref 11.5–15.5)
WBC: 13.4 10*3/uL — ABNORMAL HIGH (ref 4.0–10.5)
nRBC: 0 % (ref 0.0–0.2)

## 2020-12-22 LAB — GLUCOSE, CAPILLARY
Glucose-Capillary: 117 mg/dL — ABNORMAL HIGH (ref 70–99)
Glucose-Capillary: 188 mg/dL — ABNORMAL HIGH (ref 70–99)
Glucose-Capillary: 150 mg/dL — ABNORMAL HIGH (ref 70–99)
Glucose-Capillary: 103 mg/dL — ABNORMAL HIGH (ref 70–99)
Glucose-Capillary: 153 mg/dL — ABNORMAL HIGH (ref 70–99)

## 2020-12-22 LAB — MAGNESIUM: Magnesium: 2.3 mg/dL (ref 1.7–2.4)

## 2020-12-22 NOTE — Progress Notes (Signed)
PROGRESS NOTE    [name]  IHK:[id number] DOB: 02/25/1951 DOA: 12/12/2020 PCP: Kennieth Rad, MD    Chief Complaint  Patient presents with   Abdominal Pain    Brief Narrative:  Francisco Frank is a 70 y.o. male with medical history significant for recently diagnosed pancreatic cancer complicated by biliary duct obstruction with abdominal pain, associated with nausea, vomiting for over a month. Imaging showed acute cholecystitis, he was started on IV antibiotics and IV Dilaudid.  Patient reports his abdominal pain has not improved despite antibiotics and IV Dilaudid.  General surgery and IR on board .  HIDA scan done , showed Absent gallbladder activity, consistent with cystic duct obstruction/acute cholecystitis. Plan for percutaneous cholecystostomy by IR.     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  Patient noted to have a leukocytosis and findings consistent with acute cholecystitis.  -Patient made n.p.o. initially general surgery consultation obtained as well as IR consultation. -Blood cultures negative x5 days. -Patient with some improvement with epigastric and upper abdominal pain post percutaneous drain placement..   -CT abdomen and pelvis done concerning for inflammation and stranding in the right upper quadrant surrounding the gallbladder. -Right upper quadrant ultrasound done with mild gallbladder wall thickening and pericholecystic fluid,  given patient's pain located left abdomen epigastric findings could be related to chronic cholecystitis better evaluated with HIDA scan.  1.1 cm nonshadowing echogenic structure within the proximal gallbladder lumen favored to be sludge given lack of significant into flow.  Follow-up ultrasound to be done in 6 months to document stability/resolution. -HIDA scan done with absent gallbladder activity, consistent with cystic duct obstruction/acute cholecystitis.  No evidence of biliary obstruction. -Patient seen by general surgery who recommended percutaneous cholecystostomy tube placement per IR. -Per general surgery hycosamine may have helped with some of his episodic type pain symptoms. -Leukocytosis fluctuating currently at 15.2. -Status post percutaneous cholecystostomy tube placement 12/17/2020 with cultures pending.   -Tolerating current diet however with complaints of nausea over the past couple of days with some improvement on Compazine.   -Patient with some complaints of loose stools which have improved on Imodium as needed..   -Continue IV Zosyn pending drain culture results.   -Continue current pain management.   -Per general surgery.   2.  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 -General surgery to determine Port-A-Cath placement can be done just prior to discharge during this hospitalization though may need to be rescheduled in the outpatient setting. -Oncology, Dr. Benay Spice informed of admission and per patient he was seen by Dr. Benay Spice early this morning. -Creon resumed.   -Patient with some complaints of nausea which are slowly improving on IV Compazine however still endorses nausea today.   -Continue Compazine as needed.  3.  Insulin-dependent diabetes mellitus II -Hemoglobin A1c 9.6  (11/09/2020). -CBG 150 this  morning. -Semglee resumed.   -  SSI.   4.  Hypomagnesemia/hypokalemia -Potassium at 3.9.  Magnesium at 2.3.  5.  Coronary artery artery disease -Stable. -Was on Plavix and aspirin at home prior to admission. -Continue statin.   -Continue to hold Plavix due to concerns patient may need surgery and will defer resumption of Plavix timing to general surgery/IR.   -Follow.  6.  Anemia of chronic disease -Hemoglobin stable at 10.2 -Transfusion threshold hemoglobin < 7.  7.  AKI with mild metabolic acidosis -Likely secondary to problem #1 and prerenal azotemia. -renal function improved with hydration.   8.  Hypertension -BP stable.   -Antihypertensive medications on hold.  -Outpatient follow-up with PCP.  9.  Loose stools/diarrhea -Imodium as needed.   DVT prophylaxis: Lovenox Code Status: Full Family Communication: Updated patient and wife at bedside..  Disposition:   Status is: Inpatient  Remains inpatient appropriate because:Inpatient level of care appropriate due to severity of illness  Dispo: The patient is from: Home              Anticipated d/c is to: Home when cleared by general surgery.              Patient currently is not medically stable to d/c.   Difficult to place patient No       Consultants:  IR: Dr. Annamaria Boots 12/17/2020 General surgery Oncology  Procedures:  CT abdomen and pelvis 12/12/2020 Chest x-ray 12/12/2020 HIDA scan 12/16/2020 Percutaneous cholecystostomy tube placement IR 12/17/2020 Abdominal ultrasound 12/15/2020 IR cholangiogram 12/11/2020 per Dr. Laurence Ferrari    Antimicrobials:  IV Rocephin 9/23/ 2022x1 dose IV Flagyl 12/12/2020 x 1 dose IV Zosyn 12/12/2020>>>>>    Subjective: Sitting up at the side of the bed.  No chest pain.  No shortness of breath.  States abdominal pain is improved.  Nausea improving.  Tolerating current diet.  Wife at bedside.   Objective: Vitals:   12/21/20 0558 12/21/20 1430 12/21/20 2201 12/22/20 0500  BP: (!)  134/96 138/86 134/80 121/80  Pulse: 85 93 82 79  Resp: 18 17 17 17   Temp: 98.8 F (37.1 C) 98.7 F (37.1 C) 98.3 F (36.8 C) 98.1 F (36.7 C)  TempSrc: Oral Oral Oral Oral  SpO2: 96% 98% 96% 97%  Weight:      Height:        Intake/Output Summary (Last 24 hours) at 12/22/2020 1336 Last data filed at 12/22/2020 0600 Gross per 24 hour  Intake 566.38 ml  Output 51 ml  Net 515.38 ml    Filed Weights   12/12/20 2000  Weight: 68.2 kg    Examination:  General exam: : NAD Respiratory system: CTA B.  No wheezes, no crackles, no rhonchi.  Normal respiratory effort.  Cardiovascular system: RRR no murmurs rubs or gallops.  No JVD.  No lower extremity edema. Gastrointestinal system: Abdomen is soft, nontender, nondistended, positive bowel sounds.  Percutaneous drain with bilious drainage noted. Central nervous system: Alert and oriented. No focal neurological deficits. Extremities: Symmetric 5 x 5 power. Skin: No rashes, lesions or ulcers Psychiatry: Judgement and insight appear normal. Mood & affect appropriate.  Data Reviewed: I have personally reviewed following labs and imaging studies  CBC: Recent Labs  Lab 12/17/20 1013 12/18/20 0511 12/19/20 0557 12/20/20 0623 12/21/20 0556 12/22/20 0455  WBC 16.2* 17.5* 12.7* 15.4* 15.2* 13.4*  NEUTROABS 10.4* 11.4* 8.8* 11.5* 10.3*  --   HGB 9.9* 9.8* 9.7* 11.3* 10.4* 10.2*  HCT 29.4* 29.9* 29.6* 33.7* 31.9* 30.8*  MCV 91.6 92.9 91.4 91.1  91.4 90.9  PLT 319 355 349 451* 491* 481*     Basic Metabolic Panel: Recent Labs  Lab 12/17/20 1013 12/18/20 0511 12/19/20 0557 12/20/20 0623 12/21/20 0556 12/22/20 0455  NA 137 136 137 139 140 134*  K 3.7 4.3 3.7 3.5 3.4* 3.9  CL 104 106 105 103 105 106  CO2 26 22 26 27 25 25   GLUCOSE 87 154* 171* 199* 119* 160*  BUN 10 10 10 10 9 11   CREATININE 1.04 1.03 0.97 1.10 1.01 0.93  CALCIUM 8.0* 7.9* 7.8* 8.3* 8.3* 7.9*  MG 1.7 2.1 2.0  --  1.8 2.3     GFR: Estimated Creatinine  Clearance: 70.3 mL/min (by C-G formula based on SCr of 0.93 mg/dL).  Liver Function Tests: Recent Labs  Lab 12/16/20 0559 12/17/20 1013 12/18/20 0511 12/19/20 0557  AST 34 34 38 31  ALT 30 28 29 25   ALKPHOS 99 109 117 102  BILITOT 2.2* 2.3* 2.3* 1.7*  PROT 5.2* 5.9* 5.9* 5.7*  ALBUMIN 2.1* 2.1* 2.4* 2.1*     CBG: Recent Labs  Lab 12/21/20 1629 12/21/20 2203 12/22/20 0255 12/22/20 0750 12/22/20 1156  GLUCAP 170* 138* 188* 150* 103*      No results found for this or any previous visit (from the past 240 hour(s)).         Radiology Studies: No results found.      Scheduled Meds:  (feeding supplement) PROSource Plus  30 mL Oral TID BM   acetaminophen  1,000 mg Oral Q6H   atorvastatin  40 mg Oral Daily   celecoxib  200 mg Oral Once   enoxaparin (LOVENOX) injection  40 mg Subcutaneous Q24H   feeding supplement  237 mL Oral BID BM   hyoscyamine  0.375 mg Oral Q12H   insulin aspart  0-15 Units Subcutaneous TID WC   insulin glargine-yfgn  7 Units Subcutaneous QHS   multivitamin with minerals  1 tablet Oral Daily   sodium chloride flush  5 mL Intracatheter Q8H   Continuous Infusions:  piperacillin-tazobactam (ZOSYN)  IV 3.375 g (12/22/20 0512)     LOS: 10 days    Time spent: 35 minutes    Irine Seal, MD [facility]   To contact the attending provider between 7A-7P or the covering provider during after hours 7P-7A, please log into the web site [email or web address] and access using universal [place] password for that web site. If you do not have the password, please call the hospital operator.  12/22/2020, 1:36 PM

## 2020-12-22 NOTE — Progress Notes (Signed)
Subjective: No pain and tolerated diet well over the weekend. Some nausea but overall controlled and no emesis. Eager to go home when able. Having good bowel function  ROS: See above, otherwise other systems negative  Objective: Vital signs in last 24 hours: Temp:  [98.1 F (36.7 C)-98.7 F (37.1 C)] 98.1 F (36.7 C) (10/03 0500) Pulse Rate:  [79-93] 79 (10/03 0500) Resp:  [17] 17 (10/03 0500) BP: (121-138)/(80-86) 121/80 (10/03 0500) SpO2:  [96 %-98 %] 97 % (10/03 0500) Last BM Date: 12/21/20  Intake/Output from previous day: 10/02 0701 - 10/03 0700 In: 686.4 [P.O.:240; I.V.:10; IV Piggyback:436.4] Out: 51 [Urine:1; Drains:50] Intake/Output this shift: No intake/output data recorded.  PE: Heart: regular Lungs: CTAB Abd: soft, very mildly TTP around drain as expected and drain with bilious output, ND, +BS MSK: calves soft without edema or TTP bilaterally Psych: A&O x3   Lab Results:  Recent Labs    12/21/20 0556 12/22/20 0455  WBC 15.2* 13.4*  HGB 10.4* 10.2*  HCT 31.9* 30.8*  PLT 491* 481*    BMET Recent Labs    12/21/20 0556 12/22/20 0455  NA 140 134*  K 3.4* 3.9  CL 105 106  CO2 25 25  GLUCOSE 119* 160*  BUN 9 11  CREATININE 1.01 0.93  CALCIUM 8.3* 7.9*    PT/INR No results for input(s): LABPROT, INR in the last 72 hours.  CMP     Component Value Date/Time   NA 134 (L) 12/22/2020 0455   K 3.9 12/22/2020 0455   CL 106 12/22/2020 0455   CO2 25 12/22/2020 0455   GLUCOSE 160 (H) 12/22/2020 0455   BUN 11 12/22/2020 0455   CREATININE 0.93 12/22/2020 0455   CREATININE 1.07 12/10/2020 1309   CALCIUM 7.9 (L) 12/22/2020 0455   PROT 5.7 (L) 12/19/2020 0557   ALBUMIN 2.1 (L) 12/19/2020 0557   AST 31 12/19/2020 0557   AST 41 12/10/2020 1309   ALT 25 12/19/2020 0557   ALT 57 (H) 12/10/2020 1309   ALKPHOS 102 12/19/2020 0557   BILITOT 1.7 (H) 12/19/2020 0557   BILITOT 4.9 (HH) 12/10/2020 1309   GFRNONAA >60 12/22/2020 0455   GFRNONAA  >60 12/10/2020 1309   Lipase     Component Value Date/Time   LIPASE 16 12/12/2020 0653       Studies/Results: No results found.  Anti-infectives: Anti-infectives (From admission, onward)    Start     Dose/Rate Route Frequency Ordered Stop   12/12/20 1800  metroNIDAZOLE (FLAGYL) IVPB 500 mg  Status:  Discontinued        500 mg 100 mL/hr over 60 Minutes Intravenous Every 8 hours 12/12/20 1214 12/12/20 1214   12/12/20 1400  piperacillin-tazobactam (ZOSYN) IVPB 3.375 g        3.375 g 12.5 mL/hr over 240 Minutes Intravenous Every 8 hours 12/12/20 1142     12/12/20 1300  ceFEPIme (MAXIPIME) 2 g in sodium chloride 0.9 % 100 mL IVPB  Status:  Discontinued        2 g 200 mL/hr over 30 Minutes Intravenous Every 12 hours 12/12/20 1138 12/12/20 1138   12/12/20 1000  cefTRIAXone (ROCEPHIN) 2 g in sodium chloride 0.9 % 100 mL IVPB        2 g 200 mL/hr over 30 Minutes Intravenous  Once 12/12/20 0951 12/12/20 1024   12/12/20 1000  metroNIDAZOLE (FLAGYL) IVPB 500 mg        500 mg 100 mL/hr over 60  Minutes Intravenous  Once 12/12/20 0951 12/12/20 1130        Assessment/Plan Pancreatic head cancer with obstructed CBD, s/p stenting and cholecystitis, s/p perc chole drain placement - no culture sent from per chole. On day 10 of IV zosyn total and on day 5 from drain placement. From our standpoint can discontinue abx after today -IR perc chole drain 9/28. - nausea stable -hyoscyamine did help with some of his episodic type pain symptoms from previously as well.  Cont this for now. - WBC has been fluctuating - now 13.4  - discussed port placement with IR, Dr. Marcello Moores, and Dr. Zenia Resides - at this time plan on placement outpatient and we are working on coordinating a time  From gen surgery standpoint stable for dc as early as tomorrow - once abx complete today  FEN - regular, IVFs VTE - Lovenox ID - zosyn 9/23>>  DM HTN HLD PCM - albumin is 2.1 AKI - cr 1.03   LOS: 10 days    Francisco Frank , [facility] Surgery 12/22/2020, 9:37 AM Please see Amion for pager number during day hours 7:00am-4:30pm or 7:00am -11:30am on weekends

## 2020-12-23 DIAGNOSIS — K810 Acute cholecystitis: Secondary | ICD-10-CM | POA: Diagnosis not present

## 2020-12-23 DIAGNOSIS — I10 Essential (primary) hypertension: Secondary | ICD-10-CM | POA: Diagnosis not present

## 2020-12-23 DIAGNOSIS — C250 Malignant neoplasm of head of pancreas: Secondary | ICD-10-CM | POA: Diagnosis not present

## 2020-12-23 DIAGNOSIS — I2510 Atherosclerotic heart disease of native coronary artery without angina pectoris: Secondary | ICD-10-CM | POA: Diagnosis not present

## 2020-12-23 LAB — CBC WITH DIFFERENTIAL/PLATELET
Abs Immature Granulocytes: 0.12 10*3/uL — ABNORMAL HIGH (ref 0.00–0.07)
Basophils Absolute: 0.2 10*3/uL — ABNORMAL HIGH (ref 0.0–0.1)
Basophils Relative: 2 %
Eosinophils Absolute: 0.4 10*3/uL (ref 0.0–0.5)
Eosinophils Relative: 4 %
HCT: 28.3 % — ABNORMAL LOW (ref 39.0–52.0)
Hemoglobin: 9.4 g/dL — ABNORMAL LOW (ref 13.0–17.0)
Immature Granulocytes: 1 %
Lymphocytes Relative: 27 %
Lymphs Abs: 2.9 10*3/uL (ref 0.7–4.0)
MCH: 30.3 pg (ref 26.0–34.0)
MCHC: 33.2 g/dL (ref 30.0–36.0)
MCV: 91.3 fL (ref 80.0–100.0)
Monocytes Absolute: 1 10*3/uL (ref 0.1–1.0)
Monocytes Relative: 9 %
Neutro Abs: 6.4 10*3/uL (ref 1.7–7.7)
Neutrophils Relative %: 57 %
Platelets: 481 10*3/uL — ABNORMAL HIGH (ref 150–400)
RBC: 3.1 MIL/uL — ABNORMAL LOW (ref 4.22–5.81)
RDW: 16.4 % — ABNORMAL HIGH (ref 11.5–15.5)
WBC: 11 10*3/uL — ABNORMAL HIGH (ref 4.0–10.5)
nRBC: 0 % (ref 0.0–0.2)

## 2020-12-23 LAB — BASIC METABOLIC PANEL
Anion gap: 5 (ref 5–15)
BUN: 10 mg/dL (ref 8–23)
CO2: 24 mmol/L (ref 22–32)
Calcium: 8.3 mg/dL — ABNORMAL LOW (ref 8.9–10.3)
Chloride: 107 mmol/L (ref 98–111)
Creatinine, Ser: 0.94 mg/dL (ref 0.61–1.24)
GFR, Estimated: >60 mL/min (ref >60)
Glucose, Bld: 146 mg/dL — ABNORMAL HIGH (ref 70–99)
Potassium: 3.7 mmol/L (ref 3.5–5.1)
Sodium: 136 mmol/L (ref 135–145)

## 2020-12-23 LAB — GLUCOSE, CAPILLARY
Glucose-Capillary: 141 mg/dL — ABNORMAL HIGH (ref 70–99)
Glucose-Capillary: 169 mg/dL — ABNORMAL HIGH (ref 70–99)
Glucose-Capillary: 148 mg/dL — ABNORMAL HIGH (ref 70–99)

## 2020-12-23 MED ORDER — ADULT MULTIVITAMIN W/MINERALS CH: 1.0000 | ORAL_TABLET | Freq: Every day | ORAL | Status: DC

## 2020-12-23 MED ORDER — LOPERAMIDE HCL 2 MG PO CAPS: 2.0000 mg | ORAL_CAPSULE | ORAL | 0 refills | Status: AC | PRN

## 2020-12-23 MED ORDER — ENSURE ENLIVE PO LIQD: 237.0000 mL | Freq: Two times a day (BID) | ORAL | 0 refills | Status: DC

## 2020-12-23 MED ORDER — ACETAMINOPHEN 500 MG PO TABS: 1000.0000 mg | ORAL_TABLET | Freq: Four times a day (QID) | ORAL | 0 refills | Status: DC | PRN

## 2020-12-23 MED ORDER — BASAGLAR KWIKPEN 100 UNIT/ML ~~LOC~~ SOPN: 7.0000 [IU] | PEN_INJECTOR | Freq: Every day | SUBCUTANEOUS | Status: AC

## 2020-12-23 MED ORDER — PROCHLORPERAZINE MALEATE 10 MG PO TABS: 10.0000 mg | ORAL_TABLET | Freq: Four times a day (QID) | ORAL | 0 refills | Status: AC | PRN

## 2020-12-23 MED ORDER — METHOCARBAMOL 500 MG PO TABS: 500.0000 mg | ORAL_TABLET | Freq: Three times a day (TID) | ORAL | 0 refills | Status: DC | PRN

## 2020-12-23 MED ORDER — CLOPIDOGREL BISULFATE 75 MG PO TABS: 75.0000 mg | ORAL_TABLET | Freq: Every day | ORAL | Status: DC

## 2020-12-23 MED ORDER — HYOSCYAMINE SULFATE ER 0.375 MG PO TB12: 0.3750 mg | ORAL_TABLET | Freq: Two times a day (BID) | ORAL | 0 refills | Status: DC

## 2020-12-23 MED ORDER — CLOPIDOGREL BISULFATE 75 MG PO TABS: 75.0000 mg | ORAL_TABLET | Freq: Every day | ORAL | 0 refills | Status: AC

## 2020-12-23 MED ORDER — PROSOURCE PLUS PO LIQD: 30.0000 mL | Freq: Three times a day (TID) | ORAL | Status: DC

## 2020-12-23 NOTE — Discharge Instructions (Signed)
Continue to monitor drain output. You may change bandage every 2-3 days or more frequently as needed.  Should drain have significant decrease in output or any concerns arise, call IR at [phone] or [phone].  IR will call you regarding follow up appointment to be had in 4-6 weeks to evaluate drain.

## 2020-12-23 NOTE — Care Management Important Message (Signed)
Important Message  Patient Details IM Letter given to the Patient. Name: Francisco Frank MRN: [id number] Date of Birth: 06-Jul-1950   Medicare Important Message Given:  Yes     Kerin Salen 12/23/2020, 11:18 AM

## 2020-12-23 NOTE — Progress Notes (Addendum)
Subjective: No pain and tolerating his diet  ROS: See above, otherwise other systems negative  Objective: Vital signs in last 24 hours: Temp:  [98 F (36.7 C)-99 F (37.2 C)] 98 F (36.7 C) (10/04 0516) Pulse Rate:  [69-81] 69 (10/04 0516) Resp:  [14-16] 14 (10/04 0516) BP: (115-119)/(78-82) 119/78 (10/04 0516) SpO2:  [97 %-99 %] 99 % (10/04 0516) Last BM Date: 12/21/20  Intake/Output from previous day: 10/03 0701 - 10/04 0700 In: 400.2 [P.O.:360; IV Piggyback:40.2] Out: -  Intake/Output this shift: No intake/output data recorded.  PE: Abd: soft, very mildly TTP around drain as expected and drain with bilious output, ND, +BS Psych: A&O x3   Lab Results:  Recent Labs    12/22/20 0455 12/23/20 0532  WBC 13.4* 11.0*  HGB 10.2* 9.4*  HCT 30.8* 28.3*  PLT 481* 481*   BMET Recent Labs    12/22/20 0455 12/23/20 0532  NA 134* 136  K 3.9 3.7  CL 106 107  CO2 25 24  GLUCOSE 160* 146*  BUN 11 10  CREATININE 0.93 0.94  CALCIUM 7.9* 8.3*   PT/INR No results for input(s): LABPROT, INR in the last 72 hours.  CMP     Component Value Date/Time   NA 136 12/23/2020 0532   K 3.7 12/23/2020 0532   CL 107 12/23/2020 0532   CO2 24 12/23/2020 0532   GLUCOSE 146 (H) 12/23/2020 0532   BUN 10 12/23/2020 0532   CREATININE 0.94 12/23/2020 0532   CREATININE 1.07 12/10/2020 1309   CALCIUM 8.3 (L) 12/23/2020 0532   PROT 5.7 (L) 12/19/2020 0557   ALBUMIN 2.1 (L) 12/19/2020 0557   AST 31 12/19/2020 0557   AST 41 12/10/2020 1309   ALT 25 12/19/2020 0557   ALT 57 (H) 12/10/2020 1309   ALKPHOS 102 12/19/2020 0557   BILITOT 1.7 (H) 12/19/2020 0557   BILITOT 4.9 (HH) 12/10/2020 1309   GFRNONAA >60 12/23/2020 0532   GFRNONAA >60 12/10/2020 1309   Lipase     Component Value Date/Time   LIPASE 16 12/12/2020 0653       Studies/Results: No results found.  Anti-infectives: Anti-infectives (From admission, onward)    Start     Dose/Rate Route Frequency  Ordered Stop   12/12/20 1800  metroNIDAZOLE (FLAGYL) IVPB 500 mg  Status:  Discontinued        500 mg 100 mL/hr over 60 Minutes Intravenous Every 8 hours 12/12/20 1214 12/12/20 1214   12/12/20 1400  piperacillin-tazobactam (ZOSYN) IVPB 3.375 g        3.375 g 12.5 mL/hr over 240 Minutes Intravenous Every 8 hours 12/12/20 1142 12/23/20 0120   12/12/20 1300  ceFEPIme (MAXIPIME) 2 g in sodium chloride 0.9 % 100 mL IVPB  Status:  Discontinued        2 g 200 mL/hr over 30 Minutes Intravenous Every 12 hours 12/12/20 1138 12/12/20 1138   12/12/20 1000  cefTRIAXone (ROCEPHIN) 2 g in sodium chloride 0.9 % 100 mL IVPB        2 g 200 mL/hr over 30 Minutes Intravenous  Once 12/12/20 0951 12/12/20 1024   12/12/20 1000  metroNIDAZOLE (FLAGYL) IVPB 500 mg        500 mg 100 mL/hr over 60 Minutes Intravenous  Once 12/12/20 0951 12/12/20 1130        Assessment/Plan Pancreatic head cancer with obstructed CBD, s/p stenting and cholecystitis, s/p perc chole drain placement - no culture sent from per chole.  On day 10 of IV zosyn total and on day 5 from drain placement. From our standpoint can discontinue abx at this time -IR perc chole drain 9/28. - nausea stable -hyoscyamine did help with some of his episodic type pain symptoms from previously as well.   - WBC down to 11K - discussed port placement with IR, Dr. Marcello Moores, and Dr. Zenia Resides - at this time plan on placement outpatient and we are working on coordinating a time -surgically stable for DC home today  FEN - regular, IVFs VTE - Lovenox ID - zosyn 9/23>>  DM HTN HLD PCM - albumin is 2.1 AKI - cr 1.03   LOS: 11 days    Henreitta Cea , [facility] Surgery 12/23/2020, 8:05 AM Please see Amion for pager number during day hours 7:00am-4:30pm or 7:00am -11:30am on weekends

## 2020-12-23 NOTE — Discharge Summary (Signed)
Physician Discharge Summary  Francisco Frank WIO:[id number] DOB: [birth year] DOA: 12/12/2020  PCP: Kennieth Rad, MD  Admit date: 12/12/2020 Discharge date: 12/23/2020  Time spent: 60 minutes  Recommendations for Outpatient Follow-up:  Follow-up with Dr. Benay Spice, oncology as previously scheduled. Follow-up with Dr. Zenia Resides, general surgery for Port-A-Cath placement and hospital follow-up. Follow-up with Kennieth Rad, MD in 2 weeks.  On follow-up patient will need a basic metabolic profile, magnesium level, CBC done to follow-up on electrolytes and renal function. Follow-up with Dr. Annamaria Boots, IR in 4 weeks for follow-up on percutaneous cholecystostomy drain placement.   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 male with medical history significant for recently diagnosed pancreatic cancer complicated by biliary duct obstruction, who presented with abdominal pain.   Has had multiple recent IR procedure for biliary duct obstruction and has had intermittent ruq abdominal pain, sometimes severe, sometimes with nausea/vomiting, for at least a month. This has been worsening, sometimes brought on by eating. No chest pain or sob. No fevers but occasional chills. Yesterday had internal/external biliary drain removed by IR; does have biliary stent in place.   ED Course:    Imaging revealled acute cholecystitis. Started on antibiotics. Initial BPs soft as low as 63/49.  2 L LR bolus given. General surgery and IR consulted, both advising medical mgmt for now.  Hospital Course:  1 Acute cholecystitis, sepsis ruled out -On admission initially there was concern for sepsis with hypotension however blood pressure improved with fluids did not require any pressors.  Patient noted to have a leukocytosis and findings consistent with acute cholecystitis.  -Patient made n.p.o. initially general surgery consultation obtained as well as IR consultation. -Blood cultures negative x5 days. -Patient with some improvement with epigastric and upper abdominal pain post percutaneous drain placement..   -CT abdomen and pelvis done concerning for inflammation and stranding in the right upper quadrant surrounding the gallbladder. -Right upper quadrant ultrasound done with mild gallbladder wall thickening and pericholecystic fluid, given patient's pain located left abdomen epigastric findings could be related to chronic cholecystitis better evaluated with HIDA scan.  1.1 cm nonshadowing echogenic structure within the proximal gallbladder lumen favored to be sludge given lack of significant into flow.  Follow-up ultrasound to be done in 6 months to document stability/resolution. -HIDA scan done with absent gallbladder activity, consistent with cystic duct obstruction/acute cholecystitis.  No evidence of biliary obstruction. -Patient seen by general surgery who recommended percutaneous cholecystostomy tube placement per IR. -Per general surgery hycosamine may have helped with some of his episodic type pain symptoms. -Leukocytosis fluctuating and was down to 11.0 by day of discharge.  -Status post percutaneous cholecystostomy tube placement 12/17/2020 with cultures pending by day of discharge.   -Diet was advanced to a solid diet which patient tolerated.   -Patient also completed a full course of IV Zosyn approximately 10 days during the hospitalization no further antibiotics recommended on  discharge.   -  Patient was discharged home with percutaneous drain placement with outpatient follow-up with IR.   -Outpatient follow-up with general surgery.    2.  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 -Oncology, Dr. Benay Spice informed of admission and per patient he was seen by Dr. Benay Spice during the hospitalization.   -Once patient's diet was resumed patient was placed back on home regimen Creon.   -Patient had some bouts of nausea during the hospitalization relieved on IV Compazine.   -Nausea improved and patient tolerating diet by day of discharge.    3.  Insulin-dependent diabetes mellitus II -Hemoglobin A1c 9.6  (11/09/2020). -Patient maintained on Semglee 7 units daily as well as sliding scale insulin during the hospitalization with good blood glucose control.   -Patient's long-acting insulin will be decreased to 7 units daily on discharge.   -Outpatient follow-up with PCP.  4.  Hypomagnesemia/hypokalemia -Repleted during the hospitalization and outpatient follow-up with PCP.   5.  Coronary artery artery disease -Stable. -Was on Plavix and aspirin at home prior to admission. -Plavix held during the hospitalization due to concerns patient may need surgery. -On day of discharge it was determined by general surgery that no surgical procedure planned and patient may resume Plavix on day of discharge however will need to discontinue Plavix on 12/26/2020 in anticipation of Port-A-Cath placement in the outpatient setting.  -Outpatient follow-up.    6.  Anemia of chronic disease -Hemoglobin stabilized at 9.4 by day of discharge.   -Outpatient follow-up.   7.  AKI with mild metabolic acidosis -Likely secondary to problem #1 and prerenal azotemia. -renal function improved with hydration.   8.  Hypertension -BP stable.   -Antihypertensive  medications held during the hospitalization and will not be resumed on discharge.   -Outpatient follow-up with PCP.    9.  Loose stools/diarrhea -Imodium as needed.  Procedures: CT abdomen and pelvis 12/12/2020 Chest x-ray 12/12/2020 HIDA scan 12/16/2020 Percutaneous cholecystostomy tube placement IR 12/17/2020 Abdominal ultrasound 12/15/2020 IR cholangiogram 12/11/2020 per Dr. Laurence Ferrari  Consultations: IR: Dr. Annamaria Boots 12/17/2020 General surgery Oncology  Discharge Exam: Vitals:   12/22/20 2055 12/23/20 0516  BP: 115/79 119/78  Pulse: 74 69  Resp: 14 14  Temp: 99 F (37.2 C) 98 F (36.7 C)  SpO2: 99% 99%    General: NAD Cardiovascular: RRR.  No murmurs rubs or gallops.  No JVD.  No lower extremity edema. Respiratory: Lungs clear to auscultation bilaterally.  No wheezes, no crackles, no rhonchi.  Discharge Instructions   Discharge Instructions     Diet general   Complete by: As directed    Discharge wound care:   Complete by: As directed    As above.   Increase activity slowly   Complete by: As directed       Allergies as of 12/23/2020       Reactions   Norco [hydrocodone-acetaminophen] Itching        Medication List     STOP taking these medications    amLODipine 5 MG tablet Commonly known as: NORVASC   atenolol 50 MG tablet Commonly known as: TENORMIN   cholestyramine 4 g packet Commonly known as: QUESTRAN       TAKE these medications    (feeding supplement) PROSource Plus liquid Take 30 mLs by mouth 3 (three) times daily between meals.   feeding supplement Liqd Take 237 mLs by mouth 2 (two) times daily  between meals.   acetaminophen 500 MG tablet Commonly known as: TYLENOL Take 2 tablets (1,000 mg total) by mouth every 6 (six) hours as needed.   atorvastatin 40 MG tablet Commonly known as: LIPITOR Take 40 mg by mouth daily.   Basaglar KwikPen 100 UNIT/ML Inject 7 Units into the skin at bedtime. What changed: how much to take   BD  Pen Needle Nano 2nd Gen 32G X 4 MM Misc Generic drug: Insulin Pen Needle   clopidogrel 75 MG tablet Commonly known as: PLAVIX Take 1 tablet (75 mg total) by mouth at bedtime for 3 days.   diphenhydramine-acetaminophen 25-500 MG Tabs tablet Commonly known as: TYLENOL PM Take 1 tablet by mouth at bedtime as needed. What changed: reasons to take this   hyoscyamine 0.375 MG 12 hr tablet Commonly known as: LEVBID Take 1 tablet (0.375 mg total) by mouth every 12 (twelve) hours.   lipase/protease/amylase 12000-38000 units Cpep capsule Commonly known as: CREON Take 1 capsule by mouth with breakfast, with lunch, and with evening meal.   loperamide 2 MG capsule Commonly known as: IMODIUM Take 1 capsule (2 mg total) by mouth as needed for diarrhea or loose stools.   methocarbamol 500 MG tablet Commonly known as: ROBAXIN Take 1 tablet (500 mg total) by mouth 3 (three) times daily as needed for muscle spasms (pain, muscle spasms).   multivitamin with minerals Tabs tablet Take 1 tablet by mouth daily. Start taking on: December 24, 2020   NovoLOG FlexPen 100 UNIT/ML FlexPen Generic drug: insulin aspart Inject 2-10 Units into the skin 3 (three) times daily as needed for high blood sugar. If BS is 150-200=2 units, 201-250=4 units, 251-300=6 units, 301-350=8 units, 351-400=10 units.   prochlorperazine 10 MG tablet Commonly known as: COMPAZINE Take 1 tablet (10 mg total) by mouth every 6 (six) hours as needed for nausea or vomiting.   traMADol 50 MG tablet Commonly known as: ULTRAM Take 50 mg by mouth every 4 (four) hours as needed for moderate pain or severe pain.               Discharge Care Instructions  (From admission, onward)           Start     Ordered   12/23/20 0000  Discharge wound care:       Comments: As above.   12/23/20 1229           Allergies  Allergen Reactions   Norco [Hydrocodone-Acetaminophen] Itching    Follow-up Information     Dwan Bolt, MD Follow up.   Specialty: General Surgery Why: our office is working on follow up for [facility] a cath placement for you and should contact you with date and time information Contact information: [place] [place] [address] [phone]         Greggory Keen, MD Follow up in 4 week(s).   Specialties: Interventional Radiology, Radiology Contact information: [address] [place] [address] [phone]         Kennieth Rad, MD. Schedule an appointment as soon as possible for a visit in 2 week(s).   Specialties: Internal Medicine, Infectious Diseases Contact information: [facility] [address] [place] [address] [phone]         Ladell Pier, MD Follow up.   Specialty: Oncology Why: Follow-up as scheduled. Contact information: [place] [address] [phone]         [facility]-INTERVENTIONAL RADIOLOGY .   Specialty: Radiology Contact information: [place] [id number] [place] [phone]                 The results of significant diagnostics from this hospitalization (including imaging, microbiology, ancillary and laboratory) are listed below for reference.    Significant Diagnostic Studies: CT ABDOMEN PELVIS W WO CONTRAST  Result Date: 11/28/2020 CLINICAL DATA:  Staging pancreas mass. Evaluate for vascular invasion. EXAM: CT ABDOMEN AND PELVIS WITHOUT AND WITH CONTRAST TECHNIQUE: Multidetector CT imaging of the abdomen and pelvis was performed following the standard protocol before and following the bolus administration of intravenous contrast. CONTRAST:  67mL OMNIPAQUE IOHEXOL 350 MG/ML SOLN COMPARISON:  MRI 11/06/2020. FINDINGS: Lower chest: No acute abnormality. Hepatobiliary: No focal liver lesion identified. Pneumobilia is identified status post percutaneous transhepatic biliary stent placement  and internal bile duct stent placement. Interval decompression of previous dilated intrahepatic and common bile ducts. Pancreas: Again noted is atrophy of the body through tail of pancreas with diffuse main duct dilatation. Mass within head of pancreas is somewhat ill-defined measuring approximately 3.5 x 2.8 cm, image 60/3. There is no involvement scratch set scratch set No signs of encasement of the celiac trunk or superior mesenteric artery. There is partial encasement of the portal venous confluence (at least 180 degrees), image 32/4. There is also partial encasement (approximately 180 degrees) of the superior mesenteric vein just distal to the confluence, image 36/4. Ventral extension of tumor abuts the posterior wall of the proximal duodenum, image 32/4. Cannot exclude invasion. Spleen: Normal in size without focal abnormality. Adrenals/Urinary Tract: Normal adrenal glands. The kidneys are unremarkable. No mass or hydronephrosis. Urinary bladder unremarkable. Stomach/Bowel: Stomach is nondilated. No bowel wall thickening, inflammation, or distension. Vascular/Lymphatic: Aortic atherosclerosis without aneurysm. No abdominal adenopathy. No pelvic adenopathy. Reproductive: Prostate is unremarkable. Other: No abdominal wall hernia or abnormality. No abdominopelvic ascites. Musculoskeletal: No acute or significant osseous findings. IMPRESSION: 1. Mass within head of pancreas is somewhat ill-defined measuring approximately 3.5 x 2.8 cm. There is partial encasement of the portal venous confluence and superior mesenteric vein. No signs of encasement of the celiac trunk or superior mesenteric artery. 2. No findings of nodal metastasis or solid organ metastasis within the abdomen or pelvis. 3. Status post percutaneous transhepatic biliary stent placement and internal bile duct stent placement with decompression of previous dilated intrahepatic and common bile ducts. 4. Aortic atherosclerosis. Aortic Atherosclerosis  (ICD10-I70.0). Electronically Signed   By: Kerby Moors M.D.   On: 11/28/2020 16:24   CT ABDOMEN PELVIS W CONTRAST  Addendum Date: 12/12/2020   ADDENDUM REPORT: 12/12/2020 09:21 ADDENDUM: Study discussed by telephone with PA Deno Etienne on 12/12/2020 at 09:19. We discussed conservative treatment including antibiotics and hydration. But the Interventional Radiology team at [facility] is being updated on Mr. Odonell situation. Electronically Signed   By: Genevie Ann M.D.   On: 12/12/2020 09:21   Result Date: 12/12/2020 CLINICAL DATA:  70 year old male status post removal of transhepatic internal/external biliary drainage catheter yesterday. Underlying malignant obstructed jaundice secondary to pancreatic cancer. Indwelling metal biliary stent. EXAM: CT ABDOMEN AND PELVIS WITH CONTRAST TECHNIQUE: Multidetector CT imaging of the abdomen and pelvis was performed using the standard protocol following bolus administration of intravenous contrast. CONTRAST:  17mL OMNIPAQUE IOHEXOL 350 MG/ML SOLN COMPARISON:  CT Abdomen and Pelvis 11/28/2020 and earlier. FINDINGS: Lower chest: Minor atelectasis, greater on the left. Otherwise negative. Hepatobiliary: Mildly dilated and inflamed gallbladder with  confluent regional soft tissue stranding extending to the porta hepatis (series 2, image 28). Gallbladder wall thickening. But there is also gas within the gallbladder lumen, similar to diffuse pneumobilia elsewhere. And the metallic CBD stent also contains gas and appears to be patent terminating in the duodenum. Subtle former drainage tract through the liver is identified with no adverse features. No perihepatic free fluid. Liver enhancement maintained. Pancreas: Spiculated pancreatic head mass with upstream ductal dilatation and pancreatic atrophy. Spleen: Negative. Adrenals/Urinary Tract: Negative. Symmetric renal enhancement and early contrast excretion. Stomach/Bowel: No dilated large or small bowel. Secondary  inflammation of the hepatic flexure appears related to the gallbladder on coronal image 49. Appendix is normal on series 2, image 64. Decompressed and negative terminal ileum. No free air. The distal stomach and duodenum also appear actively inflamed at the porta hepatis. The distal duodenum appear spared. There is only mild gas and fluid distention of the stomach. Vascular/Lymphatic: Aortoiliac calcified atherosclerosis. Major arterial structures remain patent. The portal venous system remains patent. Small but numerous peripancreatic lymph nodes appear stable from earlier this month. Reproductive: Negative. Other: No pelvic free fluid. Musculoskeletal: Stable. No acute or suspicious osseous lesion identified. IMPRESSION: 1. Positive for Acute Cholecystitis, with confluent regional inflammation secondarily involving the gastric antrum, duodenum, and hepatic flexure of colon. But the metallic CBD stent appears patent with no adverse features. And there is pneumobilia within the gallbladder lumen. 2. Transhepatic biliary drain removed with no adverse features along the former drainage tract. 3. Known Pancreatic Tumor. Electronically Signed: By: Genevie Ann M.D. On: 12/12/2020 09:07   NM Hepato W/EF  Result Date: 12/16/2020 CLINICAL DATA:  Left-sided abdominal pain for several months. EXAM: NUCLEAR MEDICINE HEPATOBILIARY IMAGING TECHNIQUE: Sequential images of the abdomen were obtained out to 60 minutes following intravenous administration of radiopharmaceutical. Due to nonvisualization of gallbladder at 60 minutes, 3 mg of morphine was infused intravenously, and imaging continued for another 30 minutes. RADIOPHARMACEUTICALS:  7.7 mCi Tc-20m  Choletec IV COMPARISON:  None. FINDINGS: Prompt uptake and biliary excretion of activity by the liver is seen. Biliary activity passes into small bowel, consistent with patent common bile duct. However, no gallbladder activity is seen either before or following morphine  administration, consistent with cystic duct obstruction. IMPRESSION: Absent gallbladder activity, consistent with cystic duct obstruction/acute cholecystitis. No evidence of biliary obstruction. Electronically Signed   By: Marlaine Hind M.D.   On: 12/16/2020 13:54   US Abdomen Limited  Result Date: 12/15/2020 CLINICAL DATA:  Possible cholecystitis seen on CT. Left upper quadrant epigastric abdominal pain. EXAM: ULTRASOUND ABDOMEN LIMITED RIGHT UPPER QUADRANT COMPARISON:  None. FINDINGS: Gallbladder: Air again seen within the gallbladder lumen consistent with biliary stent. 1.1 cm nonshadowing echogenic structure within the gallbladder lumen likely sludge. Polyp considered less likely given lack of internal flow. Mild gallbladder wall thickening measuring up to 4 mm. Small amount of pericholecystic fluid is present. Sonographic Percell Miller sign is negative per technologist. Common bile duct: Diameter: 4 mm Liver: No focal hepatic lesion. Pneumobilia again seen throughout the liver consistent with CBD stent. Portal vein is patent on color Doppler imaging with normal direction of blood flow towards the liver. Other: None. IMPRESSION: 1. Mild gallbladder wall thickening and pericholecystic fluid. Given that the patient's pain is located in the left abdomen and epigastric region, findings are unlikely to be related to acute cholecystitis. Findings could be related to chronic cholecystitis which could be better evaluated with HIDA scan. 2. 1.1 cm nonshadowing echogenic structure within the  proximal gallbladder lumen is favored to be sludge given lack of significant internal flow. Follow-up ultrasound should be performed in 6 months to document stability/resolution. Electronically Signed   By: Miachel Roux M.D.   On: 12/15/2020 16:08   IR Perc Cholecystostomy  Result Date: 12/17/2020 INDICATION: Acute calculus cholecystitis EXAM: ULTRASOUND FLUOROSCOPIC 10 FRENCH PERCUTANEOUS CHOLECYSTOSTOMY Date:  12/17/2020 12/17/2020  3:08 pm Radiologist:  M. Daryll Brod, MD Guidance:  ULTRASOUND AND FLUOROSCOPIC FLUOROSCOPY TIME:  THREE minutes 0 seconds (28 mGy). MEDICATIONS: 3.375 G ZOSYN, administered within 1 procedure ANESTHESIA/SEDATION: Moderate (conscious) sedation was employed during this procedure. A total of Versed 2.0 mg and Fentanyl 100 mcg was administered intravenously. Moderate Sedation Time: 26 minutes. The patient's level of consciousness and vital signs were monitored continuously by radiology nursing throughout the procedure under my direct supervision. CONTRAST:  13mL OMNIPAQUE IOHEXOL 300 MG/ML  SOLN COMPLICATIONS: None immediate. PROCEDURE: Informed consent was obtained from the patient following explanation of the procedure, risks, benefits and alternatives. The patient understands, agrees and consents for the procedure. All questions were addressed. A time out was performed. Maximal barrier sterile technique utilized including caps, mask, sterile gowns, sterile gloves, large sterile drape, hand hygiene, and ChloraPrep. Previous imaging reviewed. Distended gallbladder was localized in the right upper quadrant through a lower intercostal space. Overlying skin marked. Under sterile conditions and local anesthesia, a 21 gauge access needle was advanced transhepatically into the gallbladder. Needle position confirmed with ultrasound. There was return of exudative bile. Sample sent for culture. Guidewire inserted followed by tract dilatation to insert a 10 [place] drain. Drain catheter position confirmed in the gallbladder. Retention loop formed. Contrast injection confirms position. 30 cc purulent bile removed. Catheter secured with Prolene suture and connected to external gravity drainage bag. Sterile dressing applied. No immediate complication. Patient tolerated the procedure well. IMPRESSION: Successful ultrasound and fluoroscopic 10 French percutaneous cholecystostomy Electronically Signed   By: Jerilynn Mages.  Shick M.D.   On:  12/17/2020 15:55   DG Chest Port 1 View  Result Date: 12/12/2020 CLINICAL DATA:  70 year old male with abnormal pulmonary auscultation in the right lower lung. Abdominal pain following removal of internal/external biliary drain yesterday. EXAM: PORTABLE CHEST 1 VIEW COMPARISON:  Chest CTA 08/08/2020. FINDINGS: Portable AP semi upright view at 0832 hours. Lower lung volumes. Stable mild elevation of the right hemidiaphragm. Normal cardiac size and mediastinal contours. Visualized tracheal air column is within normal limits. Allowing for portable technique the lungs are clear. No pneumothorax. Negative visible bowel gas pattern. No acute osseous abnormality identified. IMPRESSION: Lower lung volumes.  No acute cardiopulmonary abnormality. Electronically Signed   By: Genevie Ann M.D.   On: 12/12/2020 08:56   IR CHOLANGIOGRAM EXISTING TUBE  Result Date: 12/11/2020 INDICATION: 70 year old male with malignant obstructed jaundice secondary to pancreatic cancer. He has an internal metallic stent as well as an internal/external biliary drainage catheter which has been capped. He is tolerating the capped catheter well. His bilirubin continues to trend down and is the lowest it has been since the time of stent placement. He finds the internal/external drainage catheter very painful. He presents today for transhepatic cholangiogram and possible drain removal. EXAM: CHOLANGIOGRAM VIA EXISTING CATHETER MEDICATIONS: None ANESTHESIA/SEDATION: None FLUOROSCOPY TIME:  Fluoroscopy Time: 0 minutes 36 seconds (4 mGy). COMPLICATIONS: None immediate. PROCEDURE: Informed written consent was obtained from the patient after a thorough discussion of the procedural risks, benefits and alternatives. All questions were addressed. Maximal Sterile Barrier Technique was utilized including caps, mask, sterile gowns, sterile  gloves, sterile drape, hand hygiene and skin antiseptic. A timeout was performed prior to the initiation of the procedure.  The existing catheter was freed from the retention suture, transected and removed over a Bentson wire. A 9 French vascular sheath was advanced over the wire and positioned in the right hepatic duct. A transhepatic cholangiogram was then performed. Minimal biliary ductal dilatation. Contrast material passes freely through the widely patent stent and into the duodenum. There may be minimal sludge present. The decision was made to proceed with drain removal. The wire and sheath were removed. A bandage was placed over the access site. IMPRESSION: Widely patent internal self expanding covered biliary stent. Given toleration of capped trial and decreasing bilirubin, the decision was made to remove the internal/external biliary drainage catheter. Should patient developed recurrent symptoms of obstruction, the stent should be approachable endoscopically in the future. Electronically Signed   By: Jacqulynn Cadet M.D.   On: 12/11/2020 15:39    Microbiology: No results found for this or any previous visit (from the past 240 hour(s)).   Labs: Basic Metabolic Panel: Recent Labs  Lab 12/17/20 1013 12/18/20 0511 12/19/20 0557 12/20/20 0623 12/21/20 0556 12/22/20 0455 12/23/20 0532  NA 137 136 137 139 140 134* 136  K 3.7 4.3 3.7 3.5 3.4* 3.9 3.7  CL 104 106 105 103 105 106 107  CO2 26 22 26 27 25 25 24   GLUCOSE 87 154* 171* 199* 119* 160* 146*  BUN 10 10 10 10 9 11 10   CREATININE 1.04 1.03 0.97 1.10 1.01 0.93 0.94  CALCIUM 8.0* 7.9* 7.8* 8.3* 8.3* 7.9* 8.3*  MG 1.7 2.1 2.0  --  1.8 2.3  --    Liver Function Tests: Recent Labs  Lab 12/17/20 1013 12/18/20 0511 12/19/20 0557  AST 34 38 31  ALT 28 29 25   ALKPHOS 109 117 102  BILITOT 2.3* 2.3* 1.7*  PROT 5.9* 5.9* 5.7*  ALBUMIN 2.1* 2.4* 2.1*   No results for input(s): LIPASE, AMYLASE in the last 168 hours. No results for input(s): AMMONIA in the last 168 hours. CBC: Recent Labs  Lab 12/18/20 0511 12/19/20 0557 12/20/20 0623 12/21/20 0556  12/22/20 0455 12/23/20 0532  WBC 17.5* 12.7* 15.4* 15.2* 13.4* 11.0*  NEUTROABS 11.4* 8.8* 11.5* 10.3*  --  6.4  HGB 9.8* 9.7* 11.3* 10.4* 10.2* 9.4*  HCT 29.9* 29.6* 33.7* 31.9* 30.8* 28.3*  MCV 92.9 91.4 91.1 91.4 90.9 91.3  PLT 355 349 451* 491* 481* 481*   Cardiac Enzymes: No results for input(s): CKTOTAL, CKMB, CKMBINDEX, TROPONINI in the last 168 hours. BNP: BNP (last 3 results) No results for input(s): BNP in the last 8760 hours.  ProBNP (last 3 results) No results for input(s): PROBNP in the last 8760 hours.  CBG: Recent Labs  Lab 12/22/20 1156 12/22/20 1648 12/22/20 2056 12/23/20 0255 12/23/20 0757  GLUCAP 103* 141* 153* 169* 148*       Signed:  Irine Seal MD.  [facility] 12/23/2020, 4:10 PM

## 2020-12-23 NOTE — Progress Notes (Signed)
Supervising Physician: Ruthann Cancer  Patient Status:  Francisco Frank - In-pt  Chief Complaint:  Acute Cholecystitis  Subjective:  No pain.  Draining well.  Regaining appetite and without nausea.  Allergies: Norco [hydrocodone-acetaminophen]  Medications: Prior to Admission medications   Medication Sig Start Date End Date Taking? Authorizing Provider  amLODipine (NORVASC) 5 MG tablet Take 5 mg by mouth daily. 10/22/20  Yes [provider]  atenolol (TENORMIN) 50 MG tablet Take 50 mg by mouth daily.   Yes [provider]  atorvastatin (LIPITOR) 40 MG tablet Take 40 mg by mouth daily. 11/16/20  Yes [provider]  clopidogrel (PLAVIX) 75 MG tablet Take 1 tablet (75 mg total) by mouth at bedtime. 11/14/20  Yes Kc, Maren Beach, MD  diphenhydramine-acetaminophen (TYLENOL PM) 25-500 MG TABS tablet Take 1 tablet by mouth at bedtime as needed. Patient taking differently: Take 1 tablet by mouth at bedtime as needed (sleep). 11/26/20  Yes Ladell Pier, MD  Insulin Glargine (BASAGLAR KWIKPEN) 100 UNIT/ML Inject 9 Units into the skin at bedtime. 09/27/20  Yes [provider]  NOVOLOG FLEXPEN 100 UNIT/ML FlexPen Inject 2-10 Units into the skin 3 (three) times daily as needed for high blood sugar. If BS is 150-200=2 units, 201-250=4 units, 251-300=6 units, 301-350=8 units, 351-400=10 units. 09/18/20  Yes [provider]  traMADol (ULTRAM) 50 MG tablet Take 50 mg by mouth every 4 (four) hours as needed for moderate pain or severe pain. 06/11/20  Yes [provider]  BD PEN NEEDLE NANO 2ND GEN 32G X 4 MM MISC  11/17/20   [provider]  cholestyramine (QUESTRAN) 4 g packet Take 1 packet (4 g total) by mouth 2 (two) times daily before a meal for 5 days. Patient not taking: No sig reported 11/29/20 01/24/21  Dwan Bolt, MD  lipase/protease/amylase (CREON) 12000-38000 units CPEP capsule Take 1 capsule by mouth with breakfast, with lunch, and with evening  meal. 12/02/20 12/02/21  [provider]     Vital Signs: BP 119/78 (BP Location: Left Arm)   Pulse 69   Temp 98 F (36.7 C) (Oral)   Resp 14   Ht 5\' 8"  (1.727 m)   Wt 150 lb 5.7 oz (68.2 kg)   SpO2 99%   BMI 22.86 kg/m   Pt sitting upright in bed, in no acute distress.  Drain site bandage is C/D/I.  Approx 50cc bile in bulb.  No apparent purulence.  Imaging: No results found.  Labs:  CBC: Recent Labs    12/20/20 0623 12/21/20 0556 12/22/20 0455 12/23/20 0532  WBC 15.4* 15.2* 13.4* 11.0*  HGB 11.3* 10.4* 10.2* 9.4*  HCT 33.7* 31.9* 30.8* 28.3*  PLT 451* 491* 481* 481*    COAGS: Recent Labs    11/10/20 1019 11/19/20 0850 11/28/20 1005 12/13/20 0546  INR 0.9 0.9 0.9 1.1    BMP: Recent Labs    12/20/20 0623 12/21/20 0556 12/22/20 0455 12/23/20 0532  NA 139 140 134* 136  K 3.5 3.4* 3.9 3.7  CL 103 105 106 107  CO2 27 25 25 24   GLUCOSE 199* 119* 160* 146*  BUN 10 9 11 10   CALCIUM 8.3* 8.3* 7.9* 8.3*  CREATININE 1.10 1.01 0.93 0.94  GFRNONAA >60 >60 >60 >60    LIVER FUNCTION TESTS: Recent Labs    12/16/20 0559 12/17/20 1013 12/18/20 0511 12/19/20 0557  BILITOT 2.2* 2.3* 2.3* 1.7*  AST 34 34 38 31  ALT 30 28 29 25   ALKPHOS  99 109 117 102  PROT 5.2* 5.9* 5.9* 5.7*  ALBUMIN 2.1* 2.1* 2.4* 2.1*    Assessment and Plan: 6 days s/p percutaneous cholecystostomy --draining well --wbc downtrending --will need drain follow up appointment after discharge.   Electronically Signed: Pasty Spillers, PA 12/23/2020, 10:28 AM   I spent a total of 25 Minutes at the the patient's bedside AND on the patient's hospital floor or unit, greater than 50% of which was counseling/coordinating care for acute cholecystitis and drain.

## 2020-12-24 ENCOUNTER — Inpatient Hospital Stay: Payer: Medicare Other

## 2020-12-24 ENCOUNTER — Inpatient Hospital Stay: Payer: Medicare Other | Admitting: Oncology

## 2020-12-24 ENCOUNTER — Inpatient Hospital Stay: Payer: Medicare Other | Admitting: Nutrition

## 2020-12-24 ENCOUNTER — Encounter: Payer: Self-pay | Admitting: *Deleted

## 2020-12-24 NOTE — Progress Notes (Signed)
Spoke with patient regarding plan for upcoming appts. He will have PAC placed by Dr Zenia Resides on 10/12 and begin treatment FOLFOX (first cycle) on 10/13 with pump D/C on 10/15 at [facility]. Verbalized understanding. Encouraged to call with any issues or questions.

## 2020-12-25 ENCOUNTER — Ambulatory Visit: Payer: Self-pay | Admitting: Genetic Counselor

## 2020-12-25 ENCOUNTER — Other Ambulatory Visit: Payer: Self-pay | Admitting: *Deleted

## 2020-12-25 ENCOUNTER — Telehealth: Payer: Self-pay | Admitting: Genetic Counselor

## 2020-12-25 DIAGNOSIS — Z1379 Encounter for other screening for genetic and chromosomal anomalies: Secondary | ICD-10-CM

## 2020-12-25 DIAGNOSIS — C250 Malignant neoplasm of head of pancreas: Secondary | ICD-10-CM

## 2020-12-25 DIAGNOSIS — C259 Malignant neoplasm of pancreas, unspecified: Secondary | ICD-10-CM

## 2020-12-25 NOTE — Telephone Encounter (Signed)
Discussed genetic testing results.  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   Discussed that we do not know why he has pancreatic cancer. It could be sporadic/familial, due to a different gene that we are not testing, or maybe our current technology may not be able to pick something up.  It will be important for him and his family to keep in contact with genetics to keep up with whether additional testing may be needed.

## 2020-12-25 NOTE — Progress Notes (Addendum)
 GENETIC TEST RESULTS  Patient Name: Francisco Frank Patient Age: 70 y.o. Encounter Date: 12/25/2020  Referring Provider: Lonna Cobb, NP  HPI:  Mr. Golda was previously seen in the [facility] due to a personal history of cancer and concerns regarding a hereditary predisposition to cancer. Please refer to our prior cancer genetics clinic note for more information regarding our discussion, assessment and recommendations, at the time. Mr. Passey recent genetic test results were disclosed to him, as were recommendations warranted by these results. These results and recommendations are discussed in more detail below.  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  No other pathogenic variants detected in [facility] CustomNext-Cancer +RNAinsight Panel.  The report date is December 15, 2020.   The CustomNext-Cancer +RNAinsight Panel offered by [facility] and includes sequencing and rearrangement analysis for the following 91 genes: AIP, ALK, APC, ATM, AXIN2, BAP1, BARD1, BLM, BMPR1A, BRCA1, BRCA2, BRIP1, CDC73, CDH1, CDK4, CDKN1B, CDKN2A, CHEK2, CTNNA1, DICER1, FANCC, FH, FLCN, GALNT12, KIF1B, LZTR1, MAX, MEN1, MET, MLH1, MRE11A, MSH2, MSH3, MSH6, MUTYH, NBN, NF1, NF2, NTHL1, PALB2, PHOX2B, PMS2, POT1, PRKAR1A, PTCH1, PTEN, RAD50, RAD51C, RAD51D, RB1, RECQL, RET, SDHA, SDHAF2, SDHB, SDHC, SDHD, SMAD4, SMARCA4, SMARCB1, SMARCE1, STK11, SUFU, TMEM127, TP53, TSC1, TSC2, VHL and XRCC2 (sequencing and deletion/duplication); CASR, CFTR, CPA1, CTRC, EGFR, EGLN1, FAM175A, HOXB13, KIT,  MITF, MLH3, PALLD, PDGFRA, POLD1, POLE, PRSS1, RINT1, RPS20, SPINK1 and TERT (sequencing only); EPCAM and GREM1 (deletion/duplication only). RNA data is routinely analyzed for use in variant interpretation for all genes.   01/01/2021 -  Chemotherapy   Patient is on Treatment Plan : PANCREAS Modified FOLFIRINOX q14d x 4 cycles       FAMILY HISTORY:  We obtained a detailed, 4-generation family history.  Significant diagnoses are listed below:      Family History  Problem Relation Age of Onset   Leukemia Mother          dx 69s   Colon polyps Maternal Grandmother          unknown #   Prostate cancer Paternal Grandfather          dx after 71      Mr. Kleen is unaware of previous family history of genetic testing for hereditary cancer risks. Patient's maternal ancestors are of [place], [place], and [place] descent, and paternal ancestors are of [place] and Native American descent. There is no reported Ashkenazi Jewish ancestry. There is no known consanguinity.  GENETIC TEST RESULTS: At the time of Mr. Fundora visit, we recommended he pursue genetic testing of the Custom-CancerNext +RNAinsight Panel. This test performed at [facility]. The CancerNext-Expanded gene panel offered by [facility] and includes sequencing, rearrangement, and RNA analysis for the following 77 genes: AIP, ALK, APC, ATM, AXIN2, BAP1, BARD1, BLM, BMPR1A, BRCA1, BRCA2, BRIP1, CDC73, CDH1, CDK4, CDKN1B, CDKN2A, CHEK2, CTNNA1, DICER1, FANCC, FH, FLCN, GALNT12, KIF1B, LZTR1, MAX, MEN1, MET, MLH1, MSH2, MSH3, MSH6, MUTYH, NBN, NF1, NF2, NTHL1, PALB2, PHOX2B, PMS2, POT1, PRKAR1A, PTCH1, PTEN, RAD51C, RAD51D, RB1, RECQL, RET, SDHA, SDHAF2, SDHB, SDHC, SDHD, SMAD4, SMARCA4, SMARCB1, SMARCE1, STK11, SUFU,  TMEM127, TP53, TSC1, TSC2, VHL and XRCC2 (sequencing and deletion/duplication); EGFR, EGLN1, HOXB13, KIT, MITF, PDGFRA, POLD1, and POLE (sequencing only); EPCAM and GREM1 (deletion/duplication only).   Mr. Husted was called  today with his genetic test results.   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  Since Mr. Juba has only one mutation in CFTR, he is NOT affected with cystic fibrosis, but instead is a carrier.   A copy of the test report will be scanned into Epic under the Molecular Pathology section of the Results Review tab.     Mr. Calvillo result does not explain his personal history of cancer.  We discussed with Mr. Devol that because current genetic testing is not perfect, it is possible there may be a gene mutation in one of these genes that current testing cannot detect, but that chance is small.  We also discussed that there could be another gene that has not yet been discovered, or that we have not yet tested, that is responsible for the cancer diagnoses in the family. It is also possible there is a hereditary cause for the cancer in the family that Mr. Brach did not inherit and therefore was not identified in his testing.  Therefore, it is important to remain in touch with cancer genetics in the future so that we can continue to offer Mr. Scull the most up to date genetic testing.   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  At this time, it is unknown if this variant is associated with increased cancer risk or if this is a normal finding, but most variants such as this get reclassified to being inconsequential. It should not be used to make medical management decisions. With time, we suspect the lab will determine the significance of this variant, if any. If we do learn more about it, we will try to contact Mr. Guster to discuss it further. However, it is important to stay in touch with [place] periodically and keep the address and phone number up to date.  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   Amended report date is 06/14/2023.   CFTR GENE:  We discussed that Mr. 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   We discussed that having two mutations in the CFTR gene can result in cystic fibrosis, an autosomal recessive condition that classically affects the lungs, pancreas, and digestive tract. In addition to classic cystic fibrosis, there are variant types of cystic fibrosis, called CFTR-related disorders (CFTR-RD), which have less severe symptoms including milder respiratory problems, male infertility, and/or acute recurrent or chronic pancreatitis. In some males, infertility caused by congenital bilateral absence of the vas deferens (CBAVD) may be the only clinical finding. Due to the autosomal recessive inheritance, two pathogenic variants (one on each chromosome) are required to cause disease.  Because Mr. Kittel only has one pathogenic variant, he is not affected with a CFTR-related disorder, but is instead a carrier.The risk for chronic pancreatitis can be increased in CFTR carriers, although it is unknown if this risk is increased by the reduced-penetrance mutation that was detected in Mr. Branam.  We discussed with Mr. Glockner that the majority of CFTR carriers do not have chronic pancreatitis.    ADDITIONAL GENETIC TESTING: We discussed with Mr. Wojnarowski that his  genetic testing was fairly extensive.  If there are genes identified to increase cancer risk that can be analyzed in the future, we would be happy to discuss and coordinate this testing at that time.    CANCER SCREENING RECOMMENDATIONS: Based on Mr. Sidle genetic test result, we have not identified a hereditary cause for his personal history of cancer at this time. Most cancers happen by chance and this negative test suggests that his personal history of cancer may fall into this category.    While reassuring, this does not definitively  rule out a hereditary predisposition to cancer. It is still possible that there could be genetic mutations that are undetectable by current technology. There could be genetic mutations in genes that have not been tested or identified to increase cancer risk.  Therefore, it is recommended he continue to follow the cancer management and screening guidelines provided by his oncology and primary healthcare provider.   An individual's cancer risk and medical management are not determined by genetic test results alone. Overall cancer risk assessment incorporates additional factors, including personal medical history, family history, and any available genetic information that may result in a personalized plan for cancer prevention and surveillance  RECOMMENDATIONS FOR FAMILY MEMBERS:  Individuals in this family might be at some increased risk of developing cancer, over the general population risk, simply due to the family history of cancer.  We recommended females in this family have a yearly mammogram beginning at age 29, or 24 years younger than the earliest onset of cancer, an annual clinical breast exam, and perform monthly breast self-exams. Females in this family should also have a gynecological exam as recommended by their primary provider. All family members should have a colonoscopy by age 50, or earlier, as recommended by their provider.  Family members should notify their providers of the family history of pancreatic cancer.   We also discussed that individuals in Mr. Boy family have an increased chance of also being carriers for CFTR.  First degree relatives have a 50% chance of also being carriers for cystric fibrosis.  Those of reproductive age in the family may wish to have genetic testing for this family variant to better estimate their chances of having a child affected by CFTR-related disorders.   FOLLOW-UP: Lastly, we discussed with Mr. Greenleaf that cancer genetics is a rapidly advancing  field and it is possible that new genetic tests will be appropriate for him and/or his family members in the future. We encouraged him to remain in contact with cancer genetics on an annual basis so we can update his personal and family histories and let him know of advances in cancer genetics that may benefit this family.   Our contact number was provided. Mr. Tabbert questions were answered to his satisfaction, and he knows he is welcome to call us at anytime with additional questions or concerns.      Tonda Wiederhold M. Rennie Plowman, MS, [facility] Certified Education officer, [email or web address] (P) [phone]

## 2020-12-25 NOTE — Progress Notes (Unsigned)
Lab orders entered for upcoming treatment

## 2020-12-26 ENCOUNTER — Inpatient Hospital Stay: Payer: Medicare Other

## 2020-12-28 ENCOUNTER — Other Ambulatory Visit: Payer: Self-pay | Admitting: Oncology

## 2020-12-28 DIAGNOSIS — C250 Malignant neoplasm of head of pancreas: Secondary | ICD-10-CM

## 2020-12-29 NOTE — Progress Notes (Signed)
Pt. Need orders for upcoming procedure.PAT and labs on 12/30/20.

## 2020-12-29 NOTE — Patient Instructions (Signed)
DUE TO COVID-19 ONLY ONE VISITOR IS ALLOWED TO COME WITH YOU AND STAY IN THE WAITING ROOM ONLY DURING PRE OP AND PROCEDURE.   **NO VISITORS ARE ALLOWED IN THE SHORT STAY AREA OR RECOVERY ROOM!!**  IF YOU WILL BE ADMITTED INTO THE HOSPITAL YOU ARE ALLOWED ONLY TWO SUPPORT PEOPLE DURING VISITATION HOURS ONLY (10AM -8PM)   The support person(s) may change daily. The support person(s) must pass our screening, gel in and out, and wear a mask at all times, including in the patient's room. Patients must also wear a mask when staff or their support person are in the room.  No visitors under the age of 49. Any visitor under the age of 75 must be accompanied by an adult.     Your procedure is scheduled on: 12/31/20   Report to [facility] Main Entrance    Report to admitting at: 6:30 AM   Call this number if you have problems the morning of surgery [phone]   Do not eat food :After Midnight.   May have liquids until: 5:30 AM    day of surgery  CLEAR LIQUID DIET  Foods Allowed                                                                     Foods Excluded  Water, Black Coffee and tea, regular and decaf                             liquids that you cannot  Plain Jell-O in any flavor  (No red)                                           see through such as: Fruit ices (not with fruit pulp)                                     milk, soups, orange juice              Iced Popsicles (No red)                                    All solid food                                   Apple juices Sports drinks like Gatorade (No red) Lightly seasoned clear broth or consume(fat free) Sugar,  Sample Menu Breakfast                                Lunch                                     Supper Cranberry juice  Beef broth                            Chicken broth Jell-O                                     Grape juice                           Apple juice Coffee or tea                         Jell-O                                      Popsicle                                                Coffee or tea                        Coffee or tea    Oral Hygiene is also important to reduce your risk of infection.                                    Remember - BRUSH YOUR TEETH THE MORNING OF SURGERY WITH YOUR REGULAR TOOTHPASTE   Do NOT smoke after Midnight   Take these medicines the morning of surgery with A SIP OF WATER:hyoscymine.   How to Manage Your Diabetes Before and After Surgery  Why is it important to control my blood sugar before and after surgery? Improving blood sugar levels before and after surgery helps healing and can limit problems. A way of improving blood sugar control is eating a healthy diet by:  Eating less sugar and carbohydrates  Increasing activity/exercise  Talking with your doctor about reaching your blood sugar goals High blood sugars (greater than 180 mg/dL) can raise your risk of infections and slow your recovery, so you will need to focus on controlling your diabetes during the weeks before surgery. Make sure that the doctor who takes care of your diabetes knows about your planned surgery including the date and location.  How do I manage my blood sugar before surgery? Check your blood sugar at least 4 times a day, starting 2 days before surgery, to make sure that the level is not too high or low. Check your blood sugar the morning of your surgery when you wake up and every 2 hours until you get to the Short Stay unit. If your blood sugar is less than 70 mg/dL, you will need to treat for low blood sugar: Do not take insulin. Treat a low blood sugar (less than 70 mg/dL) with  cup of clear juice (cranberry or apple), 4 glucose tablets, OR glucose gel. Recheck blood sugar in 15 minutes after treatment (to make sure it is greater than 70 mg/dL). If your blood sugar is not greater than 70 mg/dL on recheck, call [phone] for further  instructions. Report your blood sugar to the short stay nurse  when you get to Short Stay.  If you are admitted to the hospital after surgery: Your blood sugar will be checked by the staff and you will probably be given insulin after surgery (instead of oral diabetes medicines) to make sure you have good blood sugar levels. The goal for blood sugar control after surgery is 80-180 mg/dL.   WHAT DO I DO ABOUT MY DIABETES MEDICATION?  Do not take oral diabetes medicines (pills) the morning of surgery.  THE DAY BEFORE SURGERY, take Novolog insulin as usual for breakfast and lunch.DO NOT take bedtime dose.Take ONLY half of the basaglar insulin at bedtime.      THE MORNING OF SURGERY, If your CBG is greater than 220 mg/dL, you may take  of your sliding scale  (correction) dose of insulin.  DO NOT TAKE ANY ORAL DIABETIC MEDICATIONS DAY OF YOUR SURGERY                              You may not have any metal on your body including hair pins, jewelry, and body piercing             Do not wear  lotions, powders, perfumes/cologne, or deodorant              Men may shave face and neck.   Do not bring valuables to the hospital. [place].   Contacts, dentures or bridgework may not be worn into surgery.    Patients discharged on the day of surgery will not be allowed to drive home.   Special Instructions: Bring a copy of your healthcare power of attorney and living will documents         the day of surgery if you haven't scanned them before.              Please read over the following fact sheets you were given: IF YOU HAVE QUESTIONS ABOUT YOUR PRE-OP INSTRUCTIONS PLEASE CALL [phone]   [facility] - Preparing for Surgery Before surgery, you can play an important role.  Because skin is not sterile, your skin needs to be as free of germs as possible.  You can reduce the number of germs on your skin by washing with CHG (chlorahexidine  gluconate) soap before surgery.  CHG is an antiseptic cleaner which kills germs and bonds with the skin to continue killing germs even after washing. Please DO NOT use if you have an allergy to CHG or antibacterial soaps.  If your skin becomes reddened/irritated stop using the CHG and inform your nurse when you arrive at Short Stay. Do not shave (including legs and underarms) for at least 48 hours prior to the first CHG shower.  You may shave your face/neck. Please follow these instructions carefully:  1.  Shower with CHG Soap the night before surgery and the  morning of Surgery.  2.  If you choose to wash your hair, wash your hair first as usual with your  normal  shampoo.  3.  After you shampoo, rinse your hair and body thoroughly to remove the  shampoo.                           4.  Use CHG as you would any other liquid soap.  You can apply chg directly  to the skin and wash                       Gently with a scrungie or clean washcloth.  5.  Apply the CHG Soap to your body ONLY FROM THE NECK DOWN.   Do not use on face/ open                           Wound or open sores. Avoid contact with eyes, ears mouth and genitals (private parts).                       Wash face,  Genitals (private parts) with your normal soap.             6.  Wash thoroughly, paying special attention to the area where your surgery  will be performed.  7.  Thoroughly rinse your body with warm water from the neck down.  8.  DO NOT shower/wash with your normal soap after using and rinsing off  the CHG Soap.                9.  Pat yourself dry with a clean towel.            10.  Wear clean pajamas.            11.  Place clean sheets on your bed the night of your first shower and do not  sleep with pets. Day of Surgery : Do not apply any lotions/deodorants the morning of surgery.  Please wear clean clothes to the hospital/surgery center.  FAILURE TO FOLLOW THESE INSTRUCTIONS MAY RESULT IN THE CANCELLATION OF YOUR  SURGERY PATIENT SIGNATURE_________________________________  NURSE SIGNATURE__________________________________  ________________________________________________________________________

## 2020-12-30 ENCOUNTER — Other Ambulatory Visit: Payer: Self-pay

## 2020-12-30 ENCOUNTER — Encounter (HOSPITAL_COMMUNITY)
Admission: RE | Admit: 2020-12-30 | Discharge: 2020-12-30 | Disposition: A | Discharge status: Home / Self Care | Payer: Medicare Other | Source: Ambulatory Visit | Attending: Surgery | Admitting: Surgery

## 2020-12-30 ENCOUNTER — Encounter (HOSPITAL_COMMUNITY): Payer: Self-pay

## 2020-12-30 ENCOUNTER — Ambulatory Visit: Payer: Self-pay | Admitting: Surgery

## 2020-12-30 ENCOUNTER — Encounter: Payer: Self-pay | Admitting: Oncology

## 2020-12-30 DIAGNOSIS — Z01812 Encounter for preprocedural laboratory examination: Secondary | ICD-10-CM | POA: Diagnosis not present

## 2020-12-30 DIAGNOSIS — E119 Type 2 diabetes mellitus without complications: Secondary | ICD-10-CM | POA: Diagnosis not present

## 2020-12-30 HISTORY — DX: Malignant (primary) neoplasm, unspecified: C80.1

## 2020-12-30 HISTORY — DX: Unspecified osteoarthritis, unspecified site: M19.90

## 2020-12-30 LAB — CBC
HCT: 31 % — ABNORMAL LOW (ref 39.0–52.0)
Hemoglobin: 10.1 g/dL — ABNORMAL LOW (ref 13.0–17.0)
MCH: 30.4 pg (ref 26.0–34.0)
MCHC: 32.6 g/dL (ref 30.0–36.0)
MCV: 93.4 fL (ref 80.0–100.0)
Platelets: 509 10*3/uL — ABNORMAL HIGH (ref 150–400)
RBC: 3.32 MIL/uL — ABNORMAL LOW (ref 4.22–5.81)
RDW: 17.2 % — ABNORMAL HIGH (ref 11.5–15.5)
WBC: 9.9 10*3/uL (ref 4.0–10.5)
nRBC: 0 % (ref 0.0–0.2)

## 2020-12-30 LAB — GLUCOSE, CAPILLARY: Glucose-Capillary: 265 mg/dL — ABNORMAL HIGH (ref 70–99)

## 2020-12-30 NOTE — Progress Notes (Signed)
COVID Vaccine Completed: Yes Date COVID Vaccine completed: 12/2019. X 3 COVID vaccine manufacturer:    Moderna   COVID Test: N/A  PCP - Dr. Kennieth Rad Cardiologist -   Chest x-ray - 12/12/20 EKG - 12/15/20 Stress Test -  ECHO -  Cardiac Cath -  Pacemaker/ICD device last checked: A1C: 9.6: 11/09/20 Sleep Study -  CPAP -   Fasting Blood Sugar - 200's Checks Blood Sugar ___4__ times a day  Blood Thinner Instructions: Aspirin Instructions: Last Dose:  Anesthesia review: Hx: HTN,DIA,OSA(NO CPAP)  Patient denies shortness of breath, fever, cough and chest pain at PAT appointment   Patient verbalized understanding of instructions that were given to them at the PAT appointment. Patient was also instructed that they will need to review over the PAT instructions again at home before surgery.

## 2020-12-31 ENCOUNTER — Encounter (HOSPITAL_COMMUNITY): Payer: Self-pay | Admitting: Surgery

## 2020-12-31 ENCOUNTER — Ambulatory Visit (HOSPITAL_COMMUNITY): Payer: Medicare Other

## 2020-12-31 ENCOUNTER — Ambulatory Visit (HOSPITAL_COMMUNITY): Payer: Medicare Other | Admitting: Anesthesiology

## 2020-12-31 ENCOUNTER — Ambulatory Visit (HOSPITAL_COMMUNITY)
Admission: RE | Admit: 2020-12-31 | Discharge: 2020-12-31 | Disposition: A | Discharge status: Home / Self Care | Payer: Medicare Other | Source: Ambulatory Visit | Attending: Surgery | Admitting: Surgery

## 2020-12-31 ENCOUNTER — Ambulatory Visit: Payer: Medicare Other

## 2020-12-31 ENCOUNTER — Encounter (HOSPITAL_COMMUNITY)
Admission: RE | Disposition: A | Discharge status: Home / Self Care | Payer: Self-pay | Source: Ambulatory Visit | Attending: Surgery

## 2020-12-31 DIAGNOSIS — C250 Malignant neoplasm of head of pancreas: Secondary | ICD-10-CM | POA: Diagnosis present

## 2020-12-31 DIAGNOSIS — Z885 Allergy status to narcotic agent status: Secondary | ICD-10-CM | POA: Diagnosis not present

## 2020-12-31 DIAGNOSIS — Z8042 Family history of malignant neoplasm of prostate: Secondary | ICD-10-CM | POA: Diagnosis not present

## 2020-12-31 DIAGNOSIS — Z95828 Presence of other vascular implants and grafts: Secondary | ICD-10-CM

## 2020-12-31 DIAGNOSIS — Z794 Long term (current) use of insulin: Secondary | ICD-10-CM | POA: Diagnosis not present

## 2020-12-31 DIAGNOSIS — F17220 Nicotine dependence, chewing tobacco, uncomplicated: Secondary | ICD-10-CM | POA: Insufficient documentation

## 2020-12-31 DIAGNOSIS — Z9049 Acquired absence of other specified parts of digestive tract: Secondary | ICD-10-CM | POA: Diagnosis not present

## 2020-12-31 DIAGNOSIS — Z79899 Other long term (current) drug therapy: Secondary | ICD-10-CM | POA: Insufficient documentation

## 2020-12-31 DIAGNOSIS — Z806 Family history of leukemia: Secondary | ICD-10-CM | POA: Diagnosis not present

## 2020-12-31 HISTORY — PX: PORTACATH PLACEMENT: SHX2246

## 2020-12-31 LAB — GLUCOSE, CAPILLARY
Glucose-Capillary: 173 mg/dL — ABNORMAL HIGH (ref 70–99)
Glucose-Capillary: 175 mg/dL — ABNORMAL HIGH (ref 70–99)

## 2020-12-31 IMAGING — DX DG CHEST 1V PORT
1 series · 1 of 1 positions shown · non-contrast
Comparison: [DATE]

CLINICAL DATA: Status post Port-A-Cath placement

EXAM:
PORTABLE CHEST 1 VIEW

[chest ap]
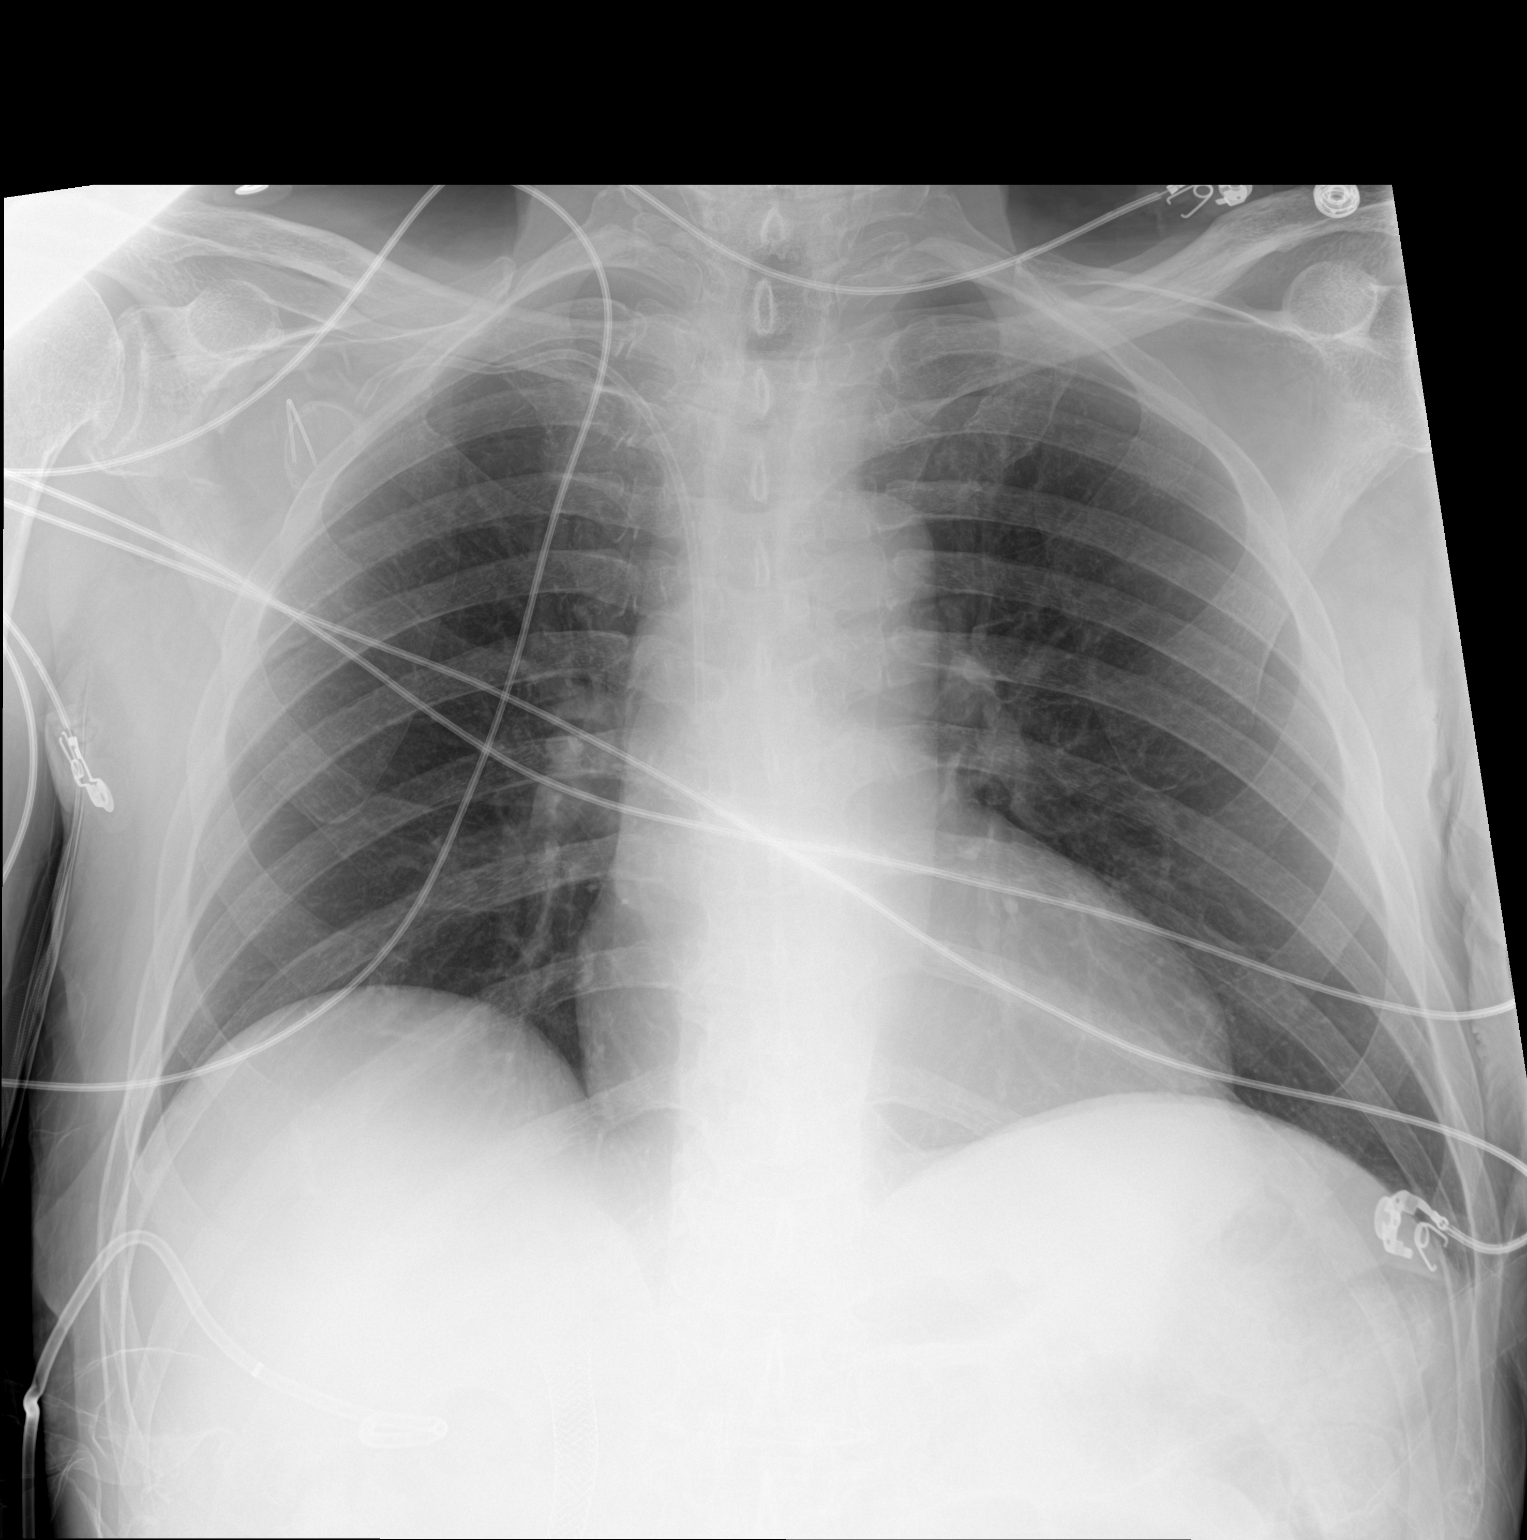

[1 of 1 positions shown; findings below may reference images not displayed]

FINDINGS: Right chest wall port a catheter is noted with tip in the projection
of the SVC. No pneumothorax identified. Heart size and mediastinal
contours are unremarkable. Lungs are clear. No pleural effusion or
edema. No airspace densities.
IMPRESSION: Status post right chest wall port a catheter placement without
complication.

## 2020-12-31 SURGERY — INSERTION, TUNNELED CENTRAL VENOUS DEVICE, WITH PORT
Anesthesia: General

## 2020-12-31 MED ORDER — EPHEDRINE 5 MG/ML INJ
INTRAVENOUS | Status: AC
  Filled 2020-12-31: qty 5

## 2020-12-31 MED ORDER — LIDOCAINE 2% (20 MG/ML) 5 ML SYRINGE
INTRAMUSCULAR | Status: DC | PRN
  Administered 2020-12-31: 60 mg via INTRAVENOUS

## 2020-12-31 MED ORDER — HEPARIN SOD (PORK) LOCK FLUSH 100 UNIT/ML IV SOLN
INTRAVENOUS | Status: AC
  Filled 2020-12-31: qty 5

## 2020-12-31 MED ORDER — PROPOFOL 10 MG/ML IV BOLUS
INTRAVENOUS | Status: AC
  Filled 2020-12-31: qty 20

## 2020-12-31 MED ORDER — BUPIVACAINE-EPINEPHRINE 0.25% -1:200000 IJ SOLN
INTRAMUSCULAR | Status: DC | PRN
  Administered 2020-12-31: 10 mL

## 2020-12-31 MED ORDER — MIDAZOLAM HCL 2 MG/2ML IJ SOLN
INTRAMUSCULAR | Status: DC | PRN
  Administered 2020-12-31: 2 mg via INTRAVENOUS

## 2020-12-31 MED ORDER — ORAL CARE MOUTH RINSE: 15.0000 mL | Freq: Once | OROMUCOSAL | Status: AC

## 2020-12-31 MED ORDER — HEPARIN 6000 UNIT IRRIGATION SOLUTION
Status: DC | PRN
  Administered 2020-12-31: 1

## 2020-12-31 MED ORDER — FENTANYL CITRATE (PF) 100 MCG/2ML IJ SOLN
INTRAMUSCULAR | Status: DC | PRN
  Administered 2020-12-31 (×4): 25 ug via INTRAVENOUS

## 2020-12-31 MED ORDER — FENTANYL CITRATE PF 50 MCG/ML IJ SOSY: 25.0000 ug | PREFILLED_SYRINGE | INTRAMUSCULAR | Status: DC | PRN

## 2020-12-31 MED ORDER — EPHEDRINE SULFATE-NACL 50-0.9 MG/10ML-% IV SOSY
PREFILLED_SYRINGE | INTRAVENOUS | Status: DC | PRN
  Administered 2020-12-31: 5 mg via INTRAVENOUS
  Administered 2020-12-31: 10 mg via INTRAVENOUS
  Administered 2020-12-31: 5 mg via INTRAVENOUS

## 2020-12-31 MED ORDER — FENTANYL CITRATE (PF) 100 MCG/2ML IJ SOLN
INTRAMUSCULAR | Status: AC
  Filled 2020-12-31: qty 2

## 2020-12-31 MED ORDER — ONDANSETRON HCL 4 MG/2ML IJ SOLN
INTRAMUSCULAR | Status: DC | PRN
  Administered 2020-12-31: 4 mg via INTRAVENOUS

## 2020-12-31 MED ORDER — HEPARIN 6000 UNIT IRRIGATION SOLUTION
Freq: Once | Status: DC
  Filled 2020-12-31: qty 500

## 2020-12-31 MED ORDER — HEPARIN SOD (PORK) LOCK FLUSH 100 UNIT/ML IV SOLN
INTRAVENOUS | Status: DC | PRN
  Administered 2020-12-31: 500 [IU] via INTRAVENOUS

## 2020-12-31 MED ORDER — PHENYLEPHRINE 40 MCG/ML (10ML) SYRINGE FOR IV PUSH (FOR BLOOD PRESSURE SUPPORT)
PREFILLED_SYRINGE | INTRAVENOUS | Status: DC | PRN
  Administered 2020-12-31 (×2): 120 ug via INTRAVENOUS
  Administered 2020-12-31: 40 ug via INTRAVENOUS
  Administered 2020-12-31: 120 ug via INTRAVENOUS

## 2020-12-31 MED ORDER — MIDAZOLAM HCL 2 MG/2ML IJ SOLN
INTRAMUSCULAR | Status: AC
  Filled 2020-12-31: qty 2

## 2020-12-31 MED ORDER — LACTATED RINGERS IV SOLN: INTRAVENOUS | Status: DC

## 2020-12-31 MED ORDER — ACETAMINOPHEN 500 MG PO TABS: 1000.0000 mg | ORAL_TABLET | Freq: Once | ORAL | Status: DC | PRN

## 2020-12-31 MED ORDER — ACETAMINOPHEN 160 MG/5ML PO SOLN: 1000.0000 mg | Freq: Once | ORAL | Status: DC | PRN

## 2020-12-31 MED ORDER — BUPIVACAINE-EPINEPHRINE (PF) 0.25% -1:200000 IJ SOLN
INTRAMUSCULAR | Status: AC
  Filled 2020-12-31: qty 30

## 2020-12-31 MED ORDER — ONDANSETRON HCL 4 MG/2ML IJ SOLN
INTRAMUSCULAR | Status: AC
  Filled 2020-12-31: qty 2

## 2020-12-31 MED ORDER — ACETAMINOPHEN 10 MG/ML IV SOLN: 1000.0000 mg | Freq: Once | INTRAVENOUS | Status: DC | PRN

## 2020-12-31 MED ORDER — PROPOFOL 10 MG/ML IV BOLUS
INTRAVENOUS | Status: DC | PRN
  Administered 2020-12-31: 130 mg via INTRAVENOUS

## 2020-12-31 MED ORDER — PHENYLEPHRINE 40 MCG/ML (10ML) SYRINGE FOR IV PUSH (FOR BLOOD PRESSURE SUPPORT)
PREFILLED_SYRINGE | INTRAVENOUS | Status: AC
  Filled 2020-12-31: qty 10

## 2020-12-31 MED ORDER — CEFAZOLIN SODIUM-DEXTROSE 2-4 GM/100ML-% IV SOLN
2.0000 g | INTRAVENOUS | Status: AC
  Administered 2020-12-31: 2 g via INTRAVENOUS
  Filled 2020-12-31: qty 100

## 2020-12-31 MED ORDER — CHLORHEXIDINE GLUCONATE 0.12 % MT SOLN
15.0000 mL | Freq: Once | OROMUCOSAL | Status: AC
  Administered 2020-12-31: 15 mL via OROMUCOSAL

## 2020-12-31 MED ORDER — LIDOCAINE HCL (PF) 2 % IJ SOLN
INTRAMUSCULAR | Status: AC
  Filled 2020-12-31: qty 5

## 2020-12-31 SURGICAL SUPPLY — 45 items
ADH SKN CLS APL DERMABOND .7 (GAUZE/BANDAGES/DRESSINGS) ×1
APL PRP STRL LF DISP 70% ISPRP (MISCELLANEOUS) ×1
APL SKNCLS STERI-STRIP NONHPOA (GAUZE/BANDAGES/DRESSINGS) ×1
BAG COUNTER SPONGE SURGICOUNT (BAG) IMPLANT
BAG DECANTER FOR FLEXI CONT (MISCELLANEOUS) ×2 IMPLANT
BAG SPNG CNTER NS LX DISP (BAG)
BENZOIN TINCTURE PRP APPL 2/3 (GAUZE/BANDAGES/DRESSINGS) ×2 IMPLANT
BLADE SURG 15 STRL LF DISP TIS (BLADE) ×1 IMPLANT
BLADE SURG 15 STRL SS (BLADE) ×2
BLADE SURG SZ11 CARB STEEL (BLADE) ×2 IMPLANT
CHLORAPREP W/TINT 26 (MISCELLANEOUS) ×2 IMPLANT
COVER SURGICAL LIGHT HANDLE (MISCELLANEOUS) ×2 IMPLANT
DECANTER SPIKE VIAL GLASS SM (MISCELLANEOUS) ×2 IMPLANT
DERMABOND ADVANCED (GAUZE/BANDAGES/DRESSINGS) ×1
DERMABOND ADVANCED .7 DNX12 (GAUZE/BANDAGES/DRESSINGS) ×1 IMPLANT
DRAPE C-ARM 42X120 X-RAY (DRAPES) ×2 IMPLANT
DRAPE LAPAROSCOPIC ABDOMINAL (DRAPES) ×2 IMPLANT
DRSG TEGADERM 4X4.75 (GAUZE/BANDAGES/DRESSINGS) ×2 IMPLANT
ELECT REM PT RETURN 15FT ADLT (MISCELLANEOUS) ×2 IMPLANT
GAUZE 4X4 16PLY ~~LOC~~+RFID DBL (SPONGE) ×2 IMPLANT
GAUZE SPONGE 2X2 8PLY STRL LF (GAUZE/BANDAGES/DRESSINGS) ×1 IMPLANT
GAUZE SPONGE 4X4 12PLY STRL (GAUZE/BANDAGES/DRESSINGS) ×2 IMPLANT
GLOVE SURG POLYISO LF SZ5.5 (GLOVE) ×2 IMPLANT
GLOVE SURG POLYISO LF SZ6 (GLOVE) ×2 IMPLANT
GOWN STRL REUS W/TWL LRG LVL3 (GOWN DISPOSABLE) ×2 IMPLANT
GOWN STRL REUS W/TWL XL LVL3 (GOWN DISPOSABLE) ×2 IMPLANT
KIT BASIN OR (CUSTOM PROCEDURE TRAY) ×2 IMPLANT
KIT PORT POWER 8FR ISP CVUE (Port) ×2 IMPLANT
KIT TURNOVER KIT A (KITS) ×2 IMPLANT
NEEDLE HYPO 22GX1.5 SAFETY (NEEDLE) ×2 IMPLANT
PACK BASIC VI WITH GOWN DISP (CUSTOM PROCEDURE TRAY) ×2 IMPLANT
PENCIL SMOKE EVACUATOR (MISCELLANEOUS) IMPLANT
SPONGE GAUZE 2X2 STER 10/PKG (GAUZE/BANDAGES/DRESSINGS) ×1
STRIP CLOSURE SKIN 1/4X4 (GAUZE/BANDAGES/DRESSINGS) ×2 IMPLANT
SUT MNCRL AB 4-0 PS2 18 (SUTURE) ×2 IMPLANT
SUT PROLENE 2 0 SH DA (SUTURE) ×4 IMPLANT
SUT VIC AB 2-0 SH 18 (SUTURE) IMPLANT
SUT VIC AB 2-0 SH 27 (SUTURE)
SUT VIC AB 2-0 SH 27X BRD (SUTURE) IMPLANT
SUT VIC AB 3-0 SH 27 (SUTURE) ×2
SUT VIC AB 3-0 SH 27X BRD (SUTURE) ×1 IMPLANT
SYR 10ML LL (SYRINGE) ×2 IMPLANT
SYR 20ML LL LF (SYRINGE) ×2 IMPLANT
TOWEL OR 17X26 10 PK STRL BLUE (TOWEL DISPOSABLE) ×2 IMPLANT
TOWEL OR NON WOVEN STRL DISP B (DISPOSABLE) ×2 IMPLANT

## 2020-12-31 NOTE — H&P (Signed)
Francisco Frank is an 70 y.o. male.    HPI: Francisco Frank is a 70 yo male who was recently diagnosed with pancreatic cancer after presenting with obstructive jaundice. He was recently admitted with abdominal pain and ultimately underwent placement of a percutaneous cholecystostomy. Today he reports his pain has resolved. He is eating without difficulty. He is scheduled to begin chemotherapy tomorrow.  Past Medical History:  Diagnosis Date   Arthritis    Back pain    Cancer ([place])    Diabetes mellitus without complication ([place])    Hearing deficit    Hyperlipemia    Hypertension    Insomnia    Sleep apnea     Past Surgical History:  Procedure Laterality Date   BIOPSY  11/11/2020   Procedure: BIOPSY;  Surgeon: Rush Landmark Telford Nab., MD;  Location: [facility];  Service: Gastroenterology;;   CARDIAC CATHETERIZATION     ENDOSCOPIC RETROGRADE CHOLANGIOPANCREATOGRAPHY (ERCP) WITH PROPOFOL N/A 11/11/2020   Procedure: ENDOSCOPIC RETROGRADE CHOLANGIOPANCREATOGRAPHY (ERCP) WITH PROPOFOL;  Surgeon: Irving Copas., MD;  Location: [facility];  Service: Gastroenterology;  Laterality: N/A;   ESOPHAGOGASTRODUODENOSCOPY (EGD) WITH PROPOFOL N/A 11/11/2020   Procedure: ESOPHAGOGASTRODUODENOSCOPY (EGD) WITH PROPOFOL;  Surgeon: Rush Landmark Telford Nab., MD;  Location: [facility];  Service: Gastroenterology;  Laterality: N/A;   EUS N/A 11/11/2020   Procedure: UPPER ENDOSCOPIC ULTRASOUND (EUS) RADIAL;  Surgeon: Irving Copas., MD;  Location: [facility];  Service: Gastroenterology;  Laterality: N/A;   FINE NEEDLE ASPIRATION  11/11/2020   Procedure: FINE NEEDLE ASPIRATION;  Surgeon: Rush Landmark Telford Nab., MD;  Location: [facility];  Service: Gastroenterology;;   HEMOSTASIS CONTROL  11/11/2020   Procedure: HEMOSTASIS CONTROL;  Surgeon: Irving Copas., MD;  Location: [facility];  Service: Gastroenterology;;  epi injection   IR BILIARY STENT(S) EXISTING ACCESS INC DILATION CATH  EXCHANGE  11/19/2020   IR CHOLANGIOGRAM EXISTING TUBE  12/11/2020   IR CONVERT BILIARY DRAIN TO INT EXT BILIARY DRAIN  11/28/2020   IR PERC CHOLECYSTOSTOMY  12/17/2020   IR PERCUTANEOUS TRANSHEPATIC CHOLANGIOGRAM  11/12/2020   REPLACEMENT TOTAL KNEE     ROTATOR CUFF REPAIR     SPHINCTEROTOMY  11/11/2020   Procedure: SPHINCTEROTOMY;  Surgeon: Mansouraty, Telford Nab., MD;  Location: [facility];  Service: Gastroenterology;;    Family History  Problem Relation Age of Onset   Leukemia Mother        dx 74s   Cirrhosis Father    Alcohol abuse Father    Colon polyps Maternal Grandmother        unknown #   Heart disease Paternal Grandfather    Prostate cancer Paternal Grandfather        dx after 9   Esophageal cancer Neg Hx    Pancreatic cancer Neg Hx    Stomach cancer Neg Hx    Social History:  reports that he has never smoked. His smokeless tobacco use includes snuff. He reports that he does not currently use alcohol. He reports that he does not use drugs.  Allergies:  Allergies  Allergen Reactions   Norco [Hydrocodone-Acetaminophen] Itching    Medications Prior to Admission  Medication Sig Dispense Refill   acetaminophen (TYLENOL) 500 MG tablet Take 2 tablets (1,000 mg total) by mouth every 6 (six) hours as needed. 30 tablet 0   atorvastatin (LIPITOR) 40 MG tablet Take 40 mg by mouth daily.     diphenhydramine-acetaminophen (TYLENOL PM) 25-500 MG TABS tablet Take 1 tablet by mouth at bedtime as needed. (Patient taking differently:  Take 1 tablet by mouth at bedtime as needed (sleep).) 14 tablet    feeding supplement (ENSURE ENLIVE / ENSURE PLUS) LIQD Take 237 mLs by mouth 2 (two) times daily between meals. 237 mL 0   hyoscyamine (LEVBID) 0.375 MG 12 hr tablet Take 1 tablet (0.375 mg total) by mouth every 12 (twelve) hours. 60 tablet 0   Insulin Glargine (BASAGLAR KWIKPEN) 100 UNIT/ML Inject 7 Units into the skin at bedtime.     lipase/protease/amylase (CREON) 12000-38000 units CPEP  capsule Take 1 capsule by mouth with breakfast, with lunch, and with evening meal.     Multiple Vitamin (MULTIVITAMIN WITH MINERALS) TABS tablet Take 1 tablet by mouth daily.     NOVOLOG FLEXPEN 100 UNIT/ML FlexPen Inject 2-10 Units into the skin 3 (three) times daily as needed for high blood sugar. If BS is 150-200=2 units, 201-250=4 units, 251-300=6 units, 301-350=8 units, 351-400=10 units.     Nutritional Supplements (,FEEDING SUPPLEMENT, PROSOURCE PLUS) liquid Take 30 mLs by mouth 3 (three) times daily between meals.     traMADol (ULTRAM) 50 MG tablet Take 50 mg by mouth every 4 (four) hours as needed for moderate pain or severe pain.     BD PEN NEEDLE NANO 2ND GEN 32G X 4 MM MISC      loperamide (IMODIUM) 2 MG capsule Take 1 capsule (2 mg total) by mouth as needed for diarrhea or loose stools. 20 capsule 0   methocarbamol (ROBAXIN) 500 MG tablet Take 1 tablet (500 mg total) by mouth 3 (three) times daily as needed for muscle spasms (pain, muscle spasms). 20 tablet 0   prochlorperazine (COMPAZINE) 10 MG tablet Take 1 tablet (10 mg total) by mouth every 6 (six) hours as needed for nausea or vomiting. 20 tablet 0    Results for orders placed or performed during the hospital encounter of 12/31/20 (from the past 48 hour(s))  Glucose, capillary     Status: Abnormal   Collection Time: 12/31/20  6:41 AM  Result Value Ref Range   Glucose-Capillary 173 (H) 70 - 99 mg/dL    Comment: Glucose reference range applies only to samples taken after fasting for at least 8 hours.   No results found.  Review of Systems  Constitutional:  Negative for chills and fever.  Respiratory:  Negative for shortness of breath.   Gastrointestinal:  Negative for abdominal pain.  Psychiatric/Behavioral:  Negative for agitation and confusion.    Blood pressure (!) 160/92, pulse 76, temperature 98.1 F (36.7 C), temperature source Oral, resp. rate 16, height 5\' 8"  (1.727 m), weight 64.4 kg, SpO2 100 %. Physical  Exam Constitutional:      General: He is not in acute distress.    Appearance: Normal appearance.  HENT:     Head: Normocephalic and atraumatic.  Eyes:     General: No scleral icterus.    Conjunctiva/sclera: Conjunctivae normal.  Pulmonary:     Effort: Pulmonary effort is normal. No respiratory distress.  Abdominal:     General: There is no distension.     Palpations: Abdomen is soft.     Comments: RUQ perc chole in place with bilious fluid in bag.  Musculoskeletal:        General: Normal range of motion.     Cervical back: Normal range of motion.  Skin:    General: Skin is warm and dry.     Coloration: Skin is not jaundiced.  Neurological:     General: No focal deficit present.  Mental Status: He is alert and oriented to person, place, and time.  Psychiatric:        Mood and Affect: Mood normal.        Behavior: Behavior normal.        Thought Content: Thought content normal.     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 Proceed to OR today for portacath insertion, will leave accessed. CXR in PACU, plan for discharge home.  Dwan Bolt, MD 12/31/2020, 7:45 AM

## 2020-12-31 NOTE — Discharge Instructions (Signed)
SURGERY DISCHARGE INSTRUCTIONS: PORT-A-CATH PLACEMENT  Activity You may resume your usual activities as tolerated Ok to shower in 48 hours after surgery, but do not bathe or submerge incisions underwater. Do not drive while taking narcotic pain medication.  Wound Care Your port has been left accessed so that it can be used for chemotherapy tomorrow. Please leave the dressing in place and do not shower or get the dressing wet. This will be removed when you complete your first chemotherapy infusion. Do not submerge your incision underwater. Monitor your incision for any new redness, tenderness, or drainage. Do not apply EMLA cream directly over the Dermabond (skin glue).  When to Call us: Fever greater than 100.5 New redness, drainage, or swelling at incision site Severe pain, nausea, or vomiting Shortness of breath, difficulty breathing  For questions or concerns, please call the [facility] Surgery office at [phone].

## 2020-12-31 NOTE — Anesthesia Procedure Notes (Signed)
Procedure Name: LMA Insertion Date/Time: 12/31/2020 8:38 AM Performed by: Lollie Sails, CRNA Pre-anesthesia Checklist: Patient identified, Emergency Drugs available, Suction available, Patient being monitored and Timeout performed Patient Re-evaluated:Patient Re-evaluated prior to induction Oxygen Delivery Method: Circle system utilized Preoxygenation: Pre-oxygenation with 100% oxygen Induction Type: IV induction Ventilation: Mask ventilation without difficulty LMA: LMA inserted LMA Size: 4.0 Number of attempts: 1 Placement Confirmation: positive ETCO2 and breath sounds checked- equal and bilateral Tube secured with: Tape Dental Injury: Teeth and Oropharynx as per pre-operative assessment

## 2020-12-31 NOTE — Anesthesia Preprocedure Evaluation (Signed)
Anesthesia Evaluation  Patient identified by MRN, date of birth, ID band Patient awake    Reviewed: Allergy & Precautions, NPO status , Patient's Chart, lab work & pertinent test results  History of Anesthesia Complications Negative for: history of anesthetic complications  Airway Mallampati: I  TM Distance: >3 FB Neck ROM: Full    Dental  (+) Dental Advisory Given, Teeth Intact   Pulmonary neg shortness of breath, sleep apnea , neg COPD, neg recent URI,    breath sounds clear to auscultation       Cardiovascular hypertension, Pt. on medications (-) angina+ CAD  (-) CHF (-) dysrhythmias  Rhythm:Regular     Neuro/Psych negative neurological ROS  negative psych ROS   GI/Hepatic negative GI ROS, PANCREATIC ADENOCARCINOMA   Endo/Other  diabetes, Insulin Dependent  Renal/GU negative Renal ROSLab Results      Component                Value               Date                      CREATININE               0.94                12/23/2020                Musculoskeletal  (+) Arthritis ,   Abdominal   Peds  Hematology  (+) Blood dyscrasia, anemia , Lab Results      Component                Value               Date                      WBC                      9.9                 12/30/2020                HGB                      10.1 (L)            12/30/2020                HCT                      31.0 (L)            12/30/2020                MCV                      93.4                12/30/2020                PLT                      509 (H)             12/30/2020              Anesthesia Other Findings   Reproductive/Obstetrics  Anesthesia Physical Anesthesia Plan  ASA: 3  Anesthesia Plan: General   Post-op Pain Management:    Induction: Intravenous  PONV Risk Score and Plan: 2 and Ondansetron and Dexamethasone  Airway Management Planned: LMA  Additional  Equipment: None  Intra-op Plan:   Post-operative Plan: Extubation in OR  Informed Consent: I have reviewed the patients History and Physical, chart, labs and discussed the procedure including the risks, benefits and alternatives for the proposed anesthesia with the patient or authorized representative who has indicated his/her understanding and acceptance.     Dental advisory given  Plan Discussed with: CRNA and Anesthesiologist  Anesthesia Plan Comments:         Anesthesia Quick Evaluation

## 2020-12-31 NOTE — Transfer of Care (Signed)
Immediate Anesthesia Transfer of Care Note  Patient: Francisco Frank  Procedure(s) Performed: INSERTION PORT-A-CATH  Patient Location: PACU  Anesthesia Type:General  Level of Consciousness: awake, alert , oriented and patient cooperative  Airway & Oxygen Therapy: Patient Spontanous Breathing and Patient connected to face mask oxygen  Post-op Assessment: Report given to RN and Post -op Vital signs reviewed and stable  Post vital signs: Reviewed and stable  Last Vitals:  Vitals Value Taken Time  BP 106/63 12/31/20 0945  Temp    Pulse 69 12/31/20 0946  Resp 10 12/31/20 0946  SpO2 100 % 12/31/20 0946  Vitals shown include unvalidated device data.  Last Pain:  Vitals:   12/31/20 0659  TempSrc:   PainSc: 0-No pain         Complications: No notable events documented.

## 2020-12-31 NOTE — Op Note (Signed)
Date: 12/31/20  Patient: Francisco Frank MRN: [id number]  Preoperative Diagnosis: Pancreatic adenocarcinoma Postoperative Diagnosis: Same  Procedure: Portacath insertion  Surgeon: Michaelle Birks, MD  EBL: Minimal  Anesthesia: General LMA  Specimens: None  Indications: [name] is a 70 year old male who was diagnosed with pancreatic adenocarcinoma.  He is to undergo neoadjuvant chemotherapy and presents for port placement.  Findings: 8 [place] PowerPort placed via the right subclavian vein under fluoroscopic guidance.  Total catheter length of 15 cm.  Procedure details: Informed consent was obtained in the preoperative area prior to the procedure. The patient was brought to the operating room and placed on the table in the supine position.  General anesthesia was induced and appropriate lines and drains were placed for intraoperative monitoring. Perioperative antibiotics were administered per SCIP guidelines. The neck and chest were prepped and draped in the usual sterile fashion. A pre-procedure timeout was taken verifying patient identity, surgical site and procedure to be performed.  The patient was placed in Trendelenberg and the right subclavian vein was accessed with a large-bore needle. A guidewire was inserted and advanced, and position in the SVC was confirmed fluoroscopically. The needle was removed and the wire was clipped to the drapes to secure its position. A small skin incision was made on the right upper chest wall incorporating the wire exit site and a subcutaneous pocket was created with cautery. The port and catheter were then flushed and brought onto the field. Three 2-0 prolene sutures were used to secure the port in the subcutaneous pocket, but the sutures were not tied down. The port was placed in the pocket and the catheter was then measured using fluoro - it was placed over the skin adjacent to the guidewire, and marked externally at the cavoatrial junction. The  catheter was then cut at this location, which was at 15cm. The dilator and sheath were then advanced over the guidewire under fluoroscopic guidance, and the wire and dilator were removed. The end of the catheter was inserted through the sheath and advanced, and the sheath was peeled away. The port was then accessed with a Huber needle, and blood was aspirated and the port was flushed with heparinized saline.  The Prolene sutures were tied down.  A final fluoroscopic image confirmed appropriate position of the catheter tip within the SVC, without kinking of the catheter. The skin was closed with a deep dermal layer of interrupted 3-0 Vicryl suture, followed by a running subcuticular 4-0 monocryl suture.  The port was accessed with a Huber needle and a final flush of 500 units heparin (100 units/mL) was given via the port.  The port was left accessed to be used for chemotherapy tomorrow.  Steri-Strips were applied over the incision, and a Tegaderm was placed over the access needle and tubing.  The patient tolerated the procedure with no apparent complications.  All counts were correct x2 at the end of the procedure. The patient was extubated and taken to PACU in stable condition.  Michaelle Birks, MD 12/31/20 9:48 AM

## 2021-01-01 ENCOUNTER — Inpatient Hospital Stay: Payer: Medicare Other

## 2021-01-01 ENCOUNTER — Inpatient Hospital Stay: Payer: Medicare Other | Attending: Nurse Practitioner

## 2021-01-01 ENCOUNTER — Other Ambulatory Visit: Payer: Self-pay

## 2021-01-01 ENCOUNTER — Telehealth: Payer: Self-pay

## 2021-01-01 ENCOUNTER — Encounter (HOSPITAL_COMMUNITY): Payer: Self-pay | Admitting: Surgery

## 2021-01-01 ENCOUNTER — Inpatient Hospital Stay (HOSPITAL_BASED_OUTPATIENT_CLINIC_OR_DEPARTMENT_OTHER): Payer: Medicare Other | Admitting: Oncology

## 2021-01-01 VITALS — BP 101/77 | HR 64 | Temp 97.8°F | Resp 18 | Ht 68.0 in | Wt 144.2 lb

## 2021-01-01 DIAGNOSIS — C251 Malignant neoplasm of body of pancreas: Secondary | ICD-10-CM | POA: Insufficient documentation

## 2021-01-01 DIAGNOSIS — E119 Type 2 diabetes mellitus without complications: Secondary | ICD-10-CM | POA: Diagnosis not present

## 2021-01-01 DIAGNOSIS — Z79899 Other long term (current) drug therapy: Secondary | ICD-10-CM | POA: Insufficient documentation

## 2021-01-01 DIAGNOSIS — Z5111 Encounter for antineoplastic chemotherapy: Secondary | ICD-10-CM | POA: Diagnosis present

## 2021-01-01 DIAGNOSIS — C250 Malignant neoplasm of head of pancreas: Secondary | ICD-10-CM

## 2021-01-01 LAB — CBC WITH DIFFERENTIAL (CANCER CENTER ONLY)
Abs Immature Granulocytes: 0.07 10*3/uL (ref 0.00–0.07)
Basophils Absolute: 0.2 10*3/uL — ABNORMAL HIGH (ref 0.0–0.1)
Basophils Relative: 2 %
Eosinophils Absolute: 0.5 10*3/uL (ref 0.0–0.5)
Eosinophils Relative: 6 %
HCT: 29.3 % — ABNORMAL LOW (ref 39.0–52.0)
Hemoglobin: 9.6 g/dL — ABNORMAL LOW (ref 13.0–17.0)
Immature Granulocytes: 1 %
Lymphocytes Relative: 29 %
Lymphs Abs: 2.7 10*3/uL (ref 0.7–4.0)
MCH: 29.5 pg (ref 26.0–34.0)
MCHC: 32.8 g/dL (ref 30.0–36.0)
MCV: 90.2 fL (ref 80.0–100.0)
Monocytes Absolute: 0.8 10*3/uL (ref 0.1–1.0)
Monocytes Relative: 9 %
Neutro Abs: 5.1 10*3/uL (ref 1.7–7.7)
Neutrophils Relative %: 53 %
Platelet Count: 438 10*3/uL — ABNORMAL HIGH (ref 150–400)
RBC: 3.25 MIL/uL — ABNORMAL LOW (ref 4.22–5.81)
RDW: 16.7 % — ABNORMAL HIGH (ref 11.5–15.5)
WBC Count: 9.3 10*3/uL (ref 4.0–10.5)
nRBC: 0 % (ref 0.0–0.2)

## 2021-01-01 LAB — CMP (CANCER CENTER ONLY)
ALT: 22 U/L (ref 0–44)
AST: 19 U/L (ref 15–41)
Albumin: 3.6 g/dL (ref 3.5–5.0)
Alkaline Phosphatase: 105 U/L (ref 38–126)
Anion gap: 7 (ref 5–15)
BUN: 15 mg/dL (ref 8–23)
CO2: 28 mmol/L (ref 22–32)
Calcium: 9.1 mg/dL (ref 8.9–10.3)
Chloride: 104 mmol/L (ref 98–111)
Creatinine: 0.74 mg/dL (ref 0.61–1.24)
GFR, Estimated: >60 mL/min (ref >60)
Glucose, Bld: 156 mg/dL — ABNORMAL HIGH (ref 70–99)
Potassium: 4.1 mmol/L (ref 3.5–5.1)
Sodium: 139 mmol/L (ref 135–145)
Total Bilirubin: 1.3 mg/dL — ABNORMAL HIGH (ref 0.3–1.2)
Total Protein: 6.8 g/dL (ref 6.5–8.1)

## 2021-01-01 MED ORDER — DEXTROSE 5 % IV SOLN
400.0000 mg/m2 | Freq: Once | INTRAVENOUS | Status: DC
  Filled 2021-01-01: qty 35.4

## 2021-01-01 MED ORDER — SODIUM CHLORIDE 0.9 % IV SOLN
10.0000 mg | Freq: Once | INTRAVENOUS | Status: AC
  Administered 2021-01-01: 10 mg via INTRAVENOUS
  Filled 2021-01-01: qty 10

## 2021-01-01 MED ORDER — OXALIPLATIN CHEMO INJECTION 100 MG/20ML
85.0000 mg/m2 | Freq: Once | INTRAVENOUS | Status: AC
  Administered 2021-01-01: 150 mg via INTRAVENOUS
  Filled 2021-01-01: qty 20

## 2021-01-01 MED ORDER — DEXTROSE 5 % IV SOLN: Freq: Once | INTRAVENOUS | Status: AC

## 2021-01-01 MED ORDER — SODIUM CHLORIDE 0.9 % IV SOLN
150.0000 mg | Freq: Once | INTRAVENOUS | Status: AC
  Administered 2021-01-01: 150 mg via INTRAVENOUS
  Filled 2021-01-01: qty 5

## 2021-01-01 MED ORDER — SODIUM CHLORIDE 0.9 % IV SOLN
2400.0000 mg/m2 | INTRAVENOUS | Status: DC
  Administered 2021-01-01: 4250 mg via INTRAVENOUS
  Filled 2021-01-01: qty 85

## 2021-01-01 MED ORDER — LEUCOVORIN CALCIUM INJECTION 350 MG
400.0000 mg/m2 | Freq: Once | INTRAVENOUS | Status: AC
  Administered 2021-01-01: 708 mg via INTRAVENOUS
  Filled 2021-01-01: qty 35.4

## 2021-01-01 MED ORDER — PALONOSETRON HCL INJECTION 0.25 MG/5ML
0.2500 mg | Freq: Once | INTRAVENOUS | Status: AC
  Administered 2021-01-01: 0.25 mg via INTRAVENOUS
  Filled 2021-01-01: qty 5

## 2021-01-01 NOTE — Telephone Encounter (Signed)
Patient seen by Dr. Sherrill today ? ?Vitals are within treatment parameters. ? ?Labs reviewed by Dr. Sherrill and are within treatment parameters. ? ?Per physician team, patient is ready for treatment and there are NO modifications to the treatment plan.  ?

## 2021-01-01 NOTE — Progress Notes (Signed)
Patient presents for treatment. RN assessment completed along with the following:  Labs/vitals reviewed - Yes, and within treatment parameters.   Weight within 10% of previous measurement - Yes Oncology Treatment Attestation completed for current therapy- Yes, on date 12/10/2020 Informed consent completed and reflects current therapy/intent - Yes, on date 01/01/2021             Provider progress note reviewed - Yes, today's provider note was reviewed. Treatment/Antibody/Supportive plan reviewed - Yes, and Irinotecan will be held today, will be added to cycle 2 per Dr. Benay Spice. S&H and other orders reviewed - Yes, and there are no additional orders identified. Previous treatment date reviewed - Yes, and the appropriate amount of time has elapsed between treatments. Clinic Hand Off Received from [facility] nurse note.  Patient to proceed with treatment.

## 2021-01-01 NOTE — Patient Instructions (Addendum)
[place]   Discharge Instructions: Thank you for choosing [facility] to provide your oncology and hematology care.   If you have a lab appointment with the [facility], please go directly to the [facility] and check in at the registration area.   Wear comfortable clothing and clothing appropriate for easy access to any Portacath or PICC line.   We strive to give you quality time with your provider. You may need to reschedule your appointment if you arrive late (15 or more minutes).  Arriving late affects you and other patients whose appointments are after yours.  Also, if you miss three or more appointments without notifying the office, you may be dismissed from the clinic at the provider's discretion.      For prescription refill requests, have your pharmacy contact our office and allow 72 hours for refills to be completed.    Today you received the following chemotherapy and/or immunotherapy agents Oxaliplatin (ELOXATIN), Leucovorin & Flourouracil (ADRUCIL).      To help prevent nausea and vomiting after your treatment, we encourage you to take your nausea medication as directed.  BELOW ARE SYMPTOMS THAT SHOULD BE REPORTED IMMEDIATELY: *FEVER GREATER THAN 100.4 F (38 C) OR HIGHER *CHILLS OR SWEATING *NAUSEA AND VOMITING THAT IS NOT CONTROLLED WITH YOUR NAUSEA MEDICATION *UNUSUAL SHORTNESS OF BREATH *UNUSUAL BRUISING OR BLEEDING *URINARY PROBLEMS (pain or burning when urinating, or frequent urination) *BOWEL PROBLEMS (unusual diarrhea, constipation, pain near the anus) TENDERNESS IN MOUTH AND THROAT WITH OR WITHOUT PRESENCE OF ULCERS (sore throat, sores in mouth, or a toothache) UNUSUAL RASH, SWELLING OR PAIN  UNUSUAL VAGINAL DISCHARGE OR ITCHING   Items with * indicate a potential emergency and should be followed up as soon as possible or go to the Emergency Department if any problems should occur.  Please show the CHEMOTHERAPY ALERT  CARD or IMMUNOTHERAPY ALERT CARD at check-in to the Emergency Department and triage nurse.  Should you have questions after your visit or need to cancel or reschedule your appointment, please contact [facility]  Dept: [phone]  and follow the prompts.  Office hours are 8:00 a.m. to 4:30 p.m. Monday - Friday. Please note that voicemails left after 4:00 p.m. may not be returned until the following business day.  We are closed weekends and major holidays. You have access to a nurse at all times for urgent questions. Please call the main number to the clinic Dept: [phone] and follow the prompts.   For any non-urgent questions, you may also contact your provider using MyChart. We now offer e-Visits for anyone 34 and older to request care online for non-urgent symptoms. For details visit [email or web address].   Also download the MyChart app! Go to the app store, search "MyChart", open the app, select [facility], and log in with your MyChart username and password.  Due to Covid, a mask is required upon entering the hospital/clinic. If you do not have a mask, one will be given to you upon arrival. For doctor visits, patients may have 1 support person aged 67 or older with them. For treatment visits, patients cannot have anyone with them due to current Covid guidelines and our immunocompromised population.   Oxaliplatin Injection What is this medication? OXALIPLATIN (ox AL i PLA tin) is a chemotherapy drug. It targets fast dividing cells, like cancer cells, and causes these cells to die. This medicine is used to treat cancers of the colon and rectum, and  many other cancers. This medicine may be used for other purposes; ask your health care provider or pharmacist if you have questions. COMMON BRAND NAME(S): Eloxatin What should I tell my care team before I take this medication? They need to know if you have any of these conditions: heart disease history of irregular  heartbeat liver disease low blood counts, like white cells, platelets, or red blood cells lung or breathing disease, like asthma take medicines that treat or prevent blood clots tingling of the fingers or toes, or other nerve disorder an unusual or allergic reaction to oxaliplatin, other chemotherapy, other medicines, foods, dyes, or preservatives pregnant or trying to get pregnant breast-feeding How should I use this medication? This drug is given as an infusion into a vein. It is administered in a hospital or clinic by a specially trained health care professional. Talk to your pediatrician regarding the use of this medicine in children. Special care may be needed. Overdosage: If you think you have taken too much of this medicine contact a poison control center or emergency room at once. NOTE: This medicine is only for you. Do not share this medicine with others. What if I miss a dose? It is important not to miss a dose. Call your doctor or health care professional if you are unable to keep an appointment. What may interact with this medication? Do not take this medicine with any of the following medications: cisapride dronedarone pimozide thioridazine This medicine may also interact with the following medications: aspirin and aspirin-like medicines certain medicines that treat or prevent blood clots like warfarin, apixaban, dabigatran, and rivaroxaban cisplatin cyclosporine diuretics medicines for infection like acyclovir, adefovir, amphotericin B, bacitracin, cidofovir, foscarnet, ganciclovir, gentamicin, pentamidine, vancomycin NSAIDs, medicines for pain and inflammation, like ibuprofen or naproxen other medicines that prolong the QT interval (an abnormal heart rhythm) pamidronate zoledronic acid This list may not describe all possible interactions. Give your health care provider a list of all the medicines, herbs, non-prescription drugs, or dietary supplements you use. Also tell  them if you smoke, drink alcohol, or use illegal drugs. Some items may interact with your medicine. What should I watch for while using this medication? Your condition will be monitored carefully while you are receiving this medicine. You may need blood work done while you are taking this medicine. This medicine may make you feel generally unwell. This is not uncommon as chemotherapy can affect healthy cells as well as cancer cells. Report any side effects. Continue your course of treatment even though you feel ill unless your healthcare professional tells you to stop. This medicine can make you more sensitive to cold. Do not drink cold drinks or use ice. Cover exposed skin before coming in contact with cold temperatures or cold objects. When out in cold weather wear warm clothing and cover your mouth and nose to warm the air that goes into your lungs. Tell your doctor if you get sensitive to the cold. Do not become pregnant while taking this medicine or for 9 months after stopping it. Women should inform their health care professional if they wish to become pregnant or think they might be pregnant. Men should not father a child while taking this medicine and for 6 months after stopping it. There is potential for serious side effects to an unborn child. Talk to your health care professional for more information. Do not breast-feed a child while taking this medicine or for 3 months after stopping it. This medicine has caused ovarian failure in  some women. This medicine may make it more difficult to get pregnant. Talk to your health care professional if you are concerned about your fertility. This medicine has caused decreased sperm counts in some men. This may make it more difficult to father a child. Talk to your health care professional if you are concerned about your fertility. This medicine may increase your risk of getting an infection. Call your health care professional for advice if you get a fever,  chills, or sore throat, or other symptoms of a cold or flu. Do not treat yourself. Try to avoid being around people who are sick. Avoid taking medicines that contain aspirin, acetaminophen, ibuprofen, naproxen, or ketoprofen unless instructed by your health care professional. These medicines may hide a fever. Be careful brushing or flossing your teeth or using a toothpick because you may get an infection or bleed more easily. If you have any dental work done, tell your dentist you are receiving this medicine. What side effects may I notice from receiving this medication? Side effects that you should report to your doctor or health care professional as soon as possible: allergic reactions like skin rash, itching or hives, swelling of the face, lips, or tongue breathing problems cough low blood counts - this medicine may decrease the number of white blood cells, red blood cells, and platelets. You may be at increased risk for infections and bleeding nausea, vomiting pain, redness, or irritation at site where injected pain, tingling, numbness in the hands or feet signs and symptoms of bleeding such as bloody or black, tarry stools; red or dark brown urine; spitting up blood or brown material that looks like coffee grounds; red spots on the skin; unusual bruising or bleeding from the eyes, gums, or nose signs and symptoms of a dangerous change in heartbeat or heart rhythm like chest pain; dizziness; fast, irregular heartbeat; palpitations; feeling faint or lightheaded; falls signs and symptoms of infection like fever; chills; cough; sore throat; pain or trouble passing urine signs and symptoms of liver injury like dark yellow or brown urine; general ill feeling or flu-like symptoms; light-colored stools; loss of appetite; nausea; right upper belly pain; unusually weak or tired; yellowing of the eyes or skin signs and symptoms of low red blood cells or anemia such as unusually weak or tired; feeling faint  or lightheaded; falls signs and symptoms of muscle injury like dark urine; trouble passing urine or change in the amount of urine; unusually weak or tired; muscle pain; back pain Side effects that usually do not require medical attention (report to your doctor or health care professional if they continue or are bothersome): changes in taste diarrhea gas hair loss loss of appetite mouth sores This list may not describe all possible side effects. Call your doctor for medical advice about side effects. You may report side effects to FDA at 1-800-FDA-1088. Where should I keep my medication? This drug is given in a hospital or clinic and will not be stored at home. NOTE: This sheet is a summary. It may not cover all possible information. If you have questions about this medicine, talk to your doctor, pharmacist, or health care provider.  2022 Elsevier/Gold Standard (2018-07-26 12:20:35)  Leucovorin injection What is this medication? LEUCOVORIN (loo koe VOR in) is used to prevent or treat the harmful effects of some medicines. This medicine is used to treat anemia caused by a low amount of folic acid in the body. It is also used with 5-fluorouracil (5-FU) to treat colon  cancer. This medicine may be used for other purposes; ask your health care provider or pharmacist if you have questions. What should I tell my care team before I take this medication? They need to know if you have any of these conditions: anemia from low levels of vitamin B-12 in the blood an unusual or allergic reaction to leucovorin, folic acid, other medicines, foods, dyes, or preservatives pregnant or trying to get pregnant breast-feeding How should I use this medication? This medicine is for injection into a muscle or into a vein. It is given by a health care professional in a hospital or clinic setting. Talk to your pediatrician regarding the use of this medicine in children. Special care may be needed. Overdosage: If you  think you have taken too much of this medicine contact a poison control center or emergency room at once. NOTE: This medicine is only for you. Do not share this medicine with others. What if I miss a dose? This does not apply. What may interact with this medication? capecitabine fluorouracil phenobarbital phenytoin primidone trimethoprim-sulfamethoxazole This list may not describe all possible interactions. Give your health care provider a list of all the medicines, herbs, non-prescription drugs, or dietary supplements you use. Also tell them if you smoke, drink alcohol, or use illegal drugs. Some items may interact with your medicine. What should I watch for while using this medication? Your condition will be monitored carefully while you are receiving this medicine. This medicine may increase the side effects of 5-fluorouracil, 5-FU. Tell your doctor or health care professional if you have diarrhea or mouth sores that do not get better or that get worse. What side effects may I notice from receiving this medication? Side effects that you should report to your doctor or health care professional as soon as possible: allergic reactions like skin rash, itching or hives, swelling of the face, lips, or tongue breathing problems fever, infection mouth sores unusual bleeding or bruising unusually weak or tired Side effects that usually do not require medical attention (report to your doctor or health care professional if they continue or are bothersome): constipation or diarrhea loss of appetite nausea, vomiting This list may not describe all possible side effects. Call your doctor for medical advice about side effects. You may report side effects to FDA at 1-800-FDA-1088. Where should I keep my medication? This drug is given in a hospital or clinic and will not be stored at home. NOTE: This sheet is a summary. It may not cover all possible information. If you have questions about this  medicine, talk to your doctor, pharmacist, or health care provider.  2022 Elsevier/Gold Standard (2007-09-12 16:50:29)  Fluorouracil, 5-FU injection What is this medication? FLUOROURACIL, 5-FU (flure oh YOOR a sil) is a chemotherapy drug. It slows the growth of cancer cells. This medicine is used to treat many types of cancer like breast cancer, colon or rectal cancer, pancreatic cancer, and stomach cancer. This medicine may be used for other purposes; ask your health care provider or pharmacist if you have questions. COMMON BRAND NAME(S): Adrucil What should I tell my care team before I take this medication? They need to know if you have any of these conditions: blood disorders dihydropyrimidine dehydrogenase (DPD) deficiency infection (especially a virus infection such as chickenpox, cold sores, or herpes) kidney disease liver disease malnourished, poor nutrition recent or ongoing radiation therapy an unusual or allergic reaction to fluorouracil, other chemotherapy, other medicines, foods, dyes, or preservatives pregnant or trying to get pregnant  breast-feeding How should I use this medication? This drug is given as an infusion or injection into a vein. It is administered in a hospital or clinic by a specially trained health care professional. Talk to your pediatrician regarding the use of this medicine in children. Special care may be needed. Overdosage: If you think you have taken too much of this medicine contact a poison control center or emergency room at once. NOTE: This medicine is only for you. Do not share this medicine with others. What if I miss a dose? It is important not to miss your dose. Call your doctor or health care professional if you are unable to keep an appointment. What may interact with this medication? Do not take this medicine with any of the following medications: live virus vaccines This medicine may also interact with the following medications: medicines  that treat or prevent blood clots like warfarin, enoxaparin, and dalteparin This list may not describe all possible interactions. Give your health care provider a list of all the medicines, herbs, non-prescription drugs, or dietary supplements you use. Also tell them if you smoke, drink alcohol, or use illegal drugs. Some items may interact with your medicine. What should I watch for while using this medication? Visit your doctor for checks on your progress. This drug may make you feel generally unwell. This is not uncommon, as chemotherapy can affect healthy cells as well as cancer cells. Report any side effects. Continue your course of treatment even though you feel ill unless your doctor tells you to stop. In some cases, you may be given additional medicines to help with side effects. Follow all directions for their use. Call your doctor or health care professional for advice if you get a fever, chills or sore throat, or other symptoms of a cold or flu. Do not treat yourself. This drug decreases your body's ability to fight infections. Try to avoid being around people who are sick. This medicine may increase your risk to bruise or bleed. Call your doctor or health care professional if you notice any unusual bleeding. Be careful brushing and flossing your teeth or using a toothpick because you may get an infection or bleed more easily. If you have any dental work done, tell your dentist you are receiving this medicine. Avoid taking products that contain aspirin, acetaminophen, ibuprofen, naproxen, or ketoprofen unless instructed by your doctor. These medicines may hide a fever. Do not become pregnant while taking this medicine. Women should inform their doctor if they wish to become pregnant or think they might be pregnant. There is a potential for serious side effects to an unborn child. Talk to your health care professional or pharmacist for more information. Do not breast-feed an infant while taking  this medicine. Men should inform their doctor if they wish to father a child. This medicine may lower sperm counts. Do not treat diarrhea with over the counter products. Contact your doctor if you have diarrhea that lasts more than 2 days or if it is severe and watery. This medicine can make you more sensitive to the sun. Keep out of the sun. If you cannot avoid being in the sun, wear protective clothing and use sunscreen. Do not use sun lamps or tanning beds/booths. What side effects may I notice from receiving this medication? Side effects that you should report to your doctor or health care professional as soon as possible: allergic reactions like skin rash, itching or hives, swelling of the face, lips, or tongue low blood  counts - this medicine may decrease the number of white blood cells, red blood cells and platelets. You may be at increased risk for infections and bleeding. signs of infection - fever or chills, cough, sore throat, pain or difficulty passing urine signs of decreased platelets or bleeding - bruising, pinpoint red spots on the skin, black, tarry stools, blood in the urine signs of decreased red blood cells - unusually weak or tired, fainting spells, lightheadedness breathing problems changes in vision chest pain mouth sores nausea and vomiting pain, swelling, redness at site where injected pain, tingling, numbness in the hands or feet redness, swelling, or sores on hands or feet stomach pain unusual bleeding Side effects that usually do not require medical attention (report to your doctor or health care professional if they continue or are bothersome): changes in finger or toe nails diarrhea dry or itchy skin hair loss headache loss of appetite sensitivity of eyes to the light stomach upset unusually teary eyes This list may not describe all possible side effects. Call your doctor for medical advice about side effects. You may report side effects to FDA at  1-800-FDA-1088. Where should I keep my medication? This drug is given in a hospital or clinic and will not be stored at home. NOTE: This sheet is a summary. It may not cover all possible information. If you have questions about this medicine, talk to your doctor, pharmacist, or health care provider.  2022 Elsevier/Gold Standard (2019-02-06 15:00:03)  The chemotherapy medication bag should finish at 46 hours, 96 hours, or 7 days. For example, if your pump is scheduled for 46 hours and it was put on at 4:00 p.m., it should finish at 2:00 p.m. the day it is scheduled to come off regardless of your appointment time.     Estimated time to finish at 11:30 a.m. on Saturday 01/03/2021.   If the display on your pump reads "Low Volume" and it is beeping, take the batteries out of the pump and come to the cancer center for it to be taken off.   If the pump alarms go off prior to the pump reading "Low Volume" then call [phone] and someone can assist you.  If the plunger comes out and the chemotherapy medication is leaking out, please use your home chemo spill kit to clean up the spill. Do NOT use paper towels or other household products.  If you have problems or questions regarding your pump, please call either [phone] (24 hours a day) or the cancer center Monday-Friday 8:00 a.m.- 4:30 p.m. at the clinic number and we will assist you. If you are unable to get assistance, then go to the nearest Emergency Department and ask the staff to contact the IV team for assistance.

## 2021-01-01 NOTE — Progress Notes (Signed)
[place] OFFICE PROGRESS NOTE   Diagnosis: Pancreas cancer  INTERVAL HISTORY:   Mr Neville was admitted 12/12/2020 with acute cholecystitis.  He underwent placement of a percutaneous cholecystostomy tube on 12/17/2020.  He was discharged home on 12/23/2020 with the cholecystostomy tube in place. Mr Lisbon underwent Port-A-Cath placement yesterday. He is eating.  No pain.  The cholecystostomy tube is draining.  Objective:  Vital signs in last 24 hours:  Blood pressure 101/77, pulse 64, temperature 97.8 F (36.6 C), temperature source Oral, resp. rate 18, height 5\' 8"  (1.727 m), weight 144 lb 3.2 oz (65.4 kg), SpO2 100 %.    HEENT: Sclera anicteric, no thrush Resp: Lungs clear bilaterally Cardio: Regular rate and rhythm GI: No hepatosplenomegaly, nontender, right upper quadrant cholecystostomy tube with a gauze dressing Vascular: No leg edema    Portacath/PICC-without erythema  Lab Results:  Lab Results  Component Value Date   WBC 9.3 01/01/2021   HGB 9.6 (L) 01/01/2021   HCT 29.3 (L) 01/01/2021   MCV 90.2 01/01/2021   PLT 438 (H) 01/01/2021   NEUTROABS 5.1 01/01/2021    CMP  Lab Results  Component Value Date   NA 136 12/23/2020   K 3.7 12/23/2020   CL 107 12/23/2020   CO2 24 12/23/2020   GLUCOSE 146 (H) 12/23/2020   BUN 10 12/23/2020   CREATININE 0.94 12/23/2020   CALCIUM 8.3 (L) 12/23/2020   PROT 5.7 (L) 12/19/2020   ALBUMIN 2.1 (L) 12/19/2020   AST 31 12/19/2020   ALT 25 12/19/2020   ALKPHOS 102 12/19/2020   BILITOT 1.7 (H) 12/19/2020   GFRNONAA >60 12/23/2020    Lab Results  Component Value Date   CAN199 1,641 (H) 11/11/2020   Imaging:  DG CHEST PORT 1 VIEW  Result Date: 12/31/2020 CLINICAL DATA:  Status post Port-A-Cath placement EXAM: PORTABLE CHEST 1 VIEW COMPARISON:  12/12/2020 FINDINGS: Right chest wall port a catheter is noted with tip in the projection of the SVC. No pneumothorax identified. Heart size and mediastinal  contours are unremarkable. Lungs are clear. No pleural effusion or edema. No airspace densities. IMPRESSION: Status post right chest wall port a catheter placement without complication. Electronically Signed   By: Kerby Moors M.D.   On: 12/31/2020 10:33   DG C-Arm 1-60 Min-No Report  Result Date: 12/31/2020 Fluoroscopy was utilized by the requesting physician.  No radiographic interpretation.    Medications: I have reviewed the patient's current medications.   Assessment/Plan: Pancreas cancer Outside CT-apparent pancreas abnormality (we do not have a copy of the report) MRI abdomen 11/06/2020-pancreatic body and tail atrophy with duct dilatation up to 9 mm.  Both ducts undergo an abrupt transition in the region of the pancreatic head.  Non border deforming pancreatic head mass measuring 3.2 x 2.6 cm.  No arterial involvement by tumor.  SMV adjacent to presumed tumor without encasement.  Portal vein uninvolved.  No abdominal adenopathy.  Left periaortic 7 mm node not pathologic by size criteria.  No ascites.  Subtle left-sided pericolonic nodule 5 mm. EUS/ERCP 11/11/2020-EGD showed gastritis which was biopsied, mucosal changes in the duodenum, mucosal changes in the duodenum sweep (gastric antral and oxyntic mucosa with no specific histopathologic changes; Warthin Starry stain negative for H pylori).  EUS showed an irregular mass in the pancreatic head measuring 26 mm x 30 mm; the outer margins were irregular.  There was sonographic evidence suggesting invasion into the portal vein (manifested by abutment) and the superior mesenteric vein (manifested by  abutment); an intact interface was seen between the mass and the superior mesenteric artery and celiac trunk suggesting a lack of invasion.  Endosonographic imaging in the visualized portion of the liver showed no mass.  There was dilatation of the common bile duct and the common hepatic duct.  No malignant appearing lymph nodes were visualized in the  celiac region, peripancreatic region, porta hepatis region.  FNA biopsy of the pancreas head mass showed malignant cells consistent with adenocarcinoma.  Biliary access could not be obtained.  Cholangiogram 11/12/2020-severe intrahepatic biliary dilatation.  A peripheral right hepatic bile duct was successfully cannulated.  Internal/external biliary drain placed.  Cytology on biliary drain bile showed malignant cells consistent with adenocarcinoma.   CT chest 11/13/2020-no evidence of metastatic disease.   CT pelvis 11/13/2020-nonspecific circumferential wall thickening of the cecum measuring up to 12 mm in thickness.  Otherwise no evidence of intrapelvic metastases.   11/19/2020-biliary stent placement, biliary drain exchange 11/28/2020-exchange of internal/external biliary drain secondary to persistent jaundice CT abdomen/pelvis 11/28/2020-pancreas head mass, 3.5 x 2.8 cm, no encasement of the celiac trunk or SMA, partial encasement of the portal venous confluence-at least 180 degrees, partial encasement of the SMV-approximately 180 degrees, ventral extension of tumor abuts the posterior duodenum, no adenopathy, percutaneous biliary stent with decompression of previous dilated intrahepatic and common bile ducts Cycle 1 FOLFIRINOX 01/01/2021, irinotecan held  Painless jaundice-internal/external biliary drain placed 11/12/2020.  Biliary stent placed and drain exchange 11/19/2020, exchange of internal/external drain 11/28/2020 Weight loss Diabetes diagnosed June 2022 Change in bowel habits February 2022 Colonoscopy 08/06/2020-8 mm polyp in the sigmoid colon, a few small and large mouth diverticula in the sigmoid colon, normal mucosa found in the entire colon with biopsies for histology taken with a cold forceps from the right colon and left colon for evaluation of microscopic colitis (right colon-colonic mucosa with no significant pathologic findings; left colon-colonic mucosa with no significant pathologic findings;  sigmoid polyp-tubular adenoma, negative for high-grade dysplasia). Hypertension Admission 12/12/2020 with acute cholecystitis, cholecystostomy tube 12/17/2020      Disposition: Mr Baruch has recovered from the admission with acute cholecystitis.  He underwent Port-A-Cath placement yesterday.  He appears stable to begin chemotherapy today.  Irinotecan will be held with cycle 1 FOLFIRINOX in order to assess his tolerance.  Irinotecan will be added to the regimen with cycle 2 if he tolerates the chemotherapy well.  Mr. Bisono will return for an office visit and chemotherapy in 2 weeks.  The chemotherapy was adjusted based on his current weight.  We reviewed his medical regimen today.  Several medications will be discontinued including amlodipine, Benzepril, multivitamin, and hycosamine  Betsy Coder, MD  01/01/2021  9:02 AM

## 2021-01-02 ENCOUNTER — Telehealth: Payer: Self-pay | Admitting: *Deleted

## 2021-01-02 ENCOUNTER — Encounter (HOSPITAL_COMMUNITY): Payer: Medicare Other

## 2021-01-02 LAB — CANCER ANTIGEN 19-9: CA 19-9: 1048 U/mL — ABNORMAL HIGH (ref 0–35)

## 2021-01-02 NOTE — Telephone Encounter (Signed)
24 hour call back made: Patient doing okay post treatment day 1 (no chemo related side effects voiced). He slept good through the night. Patient did state that his blood sugar got above 400. Patient instructed that the dexamethasone given prior to chemo will raise blood sugar. I instructed him to keep a log of it so he can discuss with Dr. Benay Spice at the next visit. Patient does take Novolog SSI and he took this yesterday to cover his high blood sugar level. Patient to go to [facility] tomorrow 10/15 to have his ambulatory pump removed. Patient instructed to call us with any additional questions or concerns throughout the day. He verbalized understanding of instructions.

## 2021-01-03 ENCOUNTER — Inpatient Hospital Stay: Payer: Medicare Other

## 2021-01-03 ENCOUNTER — Other Ambulatory Visit: Payer: Self-pay

## 2021-01-03 VITALS — BP 117/78 | HR 61 | Temp 97.8°F | Resp 18

## 2021-01-03 DIAGNOSIS — C251 Malignant neoplasm of body of pancreas: Secondary | ICD-10-CM | POA: Diagnosis not present

## 2021-01-03 DIAGNOSIS — C250 Malignant neoplasm of head of pancreas: Secondary | ICD-10-CM

## 2021-01-03 MED ORDER — SODIUM CHLORIDE 0.9% FLUSH
10.0000 mL | INTRAVENOUS | Status: DC | PRN
  Administered 2021-01-03: 10 mL

## 2021-01-03 MED ORDER — HEPARIN SOD (PORK) LOCK FLUSH 100 UNIT/ML IV SOLN
500.0000 [IU] | Freq: Once | INTRAVENOUS | Status: AC | PRN
  Administered 2021-01-03: 500 [IU]

## 2021-01-03 NOTE — Patient Instructions (Signed)
Implanted Port Home Guide An implanted port is a device that is placed under the skin. It is usually placed in the chest. The device can be used to give IV medicine, to take blood, or for dialysis. You may have an implanted port if: You need IV medicine that would be irritating to the small veins in your hands or arms. You need IV medicines, such as antibiotics, for a long period of time. You need IV nutrition for a long period of time. You need dialysis. When you have a port, your health care provider can choose to use the port instead of veins in your arms for these procedures. You may have fewer limitations when using a port than you would if you used other types of long-term IVs, and you will likely be able to return to normal activities after your incision heals. An implanted port has two main parts: Reservoir. The reservoir is the part where a needle is inserted to give medicines or draw blood. The reservoir is round. After it is placed, it appears as a small, raised area under your skin. Catheter. The catheter is a thin, flexible tube that connects the reservoir to a vein. Medicine that is inserted into the reservoir goes into the catheter and then into the vein. How is my port accessed? To access your port: A numbing cream may be placed on the skin over the port site. Your health care provider will put on a mask and sterile gloves. The skin over your port will be cleaned carefully with a germ-killing soap and allowed to dry. Your health care provider will gently pinch the port and insert a needle into it. Your health care provider will check for a blood return to make sure the port is in the vein and is not clogged. If your port needs to remain accessed to get medicine continuously (constant infusion), your health care provider will place a clear bandage (dressing) over the needle site. The dressing and needle will need to be changed every week, or as told by your health care provider. What  is flushing? Flushing helps keep the port from getting clogged. Follow instructions from your health care provider about how and when to flush the port. Ports are usually flushed with saline solution or a medicine called heparin. The need for flushing will depend on how the port is used: If the port is only used from time to time to give medicines or draw blood, the port may need to be flushed: Before and after medicines have been given. Before and after blood has been drawn. As part of routine maintenance. Flushing may be recommended every 4-6 weeks. If a constant infusion is running, the port may not need to be flushed. Throw away any syringes in a disposal container that is meant for sharp items (sharps container). You can buy a sharps container from a pharmacy, or you can make one by using an empty hard plastic bottle with a cover. How long will my port stay implanted? The port can stay in for as long as your health care provider thinks it is needed. When it is time for the port to come out, a surgery will be done to remove it. The surgery will be similar to the procedure that was done to put the port in. Follow these instructions at home:  Flush your port as told by your health care provider. If you need an infusion over several days, follow instructions from your health care provider about how   to take care of your port site. Make sure you: Wash your hands with soap and water before you change your dressing. If soap and water are not available, use alcohol-based hand sanitizer. Change your dressing as told by your health care provider. Place any used dressings or infusion bags into a plastic bag. Throw that bag in the trash. Keep the dressing that covers the needle clean and dry. Do not get it wet. Do not use scissors or sharp objects near the tube. Keep the tube clamped, unless it is being used. Check your port site every day for signs of infection. Check for: Redness, swelling, or  pain. Fluid or blood. Pus or a bad smell. Protect the skin around the port site. Avoid wearing bra straps that rub or irritate the site. Protect the skin around your port from seat belts. Place a soft pad over your chest if needed. Bathe or shower as told by your health care provider. The site may get wet as long as you are not actively receiving an infusion. Return to your normal activities as told by your health care provider. Ask your health care provider what activities are safe for you. Carry a medical alert card or wear a medical alert bracelet at all times. This will let health care providers know that you have an implanted port in case of an emergency. Get help right away if: You have redness, swelling, or pain at the port site. You have fluid or blood coming from your port site. You have pus or a bad smell coming from the port site. You have a fever. Summary Implanted ports are usually placed in the chest for long-term IV access. Follow instructions from your health care provider about flushing the port and changing bandages (dressings). Take care of the area around your port by avoiding clothing that puts pressure on the area, and by watching for signs of infection. Protect the skin around your port from seat belts. Place a soft pad over your chest if needed. Get help right away if you have a fever or you have redness, swelling, pain, drainage, or a bad smell at the port site. This information is not intended to replace advice given to you by your health care provider. Make sure you discuss any questions you have with your health care provider. Document Revised: 05/28/2020 Document Reviewed: 07/23/2019 Elsevier Patient Education  2022 [employer]  

## 2021-01-06 NOTE — Anesthesia Postprocedure Evaluation (Signed)
Anesthesia Post Note  Patient: Francisco Frank  Procedure(s) Performed: INSERTION PORT-A-CATH     Patient location during evaluation: PACU Anesthesia Type: General Level of consciousness: awake and alert Pain management: pain level controlled Vital Signs Assessment: post-procedure vital signs reviewed and stable Respiratory status: spontaneous breathing, nonlabored ventilation, respiratory function stable and patient connected to nasal cannula oxygen Cardiovascular status: blood pressure returned to baseline and stable Postop Assessment: no apparent nausea or vomiting Anesthetic complications: no   No notable events documented.  Last Vitals:  Vitals:   12/31/20 1015 12/31/20 1020  BP: 103/69   Pulse: 67   Resp: 10 16  Temp: 36.5 C   SpO2: 98%     Last Pain:  Vitals:   12/31/20 1020  TempSrc:   PainSc: 0-No pain                 Poetry Cerro

## 2021-01-07 ENCOUNTER — Inpatient Hospital Stay: Payer: Medicare Other

## 2021-01-07 ENCOUNTER — Inpatient Hospital Stay: Payer: Medicare Other | Admitting: Nurse Practitioner

## 2021-01-08 ENCOUNTER — Encounter: Payer: Self-pay | Admitting: *Deleted

## 2021-01-09 ENCOUNTER — Inpatient Hospital Stay: Payer: Medicare Other

## 2021-01-11 ENCOUNTER — Other Ambulatory Visit: Payer: Self-pay | Admitting: Oncology

## 2021-01-13 ENCOUNTER — Inpatient Hospital Stay: Payer: Medicare Other

## 2021-01-13 ENCOUNTER — Encounter: Payer: Self-pay | Admitting: *Deleted

## 2021-01-13 ENCOUNTER — Inpatient Hospital Stay (HOSPITAL_BASED_OUTPATIENT_CLINIC_OR_DEPARTMENT_OTHER): Payer: Medicare Other | Admitting: Nurse Practitioner

## 2021-01-13 ENCOUNTER — Encounter: Payer: Self-pay | Admitting: Nurse Practitioner

## 2021-01-13 ENCOUNTER — Other Ambulatory Visit: Payer: Self-pay

## 2021-01-13 VITALS — BP 119/76 | HR 88 | Temp 97.8°F | Resp 18 | Ht 68.0 in | Wt 146.4 lb

## 2021-01-13 DIAGNOSIS — C251 Malignant neoplasm of body of pancreas: Secondary | ICD-10-CM | POA: Diagnosis not present

## 2021-01-13 DIAGNOSIS — C250 Malignant neoplasm of head of pancreas: Secondary | ICD-10-CM | POA: Diagnosis not present

## 2021-01-13 LAB — CBC WITH DIFFERENTIAL (CANCER CENTER ONLY)
Abs Immature Granulocytes: 0.04 10*3/uL (ref 0.00–0.07)
Basophils Absolute: 0.1 10*3/uL (ref 0.0–0.1)
Basophils Relative: 1 %
Eosinophils Absolute: 0.5 10*3/uL (ref 0.0–0.5)
Eosinophils Relative: 6 %
HCT: 26.5 % — ABNORMAL LOW (ref 39.0–52.0)
Hemoglobin: 8.9 g/dL — ABNORMAL LOW (ref 13.0–17.0)
Immature Granulocytes: 1 %
Lymphocytes Relative: 26 %
Lymphs Abs: 2.2 10*3/uL (ref 0.7–4.0)
MCH: 29.9 pg (ref 26.0–34.0)
MCHC: 33.6 g/dL (ref 30.0–36.0)
MCV: 88.9 fL (ref 80.0–100.0)
Monocytes Absolute: 0.7 10*3/uL (ref 0.1–1.0)
Monocytes Relative: 8 %
Neutro Abs: 4.9 10*3/uL (ref 1.7–7.7)
Neutrophils Relative %: 58 %
Platelet Count: 241 10*3/uL (ref 150–400)
RBC: 2.98 MIL/uL — ABNORMAL LOW (ref 4.22–5.81)
RDW: 15.8 % — ABNORMAL HIGH (ref 11.5–15.5)
WBC Count: 8.4 10*3/uL (ref 4.0–10.5)
nRBC: 0 % (ref 0.0–0.2)

## 2021-01-13 LAB — CMP (CANCER CENTER ONLY)
ALT: 19 U/L (ref 0–44)
AST: 18 U/L (ref 15–41)
Albumin: 3.4 g/dL — ABNORMAL LOW (ref 3.5–5.0)
Alkaline Phosphatase: 92 U/L (ref 38–126)
Anion gap: 8 (ref 5–15)
BUN: 9 mg/dL (ref 8–23)
CO2: 23 mmol/L (ref 22–32)
Calcium: 8.2 mg/dL — ABNORMAL LOW (ref 8.9–10.3)
Chloride: 103 mmol/L (ref 98–111)
Creatinine: 0.82 mg/dL (ref 0.61–1.24)
GFR, Estimated: >60 mL/min (ref >60)
Glucose, Bld: 415 mg/dL — ABNORMAL HIGH (ref 70–99)
Potassium: 3.4 mmol/L — ABNORMAL LOW (ref 3.5–5.1)
Sodium: 134 mmol/L — ABNORMAL LOW (ref 135–145)
Total Bilirubin: 0.8 mg/dL (ref 0.3–1.2)
Total Protein: 5.9 g/dL — ABNORMAL LOW (ref 6.5–8.1)

## 2021-01-13 MED ORDER — INSULIN ASPART 100 UNIT/ML IJ SOLN
10.0000 [IU] | Freq: Once | INTRAMUSCULAR | Status: AC
  Administered 2021-01-13: 10 [IU] via SUBCUTANEOUS
  Filled 2021-01-13: qty 0.1

## 2021-01-13 MED ORDER — SODIUM CHLORIDE 0.9% FLUSH: 10.0000 mL | INTRAVENOUS | Status: DC | PRN

## 2021-01-13 MED ORDER — OXALIPLATIN CHEMO INJECTION 100 MG/20ML
85.0000 mg/m2 | Freq: Once | INTRAVENOUS | Status: AC
  Administered 2021-01-13: 150 mg via INTRAVENOUS
  Filled 2021-01-13: qty 20

## 2021-01-13 MED ORDER — SODIUM CHLORIDE 0.9 % IV SOLN
10.0000 mg | Freq: Once | INTRAVENOUS | Status: AC
  Administered 2021-01-13: 10 mg via INTRAVENOUS
  Filled 2021-01-13: qty 10

## 2021-01-13 MED ORDER — SODIUM CHLORIDE 0.9 % IV SOLN
150.0000 mg | Freq: Once | INTRAVENOUS | Status: AC
  Administered 2021-01-13: 150 mg via INTRAVENOUS
  Filled 2021-01-13: qty 150

## 2021-01-13 MED ORDER — LEUCOVORIN CALCIUM INJECTION 350 MG
400.0000 mg/m2 | Freq: Once | INTRAVENOUS | Status: DC
  Filled 2021-01-13: qty 35.4

## 2021-01-13 MED ORDER — LEUCOVORIN CALCIUM INJECTION 350 MG
400.0000 mg/m2 | Freq: Once | INTRAVENOUS | Status: AC
  Administered 2021-01-13: 708 mg via INTRAVENOUS
  Filled 2021-01-13: qty 35.4

## 2021-01-13 MED ORDER — DEXTROSE 5 % IV SOLN: Freq: Once | INTRAVENOUS | Status: AC

## 2021-01-13 MED ORDER — PALONOSETRON HCL INJECTION 0.25 MG/5ML
0.2500 mg | Freq: Once | INTRAVENOUS | Status: AC
  Administered 2021-01-13: 0.25 mg via INTRAVENOUS
  Filled 2021-01-13: qty 5

## 2021-01-13 MED ORDER — SODIUM CHLORIDE 0.9 % IV SOLN
2400.0000 mg/m2 | INTRAVENOUS | Status: DC
  Administered 2021-01-13: 4250 mg via INTRAVENOUS
  Filled 2021-01-13: qty 85

## 2021-01-13 NOTE — Progress Notes (Signed)
Patient presents for treatment. RN assessment completed along with the following:  Labs/vitals reviewed - Yes, and Per Ned Card, NP ok to treat with Blood glucose.     Weight within 10% of previous measurement - Yes Oncology Treatment Attestation completed for current therapy- Yes, on date 12/10/20 Informed consent completed and reflects current therapy/intent - Yes, on date 01/01/21             Provider progress note reviewed - Today's provider note is not yet available. I reviewed the most recent oncology provider progress note in chart dated 01/01/21. Treatment/Antibody/Supportive plan reviewed - Yes, and there are no adjustments needed for today's treatment. S&H and other orders reviewed - Yes, and there are no additional orders identified. Previous treatment date reviewed - Yes, and the appropriate amount of time has elapsed between treatments. Clinic Hand Off Received from - Ned Card, NP  Patient to proceed with treatment.    Blood glucose rechecked at 1445. Reading was 242. Ned Card, NP made aware. No new orders at this time.  Patient ok to be discharged home.

## 2021-01-13 NOTE — Patient Instructions (Signed)
[place]   Discharge Instructions: Thank you for choosing [facility] to provide your oncology and hematology care.   If you have a lab appointment with the [facility], please go directly to the [facility] and check in at the registration area.   Wear comfortable clothing and clothing appropriate for easy access to any Portacath or PICC line.   We strive to give you quality time with your provider. You may need to reschedule your appointment if you arrive late (15 or more minutes).  Arriving late affects you and other patients whose appointments are after yours.  Also, if you miss three or more appointments without notifying the office, you may be dismissed from the clinic at the provider's discretion.      For prescription refill requests, have your pharmacy contact our office and allow 72 hours for refills to be completed.    Today you received the following chemotherapy and/or immunotherapy agents Oxaliplatin (ELOXATIN), Leucovorin & Flourouracil (ADRUCIL).      To help prevent nausea and vomiting after your treatment, we encourage you to take your nausea medication as directed.  BELOW ARE SYMPTOMS THAT SHOULD BE REPORTED IMMEDIATELY: *FEVER GREATER THAN 100.4 F (38 C) OR HIGHER *CHILLS OR SWEATING *NAUSEA AND VOMITING THAT IS NOT CONTROLLED WITH YOUR NAUSEA MEDICATION *UNUSUAL SHORTNESS OF BREATH *UNUSUAL BRUISING OR BLEEDING *URINARY PROBLEMS (pain or burning when urinating, or frequent urination) *BOWEL PROBLEMS (unusual diarrhea, constipation, pain near the anus) TENDERNESS IN MOUTH AND THROAT WITH OR WITHOUT PRESENCE OF ULCERS (sore throat, sores in mouth, or a toothache) UNUSUAL RASH, SWELLING OR PAIN  UNUSUAL VAGINAL DISCHARGE OR ITCHING   Items with * indicate a potential emergency and should be followed up as soon as possible or go to the Emergency Department if any problems should occur.  Please show the CHEMOTHERAPY ALERT  CARD or IMMUNOTHERAPY ALERT CARD at check-in to the Emergency Department and triage nurse.  Should you have questions after your visit or need to cancel or reschedule your appointment, please contact [facility]  Dept: [phone]  and follow the prompts.  Office hours are 8:00 a.m. to 4:30 p.m. Monday - Friday. Please note that voicemails left after 4:00 p.m. may not be returned until the following business day.  We are closed weekends and major holidays. You have access to a nurse at all times for urgent questions. Please call the main number to the clinic Dept: [phone] and follow the prompts.   For any non-urgent questions, you may also contact your provider using MyChart. We now offer e-Visits for anyone 34 and older to request care online for non-urgent symptoms. For details visit [email or web address].   Also download the MyChart app! Go to the app store, search "MyChart", open the app, select [facility], and log in with your MyChart username and password.  Due to Covid, a mask is required upon entering the hospital/clinic. If you do not have a mask, one will be given to you upon arrival. For doctor visits, patients may have 1 support person aged 67 or older with them. For treatment visits, patients cannot have anyone with them due to current Covid guidelines and our immunocompromised population.   Oxaliplatin Injection What is this medication? OXALIPLATIN (ox AL i PLA tin) is a chemotherapy drug. It targets fast dividing cells, like cancer cells, and causes these cells to die. This medicine is used to treat cancers of the colon and rectum, and  many other cancers. This medicine may be used for other purposes; ask your health care provider or pharmacist if you have questions. COMMON BRAND NAME(S): Eloxatin What should I tell my care team before I take this medication? They need to know if you have any of these conditions: heart disease history of irregular  heartbeat liver disease low blood counts, like white cells, platelets, or red blood cells lung or breathing disease, like asthma take medicines that treat or prevent blood clots tingling of the fingers or toes, or other nerve disorder an unusual or allergic reaction to oxaliplatin, other chemotherapy, other medicines, foods, dyes, or preservatives pregnant or trying to get pregnant breast-feeding How should I use this medication? This drug is given as an infusion into a vein. It is administered in a hospital or clinic by a specially trained health care professional. Talk to your pediatrician regarding the use of this medicine in children. Special care may be needed. Overdosage: If you think you have taken too much of this medicine contact a poison control center or emergency room at once. NOTE: This medicine is only for you. Do not share this medicine with others. What if I miss a dose? It is important not to miss a dose. Call your doctor or health care professional if you are unable to keep an appointment. What may interact with this medication? Do not take this medicine with any of the following medications: cisapride dronedarone pimozide thioridazine This medicine may also interact with the following medications: aspirin and aspirin-like medicines certain medicines that treat or prevent blood clots like warfarin, apixaban, dabigatran, and rivaroxaban cisplatin cyclosporine diuretics medicines for infection like acyclovir, adefovir, amphotericin B, bacitracin, cidofovir, foscarnet, ganciclovir, gentamicin, pentamidine, vancomycin NSAIDs, medicines for pain and inflammation, like ibuprofen or naproxen other medicines that prolong the QT interval (an abnormal heart rhythm) pamidronate zoledronic acid This list may not describe all possible interactions. Give your health care provider a list of all the medicines, herbs, non-prescription drugs, or dietary supplements you use. Also tell  them if you smoke, drink alcohol, or use illegal drugs. Some items may interact with your medicine. What should I watch for while using this medication? Your condition will be monitored carefully while you are receiving this medicine. You may need blood work done while you are taking this medicine. This medicine may make you feel generally unwell. This is not uncommon as chemotherapy can affect healthy cells as well as cancer cells. Report any side effects. Continue your course of treatment even though you feel ill unless your healthcare professional tells you to stop. This medicine can make you more sensitive to cold. Do not drink cold drinks or use ice. Cover exposed skin before coming in contact with cold temperatures or cold objects. When out in cold weather wear warm clothing and cover your mouth and nose to warm the air that goes into your lungs. Tell your doctor if you get sensitive to the cold. Do not become pregnant while taking this medicine or for 9 months after stopping it. Women should inform their health care professional if they wish to become pregnant or think they might be pregnant. Men should not father a child while taking this medicine and for 6 months after stopping it. There is potential for serious side effects to an unborn child. Talk to your health care professional for more information. Do not breast-feed a child while taking this medicine or for 3 months after stopping it. This medicine has caused ovarian failure in  some women. This medicine may make it more difficult to get pregnant. Talk to your health care professional if you are concerned about your fertility. This medicine has caused decreased sperm counts in some men. This may make it more difficult to father a child. Talk to your health care professional if you are concerned about your fertility. This medicine may increase your risk of getting an infection. Call your health care professional for advice if you get a fever,  chills, or sore throat, or other symptoms of a cold or flu. Do not treat yourself. Try to avoid being around people who are sick. Avoid taking medicines that contain aspirin, acetaminophen, ibuprofen, naproxen, or ketoprofen unless instructed by your health care professional. These medicines may hide a fever. Be careful brushing or flossing your teeth or using a toothpick because you may get an infection or bleed more easily. If you have any dental work done, tell your dentist you are receiving this medicine. What side effects may I notice from receiving this medication? Side effects that you should report to your doctor or health care professional as soon as possible: allergic reactions like skin rash, itching or hives, swelling of the face, lips, or tongue breathing problems cough low blood counts - this medicine may decrease the number of white blood cells, red blood cells, and platelets. You may be at increased risk for infections and bleeding nausea, vomiting pain, redness, or irritation at site where injected pain, tingling, numbness in the hands or feet signs and symptoms of bleeding such as bloody or black, tarry stools; red or dark brown urine; spitting up blood or brown material that looks like coffee grounds; red spots on the skin; unusual bruising or bleeding from the eyes, gums, or nose signs and symptoms of a dangerous change in heartbeat or heart rhythm like chest pain; dizziness; fast, irregular heartbeat; palpitations; feeling faint or lightheaded; falls signs and symptoms of infection like fever; chills; cough; sore throat; pain or trouble passing urine signs and symptoms of liver injury like dark yellow or brown urine; general ill feeling or flu-like symptoms; light-colored stools; loss of appetite; nausea; right upper belly pain; unusually weak or tired; yellowing of the eyes or skin signs and symptoms of low red blood cells or anemia such as unusually weak or tired; feeling faint  or lightheaded; falls signs and symptoms of muscle injury like dark urine; trouble passing urine or change in the amount of urine; unusually weak or tired; muscle pain; back pain Side effects that usually do not require medical attention (report to your doctor or health care professional if they continue or are bothersome): changes in taste diarrhea gas hair loss loss of appetite mouth sores This list may not describe all possible side effects. Call your doctor for medical advice about side effects. You may report side effects to FDA at 1-800-FDA-1088. Where should I keep my medication? This drug is given in a hospital or clinic and will not be stored at home. NOTE: This sheet is a summary. It may not cover all possible information. If you have questions about this medicine, talk to your doctor, pharmacist, or health care provider.  2022 Elsevier/Gold Standard (2018-07-26 12:20:35)  Leucovorin injection What is this medication? LEUCOVORIN (loo koe VOR in) is used to prevent or treat the harmful effects of some medicines. This medicine is used to treat anemia caused by a low amount of folic acid in the body. It is also used with 5-fluorouracil (5-FU) to treat colon  cancer. This medicine may be used for other purposes; ask your health care provider or pharmacist if you have questions. What should I tell my care team before I take this medication? They need to know if you have any of these conditions: anemia from low levels of vitamin B-12 in the blood an unusual or allergic reaction to leucovorin, folic acid, other medicines, foods, dyes, or preservatives pregnant or trying to get pregnant breast-feeding How should I use this medication? This medicine is for injection into a muscle or into a vein. It is given by a health care professional in a hospital or clinic setting. Talk to your pediatrician regarding the use of this medicine in children. Special care may be needed. Overdosage: If you  think you have taken too much of this medicine contact a poison control center or emergency room at once. NOTE: This medicine is only for you. Do not share this medicine with others. What if I miss a dose? This does not apply. What may interact with this medication? capecitabine fluorouracil phenobarbital phenytoin primidone trimethoprim-sulfamethoxazole This list may not describe all possible interactions. Give your health care provider a list of all the medicines, herbs, non-prescription drugs, or dietary supplements you use. Also tell them if you smoke, drink alcohol, or use illegal drugs. Some items may interact with your medicine. What should I watch for while using this medication? Your condition will be monitored carefully while you are receiving this medicine. This medicine may increase the side effects of 5-fluorouracil, 5-FU. Tell your doctor or health care professional if you have diarrhea or mouth sores that do not get better or that get worse. What side effects may I notice from receiving this medication? Side effects that you should report to your doctor or health care professional as soon as possible: allergic reactions like skin rash, itching or hives, swelling of the face, lips, or tongue breathing problems fever, infection mouth sores unusual bleeding or bruising unusually weak or tired Side effects that usually do not require medical attention (report to your doctor or health care professional if they continue or are bothersome): constipation or diarrhea loss of appetite nausea, vomiting This list may not describe all possible side effects. Call your doctor for medical advice about side effects. You may report side effects to FDA at 1-800-FDA-1088. Where should I keep my medication? This drug is given in a hospital or clinic and will not be stored at home. NOTE: This sheet is a summary. It may not cover all possible information. If you have questions about this  medicine, talk to your doctor, pharmacist, or health care provider.  2022 Elsevier/Gold Standard (2007-09-12 16:50:29)  Fluorouracil, 5-FU injection What is this medication? FLUOROURACIL, 5-FU (flure oh YOOR a sil) is a chemotherapy drug. It slows the growth of cancer cells. This medicine is used to treat many types of cancer like breast cancer, colon or rectal cancer, pancreatic cancer, and stomach cancer. This medicine may be used for other purposes; ask your health care provider or pharmacist if you have questions. COMMON BRAND NAME(S): Adrucil What should I tell my care team before I take this medication? They need to know if you have any of these conditions: blood disorders dihydropyrimidine dehydrogenase (DPD) deficiency infection (especially a virus infection such as chickenpox, cold sores, or herpes) kidney disease liver disease malnourished, poor nutrition recent or ongoing radiation therapy an unusual or allergic reaction to fluorouracil, other chemotherapy, other medicines, foods, dyes, or preservatives pregnant or trying to get pregnant  breast-feeding How should I use this medication? This drug is given as an infusion or injection into a vein. It is administered in a hospital or clinic by a specially trained health care professional. Talk to your pediatrician regarding the use of this medicine in children. Special care may be needed. Overdosage: If you think you have taken too much of this medicine contact a poison control center or emergency room at once. NOTE: This medicine is only for you. Do not share this medicine with others. What if I miss a dose? It is important not to miss your dose. Call your doctor or health care professional if you are unable to keep an appointment. What may interact with this medication? Do not take this medicine with any of the following medications: live virus vaccines This medicine may also interact with the following medications: medicines  that treat or prevent blood clots like warfarin, enoxaparin, and dalteparin This list may not describe all possible interactions. Give your health care provider a list of all the medicines, herbs, non-prescription drugs, or dietary supplements you use. Also tell them if you smoke, drink alcohol, or use illegal drugs. Some items may interact with your medicine. What should I watch for while using this medication? Visit your doctor for checks on your progress. This drug may make you feel generally unwell. This is not uncommon, as chemotherapy can affect healthy cells as well as cancer cells. Report any side effects. Continue your course of treatment even though you feel ill unless your doctor tells you to stop. In some cases, you may be given additional medicines to help with side effects. Follow all directions for their use. Call your doctor or health care professional for advice if you get a fever, chills or sore throat, or other symptoms of a cold or flu. Do not treat yourself. This drug decreases your body's ability to fight infections. Try to avoid being around people who are sick. This medicine may increase your risk to bruise or bleed. Call your doctor or health care professional if you notice any unusual bleeding. Be careful brushing and flossing your teeth or using a toothpick because you may get an infection or bleed more easily. If you have any dental work done, tell your dentist you are receiving this medicine. Avoid taking products that contain aspirin, acetaminophen, ibuprofen, naproxen, or ketoprofen unless instructed by your doctor. These medicines may hide a fever. Do not become pregnant while taking this medicine. Women should inform their doctor if they wish to become pregnant or think they might be pregnant. There is a potential for serious side effects to an unborn child. Talk to your health care professional or pharmacist for more information. Do not breast-feed an infant while taking  this medicine. Men should inform their doctor if they wish to father a child. This medicine may lower sperm counts. Do not treat diarrhea with over the counter products. Contact your doctor if you have diarrhea that lasts more than 2 days or if it is severe and watery. This medicine can make you more sensitive to the sun. Keep out of the sun. If you cannot avoid being in the sun, wear protective clothing and use sunscreen. Do not use sun lamps or tanning beds/booths. What side effects may I notice from receiving this medication? Side effects that you should report to your doctor or health care professional as soon as possible: allergic reactions like skin rash, itching or hives, swelling of the face, lips, or tongue low blood  counts - this medicine may decrease the number of white blood cells, red blood cells and platelets. You may be at increased risk for infections and bleeding. signs of infection - fever or chills, cough, sore throat, pain or difficulty passing urine signs of decreased platelets or bleeding - bruising, pinpoint red spots on the skin, black, tarry stools, blood in the urine signs of decreased red blood cells - unusually weak or tired, fainting spells, lightheadedness breathing problems changes in vision chest pain mouth sores nausea and vomiting pain, swelling, redness at site where injected pain, tingling, numbness in the hands or feet redness, swelling, or sores on hands or feet stomach pain unusual bleeding Side effects that usually do not require medical attention (report to your doctor or health care professional if they continue or are bothersome): changes in finger or toe nails diarrhea dry or itchy skin hair loss headache loss of appetite sensitivity of eyes to the light stomach upset unusually teary eyes This list may not describe all possible side effects. Call your doctor for medical advice about side effects. You may report side effects to FDA at  1-800-FDA-1088. Where should I keep my medication? This drug is given in a hospital or clinic and will not be stored at home. NOTE: This sheet is a summary. It may not cover all possible information. If you have questions about this medicine, talk to your doctor, pharmacist, or health care provider.  2022 Elsevier/Gold Standard (2019-02-06 15:00:03)  The chemotherapy medication bag should finish at 46 hours, 96 hours, or 7 days. For example, if your pump is scheduled for 46 hours and it was put on at 4:00 p.m., it should finish at 2:00 p.m. the day it is scheduled to come off regardless of your appointment time.     Estimated time to finish at 11:30 a.m. on Saturday 01/03/2021.   If the display on your pump reads "Low Volume" and it is beeping, take the batteries out of the pump and come to the cancer center for it to be taken off.   If the pump alarms go off prior to the pump reading "Low Volume" then call [phone] and someone can assist you.  If the plunger comes out and the chemotherapy medication is leaking out, please use your home chemo spill kit to clean up the spill. Do NOT use paper towels or other household products.  If you have problems or questions regarding your pump, please call either [phone] (24 hours a day) or the cancer center Monday-Friday 8:00 a.m.- 4:30 p.m. at the clinic number and we will assist you. If you are unable to get assistance, then go to the nearest Emergency Department and ask the staff to contact the IV team for assistance.

## 2021-01-13 NOTE — Progress Notes (Signed)
[facility] OFFICE PROGRESS NOTE   Diagnosis: Pancreas cancer  INTERVAL HISTORY:   Francisco Frank returns as scheduled.  He completed cycle 1 FOLFIRINOX 01/01/2021.  He became "queasy" around day 4/5.  No vomiting.  All medications helped.  Mouth became tender.  He did not develop ulcers.  No diarrhea.  He did not experience cold sensitivity.  He has an appointment with the drain clinic regarding the cholecystostomy tube next week.  Objective:  Vital signs in last 24 hours:  Blood pressure 119/76, pulse 88, temperature 97.8 F (36.6 C), temperature source Oral, resp. rate 18, height 5\' 8"  (1.727 m), weight 146 lb 6.4 oz (66.4 kg), SpO2 100 %.    HEENT: No thrush or ulcers.  Sclera anicteric. Resp: Lungs clear bilaterally. Cardio: Regular rate and rhythm. GI: Abdomen soft and nontender.  No hepatosplenomegaly.  Right upper quadrant cholecystostomy tube. Vascular: No leg edema. Port-A-Cath without erythema.  Lab Results:  Lab Results  Component Value Date   WBC 8.4 01/13/2021   HGB 8.9 (L) 01/13/2021   HCT 26.5 (L) 01/13/2021   MCV 88.9 01/13/2021   PLT 241 01/13/2021   NEUTROABS 4.9 01/13/2021    Imaging:  No results found.  Medications: I have reviewed the patient's current medications.  Assessment/Plan: Pancreas cancer Outside CT-apparent pancreas abnormality (we do not have a copy of the report) MRI abdomen 11/06/2020-pancreatic body and tail atrophy with duct dilatation up to 9 mm.  Both ducts undergo an abrupt transition in the region of the pancreatic head.  Non border deforming pancreatic head mass measuring 3.2 x 2.6 cm.  No arterial involvement by tumor.  SMV adjacent to presumed tumor without encasement.  Portal vein uninvolved.  No abdominal adenopathy.  Left periaortic 7 mm node not pathologic by size criteria.  No ascites.  Subtle left-sided pericolonic nodule 5 mm. EUS/ERCP 11/11/2020-EGD showed gastritis which was biopsied, mucosal changes in  the duodenum, mucosal changes in the duodenum sweep (gastric antral and oxyntic mucosa with no specific histopathologic changes; Warthin Starry stain negative for H pylori).  EUS showed an irregular mass in the pancreatic head measuring 26 mm x 30 mm; the outer margins were irregular.  There was sonographic evidence suggesting invasion into the portal vein (manifested by abutment) and the superior mesenteric vein (manifested by abutment); an intact interface was seen between the mass and the superior mesenteric artery and celiac trunk suggesting a lack of invasion.  Endosonographic imaging in the visualized portion of the liver showed no mass.  There was dilatation of the common bile duct and the common hepatic duct.  No malignant appearing lymph nodes were visualized in the celiac region, peripancreatic region, porta hepatis region.  FNA biopsy of the pancreas head mass showed malignant cells consistent with adenocarcinoma.  Biliary access could not be obtained.  Cholangiogram 11/12/2020-severe intrahepatic biliary dilatation.  A peripheral right hepatic bile duct was successfully cannulated.  Internal/external biliary drain placed.  Cytology on biliary drain bile showed malignant cells consistent with adenocarcinoma.   CT chest 11/13/2020-no evidence of metastatic disease.   CT pelvis 11/13/2020-nonspecific circumferential wall thickening of the cecum measuring up to 12 mm in thickness.  Otherwise no evidence of intrapelvic metastases.   11/19/2020-biliary stent placement, biliary drain exchange 11/28/2020-exchange of internal/external biliary drain secondary to persistent jaundice CT abdomen/pelvis 11/28/2020-pancreas head mass, 3.5 x 2.8 cm, no encasement of the celiac trunk or SMA, partial encasement of the portal venous confluence-at least 180 degrees, partial encasement of the SMV-approximately  180 degrees, ventral extension of tumor abuts the posterior duodenum, no adenopathy, percutaneous biliary stent with  decompression of previous dilated intrahepatic and common bile ducts Cycle 1 FOLFIRINOX 01/01/2021, irinotecan held Cycle 2 FOLFIRINOX 01/13/2021, irinotecan held   Painless jaundice-internal/external biliary drain placed 11/12/2020.  Biliary stent placed and drain exchange 11/19/2020, exchange of internal/external drain 11/28/2020 Weight loss Diabetes diagnosed June 2022 Change in bowel habits February 2022 Colonoscopy 08/06/2020-8 mm polyp in the sigmoid colon, a few small and large mouth diverticula in the sigmoid colon, normal mucosa found in the entire colon with biopsies for histology taken with a cold forceps from the right colon and left colon for evaluation of microscopic colitis (right colon-colonic mucosa with no significant pathologic findings; left colon-colonic mucosa with no significant pathologic findings; sigmoid polyp-tubular adenoma, negative for high-grade dysplasia). Hypertension Admission 12/12/2020 with acute cholecystitis, cholecystostomy tube 12/17/2020      Disposition: Francisco Frank appears stable.  He has completed 1 cycle of FOLFIRINOX.  Irinotecan was held with that cycle.  Plan to proceed with cycle 2 today as scheduled again holding irinotecan.  Plan to add irinotecan with cycle 3 if he tolerates cycle 2 well.  We reviewed the CBC and chemistry panel from today.  Labs adequate to proceed with treatment.  Serum glucose returned at 415.  We are giving 10 units of NovoLog in the office today and will repeat a CBG in approximately 2 hours.  He will monitor blood sugar closely over the next several days.  He understands the dexamethasone premedication will likely increase the blood sugar further.  He has an appointment with Interventional Radiology regarding the cholecystostomy tube 01/22/2021.  He will return for lab, follow-up, chemotherapy in 2 weeks.  He will contact the office in the interim with any problems.    Ned Card ANP/GNP-BC   01/13/2021  10:39  AM

## 2021-01-13 NOTE — Patient Instructions (Signed)
Implanted Port Home Guide An implanted port is a device that is placed under the skin. It is usually placed in the chest. The device can be used to give IV medicine, to take blood, or for dialysis. You may have an implanted port if: You need IV medicine that would be irritating to the small veins in your hands or arms. You need IV medicines, such as antibiotics, for a long period of time. You need IV nutrition for a long period of time. You need dialysis. When you have a port, your health care provider can choose to use the port instead of veins in your arms for these procedures. You may have fewer limitations when using a port than you would if you used other types of long-term IVs, and you will likely be able to return to normal activities after your incision heals. An implanted port has two main parts: Reservoir. The reservoir is the part where a needle is inserted to give medicines or draw blood. The reservoir is round. After it is placed, it appears as a small, raised area under your skin. Catheter. The catheter is a thin, flexible tube that connects the reservoir to a vein. Medicine that is inserted into the reservoir goes into the catheter and then into the vein. How is my port accessed? To access your port: A numbing cream may be placed on the skin over the port site. Your health care provider will put on a mask and sterile gloves. The skin over your port will be cleaned carefully with a germ-killing soap and allowed to dry. Your health care provider will gently pinch the port and insert a needle into it. Your health care provider will check for a blood return to make sure the port is in the vein and is not clogged. If your port needs to remain accessed to get medicine continuously (constant infusion), your health care provider will place a clear bandage (dressing) over the needle site. The dressing and needle will need to be changed every week, or as told by your health care provider. What  is flushing? Flushing helps keep the port from getting clogged. Follow instructions from your health care provider about how and when to flush the port. Ports are usually flushed with saline solution or a medicine called heparin. The need for flushing will depend on how the port is used: If the port is only used from time to time to give medicines or draw blood, the port may need to be flushed: Before and after medicines have been given. Before and after blood has been drawn. As part of routine maintenance. Flushing may be recommended every 4-6 weeks. If a constant infusion is running, the port may not need to be flushed. Throw away any syringes in a disposal container that is meant for sharp items (sharps container). You can buy a sharps container from a pharmacy, or you can make one by using an empty hard plastic bottle with a cover. How long will my port stay implanted? The port can stay in for as long as your health care provider thinks it is needed. When it is time for the port to come out, a surgery will be done to remove it. The surgery will be similar to the procedure that was done to put the port in. Follow these instructions at home:  Flush your port as told by your health care provider. If you need an infusion over several days, follow instructions from your health care provider about how   to take care of your port site. Make sure you: Wash your hands with soap and water before you change your dressing. If soap and water are not available, use alcohol-based hand sanitizer. Change your dressing as told by your health care provider. Place any used dressings or infusion bags into a plastic bag. Throw that bag in the trash. Keep the dressing that covers the needle clean and dry. Do not get it wet. Do not use scissors or sharp objects near the tube. Keep the tube clamped, unless it is being used. Check your port site every day for signs of infection. Check for: Redness, swelling, or  pain. Fluid or blood. Pus or a bad smell. Protect the skin around the port site. Avoid wearing bra straps that rub or irritate the site. Protect the skin around your port from seat belts. Place a soft pad over your chest if needed. Bathe or shower as told by your health care provider. The site may get wet as long as you are not actively receiving an infusion. Return to your normal activities as told by your health care provider. Ask your health care provider what activities are safe for you. Carry a medical alert card or wear a medical alert bracelet at all times. This will let health care providers know that you have an implanted port in case of an emergency. Get help right away if: You have redness, swelling, or pain at the port site. You have fluid or blood coming from your port site. You have pus or a bad smell coming from the port site. You have a fever. Summary Implanted ports are usually placed in the chest for long-term IV access. Follow instructions from your health care provider about flushing the port and changing bandages (dressings). Take care of the area around your port by avoiding clothing that puts pressure on the area, and by watching for signs of infection. Protect the skin around your port from seat belts. Place a soft pad over your chest if needed. Get help right away if you have a fever or you have redness, swelling, pain, drainage, or a bad smell at the port site. This information is not intended to replace advice given to you by your health care provider. Make sure you discuss any questions you have with your health care provider. Document Revised: 05/28/2020 Document Reviewed: 07/23/2019 Elsevier Patient Education  2022 [employer]  

## 2021-01-13 NOTE — Progress Notes (Signed)
PATIENT NAVIGATOR PROGRESS NOTE  Name: Francisco Frank Date: 01/13/2021 MRN: [id number]  DOB: [birth year]   Reason for visit:  Reconciling medications  Comments:  Met with Mr and Mrs Bulson to review and update medications and answer questions    Time spent counseling/coordinating care: < 15 minutes

## 2021-01-15 ENCOUNTER — Other Ambulatory Visit: Payer: Self-pay

## 2021-01-15 ENCOUNTER — Inpatient Hospital Stay: Payer: Medicare Other

## 2021-01-15 VITALS — BP 125/79 | HR 50 | Temp 98.0°F | Resp 17

## 2021-01-15 DIAGNOSIS — C251 Malignant neoplasm of body of pancreas: Secondary | ICD-10-CM | POA: Diagnosis not present

## 2021-01-15 DIAGNOSIS — C250 Malignant neoplasm of head of pancreas: Secondary | ICD-10-CM

## 2021-01-15 MED ORDER — SODIUM CHLORIDE 0.9% FLUSH
10.0000 mL | INTRAVENOUS | Status: DC | PRN
  Administered 2021-01-15: 10 mL

## 2021-01-15 MED ORDER — HEPARIN SOD (PORK) LOCK FLUSH 100 UNIT/ML IV SOLN
500.0000 [IU] | Freq: Once | INTRAVENOUS | Status: AC | PRN
  Administered 2021-01-15: 500 [IU]

## 2021-01-22 ENCOUNTER — Other Ambulatory Visit (HOSPITAL_COMMUNITY): Payer: Self-pay | Admitting: Interventional Radiology

## 2021-01-22 ENCOUNTER — Other Ambulatory Visit (HOSPITAL_COMMUNITY): Payer: Self-pay | Admitting: Physician Assistant

## 2021-01-22 ENCOUNTER — Ambulatory Visit (HOSPITAL_COMMUNITY)
Admission: RE | Admit: 2021-01-22 | Discharge: 2021-01-22 | Disposition: A | Discharge status: Home / Self Care | Payer: Medicare Other | Source: Ambulatory Visit | Attending: Physician Assistant | Admitting: Physician Assistant

## 2021-01-22 ENCOUNTER — Other Ambulatory Visit: Payer: Self-pay

## 2021-01-22 DIAGNOSIS — C250 Malignant neoplasm of head of pancreas: Secondary | ICD-10-CM | POA: Insufficient documentation

## 2021-01-22 DIAGNOSIS — Z434 Encounter for attention to other artificial openings of digestive tract: Secondary | ICD-10-CM | POA: Diagnosis present

## 2021-01-22 DIAGNOSIS — K810 Acute cholecystitis: Secondary | ICD-10-CM

## 2021-01-22 HISTORY — PX: IR CHOLANGIOGRAM EXISTING TUBE: IMG6040

## 2021-01-22 IMAGING — XA IR CHOLANGIOGRAM VIA EXIST CATHETER
1 series · 4 of 4 positions shown · non-contrast
Comparison: Image guided cholecystostomy tube placement-[DATE].

INDICATION: History of pancreatic head mass ultimately requiring placement of an
internal/external biliary drainage catheter on [DATE].

[Series 1: ir cholangiogram existing tube · 4 of 4 slices shown]
[im 1/4]
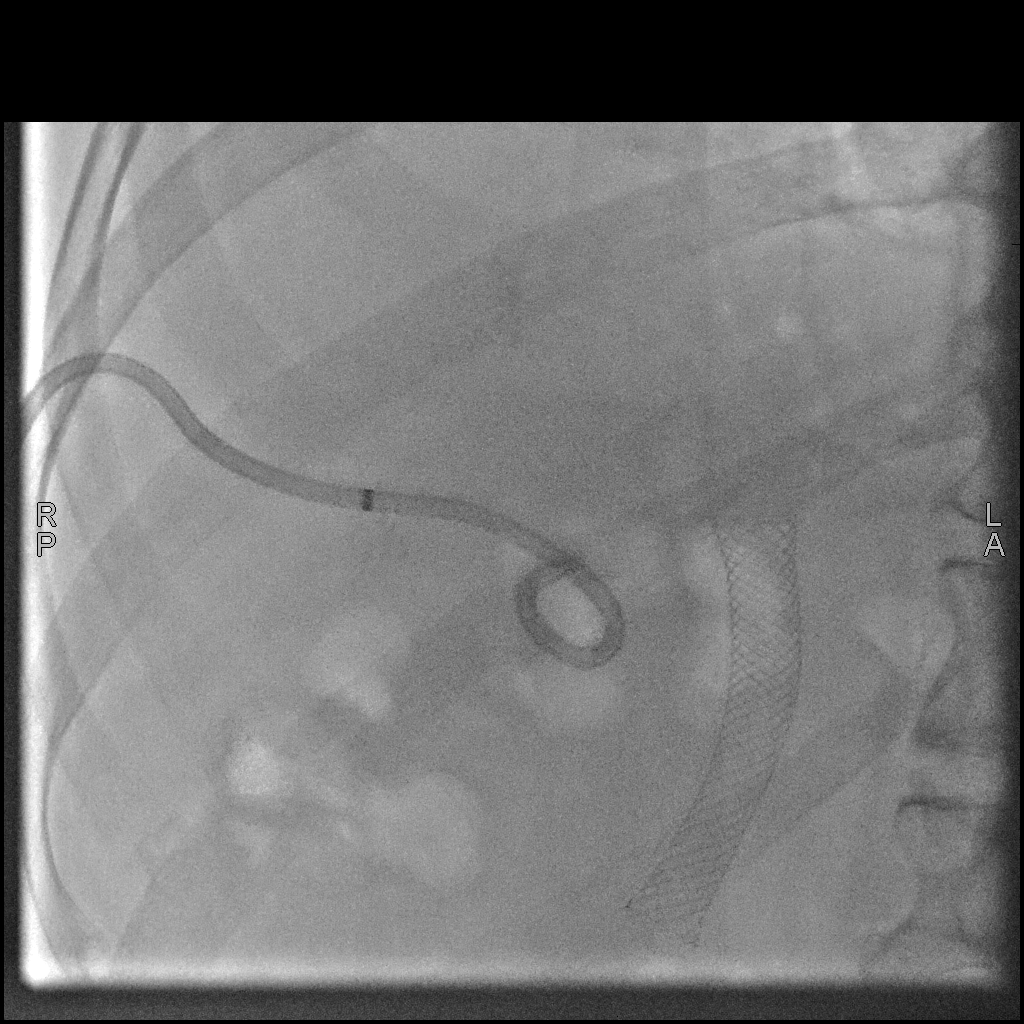
[im 2/4]
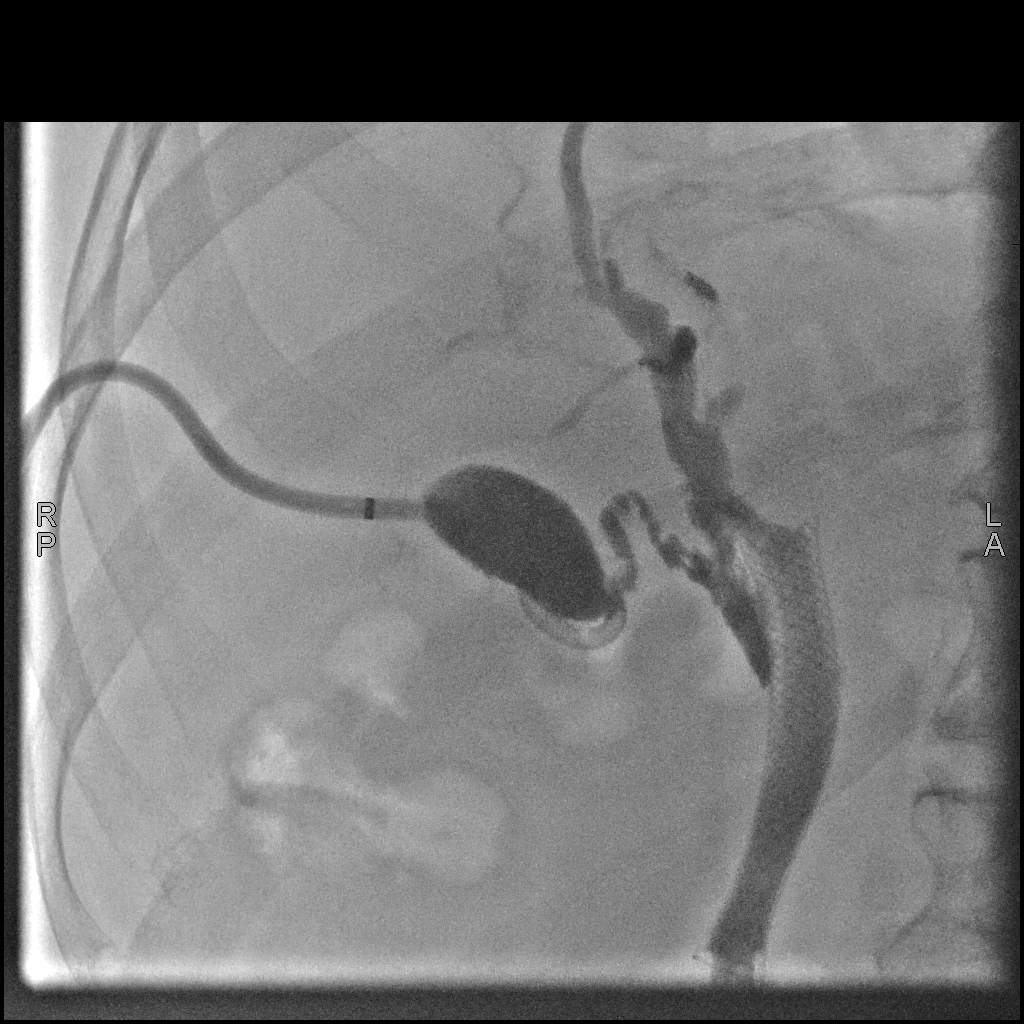
[im 3/4]
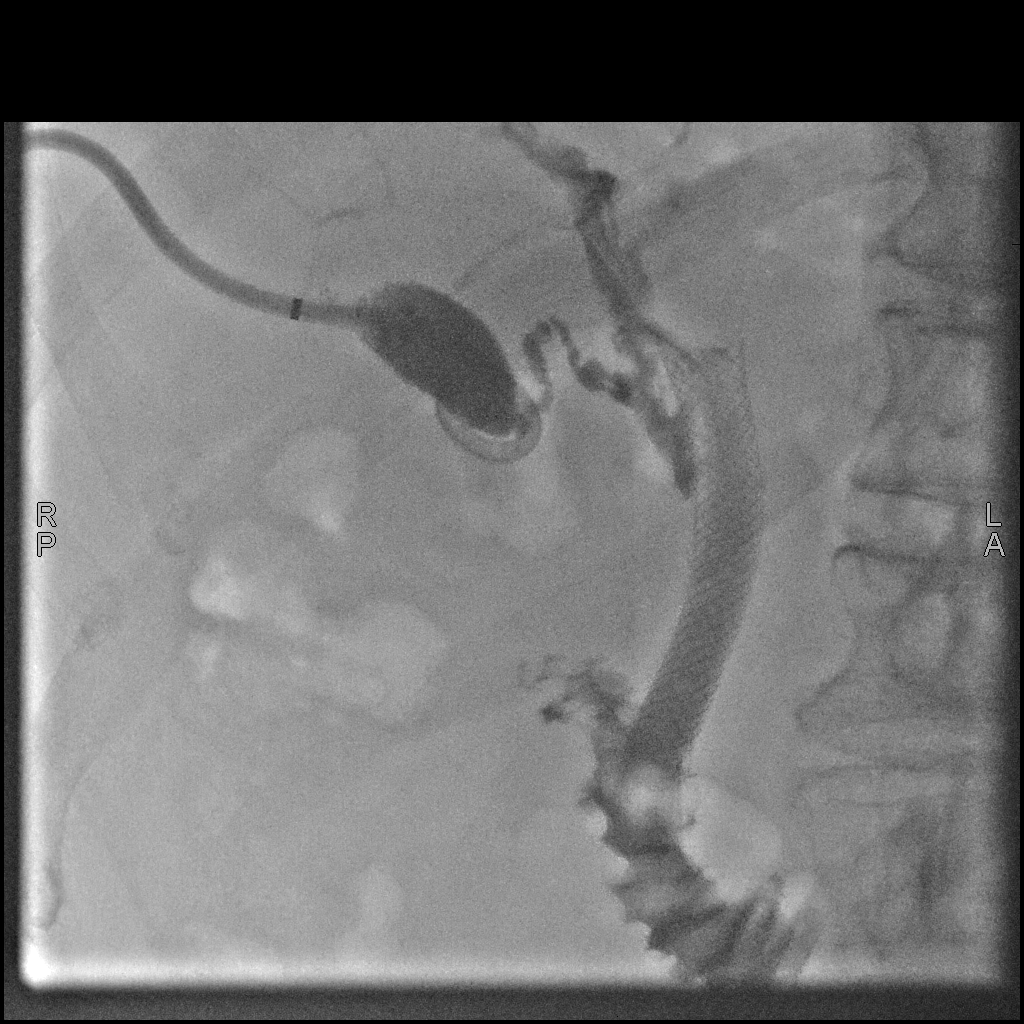
[im 4/4]
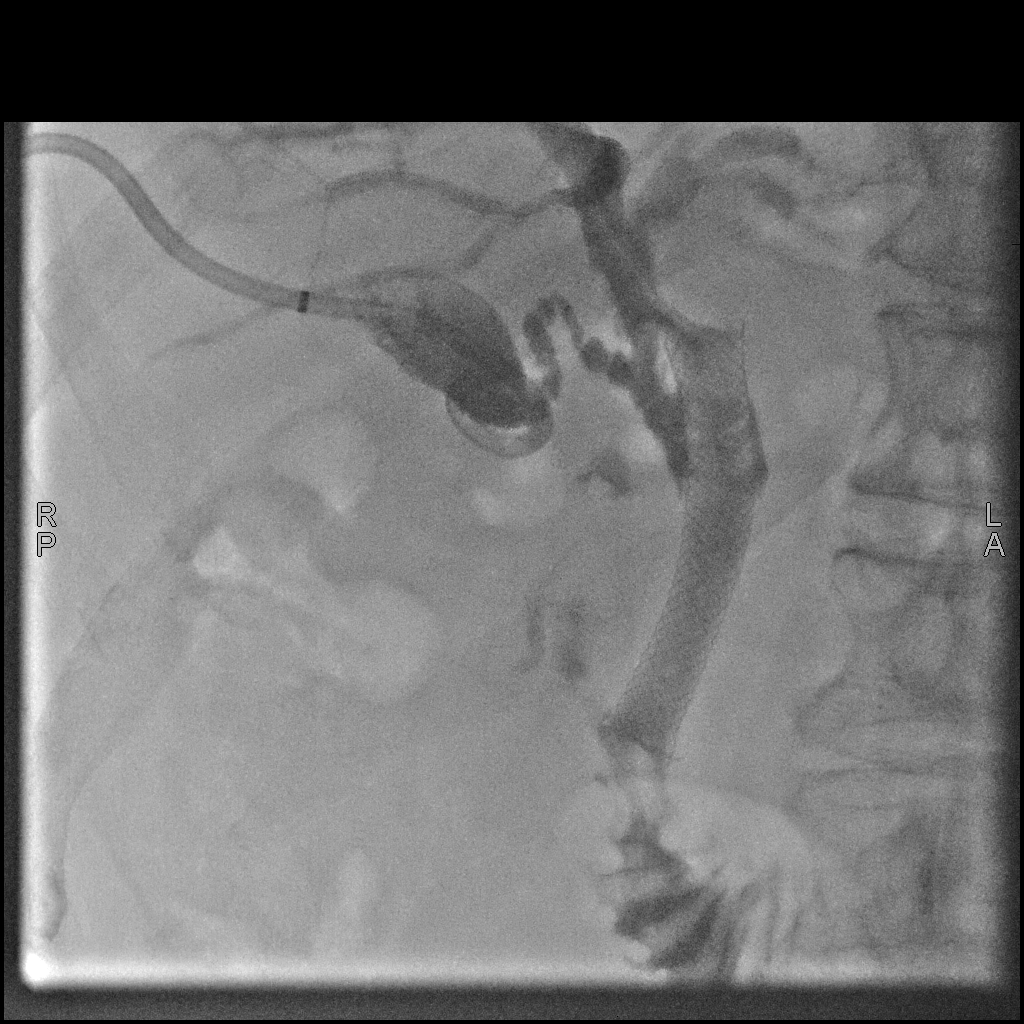

[4 of 4 positions shown; findings below may reference images not displayed]

The patient subsequently underwent biliary stent placement on
[DATE] with subsequent removal of the internal/external biliary
drainage catheter on [DATE].

Unfortunately, the patient developed postprocedural cul
cholecystitis requiring placement of a percutaneous cholecystostomy
tube on [DATE].

Patient presents today for cholecystostomy tube injection,
evaluation and management.

The patient is currently tolerating cholecystostomy tube well and is
without complaint. Specifically, no recurrent right upper quadrant
abdominal pain. No fever or chills.

EXAM:
FLUOROSCOPIC GUIDED CHOLECYSTOSTOMY TUBE INJECTION
MEDICATIONS:
None

ANESTHESIA/SEDATION:
None

CONTRAST:  10 cc Omnipaque 300 - administered into the gallbladder
fossa.

FLUOROSCOPY TIME:  37 seconds (5 mGy)

COMPLICATIONS:
None immediate.

PROCEDURE:
The patient was positioned supine on the fluoroscopy table.

A preprocedural spot fluoroscopic image was obtained of the right
upper abdominal quadrant existing cholecystostomy tube.

Multiple spot fluoroscopic radiographic images were obtained of the
right upper abdominal quadrant an existing cholecystostomy tube
following injection of a small amount of contrast. Images were
reviewed and discussed with the patient.

The cholecystostomy tube was flushed with a small amount of saline
and capped. A dressing was applied. The patient tolerated the
procedure well without immediate postprocedural complication.
FINDINGS: Preprocedural spot fluoroscopic image of the right upper abdominal
quadrant demonstrates grossly unchanged positioning of the
cholecystostomy tube with end coiled and locked overlying the
expected location of the gallbladder fundus. Internal biliary stent
overlies expected location of the CBD

Contrast injection demonstrates brisk opacification of the
gallbladder lumen with passage of contrast through the cystic and
common bile ducts as well as the internal biliary stent to the level
of the duodenum.

There are no discrete intraluminal filling defect to suggest the
presence of cholelithiasis or choledocholithiasis.
IMPRESSION: 1. Appropriately positioned and functioning cholecystostomy tube. No
exchange performed.
2. No evidence of cholelithiasis or choledocholithiasis.

PLAN:
The patient's cholecystostomy tube was capped for a trial of
internalization. The patient was instructed to no longer flush the
cholecystostomy tube.

The patient was given an extra gravity bag and instructed to
reconnect the cholecystostomy tube to the gravity bag if he were to
experience recurrent right upper quadrant obstructive symptoms. The
patient demonstrated fair understanding of this discussion.

At this point, the patient is hesitant to undergo cholecystostomy
tube removal as he is in the midst of treatment for pancreatic
cancer.

As such, we will proceed with routine fluoroscopic guided exchange
early next month, 2 months following the initial cholecystostomy
tube placement.

If the patient passes his capping trial, the decision to remove the
cholecystostomy tube may be performed at his discretion.

## 2021-01-22 MED ORDER — IOHEXOL 300 MG/ML  SOLN: 10.0000 mL | Freq: Once | INTRAMUSCULAR | Status: DC | PRN

## 2021-01-22 NOTE — Procedures (Signed)
Pre procedural Dx: Cholecystomy tube. Post procedural Dx: Same  Appropriately positioned and functioning cholecystostomy tube with restored patency of the cystic duct.  No evidence of cholelithiasis or choledocholithiasis.   PLAN:   - The patient's cholecystostomy tube was capped for a trial of internalization.  The patient was instructed to no longer flush the cholecystostomy tube.  The patient was given an extra gravity bag and instructed to reconnect the cholecystostomy tube to the gravity bag if he were to experience recurrent right upper quadrant obstructive symptoms.  The patient demonstrated fair understanding of this discussion.    - At this point, the patient is hesitant to undergo cholecystostomy tube removal as he is in the midst of treatment for pancreatic cancer.    -  As such, we will proceed with routine fluoroscopic guided exchange early next month, 2 months following the initial cholecystostomy tube placement.    - If the patient passes his capping trial, the decision to remove the cholecystostomy tube may be performed at his discretion.  Ronny Bacon, MD Pager #: [phone]

## 2021-01-25 ENCOUNTER — Other Ambulatory Visit: Payer: Self-pay | Admitting: Oncology

## 2021-01-28 ENCOUNTER — Other Ambulatory Visit: Payer: Self-pay

## 2021-01-28 ENCOUNTER — Telehealth: Payer: Self-pay

## 2021-01-28 ENCOUNTER — Inpatient Hospital Stay: Payer: Medicare Other | Admitting: Nutrition

## 2021-01-28 ENCOUNTER — Inpatient Hospital Stay: Payer: Medicare Other

## 2021-01-28 ENCOUNTER — Inpatient Hospital Stay: Payer: Medicare Other | Attending: Nurse Practitioner

## 2021-01-28 ENCOUNTER — Inpatient Hospital Stay (HOSPITAL_BASED_OUTPATIENT_CLINIC_OR_DEPARTMENT_OTHER): Payer: Medicare Other | Admitting: Oncology

## 2021-01-28 VITALS — BP 130/88 | HR 55 | Temp 97.8°F | Resp 18 | Ht 68.0 in | Wt 149.2 lb

## 2021-01-28 DIAGNOSIS — C250 Malignant neoplasm of head of pancreas: Secondary | ICD-10-CM

## 2021-01-28 DIAGNOSIS — C251 Malignant neoplasm of body of pancreas: Secondary | ICD-10-CM | POA: Diagnosis present

## 2021-01-28 DIAGNOSIS — Z5111 Encounter for antineoplastic chemotherapy: Secondary | ICD-10-CM | POA: Insufficient documentation

## 2021-01-28 LAB — CBC WITH DIFFERENTIAL (CANCER CENTER ONLY)
Abs Immature Granulocytes: 0.02 10*3/uL (ref 0.00–0.07)
Basophils Absolute: 0.1 10*3/uL (ref 0.0–0.1)
Basophils Relative: 2 %
Eosinophils Absolute: 0.5 10*3/uL (ref 0.0–0.5)
Eosinophils Relative: 7 %
HCT: 29.3 % — ABNORMAL LOW (ref 39.0–52.0)
Hemoglobin: 9.5 g/dL — ABNORMAL LOW (ref 13.0–17.0)
Immature Granulocytes: 0 %
Lymphocytes Relative: 31 %
Lymphs Abs: 2.1 10*3/uL (ref 0.7–4.0)
MCH: 30 pg (ref 26.0–34.0)
MCHC: 32.4 g/dL (ref 30.0–36.0)
MCV: 92.4 fL (ref 80.0–100.0)
Monocytes Absolute: 0.6 10*3/uL (ref 0.1–1.0)
Monocytes Relative: 9 %
Neutro Abs: 3.5 10*3/uL (ref 1.7–7.7)
Neutrophils Relative %: 51 %
Platelet Count: 224 10*3/uL (ref 150–400)
RBC: 3.17 MIL/uL — ABNORMAL LOW (ref 4.22–5.81)
RDW: 15.9 % — ABNORMAL HIGH (ref 11.5–15.5)
WBC Count: 6.9 10*3/uL (ref 4.0–10.5)
nRBC: 0 % (ref 0.0–0.2)

## 2021-01-28 LAB — CMP (CANCER CENTER ONLY)
ALT: 26 U/L (ref 0–44)
AST: 33 U/L (ref 15–41)
Albumin: 3.4 g/dL — ABNORMAL LOW (ref 3.5–5.0)
Alkaline Phosphatase: 94 U/L (ref 38–126)
Anion gap: 9 (ref 5–15)
BUN: 12 mg/dL (ref 8–23)
CO2: 23 mmol/L (ref 22–32)
Calcium: 8 mg/dL — ABNORMAL LOW (ref 8.9–10.3)
Chloride: 109 mmol/L (ref 98–111)
Creatinine: 0.9 mg/dL (ref 0.61–1.24)
GFR, Estimated: >60 mL/min (ref >60)
Glucose, Bld: 239 mg/dL — ABNORMAL HIGH (ref 70–99)
Potassium: 3.6 mmol/L (ref 3.5–5.1)
Sodium: 141 mmol/L (ref 135–145)
Total Bilirubin: 0.5 mg/dL (ref 0.3–1.2)
Total Protein: 5.8 g/dL — ABNORMAL LOW (ref 6.5–8.1)

## 2021-01-28 MED ORDER — DEXTROSE 5 % IV SOLN: Freq: Once | INTRAVENOUS | Status: AC

## 2021-01-28 MED ORDER — SODIUM CHLORIDE 0.9 % IV SOLN
150.0000 mg/m2 | Freq: Once | INTRAVENOUS | Status: AC
  Administered 2021-01-28: 260 mg via INTRAVENOUS
  Filled 2021-01-28: qty 13

## 2021-01-28 MED ORDER — PALONOSETRON HCL INJECTION 0.25 MG/5ML
0.2500 mg | Freq: Once | INTRAVENOUS | Status: AC
  Administered 2021-01-28: 0.25 mg via INTRAVENOUS
  Filled 2021-01-28: qty 5

## 2021-01-28 MED ORDER — SODIUM CHLORIDE 0.9 % IV SOLN
10.0000 mg | Freq: Once | INTRAVENOUS | Status: AC
  Administered 2021-01-28: 10 mg via INTRAVENOUS
  Filled 2021-01-28: qty 1

## 2021-01-28 MED ORDER — SODIUM CHLORIDE 0.9 % IV SOLN
400.0000 mg/m2 | Freq: Once | INTRAVENOUS | Status: AC
  Administered 2021-01-28: 708 mg via INTRAVENOUS
  Filled 2021-01-28: qty 35.4

## 2021-01-28 MED ORDER — SODIUM CHLORIDE 0.9 % IV SOLN
150.0000 mg | Freq: Once | INTRAVENOUS | Status: AC
  Administered 2021-01-28: 150 mg via INTRAVENOUS
  Filled 2021-01-28: qty 5

## 2021-01-28 MED ORDER — SODIUM CHLORIDE 0.9 % IV SOLN
2400.0000 mg/m2 | INTRAVENOUS | Status: DC
  Administered 2021-01-28: 4250 mg via INTRAVENOUS
  Filled 2021-01-28: qty 85

## 2021-01-28 MED ORDER — ATROPINE SULFATE 1 MG/ML IV SOLN
0.5000 mg | Freq: Once | INTRAVENOUS | Status: DC | PRN
  Filled 2021-01-28: qty 1

## 2021-01-28 MED ORDER — OXALIPLATIN CHEMO INJECTION 100 MG/20ML
85.0000 mg/m2 | Freq: Once | INTRAVENOUS | Status: AC
  Administered 2021-01-28: 150 mg via INTRAVENOUS
  Filled 2021-01-28: qty 10

## 2021-01-28 NOTE — Progress Notes (Signed)
[facility] OFFICE PROGRESS NOTE   Diagnosis: Pancreas cancer  INTERVAL HISTORY:   Francisco Frank returns as scheduled.  He completed another cycle of FOLFOX on 01/13/2021.  He did not have nausea following the cycle of chemotherapy.  He had cold sensitivity.  This has resolved.  No other neuropathy symptoms.  He has loose stool, but no diarrhea.  The biliary drain remains in place.  The drain is capped.  Objective:  Vital signs in last 24 hours:  Blood pressure 130/88, pulse (!) 55, temperature 97.8 F (36.6 C), resp. rate 18, height 5\' 8"  (1.727 m), weight 149 lb 3.2 oz (67.7 kg), SpO2 100 %.    HEENT: No thrush or ulcers Resp: Lungs clear bilaterally Cardio: Regular rate and rhythm GI: No hepatomegaly, no mass, nontender, right upper quadrant biliary drain with a gauze dressing Vascular: No leg edema   Portacath/PICC-without erythema  Lab Results:  Lab Results  Component Value Date   WBC 6.9 01/28/2021   HGB 9.5 (L) 01/28/2021   HCT 29.3 (L) 01/28/2021   MCV 92.4 01/28/2021   PLT 224 01/28/2021   NEUTROABS 3.5 01/28/2021    CMP  Lab Results  Component Value Date   NA 134 (L) 01/13/2021   K 3.4 (L) 01/13/2021   CL 103 01/13/2021   CO2 23 01/13/2021   GLUCOSE 415 (H) 01/13/2021   BUN 9 01/13/2021   CREATININE 0.82 01/13/2021   CALCIUM 8.2 (L) 01/13/2021   PROT 5.9 (L) 01/13/2021   ALBUMIN 3.4 (L) 01/13/2021   AST 18 01/13/2021   ALT 19 01/13/2021   ALKPHOS 92 01/13/2021   BILITOT 0.8 01/13/2021   GFRNONAA >60 01/13/2021    Lab Results  Component Value Date   OJJ009 1,048 (H) 01/01/2021     Medications: I have reviewed the patient's current medications.   Assessment/Plan: Pancreas cancer Outside CT-apparent pancreas abnormality (we do not have a copy of the report) MRI abdomen 11/06/2020-pancreatic body and tail atrophy with duct dilatation up to 9 mm.  Both ducts undergo an abrupt transition in the region of the pancreatic head.  Non  border deforming pancreatic head mass measuring 3.2 x 2.6 cm.  No arterial involvement by tumor.  SMV adjacent to presumed tumor without encasement.  Portal vein uninvolved.  No abdominal adenopathy.  Left periaortic 7 mm node not pathologic by size criteria.  No ascites.  Subtle left-sided pericolonic nodule 5 mm. EUS/ERCP 11/11/2020-EGD showed gastritis which was biopsied, mucosal changes in the duodenum, mucosal changes in the duodenum sweep (gastric antral and oxyntic mucosa with no specific histopathologic changes; Warthin Starry stain negative for H pylori).  EUS showed an irregular mass in the pancreatic head measuring 26 mm x 30 mm; the outer margins were irregular.  There was sonographic evidence suggesting invasion into the portal vein (manifested by abutment) and the superior mesenteric vein (manifested by abutment); an intact interface was seen between the mass and the superior mesenteric artery and celiac trunk suggesting a lack of invasion.  Endosonographic imaging in the visualized portion of the liver showed no mass.  There was dilatation of the common bile duct and the common hepatic duct.  No malignant appearing lymph nodes were visualized in the celiac region, peripancreatic region, porta hepatis region.  FNA biopsy of the pancreas head mass showed malignant cells consistent with adenocarcinoma.  Biliary access could not be obtained.  Cholangiogram 11/12/2020-severe intrahepatic biliary dilatation.  A peripheral right hepatic bile duct was successfully cannulated.  Internal/external  biliary drain placed.  Cytology on biliary drain bile showed malignant cells consistent with adenocarcinoma.   CT chest 11/13/2020-no evidence of metastatic disease.   CT pelvis 11/13/2020-nonspecific circumferential wall thickening of the cecum measuring up to 12 mm in thickness.  Otherwise no evidence of intrapelvic metastases.   11/19/2020-biliary stent placement, biliary drain exchange 11/28/2020-exchange of  internal/external biliary drain secondary to persistent jaundice CT abdomen/pelvis 11/28/2020-pancreas head mass, 3.5 x 2.8 cm, no encasement of the celiac trunk or SMA, partial encasement of the portal venous confluence-at least 180 degrees, partial encasement of the SMV-approximately 180 degrees, ventral extension of tumor abuts the posterior duodenum, no adenopathy, percutaneous biliary stent with decompression of previous dilated intrahepatic and common bile ducts Cycle 1 FOLFIRINOX 01/01/2021, irinotecan held Cycle 2 FOLFIRINOX 01/13/2021, irinotecan held Cycle 3 FOLFIRINOX 01/28/2021   Painless jaundice-internal/external biliary drain placed 11/12/2020.  Biliary stent placed and drain exchange 11/19/2020, exchange of internal/external drain 11/28/2020 Weight loss Diabetes diagnosed June 2022 Change in bowel habits February 2022 Colonoscopy 08/06/2020-8 mm polyp in the sigmoid colon, a few small and large mouth diverticula in the sigmoid colon, normal mucosa found in the entire colon with biopsies for histology taken with a cold forceps from the right colon and left colon for evaluation of microscopic colitis (right colon-colonic mucosa with no significant pathologic findings; left colon-colonic mucosa with no significant pathologic findings; sigmoid polyp-tubular adenoma, negative for high-grade dysplasia). Hypertension Admission 12/12/2020 with acute cholecystitis, cholecystostomy tube 12/17/2020       Disposition: Francisco Frank appears stable.  He tolerated the last cycle of chemotherapy well.  He will complete cycle 3 today.  Irinotecan will be added to the chemotherapy regimen.  We reviewed potential toxicities associated with irinotecan including the chance of acute and delayed diarrhea.  He will use Imodium as needed.  He will contact us if he has diarrhea that is not relieved with Imodium.  He will return for an office visit in the next cycle of chemotherapy on 02/09/2021.  He will continue  follow-up with interventional radiology for management of the biliary drain.  Betsy Coder, MD  01/28/2021  8:32 AM

## 2021-01-28 NOTE — Progress Notes (Signed)
Patient presents for treatment. RN assessment completed along with the following:  Labs/vitals reviewed - Yes, and within treatment parameters.   Weight within 10% of previous measurement - Yes Oncology Treatment Attestation completed for current therapy- Yes, on date 12/10/20 Informed consent completed and reflects current therapy/intent - Yes, on date 01/01/21             Provider progress note reviewed - Yes, today's provider note was reviewed. Treatment/Antibody/Supportive plan reviewed - Yes, and there are no adjustments needed for today's treatment. S&H and other orders reviewed - Yes, and there are no additional orders identified. Previous treatment date reviewed - Yes, and the appropriate amount of time has elapsed between treatments. Clinic Hand Off Received from - Lenox Ponds, LPN  Patient to proceed with treatment.

## 2021-01-28 NOTE — Patient Instructions (Signed)
[facility]  The chemotherapy medication bag should finish at 46 hours, 96 hours, or 7 days. For example, if your pump is scheduled for 46 hours and it was put on at 4:00 p.m., it should finish at 2:00 p.m. the day it is scheduled to come off regardless of your appointment time.     Estimated time to finish at Friday, November 11 at 12:30   If the display on your pump reads "Low Volume" and it is beeping, take the batteries out of the pump and come to the cancer center for it to be taken off.   If the pump alarms go off prior to the pump reading "Low Volume" then call [phone] and someone can assist you.  If the plunger comes out and the chemotherapy medication is leaking out, please use your home chemo spill kit to clean up the spill. Do NOT use paper towels or other household products.  If you have problems or questions regarding your pump, please call either [phone] (24 hours a day) or the cancer center Monday-Friday 8:00 a.m.- 4:30 p.m. at the clinic number and we will assist you. If you are unable to get assistance, then go to the nearest Emergency Department and ask the staff to contact the IV team for assistance.     Discharge Instructions: Thank you for choosing [facility] to provide your oncology and hematology care.   If you have a lab appointment with the [facility], please go directly to the [facility] and check in at the registration area.   Wear comfortable clothing and clothing appropriate for easy access to any Portacath or PICC line.   We strive to give you quality time with your provider. You may need to reschedule your appointment if you arrive late (15 or more minutes).  Arriving late affects you and other patients whose appointments are after yours.  Also, if you miss three or more appointments without notifying the office, you may be dismissed from the clinic at the provider's discretion.      For prescription  refill requests, have your pharmacy contact our office and allow 72 hours for refills to be completed.    Today you received the following chemotherapy and/or immunotherapy agents oxaliplatin, irinotecan, leucovorin, fluorouracil      To help prevent nausea and vomiting after your treatment, we encourage you to take your nausea medication as directed.  BELOW ARE SYMPTOMS THAT SHOULD BE REPORTED IMMEDIATELY: *FEVER GREATER THAN 100.4 F (38 C) OR HIGHER *CHILLS OR SWEATING *NAUSEA AND VOMITING THAT IS NOT CONTROLLED WITH YOUR NAUSEA MEDICATION *UNUSUAL SHORTNESS OF BREATH *UNUSUAL BRUISING OR BLEEDING *URINARY PROBLEMS (pain or burning when urinating, or frequent urination) *BOWEL PROBLEMS (unusual diarrhea, constipation, pain near the anus) TENDERNESS IN MOUTH AND THROAT WITH OR WITHOUT PRESENCE OF ULCERS (sore throat, sores in mouth, or a toothache) UNUSUAL RASH, SWELLING OR PAIN  UNUSUAL VAGINAL DISCHARGE OR ITCHING   Items with * indicate a potential emergency and should be followed up as soon as possible or go to the Emergency Department if any problems should occur.  Please show the CHEMOTHERAPY ALERT CARD or IMMUNOTHERAPY ALERT CARD at check-in to the Emergency Department and triage nurse.  Should you have questions after your visit or need to cancel or reschedule your appointment, please contact [facility]  Dept: [phone]  and follow the prompts.  Office hours are 8:00 a.m. to 4:30 p.m. Monday - Friday. Please  note that voicemails left after 4:00 p.m. may not be returned until the following business day.  We are closed weekends and major holidays. You have access to a nurse at all times for urgent questions. Please call the main number to the clinic Dept: [phone] and follow the prompts.   For any non-urgent questions, you may also contact your provider using MyChart. We now offer e-Visits for anyone 28 and older to request care online for  non-urgent symptoms. For details visit [email or web address].   Also download the MyChart app! Go to the app store, search "MyChart", open the app, select [facility], and log in with your MyChart username and password.  Due to Covid, a mask is required upon entering the hospital/clinic. If you do not have a mask, one will be given to you upon arrival. For doctor visits, patients may have 1 support person aged 19 or older with them. For treatment visits, patients cannot have anyone with them due to current Covid guidelines and our immunocompromised population.   Oxaliplatin Injection What is this medication? OXALIPLATIN (ox AL i PLA tin) is a chemotherapy drug. It targets fast dividing cells, like cancer cells, and causes these cells to die. This medicine is used to treat cancers of the colon and rectum, and many other cancers. This medicine may be used for other purposes; ask your health care provider or pharmacist if you have questions. COMMON BRAND NAME(S): Eloxatin What should I tell my care team before I take this medication? They need to know if you have any of these conditions: heart disease history of irregular heartbeat liver disease low blood counts, like white cells, platelets, or red blood cells lung or breathing disease, like asthma take medicines that treat or prevent blood clots tingling of the fingers or toes, or other nerve disorder an unusual or allergic reaction to oxaliplatin, other chemotherapy, other medicines, foods, dyes, or preservatives pregnant or trying to get pregnant breast-feeding How should I use this medication? This drug is given as an infusion into a vein. It is administered in a hospital or clinic by a specially trained health care professional. Talk to your pediatrician regarding the use of this medicine in children. Special care may be needed. Overdosage: If you think you have taken too much of this medicine contact a poison control center or emergency  room at once. NOTE: This medicine is only for you. Do not share this medicine with others. What if I miss a dose? It is important not to miss a dose. Call your doctor or health care professional if you are unable to keep an appointment. What may interact with this medication? Do not take this medicine with any of the following medications: cisapride dronedarone pimozide thioridazine This medicine may also interact with the following medications: aspirin and aspirin-like medicines certain medicines that treat or prevent blood clots like warfarin, apixaban, dabigatran, and rivaroxaban cisplatin cyclosporine diuretics medicines for infection like acyclovir, adefovir, amphotericin B, bacitracin, cidofovir, foscarnet, ganciclovir, gentamicin, pentamidine, vancomycin NSAIDs, medicines for pain and inflammation, like ibuprofen or naproxen other medicines that prolong the QT interval (an abnormal heart rhythm) pamidronate zoledronic acid This list may not describe all possible interactions. Give your health care provider a list of all the medicines, herbs, non-prescription drugs, or dietary supplements you use. Also tell them if you smoke, drink alcohol, or use illegal drugs. Some items may interact with your medicine. What should I watch for while using this medication? Your condition will be monitored carefully while you  are receiving this medicine. You may need blood work done while you are taking this medicine. This medicine may make you feel generally unwell. This is not uncommon as chemotherapy can affect healthy cells as well as cancer cells. Report any side effects. Continue your course of treatment even though you feel ill unless your healthcare professional tells you to stop. This medicine can make you more sensitive to cold. Do not drink cold drinks or use ice. Cover exposed skin before coming in contact with cold temperatures or cold objects. When out in cold weather wear warm clothing  and cover your mouth and nose to warm the air that goes into your lungs. Tell your doctor if you get sensitive to the cold. Do not become pregnant while taking this medicine or for 9 months after stopping it. Women should inform their health care professional if they wish to become pregnant or think they might be pregnant. Men should not father a child while taking this medicine and for 6 months after stopping it. There is potential for serious side effects to an unborn child. Talk to your health care professional for more information. Do not breast-feed a child while taking this medicine or for 3 months after stopping it. This medicine has caused ovarian failure in some women. This medicine may make it more difficult to get pregnant. Talk to your health care professional if you are concerned about your fertility. This medicine has caused decreased sperm counts in some men. This may make it more difficult to father a child. Talk to your health care professional if you are concerned about your fertility. This medicine may increase your risk of getting an infection. Call your health care professional for advice if you get a fever, chills, or sore throat, or other symptoms of a cold or flu. Do not treat yourself. Try to avoid being around people who are sick. Avoid taking medicines that contain aspirin, acetaminophen, ibuprofen, naproxen, or ketoprofen unless instructed by your health care professional. These medicines may hide a fever. Be careful brushing or flossing your teeth or using a toothpick because you may get an infection or bleed more easily. If you have any dental work done, tell your dentist you are receiving this medicine. What side effects may I notice from receiving this medication? Side effects that you should report to your doctor or health care professional as soon as possible: allergic reactions like skin rash, itching or hives, swelling of the face, lips, or tongue breathing  problems cough low blood counts - this medicine may decrease the number of white blood cells, red blood cells, and platelets. You may be at increased risk for infections and bleeding nausea, vomiting pain, redness, or irritation at site where injected pain, tingling, numbness in the hands or feet signs and symptoms of bleeding such as bloody or black, tarry stools; red or dark brown urine; spitting up blood or brown material that looks like coffee grounds; red spots on the skin; unusual bruising or bleeding from the eyes, gums, or nose signs and symptoms of a dangerous change in heartbeat or heart rhythm like chest pain; dizziness; fast, irregular heartbeat; palpitations; feeling faint or lightheaded; falls signs and symptoms of infection like fever; chills; cough; sore throat; pain or trouble passing urine signs and symptoms of liver injury like dark yellow or brown urine; general ill feeling or flu-like symptoms; light-colored stools; loss of appetite; nausea; right upper belly pain; unusually weak or tired; yellowing of the eyes or skin signs  and symptoms of low red blood cells or anemia such as unusually weak or tired; feeling faint or lightheaded; falls signs and symptoms of muscle injury like dark urine; trouble passing urine or change in the amount of urine; unusually weak or tired; muscle pain; back pain Side effects that usually do not require medical attention (report to your doctor or health care professional if they continue or are bothersome): changes in taste diarrhea gas hair loss loss of appetite mouth sores This list may not describe all possible side effects. Call your doctor for medical advice about side effects. You may report side effects to FDA at 1-800-FDA-1088. Where should I keep my medication? This drug is given in a hospital or clinic and will not be stored at home. NOTE: This sheet is a summary. It may not cover all possible information. If you have questions about  this medicine, talk to your doctor, pharmacist, or health care provider.  2022 Elsevier/Gold Standard (2020-11-25 00:00:00)  Irinotecan injection What is this medication? IRINOTECAN (ir in oh TEE kan ) is a chemotherapy drug. It is used to treat colon and rectal cancer. This medicine may be used for other purposes; ask your health care provider or pharmacist if you have questions. COMMON BRAND NAME(S): Camptosar What should I tell my care team before I take this medication? They need to know if you have any of these conditions: dehydration diarrhea infection (especially a virus infection such as chickenpox, cold sores, or herpes) liver disease low blood counts, like low white cell, platelet, or red cell counts low levels of calcium, magnesium, or potassium in the blood recent or ongoing radiation therapy an unusual or allergic reaction to irinotecan, other medicines, foods, dyes, or preservatives pregnant or trying to get pregnant breast-feeding How should I use this medication? This drug is given as an infusion into a vein. It is administered in a hospital or clinic by a specially trained health care professional. Talk to your pediatrician regarding the use of this medicine in children. Special care may be needed. Overdosage: If you think you have taken too much of this medicine contact a poison control center or emergency room at once. NOTE: This medicine is only for you. Do not share this medicine with others. What if I miss a dose? It is important not to miss your dose. Call your doctor or health care professional if you are unable to keep an appointment. What may interact with this medication? Do not take this medicine with any of the following medications: cobicistat itraconazole This medicine may interact with the following medications: antiviral medicines for HIV or AIDS certain antibiotics like rifampin or rifabutin certain medicines for fungal infections like  ketoconazole, posaconazole, and voriconazole certain medicines for seizures like carbamazepine, phenobarbital, phenotoin clarithromycin gemfibrozil nefazodone St. John's Wort This list may not describe all possible interactions. Give your health care provider a list of all the medicines, herbs, non-prescription drugs, or dietary supplements you use. Also tell them if you smoke, drink alcohol, or use illegal drugs. Some items may interact with your medicine. What should I watch for while using this medication? Your condition will be monitored carefully while you are receiving this medicine. You will need important blood work done while you are taking this medicine. This drug may make you feel generally unwell. This is not uncommon, as chemotherapy can affect healthy cells as well as cancer cells. Report any side effects. Continue your course of treatment even though you feel ill unless your doctor  tells you to stop. In some cases, you may be given additional medicines to help with side effects. Follow all directions for their use. You may get drowsy or dizzy. Do not drive, use machinery, or do anything that needs mental alertness until you know how this medicine affects you. Do not stand or sit up quickly, especially if you are an older patient. This reduces the risk of dizzy or fainting spells. Call your health care professional for advice if you get a fever, chills, or sore throat, or other symptoms of a cold or flu. Do not treat yourself. This medicine decreases your body's ability to fight infections. Try to avoid being around people who are sick. Avoid taking products that contain aspirin, acetaminophen, ibuprofen, naproxen, or ketoprofen unless instructed by your doctor. These medicines may hide a fever. This medicine may increase your risk to bruise or bleed. Call your doctor or health care professional if you notice any unusual bleeding. Be careful brushing and flossing your teeth or using a  toothpick because you may get an infection or bleed more easily. If you have any dental work done, tell your dentist you are receiving this medicine. Do not become pregnant while taking this medicine or for 6 months after stopping it. Women should inform their health care professional if they wish to become pregnant or think they might be pregnant. Men should not father a child while taking this medicine and for 3 months after stopping it. There is potential for serious side effects to an unborn child. Talk to your health care professional for more information. Do not breast-feed an infant while taking this medicine or for 7 days after stopping it. This medicine has caused ovarian failure in some women. This medicine may make it more difficult to get pregnant. Talk to your health care professional if you are concerned about your fertility. This medicine has caused decreased sperm counts in some men. This may make it more difficult to father a child. Talk to your health care professional if you are concerned about your fertility. What side effects may I notice from receiving this medication? Side effects that you should report to your doctor or health care professional as soon as possible: allergic reactions like skin rash, itching or hives, swelling of the face, lips, or tongue chest pain diarrhea flushing, runny nose, sweating during infusion low blood counts - this medicine may decrease the number of white blood cells, red blood cells and platelets. You may be at increased risk for infections and bleeding. nausea, vomiting pain, swelling, warmth in the leg signs of decreased platelets or bleeding - bruising, pinpoint red spots on the skin, black, tarry stools, blood in the urine signs of infection - fever or chills, cough, sore throat, pain or difficulty passing urine signs of decreased red blood cells - unusually weak or tired, fainting spells, lightheadedness Side effects that usually do not  require medical attention (report to your doctor or health care professional if they continue or are bothersome): constipation hair loss headache loss of appetite mouth sores stomach pain This list may not describe all possible side effects. Call your doctor for medical advice about side effects. You may report side effects to FDA at 1-800-FDA-1088. Where should I keep my medication? This drug is given in a hospital or clinic and will not be stored at home. NOTE: This sheet is a summary. It may not cover all possible information. If you have questions about this medicine, talk to your doctor, pharmacist,  or health care provider.  2022 Elsevier/Gold Standard (2020-11-25 00:00:00)  Leucovorin injection What is this medication? LEUCOVORIN (loo koe VOR in) is used to prevent or treat the harmful effects of some medicines. This medicine is used to treat anemia caused by a low amount of folic acid in the body. It is also used with 5-fluorouracil (5-FU) to treat colon cancer. This medicine may be used for other purposes; ask your health care provider or pharmacist if you have questions. What should I tell my care team before I take this medication? They need to know if you have any of these conditions: anemia from low levels of vitamin B-12 in the blood an unusual or allergic reaction to leucovorin, folic acid, other medicines, foods, dyes, or preservatives pregnant or trying to get pregnant breast-feeding How should I use this medication? This medicine is for injection into a muscle or into a vein. It is given by a health care professional in a hospital or clinic setting. Talk to your pediatrician regarding the use of this medicine in children. Special care may be needed. Overdosage: If you think you have taken too much of this medicine contact a poison control center or emergency room at once. NOTE: This medicine is only for you. Do not share this medicine with others. What if I miss a  dose? This does not apply. What may interact with this medication? capecitabine fluorouracil phenobarbital phenytoin primidone trimethoprim-sulfamethoxazole This list may not describe all possible interactions. Give your health care provider a list of all the medicines, herbs, non-prescription drugs, or dietary supplements you use. Also tell them if you smoke, drink alcohol, or use illegal drugs. Some items may interact with your medicine. What should I watch for while using this medication? Your condition will be monitored carefully while you are receiving this medicine. This medicine may increase the side effects of 5-fluorouracil, 5-FU. Tell your doctor or health care professional if you have diarrhea or mouth sores that do not get better or that get worse. What side effects may I notice from receiving this medication? Side effects that you should report to your doctor or health care professional as soon as possible: allergic reactions like skin rash, itching or hives, swelling of the face, lips, or tongue breathing problems fever, infection mouth sores unusual bleeding or bruising unusually weak or tired Side effects that usually do not require medical attention (report to your doctor or health care professional if they continue or are bothersome): constipation or diarrhea loss of appetite nausea, vomiting This list may not describe all possible side effects. Call your doctor for medical advice about side effects. You may report side effects to FDA at 1-800-FDA-1088. Where should I keep my medication? This drug is given in a hospital or clinic and will not be stored at home. NOTE: This sheet is a summary. It may not cover all possible information. If you have questions about this medicine, talk to your doctor, pharmacist, or health care provider.  2022 Elsevier/Gold Standard (2007-09-14 00:00:00)  Fluorouracil, 5-FU injection What is this medication? FLUOROURACIL, 5-FU (flure oh  YOOR a sil) is a chemotherapy drug. It slows the growth of cancer cells. This medicine is used to treat many types of cancer like breast cancer, colon or rectal cancer, pancreatic cancer, and stomach cancer. This medicine may be used for other purposes; ask your health care provider or pharmacist if you have questions. COMMON BRAND NAME(S): Adrucil What should I tell my care team before I take this medication?  They need to know if you have any of these conditions: blood disorders dihydropyrimidine dehydrogenase (DPD) deficiency infection (especially a virus infection such as chickenpox, cold sores, or herpes) kidney disease liver disease malnourished, poor nutrition recent or ongoing radiation therapy an unusual or allergic reaction to fluorouracil, other chemotherapy, other medicines, foods, dyes, or preservatives pregnant or trying to get pregnant breast-feeding How should I use this medication? This drug is given as an infusion or injection into a vein. It is administered in a hospital or clinic by a specially trained health care professional. Talk to your pediatrician regarding the use of this medicine in children. Special care may be needed. Overdosage: If you think you have taken too much of this medicine contact a poison control center or emergency room at once. NOTE: This medicine is only for you. Do not share this medicine with others. What if I miss a dose? It is important not to miss your dose. Call your doctor or health care professional if you are unable to keep an appointment. What may interact with this medication? Do not take this medicine with any of the following medications: live virus vaccines This medicine may also interact with the following medications: medicines that treat or prevent blood clots like warfarin, enoxaparin, and dalteparin This list may not describe all possible interactions. Give your health care provider a list of all the medicines, herbs,  non-prescription drugs, or dietary supplements you use. Also tell them if you smoke, drink alcohol, or use illegal drugs. Some items may interact with your medicine. What should I watch for while using this medication? Visit your doctor for checks on your progress. This drug may make you feel generally unwell. This is not uncommon, as chemotherapy can affect healthy cells as well as cancer cells. Report any side effects. Continue your course of treatment even though you feel ill unless your doctor tells you to stop. In some cases, you may be given additional medicines to help with side effects. Follow all directions for their use. Call your doctor or health care professional for advice if you get a fever, chills or sore throat, or other symptoms of a cold or flu. Do not treat yourself. This drug decreases your body's ability to fight infections. Try to avoid being around people who are sick. This medicine may increase your risk to bruise or bleed. Call your doctor or health care professional if you notice any unusual bleeding. Be careful brushing and flossing your teeth or using a toothpick because you may get an infection or bleed more easily. If you have any dental work done, tell your dentist you are receiving this medicine. Avoid taking products that contain aspirin, acetaminophen, ibuprofen, naproxen, or ketoprofen unless instructed by your doctor. These medicines may hide a fever. Do not become pregnant while taking this medicine. Women should inform their doctor if they wish to become pregnant or think they might be pregnant. There is a potential for serious side effects to an unborn child. Talk to your health care professional or pharmacist for more information. Do not breast-feed an infant while taking this medicine. Men should inform their doctor if they wish to father a child. This medicine may lower sperm counts. Do not treat diarrhea with over the counter products. Contact your doctor if you  have diarrhea that lasts more than 2 days or if it is severe and watery. This medicine can make you more sensitive to the sun. Keep out of the sun. If you cannot avoid  being in the sun, wear protective clothing and use sunscreen. Do not use sun lamps or tanning beds/booths. What side effects may I notice from receiving this medication? Side effects that you should report to your doctor or health care professional as soon as possible: allergic reactions like skin rash, itching or hives, swelling of the face, lips, or tongue low blood counts - this medicine may decrease the number of white blood cells, red blood cells and platelets. You may be at increased risk for infections and bleeding. signs of infection - fever or chills, cough, sore throat, pain or difficulty passing urine signs of decreased platelets or bleeding - bruising, pinpoint red spots on the skin, black, tarry stools, blood in the urine signs of decreased red blood cells - unusually weak or tired, fainting spells, lightheadedness breathing problems changes in vision chest pain mouth sores nausea and vomiting pain, swelling, redness at site where injected pain, tingling, numbness in the hands or feet redness, swelling, or sores on hands or feet stomach pain unusual bleeding Side effects that usually do not require medical attention (report to your doctor or health care professional if they continue or are bothersome): changes in finger or toe nails diarrhea dry or itchy skin hair loss headache loss of appetite sensitivity of eyes to the light stomach upset unusually teary eyes This list may not describe all possible side effects. Call your doctor for medical advice about side effects. You may report side effects to FDA at 1-800-FDA-1088. Where should I keep my medication? This drug is given in a hospital or clinic and will not be stored at home. NOTE: This sheet is a summary. It may not cover all possible information. If  you have questions about this medicine, talk to your doctor, pharmacist, or health care provider.  2022 Elsevier/Gold Standard (2020-11-25 00:00:00)

## 2021-01-28 NOTE — Progress Notes (Signed)
Nutrition follow up completed with patient during infusion for Pancreas cancer. Patient still has biliary drain. Adding Irinotecan to infusion today.  Weight: 149.2 pounds Nov 9, improved from 146.4 pounds Oct 25.  Labs include glucose 239 and alb 3.4.  Patient denies nausea, vomiting, and constipation. Reports loose stools up to 3 times daily. He has a good appetite and is eating 4 times daily. He is aware of the increased chance for diarrhea with the addition of Irinotecan. Reports he has not been drinking ensure because of increased cold sensations after oxaliplatin infusions.  Nutrition Diagnosis: Unintended wt loss stable.  Intervention: Educated on low fiber diet to improve diarrhea and provided a nutrition fact sheet. Educated on warm nutrition supplements/high calorie drinks and snacks. Encouraged continued wt maintenance. Questions answered and teach back method used.  Monitoring, Evaluation, Goals: Tolerate adequate calories and protein to minimize weight loss.  Next Visit: Patient will call with questions. He has contact information.

## 2021-01-28 NOTE — Telephone Encounter (Signed)
Patient seen by Dr. Sherrill today ? ?Vitals are within treatment parameters. ? ?Labs reviewed by Dr. Sherrill and are within treatment parameters. ? ?Per physician team, patient is ready for treatment and there are NO modifications to the treatment plan.  ?

## 2021-01-29 ENCOUNTER — Telehealth: Payer: Self-pay | Admitting: Emergency Medicine

## 2021-01-29 ENCOUNTER — Other Ambulatory Visit (HOSPITAL_COMMUNITY): Payer: Medicare Other

## 2021-01-29 NOTE — Telephone Encounter (Signed)
24 Hour Callback 24 Hour callback for 1st time Irinotecan. Patent reports no side effects. Just cold sensitivity due to the Oxaliplatin. Pt advised to call the office with any questions or concerns

## 2021-01-30 ENCOUNTER — Inpatient Hospital Stay: Payer: Medicare Other

## 2021-01-30 ENCOUNTER — Other Ambulatory Visit: Payer: Self-pay

## 2021-01-30 VITALS — BP 110/71 | HR 55 | Temp 98.2°F | Resp 20

## 2021-01-30 DIAGNOSIS — C250 Malignant neoplasm of head of pancreas: Secondary | ICD-10-CM

## 2021-01-30 DIAGNOSIS — Z5111 Encounter for antineoplastic chemotherapy: Secondary | ICD-10-CM | POA: Diagnosis not present

## 2021-01-30 MED ORDER — SODIUM CHLORIDE 0.9% FLUSH
10.0000 mL | INTRAVENOUS | Status: DC | PRN
  Administered 2021-01-30: 10 mL

## 2021-01-30 MED ORDER — HEPARIN SOD (PORK) LOCK FLUSH 100 UNIT/ML IV SOLN
500.0000 [IU] | Freq: Once | INTRAVENOUS | Status: AC | PRN
  Administered 2021-01-30: 500 [IU]

## 2021-01-30 NOTE — Patient Instructions (Signed)
[facility]  Discharge Instructions: Thank you for choosing [facility] to provide your oncology and hematology care.   If you have a lab appointment with the [facility], please go directly to the [facility] and check in at the registration area.   Wear comfortable clothing and clothing appropriate for easy access to any Portacath or PICC line.   We strive to give you quality time with your provider. You may need to reschedule your appointment if you arrive late (15 or more minutes).  Arriving late affects you and other patients whose appointments are after yours.  Also, if you miss three or more appointments without notifying the office, you may be dismissed from the clinic at the provider's discretion.      For prescription refill requests, have your pharmacy contact our office and allow 72 hours for refills to be completed.    Today you received the following chemotherapy and/or immunotherapy agents: 69fu  Fluorouracil, 5-FU injection What is this medication? FLUOROURACIL, 5-FU (flure oh YOOR a sil) is a chemotherapy drug. It slows the growth of cancer cells. This medicine is used to treat many types of cancer like breast cancer, colon or rectal cancer, pancreatic cancer, and stomach cancer. This medicine may be used for other purposes; ask your health care provider or pharmacist if you have questions. COMMON BRAND NAME(S): Adrucil What should I tell my care team before I take this medication? They need to know if you have any of these conditions: blood disorders dihydropyrimidine dehydrogenase (DPD) deficiency infection (especially a virus infection such as chickenpox, cold sores, or herpes) kidney disease liver disease malnourished, poor nutrition recent or ongoing radiation therapy an unusual or allergic reaction to fluorouracil, other chemotherapy, other medicines, foods, dyes, or preservatives pregnant or trying to get  pregnant breast-feeding How should I use this medication? This drug is given as an infusion or injection into a vein. It is administered in a hospital or clinic by a specially trained health care professional. Talk to your pediatrician regarding the use of this medicine in children. Special care may be needed. Overdosage: If you think you have taken too much of this medicine contact a poison control center or emergency room at once. NOTE: This medicine is only for you. Do not share this medicine with others. What if I miss a dose? It is important not to miss your dose. Call your doctor or health care professional if you are unable to keep an appointment. What may interact with this medication? Do not take this medicine with any of the following medications: live virus vaccines This medicine may also interact with the following medications: medicines that treat or prevent blood clots like warfarin, enoxaparin, and dalteparin This list may not describe all possible interactions. Give your health care provider a list of all the medicines, herbs, non-prescription drugs, or dietary supplements you use. Also tell them if you smoke, drink alcohol, or use illegal drugs. Some items may interact with your medicine. What should I watch for while using this medication? Visit your doctor for checks on your progress. This drug may make you feel generally unwell. This is not uncommon, as chemotherapy can affect healthy cells as well as cancer cells. Report any side effects. Continue your course of treatment even though you feel ill unless your doctor tells you to stop. In some cases, you may be given additional medicines to help with side effects. Follow all directions for their use. Call your  doctor or health care professional for advice if you get a fever, chills or sore throat, or other symptoms of a cold or flu. Do not treat yourself. This drug decreases your body's ability to fight infections. Try to avoid  being around people who are sick. This medicine may increase your risk to bruise or bleed. Call your doctor or health care professional if you notice any unusual bleeding. Be careful brushing and flossing your teeth or using a toothpick because you may get an infection or bleed more easily. If you have any dental work done, tell your dentist you are receiving this medicine. Avoid taking products that contain aspirin, acetaminophen, ibuprofen, naproxen, or ketoprofen unless instructed by your doctor. These medicines may hide a fever. Do not become pregnant while taking this medicine. Women should inform their doctor if they wish to become pregnant or think they might be pregnant. There is a potential for serious side effects to an unborn child. Talk to your health care professional or pharmacist for more information. Do not breast-feed an infant while taking this medicine. Men should inform their doctor if they wish to father a child. This medicine may lower sperm counts. Do not treat diarrhea with over the counter products. Contact your doctor if you have diarrhea that lasts more than 2 days or if it is severe and watery. This medicine can make you more sensitive to the sun. Keep out of the sun. If you cannot avoid being in the sun, wear protective clothing and use sunscreen. Do not use sun lamps or tanning beds/booths. What side effects may I notice from receiving this medication? Side effects that you should report to your doctor or health care professional as soon as possible: allergic reactions like skin rash, itching or hives, swelling of the face, lips, or tongue low blood counts - this medicine may decrease the number of white blood cells, red blood cells and platelets. You may be at increased risk for infections and bleeding. signs of infection - fever or chills, cough, sore throat, pain or difficulty passing urine signs of decreased platelets or bleeding - bruising, pinpoint red spots on the  skin, black, tarry stools, blood in the urine signs of decreased red blood cells - unusually weak or tired, fainting spells, lightheadedness breathing problems changes in vision chest pain mouth sores nausea and vomiting pain, swelling, redness at site where injected pain, tingling, numbness in the hands or feet redness, swelling, or sores on hands or feet stomach pain unusual bleeding Side effects that usually do not require medical attention (report to your doctor or health care professional if they continue or are bothersome): changes in finger or toe nails diarrhea dry or itchy skin hair loss headache loss of appetite sensitivity of eyes to the light stomach upset unusually teary eyes This list may not describe all possible side effects. Call your doctor for medical advice about side effects. You may report side effects to FDA at 1-800-FDA-1088. Where should I keep my medication? This drug is given in a hospital or clinic and will not be stored at home. NOTE: This sheet is a summary. It may not cover all possible information. If you have questions about this medicine, talk to your doctor, pharmacist, or health care provider.  2022 Elsevier/Gold Standard (2020-11-25 00:00:00)      To help prevent nausea and vomiting after your treatment, we encourage you to take your nausea medication as directed.  BELOW ARE SYMPTOMS THAT SHOULD BE REPORTED IMMEDIATELY: *FEVER GREATER  THAN 100.4 F (38 C) OR HIGHER *CHILLS OR SWEATING *NAUSEA AND VOMITING THAT IS NOT CONTROLLED WITH YOUR NAUSEA MEDICATION *UNUSUAL SHORTNESS OF BREATH *UNUSUAL BRUISING OR BLEEDING *URINARY PROBLEMS (pain or burning when urinating, or frequent urination) *BOWEL PROBLEMS (unusual diarrhea, constipation, pain near the anus) TENDERNESS IN MOUTH AND THROAT WITH OR WITHOUT PRESENCE OF ULCERS (sore throat, sores in mouth, or a toothache) UNUSUAL RASH, SWELLING OR PAIN  UNUSUAL VAGINAL DISCHARGE OR ITCHING    Items with * indicate a potential emergency and should be followed up as soon as possible or go to the Emergency Department if any problems should occur.  Please show the CHEMOTHERAPY ALERT CARD or IMMUNOTHERAPY ALERT CARD at check-in to the Emergency Department and triage nurse.  Should you have questions after your visit or need to cancel or reschedule your appointment, please contact [facility]  Dept: [phone]  and follow the prompts.  Office hours are 8:00 a.m. to 4:30 p.m. Monday - Friday. Please note that voicemails left after 4:00 p.m. may not be returned until the following business day.  We are closed weekends and major holidays. You have access to a nurse at all times for urgent questions. Please call the main number to the clinic Dept: [phone] and follow the prompts.   For any non-urgent questions, you may also contact your provider using MyChart. We now offer e-Visits for anyone 57 and older to request care online for non-urgent symptoms. For details visit [email or web address].   Also download the MyChart app! Go to the app store, search "MyChart", open the app, select [facility], and log in with your MyChart username and password.  Due to Covid, a mask is required upon entering the hospital/clinic. If you do not have a mask, one will be given to you upon arrival. For doctor visits, patients may have 1 support person aged 45 or older with them. For treatment visits, patients cannot have anyone with them due to current Covid guidelines and our immunocompromised population.

## 2021-02-08 ENCOUNTER — Other Ambulatory Visit: Payer: Self-pay | Admitting: Oncology

## 2021-02-09 ENCOUNTER — Inpatient Hospital Stay: Payer: Medicare Other

## 2021-02-09 ENCOUNTER — Inpatient Hospital Stay (HOSPITAL_BASED_OUTPATIENT_CLINIC_OR_DEPARTMENT_OTHER): Payer: Medicare Other | Admitting: Oncology

## 2021-02-09 ENCOUNTER — Telehealth: Payer: Self-pay

## 2021-02-09 ENCOUNTER — Other Ambulatory Visit: Payer: Self-pay

## 2021-02-09 VITALS — BP 143/80 | HR 59 | Temp 97.7°F | Resp 18

## 2021-02-09 VITALS — BP 127/84 | HR 68 | Temp 97.8°F | Resp 18 | Ht 68.0 in | Wt 146.6 lb

## 2021-02-09 DIAGNOSIS — C250 Malignant neoplasm of head of pancreas: Secondary | ICD-10-CM | POA: Diagnosis not present

## 2021-02-09 DIAGNOSIS — Z5111 Encounter for antineoplastic chemotherapy: Secondary | ICD-10-CM | POA: Diagnosis not present

## 2021-02-09 LAB — CMP (CANCER CENTER ONLY)
ALT: 31 U/L (ref 0–44)
AST: 43 U/L — ABNORMAL HIGH (ref 15–41)
Albumin: 3.8 g/dL (ref 3.5–5.0)
Alkaline Phosphatase: 121 U/L (ref 38–126)
Anion gap: 8 (ref 5–15)
BUN: 11 mg/dL (ref 8–23)
CO2: 24 mmol/L (ref 22–32)
Calcium: 8.9 mg/dL (ref 8.9–10.3)
Chloride: 109 mmol/L (ref 98–111)
Creatinine: 0.86 mg/dL (ref 0.61–1.24)
GFR, Estimated: >60 mL/min (ref >60)
Glucose, Bld: 296 mg/dL — ABNORMAL HIGH (ref 70–99)
Potassium: 3.5 mmol/L (ref 3.5–5.1)
Sodium: 141 mmol/L (ref 135–145)
Total Bilirubin: 0.4 mg/dL (ref 0.3–1.2)
Total Protein: 6.2 g/dL — ABNORMAL LOW (ref 6.5–8.1)

## 2021-02-09 LAB — CBC WITH DIFFERENTIAL (CANCER CENTER ONLY)
Abs Immature Granulocytes: 0.02 10*3/uL (ref 0.00–0.07)
Basophils Absolute: 0.1 10*3/uL (ref 0.0–0.1)
Basophils Relative: 1 %
Eosinophils Absolute: 0.4 10*3/uL (ref 0.0–0.5)
Eosinophils Relative: 6 %
HCT: 30.3 % — ABNORMAL LOW (ref 39.0–52.0)
Hemoglobin: 9.9 g/dL — ABNORMAL LOW (ref 13.0–17.0)
Immature Granulocytes: 0 %
Lymphocytes Relative: 30 %
Lymphs Abs: 2.1 10*3/uL (ref 0.7–4.0)
MCH: 30 pg (ref 26.0–34.0)
MCHC: 32.7 g/dL (ref 30.0–36.0)
MCV: 91.8 fL (ref 80.0–100.0)
Monocytes Absolute: 0.7 10*3/uL (ref 0.1–1.0)
Monocytes Relative: 10 %
Neutro Abs: 3.7 10*3/uL (ref 1.7–7.7)
Neutrophils Relative %: 53 %
Platelet Count: 176 10*3/uL (ref 150–400)
RBC: 3.3 MIL/uL — ABNORMAL LOW (ref 4.22–5.81)
RDW: 15.6 % — ABNORMAL HIGH (ref 11.5–15.5)
WBC Count: 7 10*3/uL (ref 4.0–10.5)
nRBC: 0 % (ref 0.0–0.2)

## 2021-02-09 MED ORDER — DEXTROSE 5 % IV SOLN: Freq: Once | INTRAVENOUS | Status: AC

## 2021-02-09 MED ORDER — SODIUM CHLORIDE 0.9% FLUSH
10.0000 mL | INTRAVENOUS | Status: DC | PRN
  Administered 2021-02-09: 10 mL

## 2021-02-09 MED ORDER — PALONOSETRON HCL INJECTION 0.25 MG/5ML
0.2500 mg | Freq: Once | INTRAVENOUS | Status: AC
  Administered 2021-02-09: 0.25 mg via INTRAVENOUS
  Filled 2021-02-09: qty 5

## 2021-02-09 MED ORDER — SODIUM CHLORIDE 0.9 % IV SOLN
10.0000 mg | Freq: Once | INTRAVENOUS | Status: AC
  Administered 2021-02-09: 10 mg via INTRAVENOUS
  Filled 2021-02-09: qty 1

## 2021-02-09 MED ORDER — SODIUM CHLORIDE 0.9 % IV SOLN
2400.0000 mg/m2 | INTRAVENOUS | Status: DC
  Administered 2021-02-09: 4250 mg via INTRAVENOUS
  Filled 2021-02-09: qty 85

## 2021-02-09 MED ORDER — SODIUM CHLORIDE 0.9 % IV SOLN
150.0000 mg/m2 | Freq: Once | INTRAVENOUS | Status: AC
  Administered 2021-02-09: 260 mg via INTRAVENOUS
  Filled 2021-02-09: qty 13

## 2021-02-09 MED ORDER — ATROPINE SULFATE 1 MG/ML IV SOLN
0.5000 mg | Freq: Once | INTRAVENOUS | Status: AC | PRN
  Administered 2021-02-09: 0.5 mg via INTRAVENOUS
  Filled 2021-02-09: qty 1

## 2021-02-09 MED ORDER — SODIUM CHLORIDE 0.9 % IV SOLN
400.0000 mg/m2 | Freq: Once | INTRAVENOUS | Status: AC
  Administered 2021-02-09: 708 mg via INTRAVENOUS
  Filled 2021-02-09: qty 35.4

## 2021-02-09 MED ORDER — OXALIPLATIN CHEMO INJECTION 100 MG/20ML
85.0000 mg/m2 | Freq: Once | INTRAVENOUS | Status: AC
  Administered 2021-02-09: 150 mg via INTRAVENOUS
  Filled 2021-02-09: qty 10

## 2021-02-09 MED ORDER — SODIUM CHLORIDE 0.9 % IV SOLN
150.0000 mg | Freq: Once | INTRAVENOUS | Status: AC
  Administered 2021-02-09: 150 mg via INTRAVENOUS
  Filled 2021-02-09: qty 5

## 2021-02-09 NOTE — Progress Notes (Signed)
[facility] OFFICE PROGRESS NOTE   Diagnosis: Pancreas cancer  INTERVAL HISTORY:   Francisco Frank completed another cycle FOLFIRINOX on 01/13/2021.  No nausea/vomiting, mouth sores, or diarrhea.  He reports a few days of malaise following chemotherapy.  He had cold sensitivity following chemotherapy.  No neuropathy symptoms at present.  He reports mild "cramping "in the abdomen.  Objective:  Vital signs in last 24 hours:  Blood pressure 127/84, pulse 68, temperature 97.8 F (36.6 C), resp. rate 18, height 5\' 8"  (1.727 m), weight 146 lb 9.6 oz (66.5 kg), SpO2 100 %.    HEENT: No thrush or ulcers Resp: Lungs clear bilaterally Cardio: Regular rate and rhythm GI: No hepatosplenomegaly, nontender, right upper quadrant biliary drain site with a gauze dressing Vascular: No leg edema  Portacath/PICC-without erythema  Lab Results:  Lab Results  Component Value Date   WBC 7.0 02/09/2021   HGB 9.9 (L) 02/09/2021   HCT 30.3 (L) 02/09/2021   MCV 91.8 02/09/2021   PLT 176 02/09/2021   NEUTROABS 3.7 02/09/2021    CMP  Lab Results  Component Value Date   NA 141 02/09/2021   K 3.5 02/09/2021   CL 109 02/09/2021   CO2 24 02/09/2021   GLUCOSE 296 (H) 02/09/2021   BUN 11 02/09/2021   CREATININE 0.86 02/09/2021   CALCIUM 8.9 02/09/2021   PROT 6.2 (L) 02/09/2021   ALBUMIN 3.8 02/09/2021   AST 43 (H) 02/09/2021   ALT 31 02/09/2021   ALKPHOS 121 02/09/2021   BILITOT 0.4 02/09/2021   GFRNONAA >60 02/09/2021    Lab Results  Component Value Date   BBC488 1,048 (H) 01/01/2021    Medications: I have reviewed the patient's current medications.   Assessment/Plan: Pancreas cancer Outside CT-apparent pancreas abnormality (we do not have a copy of the report) MRI abdomen 11/06/2020-pancreatic body and tail atrophy with duct dilatation up to 9 mm.  Both ducts undergo an abrupt transition in the region of the pancreatic head.  Non border deforming pancreatic head mass  measuring 3.2 x 2.6 cm.  No arterial involvement by tumor.  SMV adjacent to presumed tumor without encasement.  Portal vein uninvolved.  No abdominal adenopathy.  Left periaortic 7 mm node not pathologic by size criteria.  No ascites.  Subtle left-sided pericolonic nodule 5 mm. EUS/ERCP 11/11/2020-EGD showed gastritis which was biopsied, mucosal changes in the duodenum, mucosal changes in the duodenum sweep (gastric antral and oxyntic mucosa with no specific histopathologic changes; Warthin Starry stain negative for H pylori).  EUS showed an irregular mass in the pancreatic head measuring 26 mm x 30 mm; the outer margins were irregular.  There was sonographic evidence suggesting invasion into the portal vein (manifested by abutment) and the superior mesenteric vein (manifested by abutment); an intact interface was seen between the mass and the superior mesenteric artery and celiac trunk suggesting a lack of invasion.  Endosonographic imaging in the visualized portion of the liver showed no mass.  There was dilatation of the common bile duct and the common hepatic duct.  No malignant appearing lymph nodes were visualized in the celiac region, peripancreatic region, porta hepatis region.  FNA biopsy of the pancreas head mass showed malignant cells consistent with adenocarcinoma.  Biliary access could not be obtained.  Cholangiogram 11/12/2020-severe intrahepatic biliary dilatation.  A peripheral right hepatic bile duct was successfully cannulated.  Internal/external biliary drain placed.  Cytology on biliary drain bile showed malignant cells consistent with adenocarcinoma.   CT chest 11/13/2020-no  evidence of metastatic disease.   CT pelvis 11/13/2020-nonspecific circumferential wall thickening of the cecum measuring up to 12 mm in thickness.  Otherwise no evidence of intrapelvic metastases.   11/19/2020-biliary stent placement, biliary drain exchange 11/28/2020-exchange of internal/external biliary drain secondary to  persistent jaundice CT abdomen/pelvis 11/28/2020-pancreas head mass, 3.5 x 2.8 cm, no encasement of the celiac trunk or SMA, partial encasement of the portal venous confluence-at least 180 degrees, partial encasement of the SMV-approximately 180 degrees, ventral extension of tumor abuts the posterior duodenum, no adenopathy, percutaneous biliary stent with decompression of previous dilated intrahepatic and common bile ducts Cycle 1 FOLFIRINOX 01/01/2021, irinotecan held Cycle 2 FOLFIRINOX 01/13/2021, irinotecan held Cycle 3 FOLFIRINOX 01/28/2021 Cycle 4 FOLFIRINOX 02/09/2021   Painless jaundice-internal/external biliary drain placed 11/12/2020.  Biliary stent placed and drain exchange 11/19/2020, exchange of internal/external drain 11/28/2020 Weight loss Diabetes diagnosed June 2022 Change in bowel habits February 2022 Colonoscopy 08/06/2020-8 mm polyp in the sigmoid colon, a few small and large mouth diverticula in the sigmoid colon, normal mucosa found in the entire colon with biopsies for histology taken with a cold forceps from the right colon and left colon for evaluation of microscopic colitis (right colon-colonic mucosa with no significant pathologic findings; left colon-colonic mucosa with no significant pathologic findings; sigmoid polyp-tubular adenoma, negative for high-grade dysplasia). Hypertension Admission 12/12/2020 with acute cholecystitis, cholecystostomy tube 12/17/2020         Disposition: [facility] Frank appears stable.  Has completed 3 cycles of FOLFIRINOX.  He continues to tolerate chemotherapy well.  We will complete cycle 4 FOLFIRINOX today.  He plans to schedule a follow-up appointment with interventional radiology to consider removal of the biliary drain.  Francisco. Frank will return for an office visit and chemotherapy in 2 weeks.  We will follow-up on the CA 19-9 from today.  Betsy Coder, MD  02/09/2021  8:46 AM

## 2021-02-09 NOTE — Patient Instructions (Signed)

## 2021-02-09 NOTE — Telephone Encounter (Signed)
Patient seen by Dr. Sherrill today ? ?Vitals are within treatment parameters. ? ?Labs reviewed by Dr. Sherrill and are within treatment parameters. ? ?Per physician team, patient is ready for treatment and there are NO modifications to the treatment plan.  ?

## 2021-02-09 NOTE — Patient Instructions (Addendum)
[facility]  The chemotherapy medication bag should finish at 46 hours, 96 hours, or 7 days. For example, if your pump is scheduled for 46 hours and it was put on at 4:00 p.m., it should finish at 2:00 p.m. the day it is scheduled to come off regardless of your appointment time.     Estimated time to finish at Wednesday, November 23 at 12:00pm   If the display on your pump reads "Low Volume" and it is beeping, take the batteries out of the pump and come to the cancer center for it to be taken off.   If the pump alarms go off prior to the pump reading "Low Volume" then call [phone] and someone can assist you.  If the plunger comes out and the chemotherapy medication is leaking out, please use your home chemo spill kit to clean up the spill. Do NOT use paper towels or other household products.  If you have problems or questions regarding your pump, please call either [phone] (24 hours a day) or the cancer center Monday-Friday 8:00 a.m.- 4:30 p.m. at the clinic number and we will assist you. If you are unable to get assistance, then go to the nearest Emergency Department and ask the staff to contact the IV team for assistance.     Discharge Instructions: Thank you for choosing [facility] to provide your oncology and hematology care.   If you have a lab appointment with the [facility], please go directly to the [facility] and check in at the registration area.   Wear comfortable clothing and clothing appropriate for easy access to any Portacath or PICC line.   We strive to give you quality time with your provider. You may need to reschedule your appointment if you arrive late (15 or more minutes).  Arriving late affects you and other patients whose appointments are after yours.  Also, if you miss three or more appointments without notifying the office, you may be dismissed from the clinic at the provider's discretion.      For  prescription refill requests, have your pharmacy contact our office and allow 72 hours for refills to be completed.    Today you received the following chemotherapy and/or immunotherapy agents oxaliplatin, irinotecan, leucovorin, fluorouracil      To help prevent nausea and vomiting after your treatment, we encourage you to take your nausea medication as directed.  BELOW ARE SYMPTOMS THAT SHOULD BE REPORTED IMMEDIATELY: *FEVER GREATER THAN 100.4 F (38 C) OR HIGHER *CHILLS OR SWEATING *NAUSEA AND VOMITING THAT IS NOT CONTROLLED WITH YOUR NAUSEA MEDICATION *UNUSUAL SHORTNESS OF BREATH *UNUSUAL BRUISING OR BLEEDING *URINARY PROBLEMS (pain or burning when urinating, or frequent urination) *BOWEL PROBLEMS (unusual diarrhea, constipation, pain near the anus) TENDERNESS IN MOUTH AND THROAT WITH OR WITHOUT PRESENCE OF ULCERS (sore throat, sores in mouth, or a toothache) UNUSUAL RASH, SWELLING OR PAIN  UNUSUAL VAGINAL DISCHARGE OR ITCHING   Items with * indicate a potential emergency and should be followed up as soon as possible or go to the Emergency Department if any problems should occur.  Please show the CHEMOTHERAPY ALERT CARD or IMMUNOTHERAPY ALERT CARD at check-in to the Emergency Department and triage nurse.  Should you have questions after your visit or need to cancel or reschedule your appointment, please contact [facility]  Dept: [phone]  and follow the prompts.  Office hours are 8:00 a.m. to 4:30 p.m. Monday - Friday. Please  note that voicemails left after 4:00 p.m. may not be returned until the following business day.  We are closed weekends and major holidays. You have access to a nurse at all times for urgent questions. Please call the main number to the clinic Dept: [phone] and follow the prompts.   For any non-urgent questions, you may also contact your provider using MyChart. We now offer e-Visits for anyone 15 and older to request care online  for non-urgent symptoms. For details visit [email or web address].   Also download the MyChart app! Go to the app store, search "MyChart", open the app, select [facility], and log in with your MyChart username and password.  Due to Covid, a mask is required upon entering the hospital/clinic. If you do not have a mask, one will be given to you upon arrival. For doctor visits, patients may have 1 support person aged 37 or older with them. For treatment visits, patients cannot have anyone with them due to current Covid guidelines and our immunocompromised population.   Oxaliplatin Injection What is this medication? OXALIPLATIN (ox AL i PLA tin) is a chemotherapy drug. It targets fast dividing cells, like cancer cells, and causes these cells to die. This medicine is used to treat cancers of the colon and rectum, and many other cancers. This medicine may be used for other purposes; ask your health care provider or pharmacist if you have questions. COMMON BRAND NAME(S): Eloxatin What should I tell my care team before I take this medication? They need to know if you have any of these conditions: heart disease history of irregular heartbeat liver disease low blood counts, like white cells, platelets, or red blood cells lung or breathing disease, like asthma take medicines that treat or prevent blood clots tingling of the fingers or toes, or other nerve disorder an unusual or allergic reaction to oxaliplatin, other chemotherapy, other medicines, foods, dyes, or preservatives pregnant or trying to get pregnant breast-feeding How should I use this medication? This drug is given as an infusion into a vein. It is administered in a hospital or clinic by a specially trained health care professional. Talk to your pediatrician regarding the use of this medicine in children. Special care may be needed. Overdosage: If you think you have taken too much of this medicine contact a poison control center or  emergency room at once. NOTE: This medicine is only for you. Do not share this medicine with others. What if I miss a dose? It is important not to miss a dose. Call your doctor or health care professional if you are unable to keep an appointment. What may interact with this medication? Do not take this medicine with any of the following medications: cisapride dronedarone pimozide thioridazine This medicine may also interact with the following medications: aspirin and aspirin-like medicines certain medicines that treat or prevent blood clots like warfarin, apixaban, dabigatran, and rivaroxaban cisplatin cyclosporine diuretics medicines for infection like acyclovir, adefovir, amphotericin B, bacitracin, cidofovir, foscarnet, ganciclovir, gentamicin, pentamidine, vancomycin NSAIDs, medicines for pain and inflammation, like ibuprofen or naproxen other medicines that prolong the QT interval (an abnormal heart rhythm) pamidronate zoledronic acid This list may not describe all possible interactions. Give your health care provider a list of all the medicines, herbs, non-prescription drugs, or dietary supplements you use. Also tell them if you smoke, drink alcohol, or use illegal drugs. Some items may interact with your medicine. What should I watch for while using this medication? Your condition will be monitored carefully while you  are receiving this medicine. You may need blood work done while you are taking this medicine. This medicine may make you feel generally unwell. This is not uncommon as chemotherapy can affect healthy cells as well as cancer cells. Report any side effects. Continue your course of treatment even though you feel ill unless your healthcare professional tells you to stop. This medicine can make you more sensitive to cold. Do not drink cold drinks or use ice. Cover exposed skin before coming in contact with cold temperatures or cold objects. When out in cold weather wear warm  clothing and cover your mouth and nose to warm the air that goes into your lungs. Tell your doctor if you get sensitive to the cold. Do not become pregnant while taking this medicine or for 9 months after stopping it. Women should inform their health care professional if they wish to become pregnant or think they might be pregnant. Men should not father a child while taking this medicine and for 6 months after stopping it. There is potential for serious side effects to an unborn child. Talk to your health care professional for more information. Do not breast-feed a child while taking this medicine or for 3 months after stopping it. This medicine has caused ovarian failure in some women. This medicine may make it more difficult to get pregnant. Talk to your health care professional if you are concerned about your fertility. This medicine has caused decreased sperm counts in some men. This may make it more difficult to father a child. Talk to your health care professional if you are concerned about your fertility. This medicine may increase your risk of getting an infection. Call your health care professional for advice if you get a fever, chills, or sore throat, or other symptoms of a cold or flu. Do not treat yourself. Try to avoid being around people who are sick. Avoid taking medicines that contain aspirin, acetaminophen, ibuprofen, naproxen, or ketoprofen unless instructed by your health care professional. These medicines may hide a fever. Be careful brushing or flossing your teeth or using a toothpick because you may get an infection or bleed more easily. If you have any dental work done, tell your dentist you are receiving this medicine. What side effects may I notice from receiving this medication? Side effects that you should report to your doctor or health care professional as soon as possible: allergic reactions like skin rash, itching or hives, swelling of the face, lips, or tongue breathing  problems cough low blood counts - this medicine may decrease the number of white blood cells, red blood cells, and platelets. You may be at increased risk for infections and bleeding nausea, vomiting pain, redness, or irritation at site where injected pain, tingling, numbness in the hands or feet signs and symptoms of bleeding such as bloody or black, tarry stools; red or dark brown urine; spitting up blood or brown material that looks like coffee grounds; red spots on the skin; unusual bruising or bleeding from the eyes, gums, or nose signs and symptoms of a dangerous change in heartbeat or heart rhythm like chest pain; dizziness; fast, irregular heartbeat; palpitations; feeling faint or lightheaded; falls signs and symptoms of infection like fever; chills; cough; sore throat; pain or trouble passing urine signs and symptoms of liver injury like dark yellow or brown urine; general ill feeling or flu-like symptoms; light-colored stools; loss of appetite; nausea; right upper belly pain; unusually weak or tired; yellowing of the eyes or skin signs  and symptoms of low red blood cells or anemia such as unusually weak or tired; feeling faint or lightheaded; falls signs and symptoms of muscle injury like dark urine; trouble passing urine or change in the amount of urine; unusually weak or tired; muscle pain; back pain Side effects that usually do not require medical attention (report to your doctor or health care professional if they continue or are bothersome): changes in taste diarrhea gas hair loss loss of appetite mouth sores This list may not describe all possible side effects. Call your doctor for medical advice about side effects. You may report side effects to FDA at 1-800-FDA-1088. Where should I keep my medication? This drug is given in a hospital or clinic and will not be stored at home. NOTE: This sheet is a summary. It may not cover all possible information. If you have questions about  this medicine, talk to your doctor, pharmacist, or health care provider.  2022 Elsevier/Gold Standard (2020-11-25 00:00:00)  Irinotecan injection What is this medication? IRINOTECAN (ir in oh TEE kan ) is a chemotherapy drug. It is used to treat colon and rectal cancer. This medicine may be used for other purposes; ask your health care provider or pharmacist if you have questions. COMMON BRAND NAME(S): Camptosar What should I tell my care team before I take this medication? They need to know if you have any of these conditions: dehydration diarrhea infection (especially a virus infection such as chickenpox, cold sores, or herpes) liver disease low blood counts, like low white cell, platelet, or red cell counts low levels of calcium, magnesium, or potassium in the blood recent or ongoing radiation therapy an unusual or allergic reaction to irinotecan, other medicines, foods, dyes, or preservatives pregnant or trying to get pregnant breast-feeding How should I use this medication? This drug is given as an infusion into a vein. It is administered in a hospital or clinic by a specially trained health care professional. Talk to your pediatrician regarding the use of this medicine in children. Special care may be needed. Overdosage: If you think you have taken too much of this medicine contact a poison control center or emergency room at once. NOTE: This medicine is only for you. Do not share this medicine with others. What if I miss a dose? It is important not to miss your dose. Call your doctor or health care professional if you are unable to keep an appointment. What may interact with this medication? Do not take this medicine with any of the following medications: cobicistat itraconazole This medicine may interact with the following medications: antiviral medicines for HIV or AIDS certain antibiotics like rifampin or rifabutin certain medicines for fungal infections like  ketoconazole, posaconazole, and voriconazole certain medicines for seizures like carbamazepine, phenobarbital, phenotoin clarithromycin gemfibrozil nefazodone [facility]. John's Wort This list may not describe all possible interactions. Give your health care provider a list of all the medicines, herbs, non-prescription drugs, or dietary supplements you use. Also tell them if you smoke, drink alcohol, or use illegal drugs. Some items may interact with your medicine. What should I watch for while using this medication? Your condition will be monitored carefully while you are receiving this medicine. You will need important blood work done while you are taking this medicine. This drug may make you feel generally unwell. This is not uncommon, as chemotherapy can affect healthy cells as well as cancer cells. Report any side effects. Continue your course of treatment even though you feel ill unless your doctor  tells you to stop. In some cases, you may be given additional medicines to help with side effects. Follow all directions for their use. You may get drowsy or dizzy. Do not drive, use machinery, or do anything that needs mental alertness until you know how this medicine affects you. Do not stand or sit up quickly, especially if you are an older patient. This reduces the risk of dizzy or fainting spells. Call your health care professional for advice if you get a fever, chills, or sore throat, or other symptoms of a cold or flu. Do not treat yourself. This medicine decreases your body's ability to fight infections. Try to avoid being around people who are sick. Avoid taking products that contain aspirin, acetaminophen, ibuprofen, naproxen, or ketoprofen unless instructed by your doctor. These medicines may hide a fever. This medicine may increase your risk to bruise or bleed. Call your doctor or health care professional if you notice any unusual bleeding. Be careful brushing and flossing your teeth or using a  toothpick because you may get an infection or bleed more easily. If you have any dental work done, tell your dentist you are receiving this medicine. Do not become pregnant while taking this medicine or for 6 months after stopping it. Women should inform their health care professional if they wish to become pregnant or think they might be pregnant. Men should not father a child while taking this medicine and for 3 months after stopping it. There is potential for serious side effects to an unborn child. Talk to your health care professional for more information. Do not breast-feed an infant while taking this medicine or for 7 days after stopping it. This medicine has caused ovarian failure in some women. This medicine may make it more difficult to get pregnant. Talk to your health care professional if you are concerned about your fertility. This medicine has caused decreased sperm counts in some men. This may make it more difficult to father a child. Talk to your health care professional if you are concerned about your fertility. What side effects may I notice from receiving this medication? Side effects that you should report to your doctor or health care professional as soon as possible: allergic reactions like skin rash, itching or hives, swelling of the face, lips, or tongue chest pain diarrhea flushing, runny nose, sweating during infusion low blood counts - this medicine may decrease the number of white blood cells, red blood cells and platelets. You may be at increased risk for infections and bleeding. nausea, vomiting pain, swelling, warmth in the leg signs of decreased platelets or bleeding - bruising, pinpoint red spots on the skin, black, tarry stools, blood in the urine signs of infection - fever or chills, cough, sore throat, pain or difficulty passing urine signs of decreased red blood cells - unusually weak or tired, fainting spells, lightheadedness Side effects that usually do not  require medical attention (report to your doctor or health care professional if they continue or are bothersome): constipation hair loss headache loss of appetite mouth sores stomach pain This list may not describe all possible side effects. Call your doctor for medical advice about side effects. You may report side effects to FDA at 1-800-FDA-1088. Where should I keep my medication? This drug is given in a hospital or clinic and will not be stored at home. NOTE: This sheet is a summary. It may not cover all possible information. If you have questions about this medicine, talk to your doctor, pharmacist,  or health care provider.  2022 Elsevier/Gold Standard (2020-11-25 00:00:00)  Leucovorin injection What is this medication? LEUCOVORIN (loo koe VOR in) is used to prevent or treat the harmful effects of some medicines. This medicine is used to treat anemia caused by a low amount of folic acid in the body. It is also used with 5-fluorouracil (5-FU) to treat colon cancer. This medicine may be used for other purposes; ask your health care provider or pharmacist if you have questions. What should I tell my care team before I take this medication? They need to know if you have any of these conditions: anemia from low levels of vitamin B-12 in the blood an unusual or allergic reaction to leucovorin, folic acid, other medicines, foods, dyes, or preservatives pregnant or trying to get pregnant breast-feeding How should I use this medication? This medicine is for injection into a muscle or into a vein. It is given by a health care professional in a hospital or clinic setting. Talk to your pediatrician regarding the use of this medicine in children. Special care may be needed. Overdosage: If you think you have taken too much of this medicine contact a poison control center or emergency room at once. NOTE: This medicine is only for you. Do not share this medicine with others. What if I miss a  dose? This does not apply. What may interact with this medication? capecitabine fluorouracil phenobarbital phenytoin primidone trimethoprim-sulfamethoxazole This list may not describe all possible interactions. Give your health care provider a list of all the medicines, herbs, non-prescription drugs, or dietary supplements you use. Also tell them if you smoke, drink alcohol, or use illegal drugs. Some items may interact with your medicine. What should I watch for while using this medication? Your condition will be monitored carefully while you are receiving this medicine. This medicine may increase the side effects of 5-fluorouracil, 5-FU. Tell your doctor or health care professional if you have diarrhea or mouth sores that do not get better or that get worse. What side effects may I notice from receiving this medication? Side effects that you should report to your doctor or health care professional as soon as possible: allergic reactions like skin rash, itching or hives, swelling of the face, lips, or tongue breathing problems fever, infection mouth sores unusual bleeding or bruising unusually weak or tired Side effects that usually do not require medical attention (report to your doctor or health care professional if they continue or are bothersome): constipation or diarrhea loss of appetite nausea, vomiting This list may not describe all possible side effects. Call your doctor for medical advice about side effects. You may report side effects to FDA at 1-800-FDA-1088. Where should I keep my medication? This drug is given in a hospital or clinic and will not be stored at home. NOTE: This sheet is a summary. It may not cover all possible information. If you have questions about this medicine, talk to your doctor, pharmacist, or health care provider.  2022 Elsevier/Gold Standard (2007-09-14 00:00:00)  Fluorouracil, 5-FU injection What is this medication? FLUOROURACIL, 5-FU (flure oh  YOOR a sil) is a chemotherapy drug. It slows the growth of cancer cells. This medicine is used to treat many types of cancer like breast cancer, colon or rectal cancer, pancreatic cancer, and stomach cancer. This medicine may be used for other purposes; ask your health care provider or pharmacist if you have questions. COMMON BRAND NAME(S): Adrucil What should I tell my care team before I take this medication?  They need to know if you have any of these conditions: blood disorders dihydropyrimidine dehydrogenase (DPD) deficiency infection (especially a virus infection such as chickenpox, cold sores, or herpes) kidney disease liver disease malnourished, poor nutrition recent or ongoing radiation therapy an unusual or allergic reaction to fluorouracil, other chemotherapy, other medicines, foods, dyes, or preservatives pregnant or trying to get pregnant breast-feeding How should I use this medication? This drug is given as an infusion or injection into a vein. It is administered in a hospital or clinic by a specially trained health care professional. Talk to your pediatrician regarding the use of this medicine in children. Special care may be needed. Overdosage: If you think you have taken too much of this medicine contact a poison control center or emergency room at once. NOTE: This medicine is only for you. Do not share this medicine with others. What if I miss a dose? It is important not to miss your dose. Call your doctor or health care professional if you are unable to keep an appointment. What may interact with this medication? Do not take this medicine with any of the following medications: live virus vaccines This medicine may also interact with the following medications: medicines that treat or prevent blood clots like warfarin, enoxaparin, and dalteparin This list may not describe all possible interactions. Give your health care provider a list of all the medicines, herbs,  non-prescription drugs, or dietary supplements you use. Also tell them if you smoke, drink alcohol, or use illegal drugs. Some items may interact with your medicine. What should I watch for while using this medication? Visit your doctor for checks on your progress. This drug may make you feel generally unwell. This is not uncommon, as chemotherapy can affect healthy cells as well as cancer cells. Report any side effects. Continue your course of treatment even though you feel ill unless your doctor tells you to stop. In some cases, you may be given additional medicines to help with side effects. Follow all directions for their use. Call your doctor or health care professional for advice if you get a fever, chills or sore throat, or other symptoms of a cold or flu. Do not treat yourself. This drug decreases your body's ability to fight infections. Try to avoid being around people who are sick. This medicine may increase your risk to bruise or bleed. Call your doctor or health care professional if you notice any unusual bleeding. Be careful brushing and flossing your teeth or using a toothpick because you may get an infection or bleed more easily. If you have any dental work done, tell your dentist you are receiving this medicine. Avoid taking products that contain aspirin, acetaminophen, ibuprofen, naproxen, or ketoprofen unless instructed by your doctor. These medicines may hide a fever. Do not become pregnant while taking this medicine. Women should inform their doctor if they wish to become pregnant or think they might be pregnant. There is a potential for serious side effects to an unborn child. Talk to your health care professional or pharmacist for more information. Do not breast-feed an infant while taking this medicine. Men should inform their doctor if they wish to father a child. This medicine may lower sperm counts. Do not treat diarrhea with over the counter products. Contact your doctor if you  have diarrhea that lasts more than 2 days or if it is severe and watery. This medicine can make you more sensitive to the sun. Keep out of the sun. If you cannot avoid  being in the sun, wear protective clothing and use sunscreen. Do not use sun lamps or tanning beds/booths. What side effects may I notice from receiving this medication? Side effects that you should report to your doctor or health care professional as soon as possible: allergic reactions like skin rash, itching or hives, swelling of the face, lips, or tongue low blood counts - this medicine may decrease the number of white blood cells, red blood cells and platelets. You may be at increased risk for infections and bleeding. signs of infection - fever or chills, cough, sore throat, pain or difficulty passing urine signs of decreased platelets or bleeding - bruising, pinpoint red spots on the skin, black, tarry stools, blood in the urine signs of decreased red blood cells - unusually weak or tired, fainting spells, lightheadedness breathing problems changes in vision chest pain mouth sores nausea and vomiting pain, swelling, redness at site where injected pain, tingling, numbness in the hands or feet redness, swelling, or sores on hands or feet stomach pain unusual bleeding Side effects that usually do not require medical attention (report to your doctor or health care professional if they continue or are bothersome): changes in finger or toe nails diarrhea dry or itchy skin hair loss headache loss of appetite sensitivity of eyes to the light stomach upset unusually teary eyes This list may not describe all possible side effects. Call your doctor for medical advice about side effects. You may report side effects to FDA at 1-800-FDA-1088. Where should I keep my medication? This drug is given in a hospital or clinic and will not be stored at home. NOTE: This sheet is a summary. It may not cover all possible information. If  you have questions about this medicine, talk to your doctor, pharmacist, or health care provider.  2022 Elsevier/Gold Standard (2020-11-25 00:00:00)

## 2021-02-09 NOTE — Progress Notes (Signed)
Patient presents for treatment. RN assessment completed along with the following:  Labs/vitals reviewed - Yes, and within treatment parameters.   Weight within 10% of previous measurement - Yes Oncology Treatment Attestation completed for current therapy- Yes, on date 12/10/20 Informed consent completed and reflects current therapy/intent - Yes, on date 01/01/21             Provider progress note reviewed - Yes, today's provider note was reviewed. Treatment/Antibody/Supportive plan reviewed - Yes, and there are no adjustments needed for today's treatment. S&H and other orders reviewed - Yes, and there are no additional orders identified. Previous treatment date reviewed - Yes, and the appropriate amount of time has elapsed between treatments.  Patient to proceed with treatment.    1321--Pt complained of stomach cramping. Irinotecan pause, 10 mL NS flush administered, atropine IV administered, and 10 mL NS flush administered.  Irinotecan resumed and patient able to tolerate completion of infusion without any issues.

## 2021-02-09 NOTE — Progress Notes (Signed)
Patient seen by Dr. Sherrill today ? ?Vitals are within treatment parameters. ? ?Labs reviewed by Dr. Sherrill and are within treatment parameters. ? ?Per physician team, patient is ready for treatment and there are NO modifications to the treatment plan.  ?

## 2021-02-10 LAB — CANCER ANTIGEN 19-9: CA 19-9: 486 U/mL — ABNORMAL HIGH (ref 0–35)

## 2021-02-11 ENCOUNTER — Inpatient Hospital Stay: Payer: Medicare Other

## 2021-02-11 ENCOUNTER — Other Ambulatory Visit (HOSPITAL_COMMUNITY): Payer: Medicare Other

## 2021-02-11 ENCOUNTER — Other Ambulatory Visit: Payer: Self-pay

## 2021-02-11 VITALS — BP 116/77 | HR 55 | Temp 97.8°F | Resp 20

## 2021-02-11 DIAGNOSIS — Z5111 Encounter for antineoplastic chemotherapy: Secondary | ICD-10-CM | POA: Diagnosis not present

## 2021-02-11 DIAGNOSIS — C250 Malignant neoplasm of head of pancreas: Secondary | ICD-10-CM

## 2021-02-11 MED ORDER — SODIUM CHLORIDE 0.9% FLUSH
10.0000 mL | INTRAVENOUS | Status: DC | PRN
  Administered 2021-02-11: 10 mL

## 2021-02-11 MED ORDER — HEPARIN SOD (PORK) LOCK FLUSH 100 UNIT/ML IV SOLN
500.0000 [IU] | Freq: Once | INTRAVENOUS | Status: AC | PRN
  Administered 2021-02-11: 500 [IU]

## 2021-02-11 NOTE — Patient Instructions (Signed)
[facility]  Discharge Instructions: Thank you for choosing [facility] to provide your oncology and hematology care.   If you have a lab appointment with the [facility], please go directly to the [facility] and check in at the registration area.   Wear comfortable clothing and clothing appropriate for easy access to any Portacath or PICC line.   We strive to give you quality time with your provider. You may need to reschedule your appointment if you arrive late (15 or more minutes).  Arriving late affects you and other patients whose appointments are after yours.  Also, if you miss three or more appointments without notifying the office, you may be dismissed from the clinic at the provider's discretion.      For prescription refill requests, have your pharmacy contact our office and allow 72 hours for refills to be completed.    Today you received the following chemotherapy and/or immunotherapy agents: 69fu  Fluorouracil, 5-FU injection What is this medication? FLUOROURACIL, 5-FU (flure oh YOOR a sil) is a chemotherapy drug. It slows the growth of cancer cells. This medicine is used to treat many types of cancer like breast cancer, colon or rectal cancer, pancreatic cancer, and stomach cancer. This medicine may be used for other purposes; ask your health care provider or pharmacist if you have questions. COMMON BRAND NAME(S): Adrucil What should I tell my care team before I take this medication? They need to know if you have any of these conditions: blood disorders dihydropyrimidine dehydrogenase (DPD) deficiency infection (especially a virus infection such as chickenpox, cold sores, or herpes) kidney disease liver disease malnourished, poor nutrition recent or ongoing radiation therapy an unusual or allergic reaction to fluorouracil, other chemotherapy, other medicines, foods, dyes, or preservatives pregnant or trying to get  pregnant breast-feeding How should I use this medication? This drug is given as an infusion or injection into a vein. It is administered in a hospital or clinic by a specially trained health care professional. Talk to your pediatrician regarding the use of this medicine in children. Special care may be needed. Overdosage: If you think you have taken too much of this medicine contact a poison control center or emergency room at once. NOTE: This medicine is only for you. Do not share this medicine with others. What if I miss a dose? It is important not to miss your dose. Call your doctor or health care professional if you are unable to keep an appointment. What may interact with this medication? Do not take this medicine with any of the following medications: live virus vaccines This medicine may also interact with the following medications: medicines that treat or prevent blood clots like warfarin, enoxaparin, and dalteparin This list may not describe all possible interactions. Give your health care provider a list of all the medicines, herbs, non-prescription drugs, or dietary supplements you use. Also tell them if you smoke, drink alcohol, or use illegal drugs. Some items may interact with your medicine. What should I watch for while using this medication? Visit your doctor for checks on your progress. This drug may make you feel generally unwell. This is not uncommon, as chemotherapy can affect healthy cells as well as cancer cells. Report any side effects. Continue your course of treatment even though you feel ill unless your doctor tells you to stop. In some cases, you may be given additional medicines to help with side effects. Follow all directions for their use. Call your  doctor or health care professional for advice if you get a fever, chills or sore throat, or other symptoms of a cold or flu. Do not treat yourself. This drug decreases your body's ability to fight infections. Try to avoid  being around people who are sick. This medicine may increase your risk to bruise or bleed. Call your doctor or health care professional if you notice any unusual bleeding. Be careful brushing and flossing your teeth or using a toothpick because you may get an infection or bleed more easily. If you have any dental work done, tell your dentist you are receiving this medicine. Avoid taking products that contain aspirin, acetaminophen, ibuprofen, naproxen, or ketoprofen unless instructed by your doctor. These medicines may hide a fever. Do not become pregnant while taking this medicine. Women should inform their doctor if they wish to become pregnant or think they might be pregnant. There is a potential for serious side effects to an unborn child. Talk to your health care professional or pharmacist for more information. Do not breast-feed an infant while taking this medicine. Men should inform their doctor if they wish to father a child. This medicine may lower sperm counts. Do not treat diarrhea with over the counter products. Contact your doctor if you have diarrhea that lasts more than 2 days or if it is severe and watery. This medicine can make you more sensitive to the sun. Keep out of the sun. If you cannot avoid being in the sun, wear protective clothing and use sunscreen. Do not use sun lamps or tanning beds/booths. What side effects may I notice from receiving this medication? Side effects that you should report to your doctor or health care professional as soon as possible: allergic reactions like skin rash, itching or hives, swelling of the face, lips, or tongue low blood counts - this medicine may decrease the number of white blood cells, red blood cells and platelets. You may be at increased risk for infections and bleeding. signs of infection - fever or chills, cough, sore throat, pain or difficulty passing urine signs of decreased platelets or bleeding - bruising, pinpoint red spots on the  skin, black, tarry stools, blood in the urine signs of decreased red blood cells - unusually weak or tired, fainting spells, lightheadedness breathing problems changes in vision chest pain mouth sores nausea and vomiting pain, swelling, redness at site where injected pain, tingling, numbness in the hands or feet redness, swelling, or sores on hands or feet stomach pain unusual bleeding Side effects that usually do not require medical attention (report to your doctor or health care professional if they continue or are bothersome): changes in finger or toe nails diarrhea dry or itchy skin hair loss headache loss of appetite sensitivity of eyes to the light stomach upset unusually teary eyes This list may not describe all possible side effects. Call your doctor for medical advice about side effects. You may report side effects to FDA at 1-800-FDA-1088. Where should I keep my medication? This drug is given in a hospital or clinic and will not be stored at home. NOTE: This sheet is a summary. It may not cover all possible information. If you have questions about this medicine, talk to your doctor, pharmacist, or health care provider.  2022 Elsevier/Gold Standard (2020-11-25 00:00:00)     To help prevent nausea and vomiting after your treatment, we encourage you to take your nausea medication as directed.  BELOW ARE SYMPTOMS THAT SHOULD BE REPORTED IMMEDIATELY: *FEVER GREATER THAN  100.4 F (38 C) OR HIGHER *CHILLS OR SWEATING *NAUSEA AND VOMITING THAT IS NOT CONTROLLED WITH YOUR NAUSEA MEDICATION *UNUSUAL SHORTNESS OF BREATH *UNUSUAL BRUISING OR BLEEDING *URINARY PROBLEMS (pain or burning when urinating, or frequent urination) *BOWEL PROBLEMS (unusual diarrhea, constipation, pain near the anus) TENDERNESS IN MOUTH AND THROAT WITH OR WITHOUT PRESENCE OF ULCERS (sore throat, sores in mouth, or a toothache) UNUSUAL RASH, SWELLING OR PAIN  UNUSUAL VAGINAL DISCHARGE OR ITCHING    Items with * indicate a potential emergency and should be followed up as soon as possible or go to the Emergency Department if any problems should occur.  Please show the CHEMOTHERAPY ALERT CARD or IMMUNOTHERAPY ALERT CARD at check-in to the Emergency Department and triage nurse.  Should you have questions after your visit or need to cancel or reschedule your appointment, please contact [facility]  Dept: [phone]  and follow the prompts.  Office hours are 8:00 a.m. to 4:30 p.m. Monday - Friday. Please note that voicemails left after 4:00 p.m. may not be returned until the following business day.  We are closed weekends and major holidays. You have access to a nurse at all times for urgent questions. Please call the main number to the clinic Dept: [phone] and follow the prompts.   For any non-urgent questions, you may also contact your provider using MyChart. We now offer e-Visits for anyone 60 and older to request care online for non-urgent symptoms. For details visit [email or web address].   Also download the MyChart app! Go to the app store, search "MyChart", open the app, select [facility], and log in with your MyChart username and password.  Due to Covid, a mask is required upon entering the hospital/clinic. If you do not have a mask, one will be given to you upon arrival. For doctor visits, patients may have 1 support person aged 45 or older with them. For treatment visits, patients cannot have anyone with them due to current Covid guidelines and our immunocompromised population.

## 2021-02-18 ENCOUNTER — Other Ambulatory Visit (HOSPITAL_COMMUNITY): Payer: Self-pay | Admitting: Interventional Radiology

## 2021-02-18 ENCOUNTER — Ambulatory Visit (HOSPITAL_COMMUNITY)
Admission: RE | Admit: 2021-02-18 | Discharge: 2021-02-18 | Disposition: A | Discharge status: Home / Self Care | Payer: Medicare Other | Source: Ambulatory Visit | Attending: Interventional Radiology | Admitting: Interventional Radiology

## 2021-02-18 ENCOUNTER — Other Ambulatory Visit: Payer: Self-pay

## 2021-02-18 DIAGNOSIS — C250 Malignant neoplasm of head of pancreas: Secondary | ICD-10-CM | POA: Diagnosis not present

## 2021-02-18 DIAGNOSIS — Z434 Encounter for attention to other artificial openings of digestive tract: Secondary | ICD-10-CM | POA: Insufficient documentation

## 2021-02-18 DIAGNOSIS — K810 Acute cholecystitis: Secondary | ICD-10-CM

## 2021-02-18 HISTORY — PX: IR REMOVAL BILIARY DRAIN: IMG6047

## 2021-02-18 IMAGING — XA IR REMOVE BILIARY DRAIN
2 series · 6 of 6 positions shown · non-contrast
Comparison: none

INDICATION: 70-year-old male with history of pancreatic head mass requiring
common bile duct stent placement which was complicated postprocedure
early by acute cholecystitis requiring percutaneous cholecystostomy
tube placement on [DATE]. The patient last presented on
[DATE] at which time the cystic and common bile ducts are widely
patent. A capping trial was initiated. The capping trial as been
tolerated well in the patient is now motivated to have the tube
removed.

[Series 1: ir catheter tube change · 4 of 100 frames shown (1 of 2)]
[frame 16/100]
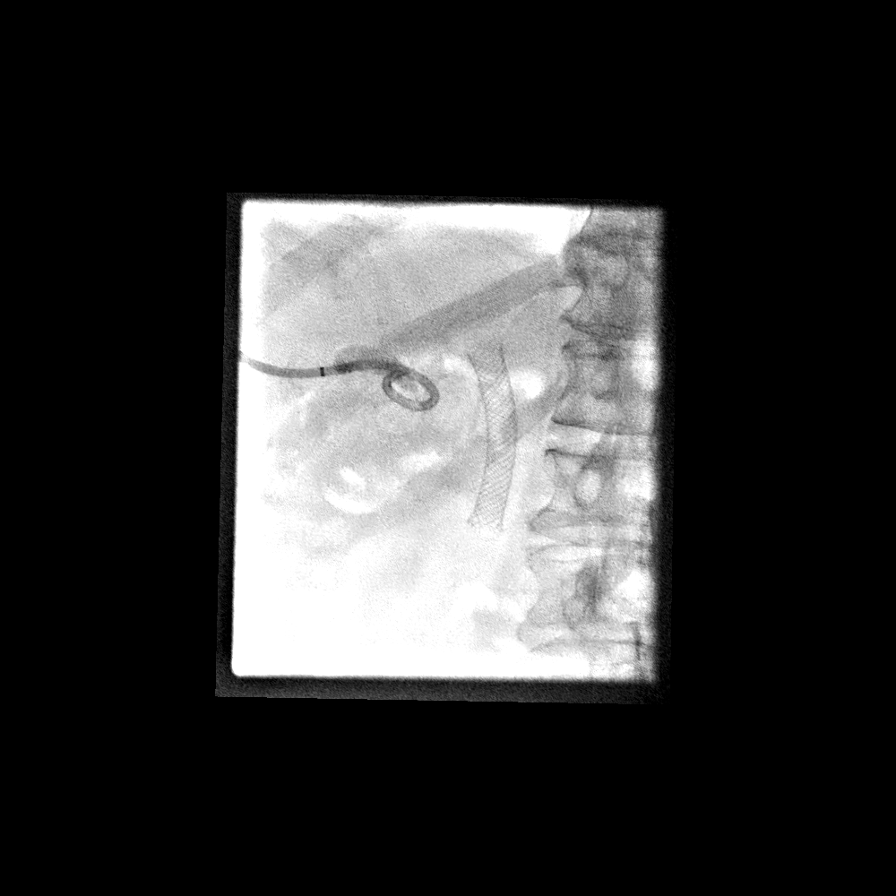
[frame 28/100]
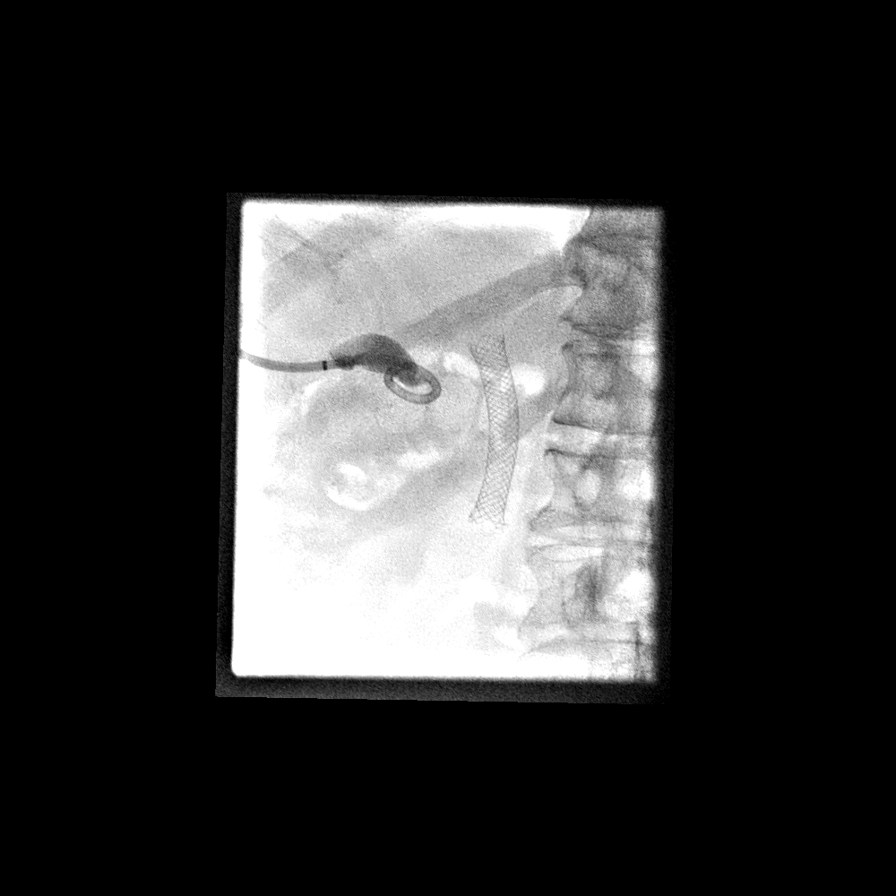
[frame 51/100]
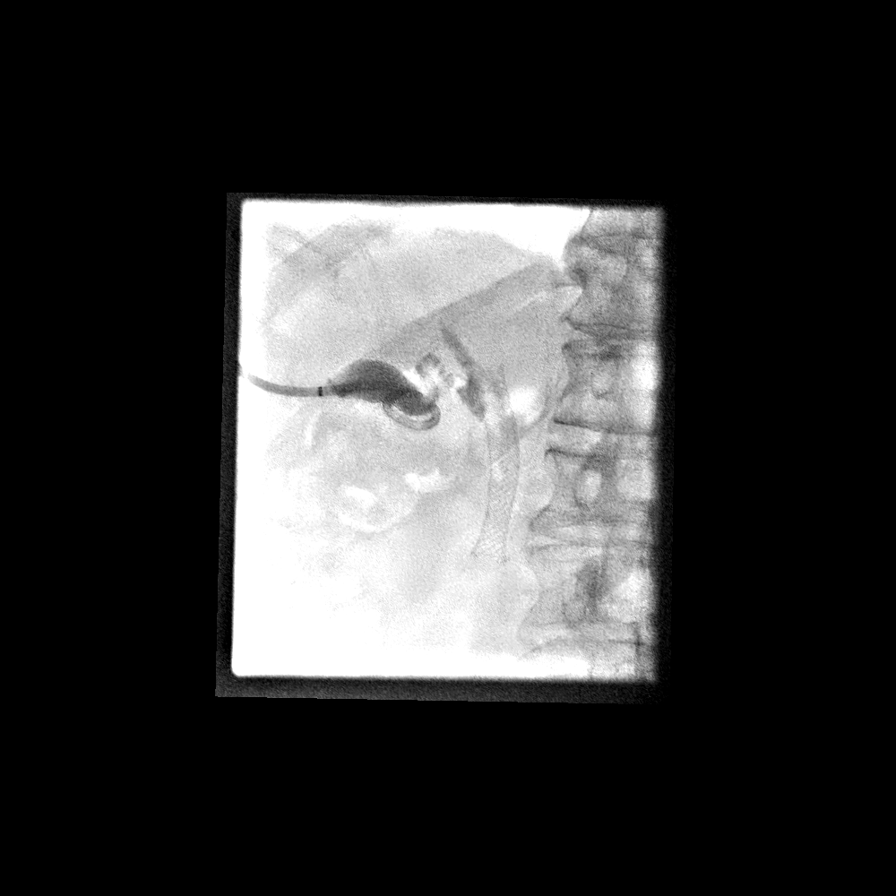
[frame 86/100]
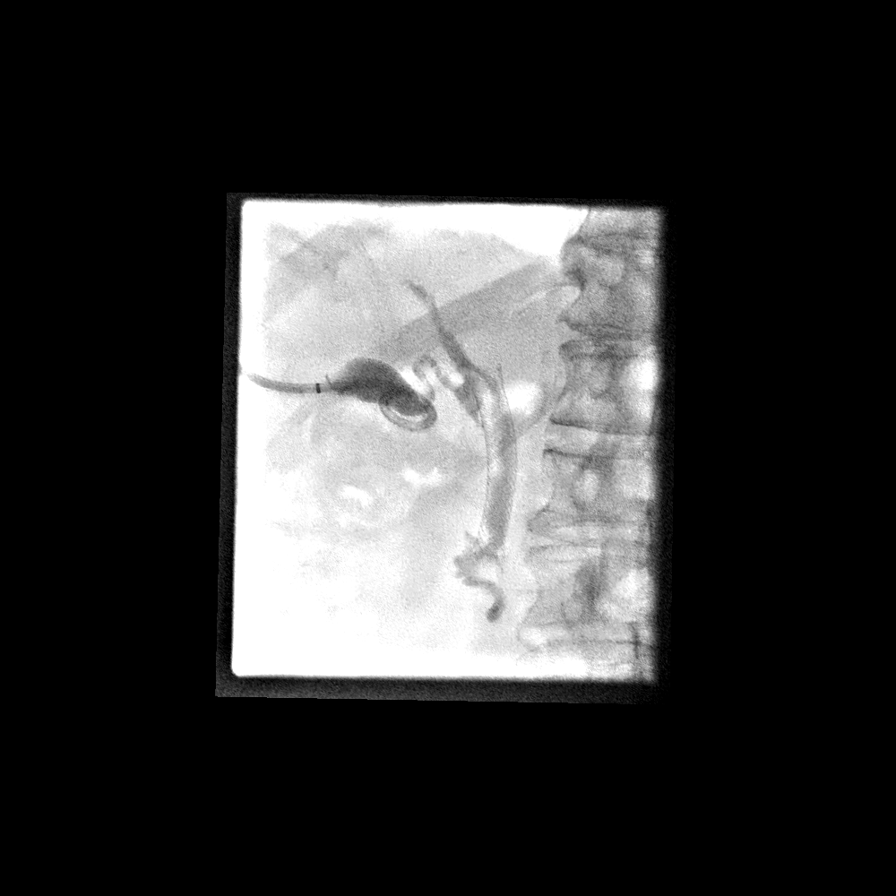

[Series 1: ir catheter tube change · 2 of 2 slices shown (2 of 2)]
[im 1/2]
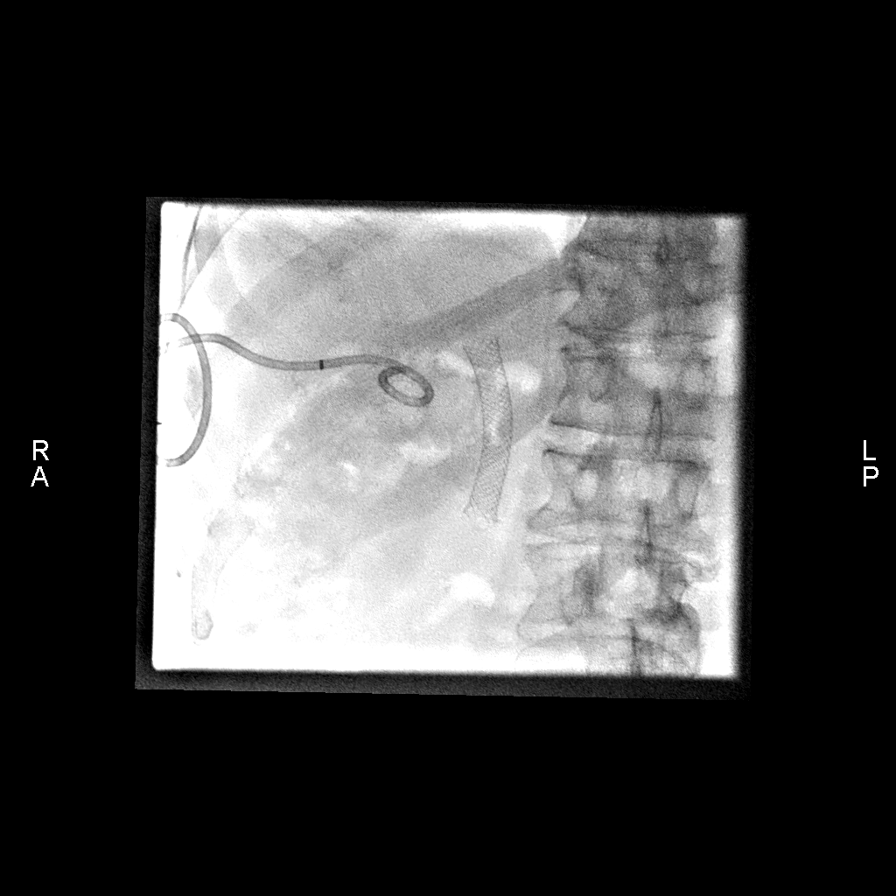
[im 2/2]
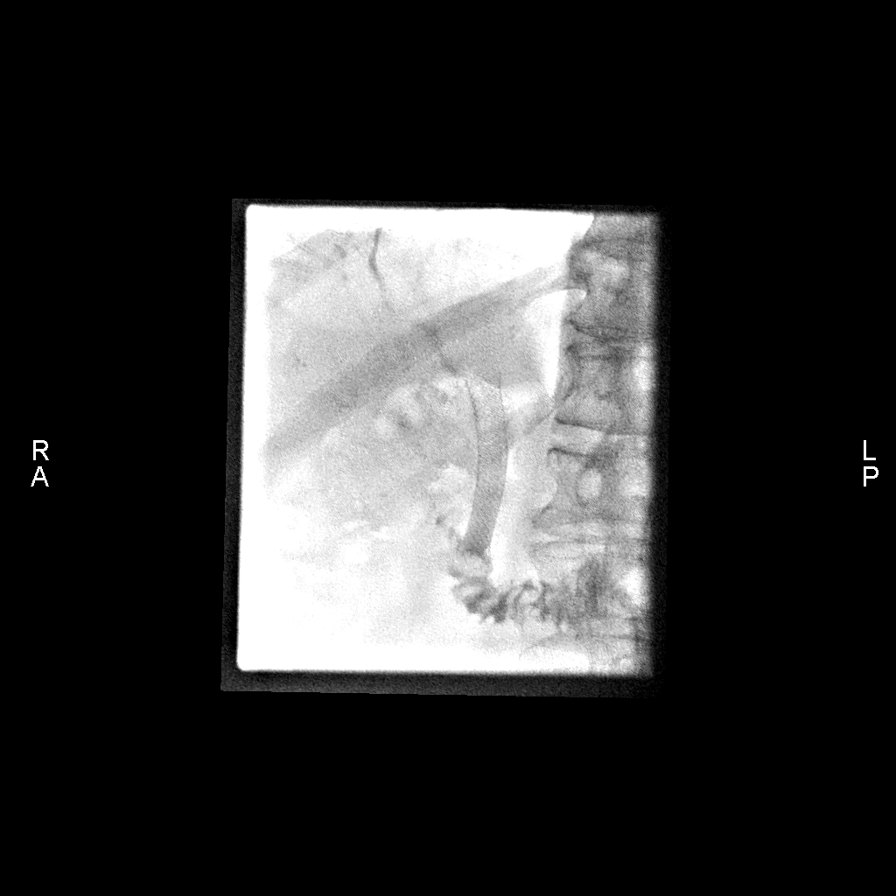

[6 of 6 positions shown; findings below may reference images not displayed]

EXAM:
Fluoroscopic cholecystostomy tube check and removal.

MEDICATIONS:
None.

ANESTHESIA/SEDATION:
None.

FLUOROSCOPY TIME:  Fluoroscopy Time: 0 minutes 36 seconds (3 mGy).

CONTRAST:  5 mL Omnipaque 300, biliary

COMPLICATIONS:
None immediate.

PROCEDURE:
Informed written consent was obtained from the patient after a
thorough discussion of the procedural risks, benefits and
alternatives. All questions were addressed. Maximal Sterile Barrier
Technique was utilized including caps, mask, sterile gowns, sterile
gloves, sterile drape, hand hygiene and skin antiseptic. A timeout
was performed prior to the initiation of the procedure.

The indwelling cholecystostomy tube was prepped and draped in
standard fashion. Preprocedure scout fluoroscopic image static
straight it unchanged position in the right upper quadrant. The
indwelling common bile duct stent is in unchanged position. Gentle
hand injection demonstrated brisk antegrade flow through the normal
appearing, decompressed gallbladder through the cystic duct and
through the indwelling common bile duct stent which appears patent.
Contrast flows freely into the duodenum.

The external portion of the tube was cut to release the pigtail and
the drain was removed over a wire to observe for hemorrhage, none of
which was visualized. The wire was removed and a sterile bandage was
applied. The patient tolerated the procedure well was discharged
home in good condition.
IMPRESSION: Widely patent cystic duct and patent indwelling common bile duct
stent. Given tolerance of nearly 1 month capping trial, decision was
made to remove the cholecystostomy tube.

## 2021-02-18 MED ORDER — IOHEXOL 300 MG/ML  SOLN
10.0000 mL | Freq: Once | INTRAMUSCULAR | Status: AC | PRN
  Administered 2021-02-18: 5 mL

## 2021-02-18 NOTE — Procedures (Signed)
Interventional Radiology Procedure Note  Procedure: Cholecystostomy tube check and removal  Findings: Please refer to procedural dictation for full description. Widely patent cystic duct and indwelling common bile duct stent.  After thorough discussion with patient, decision was made to remove cholecystostomy tube.  Complications: None  Estimated Blood Loss: < 5 mL  Recommendations: Follow up with IR as needed.   Ruthann Cancer, MD Pager: [phone]

## 2021-02-21 ENCOUNTER — Other Ambulatory Visit: Payer: Self-pay | Admitting: Oncology

## 2021-02-24 ENCOUNTER — Inpatient Hospital Stay: Payer: Medicare Other | Attending: Nurse Practitioner | Admitting: Oncology

## 2021-02-24 ENCOUNTER — Inpatient Hospital Stay: Payer: Medicare Other

## 2021-02-24 ENCOUNTER — Other Ambulatory Visit: Payer: Self-pay

## 2021-02-24 VITALS — BP 135/86 | HR 72 | Temp 97.9°F | Resp 18 | Ht 68.0 in | Wt 150.0 lb

## 2021-02-24 DIAGNOSIS — T451X5A Adverse effect of antineoplastic and immunosuppressive drugs, initial encounter: Secondary | ICD-10-CM | POA: Diagnosis not present

## 2021-02-24 DIAGNOSIS — C251 Malignant neoplasm of body of pancreas: Secondary | ICD-10-CM | POA: Insufficient documentation

## 2021-02-24 DIAGNOSIS — D701 Agranulocytosis secondary to cancer chemotherapy: Secondary | ICD-10-CM | POA: Insufficient documentation

## 2021-02-24 DIAGNOSIS — Z5111 Encounter for antineoplastic chemotherapy: Secondary | ICD-10-CM | POA: Diagnosis present

## 2021-02-24 DIAGNOSIS — C250 Malignant neoplasm of head of pancreas: Secondary | ICD-10-CM | POA: Diagnosis present

## 2021-02-24 LAB — CMP (CANCER CENTER ONLY)
ALT: 32 U/L (ref 0–44)
AST: 35 U/L (ref 15–41)
Albumin: 3.2 g/dL — ABNORMAL LOW (ref 3.5–5.0)
Alkaline Phosphatase: 142 U/L — ABNORMAL HIGH (ref 38–126)
Anion gap: 6 (ref 5–15)
BUN: 9 mg/dL (ref 8–23)
CO2: 26 mmol/L (ref 22–32)
Calcium: 8.3 mg/dL — ABNORMAL LOW (ref 8.9–10.3)
Chloride: 108 mmol/L (ref 98–111)
Creatinine: 0.77 mg/dL (ref 0.61–1.24)
GFR, Estimated: >60 mL/min (ref >60)
Glucose, Bld: 251 mg/dL — ABNORMAL HIGH (ref 70–99)
Potassium: 3.6 mmol/L (ref 3.5–5.1)
Sodium: 140 mmol/L (ref 135–145)
Total Bilirubin: 0.3 mg/dL (ref 0.3–1.2)
Total Protein: 5.2 g/dL — ABNORMAL LOW (ref 6.5–8.1)

## 2021-02-24 LAB — CBC WITH DIFFERENTIAL (CANCER CENTER ONLY)
Abs Immature Granulocytes: 0.03 10*3/uL (ref 0.00–0.07)
Basophils Absolute: 0.1 10*3/uL (ref 0.0–0.1)
Basophils Relative: 2 %
Eosinophils Absolute: 0.4 10*3/uL (ref 0.0–0.5)
Eosinophils Relative: 9 %
HCT: 27.5 % — ABNORMAL LOW (ref 39.0–52.0)
Hemoglobin: 9.2 g/dL — ABNORMAL LOW (ref 13.0–17.0)
Immature Granulocytes: 1 %
Lymphocytes Relative: 48 %
Lymphs Abs: 2.2 10*3/uL (ref 0.7–4.0)
MCH: 30.3 pg (ref 26.0–34.0)
MCHC: 33.5 g/dL (ref 30.0–36.0)
MCV: 90.5 fL (ref 80.0–100.0)
Monocytes Absolute: 0.7 10*3/uL (ref 0.1–1.0)
Monocytes Relative: 15 %
Neutro Abs: 1.1 10*3/uL — ABNORMAL LOW (ref 1.7–7.7)
Neutrophils Relative %: 25 %
Platelet Count: 176 10*3/uL (ref 150–400)
RBC: 3.04 MIL/uL — ABNORMAL LOW (ref 4.22–5.81)
RDW: 15 % (ref 11.5–15.5)
WBC Count: 4.5 10*3/uL (ref 4.0–10.5)
nRBC: 0 % (ref 0.0–0.2)

## 2021-02-24 MED ORDER — HEPARIN SOD (PORK) LOCK FLUSH 100 UNIT/ML IV SOLN
500.0000 [IU] | Freq: Once | INTRAVENOUS | Status: AC
  Administered 2021-02-24: 500 [IU] via INTRAVENOUS

## 2021-02-24 MED ORDER — SODIUM CHLORIDE 0.9% FLUSH
10.0000 mL | INTRAVENOUS | Status: DC | PRN
  Administered 2021-02-24: 10 mL via INTRAVENOUS

## 2021-02-24 NOTE — Progress Notes (Signed)
[facility] OFFICE PROGRESS NOTE   Diagnosis: Pancreas cancer  INTERVAL HISTORY:   Francisco Frank completed another cycle of FOLFIRINOX on 02/09/2021.  He reports nausea/vomiting, diarrhea, and cramping abdominal discomfort on the evening of day 7.  The symptoms resolved by the next morning.  He had a syncope event that evening.  He reports cold sensitivity lasting for a week following chemotherapy.  This has resolved.  No other neuropathy symptoms.  He feels well at present.  Objective:  Vital signs in last 24 hours:  Blood pressure 135/86, pulse 72, temperature 97.9 F (36.6 C), temperature source Oral, resp. rate 18, height 5\' 8"  (1.727 m), weight 150 lb (68 kg), SpO2 100 %.    HEENT: No thrush or ulcers Resp: Lungs clear bilaterally Cardio: Regular rate and rhythm GI: No hepatosplenomegaly Vascular: No leg edema  Skin: Palms without erythema  Portacath/PICC-without erythema  Lab Results:  Lab Results  Component Value Date   WBC 4.5 02/24/2021   HGB 9.2 (L) 02/24/2021   HCT 27.5 (L) 02/24/2021   MCV 90.5 02/24/2021   PLT 176 02/24/2021   NEUTROABS 1.1 (L) 02/24/2021    CMP  Lab Results  Component Value Date   NA 141 02/09/2021   K 3.5 02/09/2021   CL 109 02/09/2021   CO2 24 02/09/2021   GLUCOSE 296 (H) 02/09/2021   BUN 11 02/09/2021   CREATININE 0.86 02/09/2021   CALCIUM 8.9 02/09/2021   PROT 6.2 (L) 02/09/2021   ALBUMIN 3.8 02/09/2021   AST 43 (H) 02/09/2021   ALT 31 02/09/2021   ALKPHOS 121 02/09/2021   BILITOT 0.4 02/09/2021   GFRNONAA >60 02/09/2021    Lab Results  Component Value Date   ZCH885 486 (H) 02/09/2021    Medications: I have reviewed the patient's current medications.   Assessment/Plan: Pancreas cancer Outside CT-apparent pancreas abnormality (we do not have a copy of the report) MRI abdomen 11/06/2020-pancreatic body and tail atrophy with duct dilatation up to 9 mm.  Both ducts undergo an abrupt transition in the  region of the pancreatic head.  Non border deforming pancreatic head mass measuring 3.2 x 2.6 cm.  No arterial involvement by tumor.  SMV adjacent to presumed tumor without encasement.  Portal vein uninvolved.  No abdominal adenopathy.  Left periaortic 7 mm node not pathologic by size criteria.  No ascites.  Subtle left-sided pericolonic nodule 5 mm. EUS/ERCP 11/11/2020-EGD showed gastritis which was biopsied, mucosal changes in the duodenum, mucosal changes in the duodenum sweep (gastric antral and oxyntic mucosa with no specific histopathologic changes; Warthin Starry stain negative for H pylori).  EUS showed an irregular mass in the pancreatic head measuring 26 mm x 30 mm; the outer margins were irregular.  There was sonographic evidence suggesting invasion into the portal vein (manifested by abutment) and the superior mesenteric vein (manifested by abutment); an intact interface was seen between the mass and the superior mesenteric artery and celiac trunk suggesting a lack of invasion.  Endosonographic imaging in the visualized portion of the liver showed no mass.  There was dilatation of the common bile duct and the common hepatic duct.  No malignant appearing lymph nodes were visualized in the celiac region, peripancreatic region, porta hepatis region.  FNA biopsy of the pancreas head mass showed malignant cells consistent with adenocarcinoma.  Biliary access could not be obtained.  Cholangiogram 11/12/2020-severe intrahepatic biliary dilatation.  A peripheral right hepatic bile duct was successfully cannulated.  Internal/external biliary drain placed.  Cytology on biliary drain bile showed malignant cells consistent with adenocarcinoma.   CT chest 11/13/2020-no evidence of metastatic disease.   CT pelvis 11/13/2020-nonspecific circumferential wall thickening of the cecum measuring up to 12 mm in thickness.  Otherwise no evidence of intrapelvic metastases.   11/19/2020-biliary stent placement, biliary drain  exchange 11/28/2020-exchange of internal/external biliary drain secondary to persistent jaundice CT abdomen/pelvis 11/28/2020-pancreas head mass, 3.5 x 2.8 cm, no encasement of the celiac trunk or SMA, partial encasement of the portal venous confluence-at least 180 degrees, partial encasement of the SMV-approximately 180 degrees, ventral extension of tumor abuts the posterior duodenum, no adenopathy, percutaneous biliary stent with decompression of previous dilated intrahepatic and common bile ducts Cycle 1 FOLFIRINOX 01/01/2021, irinotecan held Cycle 2 FOLFIRINOX 01/13/2021, irinotecan held Cycle 3 FOLFIRINOX 01/28/2021 Cycle 4 FOLFIRINOX 02/09/2021   Painless jaundice-internal/external biliary drain placed 11/12/2020.  Biliary stent placed and drain exchange 11/19/2020, exchange of internal/external drain 11/28/2020 Weight loss Diabetes diagnosed June 2022 Change in bowel habits February 2022 Colonoscopy 08/06/2020-8 mm polyp in the sigmoid colon, a few small and large mouth diverticula in the sigmoid colon, normal mucosa found in the entire colon with biopsies for histology taken with a cold forceps from the right colon and left colon for evaluation of microscopic colitis (right colon-colonic mucosa with no significant pathologic findings; left colon-colonic mucosa with no significant pathologic findings; sigmoid polyp-tubular adenoma, negative for high-grade dysplasia). Hypertension Admission 12/12/2020 with acute cholecystitis, cholecystostomy tube 12/17/2020    Disposition: Francisco Helseth appears stable.  Has completed 4 cycles of FOLFIRINOX.  He had an episode of nausea/vomiting and diarrhea 1 week following cycle 4.  He has mild neutropenia today.  The plan is to proceed with cycle 5 chemotherapy if G-CSF is improved by his insurance company today.  If G-CSF is not approved chemotherapy will be delayed for 1 week.  He will be referred for a restaging pancreas protocol CT after cycle 6 FOLFIRINOX.  Betsy Coder, MD  02/24/2021  8:36 AM

## 2021-02-26 ENCOUNTER — Inpatient Hospital Stay: Payer: Medicare Other

## 2021-03-03 ENCOUNTER — Inpatient Hospital Stay: Payer: Medicare Other

## 2021-03-03 ENCOUNTER — Inpatient Hospital Stay (HOSPITAL_BASED_OUTPATIENT_CLINIC_OR_DEPARTMENT_OTHER): Payer: Medicare Other | Admitting: Nurse Practitioner

## 2021-03-03 ENCOUNTER — Other Ambulatory Visit: Payer: Self-pay

## 2021-03-03 ENCOUNTER — Encounter: Payer: Self-pay | Admitting: Nurse Practitioner

## 2021-03-03 VITALS — BP 113/77 | HR 59 | Temp 97.8°F | Resp 18 | Ht 68.0 in | Wt 147.0 lb

## 2021-03-03 DIAGNOSIS — C250 Malignant neoplasm of head of pancreas: Secondary | ICD-10-CM

## 2021-03-03 LAB — CMP (CANCER CENTER ONLY)
ALT: 34 U/L (ref 0–44)
AST: 34 U/L (ref 15–41)
Albumin: 3.6 g/dL (ref 3.5–5.0)
Alkaline Phosphatase: 116 U/L (ref 38–126)
Anion gap: 7 (ref 5–15)
BUN: 13 mg/dL (ref 8–23)
CO2: 26 mmol/L (ref 22–32)
Calcium: 8.8 mg/dL — ABNORMAL LOW (ref 8.9–10.3)
Chloride: 102 mmol/L (ref 98–111)
Creatinine: 0.93 mg/dL (ref 0.61–1.24)
GFR, Estimated: >60 mL/min (ref >60)
Glucose, Bld: 196 mg/dL — ABNORMAL HIGH (ref 70–99)
Potassium: 4.4 mmol/L (ref 3.5–5.1)
Sodium: 135 mmol/L (ref 135–145)
Total Bilirubin: 0.3 mg/dL (ref 0.3–1.2)
Total Protein: 6.2 g/dL — ABNORMAL LOW (ref 6.5–8.1)

## 2021-03-03 LAB — CBC WITH DIFFERENTIAL (CANCER CENTER ONLY)
Abs Immature Granulocytes: 0.03 10*3/uL (ref 0.00–0.07)
Basophils Absolute: 0.2 10*3/uL — ABNORMAL HIGH (ref 0.0–0.1)
Basophils Relative: 3 %
Eosinophils Absolute: 0.5 10*3/uL (ref 0.0–0.5)
Eosinophils Relative: 8 %
HCT: 32.7 % — ABNORMAL LOW (ref 39.0–52.0)
Hemoglobin: 10.6 g/dL — ABNORMAL LOW (ref 13.0–17.0)
Immature Granulocytes: 1 %
Lymphocytes Relative: 38 %
Lymphs Abs: 2.5 10*3/uL (ref 0.7–4.0)
MCH: 29.9 pg (ref 26.0–34.0)
MCHC: 32.4 g/dL (ref 30.0–36.0)
MCV: 92.1 fL (ref 80.0–100.0)
Monocytes Absolute: 0.8 10*3/uL (ref 0.1–1.0)
Monocytes Relative: 12 %
Neutro Abs: 2.4 10*3/uL (ref 1.7–7.7)
Neutrophils Relative %: 38 %
Platelet Count: 274 10*3/uL (ref 150–400)
RBC: 3.55 MIL/uL — ABNORMAL LOW (ref 4.22–5.81)
RDW: 15.4 % (ref 11.5–15.5)
WBC Count: 6.4 10*3/uL (ref 4.0–10.5)
nRBC: 0 % (ref 0.0–0.2)

## 2021-03-03 LAB — MAGNESIUM: Magnesium: 1.9 mg/dL (ref 1.7–2.4)

## 2021-03-03 MED ORDER — HYOSCYAMINE SULFATE 0.125 MG PO TABS: 0.1250 mg | ORAL_TABLET | Freq: Four times a day (QID) | ORAL | 0 refills | Status: DC | PRN

## 2021-03-03 MED ORDER — ATROPINE SULFATE 1 MG/ML IV SOLN
0.5000 mg | Freq: Once | INTRAVENOUS | Status: AC | PRN
  Administered 2021-03-03: 0.5 mg via INTRAVENOUS
  Filled 2021-03-03: qty 1

## 2021-03-03 MED ORDER — PALONOSETRON HCL INJECTION 0.25 MG/5ML
0.2500 mg | Freq: Once | INTRAVENOUS | Status: AC
  Administered 2021-03-03: 0.25 mg via INTRAVENOUS
  Filled 2021-03-03: qty 5

## 2021-03-03 MED ORDER — OXALIPLATIN CHEMO INJECTION 100 MG/20ML
85.0000 mg/m2 | Freq: Once | INTRAVENOUS | Status: AC
  Administered 2021-03-03: 150 mg via INTRAVENOUS
  Filled 2021-03-03: qty 20

## 2021-03-03 MED ORDER — DEXTROSE 5 % IV SOLN: Freq: Once | INTRAVENOUS | Status: AC

## 2021-03-03 MED ORDER — SODIUM CHLORIDE 0.9 % IV SOLN
150.0000 mg/m2 | Freq: Once | INTRAVENOUS | Status: AC
  Administered 2021-03-03: 260 mg via INTRAVENOUS
  Filled 2021-03-03: qty 13

## 2021-03-03 MED ORDER — SODIUM CHLORIDE 0.9 % IV SOLN
400.0000 mg/m2 | Freq: Once | INTRAVENOUS | Status: AC
  Administered 2021-03-03: 708 mg via INTRAVENOUS
  Filled 2021-03-03: qty 35.4

## 2021-03-03 MED ORDER — SODIUM CHLORIDE 0.9 % IV SOLN
2400.0000 mg/m2 | INTRAVENOUS | Status: DC
  Administered 2021-03-03: 4250 mg via INTRAVENOUS
  Filled 2021-03-03: qty 85

## 2021-03-03 MED ORDER — SODIUM CHLORIDE 0.9 % IV SOLN
10.0000 mg | Freq: Once | INTRAVENOUS | Status: AC
  Administered 2021-03-03: 10 mg via INTRAVENOUS
  Filled 2021-03-03: qty 1

## 2021-03-03 MED ORDER — SODIUM CHLORIDE 0.9 % IV SOLN
150.0000 mg | Freq: Once | INTRAVENOUS | Status: AC
  Administered 2021-03-03: 150 mg via INTRAVENOUS
  Filled 2021-03-03: qty 5

## 2021-03-03 NOTE — Progress Notes (Signed)
Patient presents for treatment. RN assessment completed along with the following:  Labs/vitals reviewed - Yes, and Per Lattie Haw, NP: okay to proceed with CBC form today and CMP from 12/06    Weight within 10% of previous measurement - Yes Oncology Treatment Attestation completed for current therapy- Yes, on date 12/10/2020 Informed consent completed and reflects current therapy/intent - Yes, on date 01/01/2021             Provider progress note reviewed - Yes, today's provider note was reviewed. Treatment/Antibody/Supportive plan reviewed - Yes, and there are no adjustments needed for today's treatment. S&H and other orders reviewed - Yes, and there are no additional orders identified. Previous treatment date reviewed - Yes, and the appropriate amount of time has elapsed between treatments. Clinic Hand Off Received from - Yes from Lattie Haw, NP  Patient to proceed with treatment.

## 2021-03-03 NOTE — Progress Notes (Signed)
Patient seen by Lisa Thomas NP today  Vitals are within treatment parameters.  Labs reviewed by Lisa Thomas NP and are within treatment parameters.  Per physician team, patient is ready for treatment and there are NO modifications to the treatment plan.     

## 2021-03-03 NOTE — Progress Notes (Signed)
[place] OFFICE PROGRESS NOTE   Diagnosis: Pancreas cancer  INTERVAL HISTORY:   Francisco Frank returns as scheduled.  He completed a cycle of FOLFIRINOX 02/09/2021.  Cycle 5 was held last week due to mild neutropenia.  He had mild cold sensitivity for about 2 weeks after the last chemotherapy.  No persistent neuropathy symptoms.  No nausea, vomiting, diarrhea.  Objective:  Vital signs in last 24 hours:  Blood pressure 113/77, pulse (!) 59, temperature 97.8 F (36.6 C), temperature source Oral, resp. rate 18, height 5\' 8"  (1.727 m), weight 147 lb (66.7 kg), SpO2 100 %.    HEENT: No thrush or ulcers. Resp: Lungs clear bilaterally. Cardio: Regular rate and rhythm. GI: Abdomen soft and nontender.  No hepatomegaly. Vascular: No leg edema. Neuro: Vibratory sense intact over the fingertips per tuning fork exam. Skin: Palms without erythema. Port-A-Cath without erythema.  Lab Results:  Lab Results  Component Value Date   WBC 4.5 02/24/2021   HGB 9.2 (L) 02/24/2021   HCT 27.5 (L) 02/24/2021   MCV 90.5 02/24/2021   PLT 176 02/24/2021   NEUTROABS 1.1 (L) 02/24/2021    Imaging:  No results found.  Medications: I have reviewed the patient's current medications.  Assessment/Plan: Pancreas cancer Outside CT-apparent pancreas abnormality (we do not have a copy of the report) MRI abdomen 11/06/2020-pancreatic body and tail atrophy with duct dilatation up to 9 mm.  Both ducts undergo an abrupt transition in the region of the pancreatic head.  Non border deforming pancreatic head mass measuring 3.2 x 2.6 cm.  No arterial involvement by tumor.  SMV adjacent to presumed tumor without encasement.  Portal vein uninvolved.  No abdominal adenopathy.  Left periaortic 7 mm node not pathologic by size criteria.  No ascites.  Subtle left-sided pericolonic nodule 5 mm. EUS/ERCP 11/11/2020-EGD showed gastritis which was biopsied, mucosal changes in the duodenum, mucosal changes in the  duodenum sweep (gastric antral and oxyntic mucosa with no specific histopathologic changes; Warthin Starry stain negative for H pylori).  EUS showed an irregular mass in the pancreatic head measuring 26 mm x 30 mm; the outer margins were irregular.  There was sonographic evidence suggesting invasion into the portal vein (manifested by abutment) and the superior mesenteric vein (manifested by abutment); an intact interface was seen between the mass and the superior mesenteric artery and celiac trunk suggesting a lack of invasion.  Endosonographic imaging in the visualized portion of the liver showed no mass.  There was dilatation of the common bile duct and the common hepatic duct.  No malignant appearing lymph nodes were visualized in the celiac region, peripancreatic region, porta hepatis region.  FNA biopsy of the pancreas head mass showed malignant cells consistent with adenocarcinoma.  Biliary access could not be obtained.  Cholangiogram 11/12/2020-severe intrahepatic biliary dilatation.  A peripheral right hepatic bile duct was successfully cannulated.  Internal/external biliary drain placed.  Cytology on biliary drain bile showed malignant cells consistent with adenocarcinoma.   CT chest 11/13/2020-no evidence of metastatic disease.   CT pelvis 11/13/2020-nonspecific circumferential wall thickening of the cecum measuring up to 12 mm in thickness.  Otherwise no evidence of intrapelvic metastases.   11/19/2020-biliary stent placement, biliary drain exchange 11/28/2020-exchange of internal/external biliary drain secondary to persistent jaundice CT abdomen/pelvis 11/28/2020-pancreas head mass, 3.5 x 2.8 cm, no encasement of the celiac trunk or SMA, partial encasement of the portal venous confluence-at least 180 degrees, partial encasement of the SMV-approximately 180 degrees, ventral extension of tumor abuts  the posterior duodenum, no adenopathy, percutaneous biliary stent with decompression of previous dilated  intrahepatic and common bile ducts Cycle 1 FOLFIRINOX 01/01/2021, irinotecan held Cycle 2 FOLFIRINOX 01/13/2021, irinotecan held Cycle 3 FOLFIRINOX 01/28/2021 Cycle 4 FOLFIRINOX 02/09/2021 Cycle 5 FOLFIRINOX 03/03/2021, Ziextenzo   Painless jaundice-internal/external biliary drain placed 11/12/2020.  Biliary stent placed and drain exchange 11/19/2020, exchange of internal/external drain 11/28/2020 Weight loss Diabetes diagnosed June 2022 Change in bowel habits February 2022 Colonoscopy 08/06/2020-8 mm polyp in the sigmoid colon, a few small and large mouth diverticula in the sigmoid colon, normal mucosa found in the entire colon with biopsies for histology taken with a cold forceps from the right colon and left colon for evaluation of microscopic colitis (right colon-colonic mucosa with no significant pathologic findings; left colon-colonic mucosa with no significant pathologic findings; sigmoid polyp-tubular adenoma, negative for high-grade dysplasia). Hypertension Admission 12/12/2020 with acute cholecystitis, cholecystostomy tube 12/17/2020  Disposition: Francisco Frank appears stable.  He has completed 4 cycles of FOLFIRINOX.  Cycle 5 was held last week due to mild neutropenia.  CBC reviewed, counts adequate to proceed with treatment.  He will receive Ziextenzo on the day of pump discontinuation.  Potential toxicities associate with white cell growth factor support again reviewed.  He agrees with this plan.  He will return for lab, follow-up, cycle 6 FOLFIRINOX 03/25/2020.  We are available to see him sooner if needed.    Ned Card ANP/GNP-BC   03/03/2021  8:49 AM

## 2021-03-03 NOTE — Patient Instructions (Signed)
[facility]   Discharge Instructions: Thank you for choosing [facility] to provide your oncology and hematology care.   If you have a lab appointment with the [facility], please go directly to the [facility] and check in at the registration area.   Wear comfortable clothing and clothing appropriate for easy access to any Portacath or PICC line.   We strive to give you quality time with your provider. You may need to reschedule your appointment if you arrive late (15 or more minutes).  Arriving late affects you and other patients whose appointments are after yours.  Also, if you miss three or more appointments without notifying the office, you may be dismissed from the clinic at the providers discretion.      For prescription refill requests, have your pharmacy contact our office and allow 72 hours for refills to be completed.    Today you received the following chemotherapy and/or immunotherapy agents Oxaliplatin (ELOXATIN), Irinotecan (CAMPTOSAR), Leucovorin & Flourouracil (ADRUCIL)      To help prevent nausea and vomiting after your treatment, we encourage you to take your nausea medication as directed.  BELOW ARE SYMPTOMS THAT SHOULD BE REPORTED IMMEDIATELY: *FEVER GREATER THAN 100.4 F (38 C) OR HIGHER *CHILLS OR SWEATING *NAUSEA AND VOMITING THAT IS NOT CONTROLLED WITH YOUR NAUSEA MEDICATION *UNUSUAL SHORTNESS OF BREATH *UNUSUAL BRUISING OR BLEEDING *URINARY PROBLEMS (pain or burning when urinating, or frequent urination) *BOWEL PROBLEMS (unusual diarrhea, constipation, pain near the anus) TENDERNESS IN MOUTH AND THROAT WITH OR WITHOUT PRESENCE OF ULCERS (sore throat, sores in mouth, or a toothache) UNUSUAL RASH, SWELLING OR PAIN  UNUSUAL VAGINAL DISCHARGE OR ITCHING   Items with * indicate a potential emergency and should be followed up as soon as possible or go to the Emergency Department if any problems should occur.  Please show  the CHEMOTHERAPY ALERT CARD or IMMUNOTHERAPY ALERT CARD at check-in to the Emergency Department and triage nurse.  Should you have questions after your visit or need to cancel or reschedule your appointment, please contact [facility]  Dept: [phone]  and follow the prompts.  Office hours are 8:00 a.m. to 4:30 p.m. Monday - Friday. Please note that voicemails left after 4:00 p.m. may not be returned until the following business day.  We are closed weekends and major holidays. You have access to a nurse at all times for urgent questions. Please call the main number to the clinic Dept: [phone] and follow the prompts.   For any non-urgent questions, you may also contact your provider using MyChart. We now offer e-Visits for anyone 19 and older to request care online for non-urgent symptoms. For details visit [email or web address].   Also download the MyChart app! Go to the app store, search "MyChart", open the app, select [facility], and log in with your MyChart username and password.  Due to Covid, a mask is required upon entering the hospital/clinic. If you do not have a mask, one will be given to you upon arrival. For doctor visits, patients may have 1 support person aged 36 or older with them. For treatment visits, patients cannot have anyone with them due to current Covid guidelines and our immunocompromised population.   Oxaliplatin Injection What is this medication? OXALIPLATIN (ox AL i PLA tin) is a chemotherapy drug. It targets fast dividing cells, like cancer cells, and causes these cells to die. This medicine is used to treat cancers of the colon and  rectum, and many other cancers. This medicine may be used for other purposes; ask your health care provider or pharmacist if you have questions. COMMON BRAND NAME(S): Eloxatin What should I tell my care team before I take this medication? They need to know if you have any of these conditions: heart  disease history of irregular heartbeat liver disease low blood counts, like white cells, platelets, or red blood cells lung or breathing disease, like asthma take medicines that treat or prevent blood clots tingling of the fingers or toes, or other nerve disorder an unusual or allergic reaction to oxaliplatin, other chemotherapy, other medicines, foods, dyes, or preservatives pregnant or trying to get pregnant breast-feeding How should I use this medication? This drug is given as an infusion into a vein. It is administered in a hospital or clinic by a specially trained health care professional. Talk to your pediatrician regarding the use of this medicine in children. Special care may be needed. Overdosage: If you think you have taken too much of this medicine contact a poison control center or emergency room at once. NOTE: This medicine is only for you. Do not share this medicine with others. What if I miss a dose? It is important not to miss a dose. Call your doctor or health care professional if you are unable to keep an appointment. What may interact with this medication? Do not take this medicine with any of the following medications: cisapride dronedarone pimozide thioridazine This medicine may also interact with the following medications: aspirin and aspirin-like medicines certain medicines that treat or prevent blood clots like warfarin, apixaban, dabigatran, and rivaroxaban cisplatin cyclosporine diuretics medicines for infection like acyclovir, adefovir, amphotericin B, bacitracin, cidofovir, foscarnet, ganciclovir, gentamicin, pentamidine, vancomycin NSAIDs, medicines for pain and inflammation, like ibuprofen or naproxen other medicines that prolong the QT interval (an abnormal heart rhythm) pamidronate zoledronic acid This list may not describe all possible interactions. Give your health care provider a list of all the medicines, herbs, non-prescription drugs, or dietary  supplements you use. Also tell them if you smoke, drink alcohol, or use illegal drugs. Some items may interact with your medicine. What should I watch for while using this medication? Your condition will be monitored carefully while you are receiving this medicine. You may need blood work done while you are taking this medicine. This medicine may make you feel generally unwell. This is not uncommon as chemotherapy can affect healthy cells as well as cancer cells. Report any side effects. Continue your course of treatment even though you feel ill unless your healthcare professional tells you to stop. This medicine can make you more sensitive to cold. Do not drink cold drinks or use ice. Cover exposed skin before coming in contact with cold temperatures or cold objects. When out in cold weather wear warm clothing and cover your mouth and nose to warm the air that goes into your lungs. Tell your doctor if you get sensitive to the cold. Do not become pregnant while taking this medicine or for 9 months after stopping it. Women should inform their health care professional if they wish to become pregnant or think they might be pregnant. Men should not father a child while taking this medicine and for 6 months after stopping it. There is potential for serious side effects to an unborn child. Talk to your health care professional for more information. Do not breast-feed a child while taking this medicine or for 3 months after stopping it. This medicine has caused ovarian  failure in some women. This medicine may make it more difficult to get pregnant. Talk to your health care professional if you are concerned about your fertility. This medicine has caused decreased sperm counts in some men. This may make it more difficult to father a child. Talk to your health care professional if you are concerned about your fertility. This medicine may increase your risk of getting an infection. Call your health care professional  for advice if you get a fever, chills, or sore throat, or other symptoms of a cold or flu. Do not treat yourself. Try to avoid being around people who are sick. Avoid taking medicines that contain aspirin, acetaminophen, ibuprofen, naproxen, or ketoprofen unless instructed by your health care professional. These medicines may hide a fever. Be careful brushing or flossing your teeth or using a toothpick because you may get an infection or bleed more easily. If you have any dental work done, tell your dentist you are receiving this medicine. What side effects may I notice from receiving this medication? Side effects that you should report to your doctor or health care professional as soon as possible: allergic reactions like skin rash, itching or hives, swelling of the face, lips, or tongue breathing problems cough low blood counts - this medicine may decrease the number of white blood cells, red blood cells, and platelets. You may be at increased risk for infections and bleeding nausea, vomiting pain, redness, or irritation at site where injected pain, tingling, numbness in the hands or feet signs and symptoms of bleeding such as bloody or black, tarry stools; red or dark brown urine; spitting up blood or brown material that looks like coffee grounds; red spots on the skin; unusual bruising or bleeding from the eyes, gums, or nose signs and symptoms of a dangerous change in heartbeat or heart rhythm like chest pain; dizziness; fast, irregular heartbeat; palpitations; feeling faint or lightheaded; falls signs and symptoms of infection like fever; chills; cough; sore throat; pain or trouble passing urine signs and symptoms of liver injury like dark yellow or brown urine; general ill feeling or flu-like symptoms; light-colored stools; loss of appetite; nausea; right upper belly pain; unusually weak or tired; yellowing of the eyes or skin signs and symptoms of low red blood cells or anemia such as  unusually weak or tired; feeling faint or lightheaded; falls signs and symptoms of muscle injury like dark urine; trouble passing urine or change in the amount of urine; unusually weak or tired; muscle pain; back pain Side effects that usually do not require medical attention (report to your doctor or health care professional if they continue or are bothersome): changes in taste diarrhea gas hair loss loss of appetite mouth sores This list may not describe all possible side effects. Call your doctor for medical advice about side effects. You may report side effects to FDA at 1-800-FDA-1088. Where should I keep my medication? This drug is given in a hospital or clinic and will not be stored at home. NOTE: This sheet is a summary. It may not cover all possible information. If you have questions about this medicine, talk to your doctor, pharmacist, or health care provider.  2022 Elsevier/Gold Standard (2020-11-25 00:00:00)  Irinotecan injection What is this medication? IRINOTECAN (ir in oh TEE kan ) is a chemotherapy drug. It is used to treat colon and rectal cancer. This medicine may be used for other purposes; ask your health care provider or pharmacist if you have questions. COMMON BRAND NAME(S): Camptosar  What should I tell my care team before I take this medication? They need to know if you have any of these conditions: dehydration diarrhea infection (especially a virus infection such as chickenpox, cold sores, or herpes) liver disease low blood counts, like low white cell, platelet, or red cell counts low levels of calcium, magnesium, or potassium in the blood recent or ongoing radiation therapy an unusual or allergic reaction to irinotecan, other medicines, foods, dyes, or preservatives pregnant or trying to get pregnant breast-feeding How should I use this medication? This drug is given as an infusion into a vein. It is administered in a hospital or clinic by a specially  trained health care professional. Talk to your pediatrician regarding the use of this medicine in children. Special care may be needed. Overdosage: If you think you have taken too much of this medicine contact a poison control center or emergency room at once. NOTE: This medicine is only for you. Do not share this medicine with others. What if I miss a dose? It is important not to miss your dose. Call your doctor or health care professional if you are unable to keep an appointment. What may interact with this medication? Do not take this medicine with any of the following medications: cobicistat itraconazole This medicine may interact with the following medications: antiviral medicines for HIV or AIDS certain antibiotics like rifampin or rifabutin certain medicines for fungal infections like ketoconazole, posaconazole, and voriconazole certain medicines for seizures like carbamazepine, phenobarbital, phenotoin clarithromycin gemfibrozil nefazodone [facility]. John's Wort This list may not describe all possible interactions. Give your health care provider a list of all the medicines, herbs, non-prescription drugs, or dietary supplements you use. Also tell them if you smoke, drink alcohol, or use illegal drugs. Some items may interact with your medicine. What should I watch for while using this medication? Your condition will be monitored carefully while you are receiving this medicine. You will need important blood work done while you are taking this medicine. This drug may make you feel generally unwell. This is not uncommon, as chemotherapy can affect healthy cells as well as cancer cells. Report any side effects. Continue your course of treatment even though you feel ill unless your doctor tells you to stop. In some cases, you may be given additional medicines to help with side effects. Follow all directions for their use. You may get drowsy or dizzy. Do not drive, use machinery, or do anything  that needs mental alertness until you know how this medicine affects you. Do not stand or sit up quickly, especially if you are an older patient. This reduces the risk of dizzy or fainting spells. Call your health care professional for advice if you get a fever, chills, or sore throat, or other symptoms of a cold or flu. Do not treat yourself. This medicine decreases your body's ability to fight infections. Try to avoid being around people who are sick. Avoid taking products that contain aspirin, acetaminophen, ibuprofen, naproxen, or ketoprofen unless instructed by your doctor. These medicines may hide a fever. This medicine may increase your risk to bruise or bleed. Call your doctor or health care professional if you notice any unusual bleeding. Be careful brushing and flossing your teeth or using a toothpick because you may get an infection or bleed more easily. If you have any dental work done, tell your dentist you are receiving this medicine. Do not become pregnant while taking this medicine or for 6 months after stopping it. Women  should inform their health care professional if they wish to become pregnant or think they might be pregnant. Men should not father a child while taking this medicine and for 3 months after stopping it. There is potential for serious side effects to an unborn child. Talk to your health care professional for more information. Do not breast-feed an infant while taking this medicine or for 7 days after stopping it. This medicine has caused ovarian failure in some women. This medicine may make it more difficult to get pregnant. Talk to your health care professional if you are concerned about your fertility. This medicine has caused decreased sperm counts in some men. This may make it more difficult to father a child. Talk to your health care professional if you are concerned about your fertility. What side effects may I notice from receiving this medication? Side effects that  you should report to your doctor or health care professional as soon as possible: allergic reactions like skin rash, itching or hives, swelling of the face, lips, or tongue chest pain diarrhea flushing, runny nose, sweating during infusion low blood counts - this medicine may decrease the number of white blood cells, red blood cells and platelets. You may be at increased risk for infections and bleeding. nausea, vomiting pain, swelling, warmth in the leg signs of decreased platelets or bleeding - bruising, pinpoint red spots on the skin, black, tarry stools, blood in the urine signs of infection - fever or chills, cough, sore throat, pain or difficulty passing urine signs of decreased red blood cells - unusually weak or tired, fainting spells, lightheadedness Side effects that usually do not require medical attention (report to your doctor or health care professional if they continue or are bothersome): constipation hair loss headache loss of appetite mouth sores stomach pain This list may not describe all possible side effects. Call your doctor for medical advice about side effects. You may report side effects to FDA at 1-800-FDA-1088. Where should I keep my medication? This drug is given in a hospital or clinic and will not be stored at home. NOTE: This sheet is a summary. It may not cover all possible information. If you have questions about this medicine, talk to your doctor, pharmacist, or health care provider.  2022 Elsevier/Gold Standard (2020-11-25 00:00:00)  Leucovorin injection What is this medication? LEUCOVORIN (loo koe VOR in) is used to prevent or treat the harmful effects of some medicines. This medicine is used to treat anemia caused by a low amount of folic acid in the body. It is also used with 5-fluorouracil (5-FU) to treat colon cancer. This medicine may be used for other purposes; ask your health care provider or pharmacist if you have questions. What should I tell  my care team before I take this medication? They need to know if you have any of these conditions: anemia from low levels of vitamin B-12 in the blood an unusual or allergic reaction to leucovorin, folic acid, other medicines, foods, dyes, or preservatives pregnant or trying to get pregnant breast-feeding How should I use this medication? This medicine is for injection into a muscle or into a vein. It is given by a health care professional in a hospital or clinic setting. Talk to your pediatrician regarding the use of this medicine in children. Special care may be needed. Overdosage: If you think you have taken too much of this medicine contact a poison control center or emergency room at once. NOTE: This medicine is only for you. Do  not share this medicine with others. What if I miss a dose? This does not apply. What may interact with this medication? capecitabine fluorouracil phenobarbital phenytoin primidone trimethoprim-sulfamethoxazole This list may not describe all possible interactions. Give your health care provider a list of all the medicines, herbs, non-prescription drugs, or dietary supplements you use. Also tell them if you smoke, drink alcohol, or use illegal drugs. Some items may interact with your medicine. What should I watch for while using this medication? Your condition will be monitored carefully while you are receiving this medicine. This medicine may increase the side effects of 5-fluorouracil, 5-FU. Tell your doctor or health care professional if you have diarrhea or mouth sores that do not get better or that get worse. What side effects may I notice from receiving this medication? Side effects that you should report to your doctor or health care professional as soon as possible: allergic reactions like skin rash, itching or hives, swelling of the face, lips, or tongue breathing problems fever, infection mouth sores unusual bleeding or bruising unusually weak or  tired Side effects that usually do not require medical attention (report to your doctor or health care professional if they continue or are bothersome): constipation or diarrhea loss of appetite nausea, vomiting This list may not describe all possible side effects. Call your doctor for medical advice about side effects. You may report side effects to FDA at 1-800-FDA-1088. Where should I keep my medication? This drug is given in a hospital or clinic and will not be stored at home. NOTE: This sheet is a summary. It may not cover all possible information. If you have questions about this medicine, talk to your doctor, pharmacist, or health care provider.  2022 Elsevier/Gold Standard (2007-09-14 00:00:00)  Fluorouracil, 5-FU injection What is this medication? FLUOROURACIL, 5-FU (flure oh YOOR a sil) is a chemotherapy drug. It slows the growth of cancer cells. This medicine is used to treat many types of cancer like breast cancer, colon or rectal cancer, pancreatic cancer, and stomach cancer. This medicine may be used for other purposes; ask your health care provider or pharmacist if you have questions. COMMON BRAND NAME(S): Adrucil What should I tell my care team before I take this medication? They need to know if you have any of these conditions: blood disorders dihydropyrimidine dehydrogenase (DPD) deficiency infection (especially a virus infection such as chickenpox, cold sores, or herpes) kidney disease liver disease malnourished, poor nutrition recent or ongoing radiation therapy an unusual or allergic reaction to fluorouracil, other chemotherapy, other medicines, foods, dyes, or preservatives pregnant or trying to get pregnant breast-feeding How should I use this medication? This drug is given as an infusion or injection into a vein. It is administered in a hospital or clinic by a specially trained health care professional. Talk to your pediatrician regarding the use of this  medicine in children. Special care may be needed. Overdosage: If you think you have taken too much of this medicine contact a poison control center or emergency room at once. NOTE: This medicine is only for you. Do not share this medicine with others. What if I miss a dose? It is important not to miss your dose. Call your doctor or health care professional if you are unable to keep an appointment. What may interact with this medication? Do not take this medicine with any of the following medications: live virus vaccines This medicine may also interact with the following medications: medicines that treat or prevent blood clots like warfarin,   enoxaparin, and dalteparin This list may not describe all possible interactions. Give your health care provider a list of all the medicines, herbs, non-prescription drugs, or dietary supplements you use. Also tell them if you smoke, drink alcohol, or use illegal drugs. Some items may interact with your medicine. What should I watch for while using this medication? Visit your doctor for checks on your progress. This drug may make you feel generally unwell. This is not uncommon, as chemotherapy can affect healthy cells as well as cancer cells. Report any side effects. Continue your course of treatment even though you feel ill unless your doctor tells you to stop. In some cases, you may be given additional medicines to help with side effects. Follow all directions for their use. Call your doctor or health care professional for advice if you get a fever, chills or sore throat, or other symptoms of a cold or flu. Do not treat yourself. This drug decreases your body's ability to fight infections. Try to avoid being around people who are sick. This medicine may increase your risk to bruise or bleed. Call your doctor or health care professional if you notice any unusual bleeding. Be careful brushing and flossing your teeth or using a toothpick because you may get an  infection or bleed more easily. If you have any dental work done, tell your dentist you are receiving this medicine. Avoid taking products that contain aspirin, acetaminophen, ibuprofen, naproxen, or ketoprofen unless instructed by your doctor. These medicines may hide a fever. Do not become pregnant while taking this medicine. Women should inform their doctor if they wish to become pregnant or think they might be pregnant. There is a potential for serious side effects to an unborn child. Talk to your health care professional or pharmacist for more information. Do not breast-feed an infant while taking this medicine. Men should inform their doctor if they wish to father a child. This medicine may lower sperm counts. Do not treat diarrhea with over the counter products. Contact your doctor if you have diarrhea that lasts more than 2 days or if it is severe and watery. This medicine can make you more sensitive to the sun. Keep out of the sun. If you cannot avoid being in the sun, wear protective clothing and use sunscreen. Do not use sun lamps or tanning beds/booths. What side effects may I notice from receiving this medication? Side effects that you should report to your doctor or health care professional as soon as possible: allergic reactions like skin rash, itching or hives, swelling of the face, lips, or tongue low blood counts - this medicine may decrease the number of white blood cells, red blood cells and platelets. You may be at increased risk for infections and bleeding. signs of infection - fever or chills, cough, sore throat, pain or difficulty passing urine signs of decreased platelets or bleeding - bruising, pinpoint red spots on the skin, black, tarry stools, blood in the urine signs of decreased red blood cells - unusually weak or tired, fainting spells, lightheadedness breathing problems changes in vision chest pain mouth sores nausea and vomiting pain, swelling, redness at site  where injected pain, tingling, numbness in the hands or feet redness, swelling, or sores on hands or feet stomach pain unusual bleeding Side effects that usually do not require medical attention (report to your doctor or health care professional if they continue or are bothersome): changes in finger or toe nails diarrhea dry or itchy skin hair loss headache loss   of appetite sensitivity of eyes to the light stomach upset unusually teary eyes This list may not describe all possible side effects. Call your doctor for medical advice about side effects. You may report side effects to FDA at 1-800-FDA-1088. Where should I keep my medication? This drug is given in a hospital or clinic and will not be stored at home. NOTE: This sheet is a summary. It may not cover all possible information. If you have questions about this medicine, talk to your doctor, pharmacist, or health care provider.  2022 Elsevier/Gold Standard (2020-11-25 00:00:00)  The chemotherapy medication bag should finish at 46 hours, 96 hours, or 7 days. For example, if your pump is scheduled for 46 hours and it was put on at 4:00 p.m., it should finish at 2:00 p.m. the day it is scheduled to come off regardless of your appointment time.     Estimated time to finish at 1:00 p.m. on Thursday 03/05/2021.   If the display on your pump reads "Low Volume" and it is beeping, take the batteries out of the pump and come to the cancer center for it to be taken off.   If the pump alarms go off prior to the pump reading "Low Volume" then call [phone] and someone can assist you.  If the plunger comes out and the chemotherapy medication is leaking out, please use your home chemo spill kit to clean up the spill. Do NOT use paper towels or other household products.  If you have problems or questions regarding your pump, please call either [phone] (24 hours a day) or the cancer center Monday-Friday 8:00 a.m.- 4:30 p.m. at the  clinic number and we will assist you. If you are unable to get assistance, then go to the nearest Emergency Department and ask the staff to contact the IV team for assistance.

## 2021-03-04 ENCOUNTER — Other Ambulatory Visit: Payer: Self-pay | Admitting: *Deleted

## 2021-03-04 DIAGNOSIS — C250 Malignant neoplasm of head of pancreas: Secondary | ICD-10-CM

## 2021-03-04 LAB — CANCER ANTIGEN 19-9: CA 19-9: 472 U/mL — ABNORMAL HIGH (ref 0–35)

## 2021-03-05 ENCOUNTER — Inpatient Hospital Stay: Payer: Medicare Other

## 2021-03-05 ENCOUNTER — Other Ambulatory Visit: Payer: Self-pay

## 2021-03-05 VITALS — BP 105/67 | HR 55 | Temp 99.0°F | Resp 18

## 2021-03-05 DIAGNOSIS — C250 Malignant neoplasm of head of pancreas: Secondary | ICD-10-CM

## 2021-03-05 MED ORDER — PEGFILGRASTIM-BMEZ 6 MG/0.6ML ~~LOC~~ SOSY
6.0000 mg | PREFILLED_SYRINGE | Freq: Once | SUBCUTANEOUS | Status: AC
  Administered 2021-03-05: 6 mg via SUBCUTANEOUS

## 2021-03-05 MED ORDER — SODIUM CHLORIDE 0.9% FLUSH
10.0000 mL | INTRAVENOUS | Status: DC | PRN
  Administered 2021-03-05: 10 mL

## 2021-03-05 MED ORDER — HEPARIN SOD (PORK) LOCK FLUSH 100 UNIT/ML IV SOLN
500.0000 [IU] | Freq: Once | INTRAVENOUS | Status: AC | PRN
  Administered 2021-03-05: 500 [IU]

## 2021-03-05 NOTE — Patient Instructions (Signed)
[facility]   Discharge Instructions: Thank you for choosing [facility] to provide your oncology and hematology care.   If you have a lab appointment with the [facility], please go directly to the [facility] and check in at the registration area.   Wear comfortable clothing and clothing appropriate for easy access to any Portacath or PICC line.   We strive to give you quality time with your provider. You may need to reschedule your appointment if you arrive late (15 or more minutes).  Arriving late affects you and other patients whose appointments are after yours.  Also, if you miss three or more appointments without notifying the office, you may be dismissed from the clinic at the providers discretion.      For prescription refill requests, have your pharmacy contact our office and allow 72 hours for refills to be completed.    Today you received the following Ziextenzo   To help prevent nausea and vomiting after your treatment, we encourage you to take your nausea medication as directed.  BELOW ARE SYMPTOMS THAT SHOULD BE REPORTED IMMEDIATELY: *FEVER GREATER THAN 100.4 F (38 C) OR HIGHER *CHILLS OR SWEATING *NAUSEA AND VOMITING THAT IS NOT CONTROLLED WITH YOUR NAUSEA MEDICATION *UNUSUAL SHORTNESS OF BREATH *UNUSUAL BRUISING OR BLEEDING *URINARY PROBLEMS (pain or burning when urinating, or frequent urination) *BOWEL PROBLEMS (unusual diarrhea, constipation, pain near the anus) TENDERNESS IN MOUTH AND THROAT WITH OR WITHOUT PRESENCE OF ULCERS (sore throat, sores in mouth, or a toothache) UNUSUAL RASH, SWELLING OR PAIN  UNUSUAL VAGINAL DISCHARGE OR ITCHING   Items with * indicate a potential emergency and should be followed up as soon as possible or go to the Emergency Department if any problems should occur.  Please show the CHEMOTHERAPY ALERT CARD or IMMUNOTHERAPY ALERT CARD at check-in to the Emergency Department and triage  nurse.  Should you have questions after your visit or need to cancel or reschedule your appointment, please contact [facility]  Dept: [phone]  and follow the prompts.  Office hours are 8:00 a.m. to 4:30 p.m. Monday - Friday. Please note that voicemails left after 4:00 p.m. may not be returned until the following business day.  We are closed weekends and major holidays. You have access to a nurse at all times for urgent questions. Please call the main number to the clinic Dept: [phone] and follow the prompts.   For any non-urgent questions, you may also contact your provider using MyChart. We now offer e-Visits for anyone 26 and older to request care online for non-urgent symptoms. For details visit [email or web address].   Also download the MyChart app! Go to the app store, search "MyChart", open the app, select [facility], and log in with your MyChart username and password.  Due to Covid, a mask is required upon entering the hospital/clinic. If you do not have a mask, one will be given to you upon arrival. For doctor visits, patients may have 1 support person aged 32 or older with them. For treatment visits, patients cannot have anyone with them due to current Covid guidelines and our immunocompromised population.   Pegfilgrastim Injection What is this medication? PEGFILGRASTIM (PEG fil gra stim) lowers the risk of infection in people who are receiving chemotherapy. It works by Building control surveyor make more white blood cells, which protects your body from infection. It may also be used to help people who have been exposed to high doses of  radiation. This medicine may be used for other purposes; ask your health care provider or pharmacist if you have questions. COMMON BRAND NAME(S): Rexene Edison, Ziextenzo What should I tell my care team before I take this medication? They need to know if you have any of these conditions: Kidney  disease Latex allergy Ongoing radiation therapy Sickle cell disease Skin reactions to acrylic adhesives (On-Body Injector only) An unusual or allergic reaction to pegfilgrastim, filgrastim, other medications, foods, dyes, or preservatives Pregnant or trying to get pregnant Breast-feeding How should I use this medication? This medication is for injection under the skin. If you get this medication at home, you will be taught how to prepare and give the pre-filled syringe or how to use the On-body Injector. Refer to the patient Instructions for Use for detailed instructions. Use exactly as directed. Tell your care team immediately if you suspect that the On-body Injector may not have performed as intended or if you suspect the use of the On-body Injector resulted in a missed or partial dose. It is important that you put your used needles and syringes in a special sharps container. Do not put them in a trash can. If you do not have a sharps container, call your pharmacist or care team to get one. Talk to your care team about the use of this medication in children. While this medication may be prescribed for selected conditions, precautions do apply. Overdosage: If you think you have taken too much of this medicine contact a poison control center or emergency room at once. NOTE: This medicine is only for you. Do not share this medicine with others. What if I miss a dose? It is important not to miss your dose. Call your care team if you miss your dose. If you miss a dose due to an On-body Injector failure or leakage, a new dose should be administered as soon as possible using a single prefilled syringe for manual use. What may interact with this medication? Interactions have not been studied. This list may not describe all possible interactions. Give your health care provider a list of all the medicines, herbs, non-prescription drugs, or dietary supplements you use. Also tell them if you smoke, drink  alcohol, or use illegal drugs. Some items may interact with your medicine. What should I watch for while using this medication? Your condition will be monitored carefully while you are receiving this medication. You may need blood work done while you are taking this medication. Talk to your care team about your risk of cancer. You may be more at risk for certain types of cancer if you take this medication. If you are going to need a MRI, CT scan, or other procedure, tell your care team that you are using this medication (On-Body Injector only). What side effects may I notice from receiving this medication? Side effects that you should report to your care team as soon as possible: Allergic reactions--skin rash, itching, hives, swelling of the face, lips, tongue, or throat Capillary leak syndrome--stomach or muscle pain, unusual weakness or fatigue, feeling faint or lightheaded, decrease in the amount of urine, swelling of the ankles, hands, or feet, trouble breathing High white blood cell level--fever, fatigue, trouble breathing, night sweats, change in vision, weight loss Inflammation of the aorta--fever, fatigue, back, chest, or stomach pain, severe headache Kidney injury (glomerulonephritis)--decrease in the amount of urine, red or dark brown urine, foamy or bubbly urine, swelling of the ankles, hands, or feet Shortness of breath or  trouble breathing Spleen injury--pain in upper left stomach or shoulder Unusual bruising or bleeding Side effects that usually do not require medical attention (report to your care team if they continue or are bothersome): Bone pain Pain in the hands or feet This list may not describe all possible side effects. Call your doctor for medical advice about side effects. You may report side effects to FDA at 1-800-FDA-1088. Where should I keep my medication? Keep out of the reach of children. If you are using this medication at home, you will be instructed on how to  store it. Throw away any unused medication after the expiration date on the label. NOTE: This sheet is a summary. It may not cover all possible information. If you have questions about this medicine, talk to your doctor, pharmacist, or health care provider.  2022 Elsevier/Gold Standard (2020-11-25 00:00:00)

## 2021-03-10 ENCOUNTER — Inpatient Hospital Stay: Payer: Medicare Other

## 2021-03-10 ENCOUNTER — Inpatient Hospital Stay: Payer: Medicare Other | Admitting: Oncology

## 2021-03-12 ENCOUNTER — Inpatient Hospital Stay: Payer: Medicare Other

## 2021-03-23 ENCOUNTER — Other Ambulatory Visit: Payer: Self-pay | Admitting: Oncology

## 2021-03-25 ENCOUNTER — Encounter: Payer: Self-pay | Admitting: Oncology

## 2021-03-25 ENCOUNTER — Other Ambulatory Visit: Payer: Self-pay

## 2021-03-25 ENCOUNTER — Inpatient Hospital Stay: Payer: Medicare Other | Attending: Nurse Practitioner

## 2021-03-25 ENCOUNTER — Inpatient Hospital Stay: Payer: Medicare Other | Admitting: Oncology

## 2021-03-25 ENCOUNTER — Telehealth: Payer: Self-pay

## 2021-03-25 ENCOUNTER — Other Ambulatory Visit (HOSPITAL_COMMUNITY): Payer: Self-pay

## 2021-03-25 ENCOUNTER — Inpatient Hospital Stay: Payer: Medicare Other

## 2021-03-25 VITALS — BP 110/79 | HR 69 | Temp 97.8°F | Resp 18 | Ht 68.0 in | Wt 150.6 lb

## 2021-03-25 VITALS — BP 114/75 | HR 55

## 2021-03-25 DIAGNOSIS — Z5189 Encounter for other specified aftercare: Secondary | ICD-10-CM | POA: Insufficient documentation

## 2021-03-25 DIAGNOSIS — Z5111 Encounter for antineoplastic chemotherapy: Secondary | ICD-10-CM | POA: Insufficient documentation

## 2021-03-25 DIAGNOSIS — C250 Malignant neoplasm of head of pancreas: Secondary | ICD-10-CM

## 2021-03-25 LAB — CBC WITH DIFFERENTIAL (CANCER CENTER ONLY)
Abs Immature Granulocytes: 0.09 10*3/uL — ABNORMAL HIGH (ref 0.00–0.07)
Basophils Absolute: 0.2 10*3/uL — ABNORMAL HIGH (ref 0.0–0.1)
Basophils Relative: 2 %
Eosinophils Absolute: 0.5 10*3/uL (ref 0.0–0.5)
Eosinophils Relative: 5 %
HCT: 33 % — ABNORMAL LOW (ref 39.0–52.0)
Hemoglobin: 10.7 g/dL — ABNORMAL LOW (ref 13.0–17.0)
Immature Granulocytes: 1 %
Lymphocytes Relative: 26 %
Lymphs Abs: 2.3 10*3/uL (ref 0.7–4.0)
MCH: 29.7 pg (ref 26.0–34.0)
MCHC: 32.4 g/dL (ref 30.0–36.0)
MCV: 91.7 fL (ref 80.0–100.0)
Monocytes Absolute: 1.1 10*3/uL — ABNORMAL HIGH (ref 0.1–1.0)
Monocytes Relative: 12 %
Neutro Abs: 4.8 10*3/uL (ref 1.7–7.7)
Neutrophils Relative %: 54 %
Platelet Count: 261 10*3/uL (ref 150–400)
RBC: 3.6 MIL/uL — ABNORMAL LOW (ref 4.22–5.81)
RDW: 16.2 % — ABNORMAL HIGH (ref 11.5–15.5)
WBC Count: 9 10*3/uL (ref 4.0–10.5)
nRBC: 0 % (ref 0.0–0.2)

## 2021-03-25 LAB — CMP (CANCER CENTER ONLY)
ALT: 41 U/L (ref 0–44)
AST: 34 U/L (ref 15–41)
Albumin: 3.7 g/dL (ref 3.5–5.0)
Alkaline Phosphatase: 133 U/L — ABNORMAL HIGH (ref 38–126)
Anion gap: 6 (ref 5–15)
BUN: 14 mg/dL (ref 8–23)
CO2: 26 mmol/L (ref 22–32)
Calcium: 8.8 mg/dL — ABNORMAL LOW (ref 8.9–10.3)
Chloride: 106 mmol/L (ref 98–111)
Creatinine: 0.86 mg/dL (ref 0.61–1.24)
GFR, Estimated: >60 mL/min (ref >60)
Glucose, Bld: 247 mg/dL — ABNORMAL HIGH (ref 70–99)
Potassium: 4.3 mmol/L (ref 3.5–5.1)
Sodium: 138 mmol/L (ref 135–145)
Total Bilirubin: 0.3 mg/dL (ref 0.3–1.2)
Total Protein: 6.2 g/dL — ABNORMAL LOW (ref 6.5–8.1)

## 2021-03-25 MED ORDER — SODIUM CHLORIDE 0.9 % IV SOLN
150.0000 mg | Freq: Once | INTRAVENOUS | Status: AC
  Administered 2021-03-25: 150 mg via INTRAVENOUS
  Filled 2021-03-25: qty 5

## 2021-03-25 MED ORDER — SODIUM CHLORIDE 0.9 % IV SOLN
150.0000 mg/m2 | Freq: Once | INTRAVENOUS | Status: AC
  Administered 2021-03-25: 260 mg via INTRAVENOUS
  Filled 2021-03-25: qty 13

## 2021-03-25 MED ORDER — SODIUM CHLORIDE 0.9 % IV SOLN
10.0000 mg | Freq: Once | INTRAVENOUS | Status: AC
  Administered 2021-03-25: 10 mg via INTRAVENOUS
  Filled 2021-03-25: qty 1

## 2021-03-25 MED ORDER — SODIUM CHLORIDE 0.9 % IV SOLN
2400.0000 mg/m2 | INTRAVENOUS | Status: DC
  Administered 2021-03-25: 4250 mg via INTRAVENOUS
  Filled 2021-03-25: qty 85

## 2021-03-25 MED ORDER — HYOSCYAMINE SULFATE 0.125 MG PO TABS
0.1250 mg | ORAL_TABLET | Freq: Four times a day (QID) | ORAL | 0 refills | Status: DC | PRN
  Filled 2021-03-25: qty 30, 8d supply, fill #0

## 2021-03-25 MED ORDER — PALONOSETRON HCL INJECTION 0.25 MG/5ML
0.2500 mg | Freq: Once | INTRAVENOUS | Status: AC
  Administered 2021-03-25: 0.25 mg via INTRAVENOUS
  Filled 2021-03-25: qty 5

## 2021-03-25 MED ORDER — OXALIPLATIN CHEMO INJECTION 100 MG/20ML
85.0000 mg/m2 | Freq: Once | INTRAVENOUS | Status: AC
  Administered 2021-03-25: 150 mg via INTRAVENOUS
  Filled 2021-03-25: qty 30

## 2021-03-25 MED ORDER — SODIUM CHLORIDE 0.9 % IV SOLN
400.0000 mg/m2 | Freq: Once | INTRAVENOUS | Status: AC
  Administered 2021-03-25: 708 mg via INTRAVENOUS
  Filled 2021-03-25: qty 35.4

## 2021-03-25 MED ORDER — DEXTROSE 5 % IV SOLN: Freq: Once | INTRAVENOUS | Status: AC

## 2021-03-25 MED ORDER — ATROPINE SULFATE 1 MG/ML IV SOLN
0.5000 mg | Freq: Once | INTRAVENOUS | Status: AC | PRN
  Administered 2021-03-25: 0.5 mg via INTRAVENOUS
  Filled 2021-03-25: qty 1

## 2021-03-25 NOTE — Progress Notes (Signed)
[facility] OFFICE PROGRESS NOTE   Diagnosis: Pancreas cancer  INTERVAL HISTORY:   Francisco Frank completed on cycle FOLFIRINOX on 03/03/2021.  He reports malaise following chemotherapy.  He had cold sensitivity for 1-1/2 weeks following chemotherapy.  This has resolved.  No other neuropathy symptoms.  No nausea/vomiting, mouth sores, or diarrhea.  He received G-CSF on 03/05/2021.  No bone pain.  Objective:  Vital signs in last 24 hours:  Blood pressure 110/79, pulse 69, temperature 97.8 F (36.6 C), temperature source Oral, resp. rate 18, height 5\' 8"  (1.727 m), weight 150 lb 9.6 oz (68.3 kg), SpO2 100 %.    HEENT: No thrush or ulcers Resp: Lungs clear bilaterally Cardio: Regular rate and rhythm GI: Nontender, no hepatosplenomegaly Vascular: No leg edema Neuro: The vibratory sense is intact at the fingertips bilaterally Skin: Palms without erythema  Portacath/PICC-without erythema  Lab Results:  Lab Results  Component Value Date   WBC 9.0 03/25/2021   HGB 10.7 (L) 03/25/2021   HCT 33.0 (L) 03/25/2021   MCV 91.7 03/25/2021   PLT 261 03/25/2021   NEUTROABS 4.8 03/25/2021    CMP  Lab Results  Component Value Date   NA 135 03/03/2021   K 4.4 03/03/2021   CL 102 03/03/2021   CO2 26 03/03/2021   GLUCOSE 196 (H) 03/03/2021   BUN 13 03/03/2021   CREATININE 0.93 03/03/2021   CALCIUM 8.8 (L) 03/03/2021   PROT 6.2 (L) 03/03/2021   ALBUMIN 3.6 03/03/2021   AST 34 03/03/2021   ALT 34 03/03/2021   ALKPHOS 116 03/03/2021   BILITOT 0.3 03/03/2021   GFRNONAA >60 03/03/2021    Lab Results  Component Value Date   JHE174 472 (H) 03/03/2021    Medications: I have reviewed the patient's current medications.   Assessment/Plan: Pancreas cancer Outside CT-apparent pancreas abnormality (we do not have a copy of the report) MRI abdomen 11/06/2020-pancreatic body and tail atrophy with duct dilatation up to 9 mm.  Both ducts undergo an abrupt transition in the  region of the pancreatic head.  Non border deforming pancreatic head mass measuring 3.2 x 2.6 cm.  No arterial involvement by tumor.  SMV adjacent to presumed tumor without encasement.  Portal vein uninvolved.  No abdominal adenopathy.  Left periaortic 7 mm node not pathologic by size criteria.  No ascites.  Subtle left-sided pericolonic nodule 5 mm. EUS/ERCP 11/11/2020-EGD showed gastritis which was biopsied, mucosal changes in the duodenum, mucosal changes in the duodenum sweep (gastric antral and oxyntic mucosa with no specific histopathologic changes; Warthin Starry stain negative for H pylori).  EUS showed an irregular mass in the pancreatic head measuring 26 mm x 30 mm; the outer margins were irregular.  There was sonographic evidence suggesting invasion into the portal vein (manifested by abutment) and the superior mesenteric vein (manifested by abutment); an intact interface was seen between the mass and the superior mesenteric artery and celiac trunk suggesting a lack of invasion.  Endosonographic imaging in the visualized portion of the liver showed no mass.  There was dilatation of the common bile duct and the common hepatic duct.  No malignant appearing lymph nodes were visualized in the celiac region, peripancreatic region, porta hepatis region.  FNA biopsy of the pancreas head mass showed malignant cells consistent with adenocarcinoma.  Biliary access could not be obtained.  Cholangiogram 11/12/2020-severe intrahepatic biliary dilatation.  A peripheral right hepatic bile duct was successfully cannulated.  Internal/external biliary drain placed.  Cytology on biliary drain bile  showed malignant cells consistent with adenocarcinoma.   CT chest 11/13/2020-no evidence of metastatic disease.   CT pelvis 11/13/2020-nonspecific circumferential wall thickening of the cecum measuring up to 12 mm in thickness.  Otherwise no evidence of intrapelvic metastases.   11/19/2020-biliary stent placement, biliary drain  exchange 11/28/2020-exchange of internal/external biliary drain secondary to persistent jaundice CT abdomen/pelvis 11/28/2020-pancreas head mass, 3.5 x 2.8 cm, no encasement of the celiac trunk or SMA, partial encasement of the portal venous confluence-at least 180 degrees, partial encasement of the SMV-approximately 180 degrees, ventral extension of tumor abuts the posterior duodenum, no adenopathy, percutaneous biliary stent with decompression of previous dilated intrahepatic and common bile ducts Cycle 1 FOLFIRINOX 01/01/2021, irinotecan held Cycle 2 FOLFIRINOX 01/13/2021, irinotecan held Cycle 3 FOLFIRINOX 01/28/2021 Cycle 4 FOLFIRINOX 02/09/2021 Cycle 5 FOLFIRINOX 03/03/2021, Ziextenzo Cycle 6 FOLFIRINOX 03/25/2021, Ziextenzo   Painless jaundice-internal/external biliary drain placed 11/12/2020.  Biliary stent placed and drain exchange 11/19/2020, exchange of internal/external drain 11/28/2020 Weight loss Diabetes diagnosed June 2022 Change in bowel habits February 2022 Colonoscopy 08/06/2020-8 mm polyp in the sigmoid colon, a few small and large mouth diverticula in the sigmoid colon, normal mucosa found in the entire colon with biopsies for histology taken with a cold forceps from the right colon and left colon for evaluation of microscopic colitis (right colon-colonic mucosa with no significant pathologic findings; left colon-colonic mucosa with no significant pathologic findings; sigmoid polyp-tubular adenoma, negative for high-grade dysplasia). Hypertension Admission 12/12/2020 with acute cholecystitis, cholecystostomy tube 12/17/2020    Disposition: Francisco Frank appears stable.  He has completed 5 cycles of FOLFIRINOX.  He continues to tolerate chemotherapy well.  He will complete another cycle today.  He will receive G-CSF support.  Francisco Frank will undergo a restaging pancreas protocol CT after this cycle.  He will return for an office visit in 2 weeks.  We will refer him to Dr. Zenia Resides for  preoperative evaluation.  Betsy Coder, MD  03/25/2021  9:36 AM

## 2021-03-25 NOTE — Telephone Encounter (Signed)
Pt in the office today stated prescription for Levsin was not sent to pharmacy. TC to [facility] in [place] inquired about prescription sent Was informed that prescription was placed on hold because insurance didn't cover it. TC to [facility] to see if coverage goes through was informed by pharmacy part of the prescription because of medicare part D was not covered and Pt would have to pay 27 dollars. Fowarded prescription to [facility] to fill prescription.

## 2021-03-25 NOTE — Patient Instructions (Addendum)
[place]   The chemotherapy medication bag should finish at 46 hours, 96 hours, or 7 days. For example, if your pump is scheduled for 46 hours and it was put on at 4:00 p.m., it should finish at 2:00 p.m. the day it is scheduled to come off regardless of your appointment time.     Estimated time to finish at 1:45 Friday, 03/27/21.   If the display on your pump reads "Low Volume" and it is beeping, take the batteries out of the pump and come to the cancer center for it to be taken off.   If the pump alarms go off prior to the pump reading "Low Volume" then call [phone] and someone can assist you.  If the plunger comes out and the chemotherapy medication is leaking out, please use your home chemo spill kit to clean up the spill. Do NOT use paper towels or other household products.  If you have problems or questions regarding your pump, please call either [phone] (24 hours a day) or the cancer center Monday-Friday 8:00 a.m.- 4:30 p.m. at the clinic number and we will assist you. If you are unable to get assistance, then go to the nearest Emergency Department and ask the staff to contact the IV team for assistance.  Discharge Instructions: Thank you for choosing [facility] to provide your oncology and hematology care.   If you have a lab appointment with the [facility], please go directly to the [facility] and check in at the registration area.   Wear comfortable clothing and clothing appropriate for easy access to any Portacath or PICC line.   We strive to give you quality time with your provider. You may need to reschedule your appointment if you arrive late (15 or more minutes).  Arriving late affects you and other patients whose appointments are after yours.  Also, if you miss three or more appointments without notifying the office, you may be dismissed from the clinic at the providers discretion.      For prescription refill  requests, have your pharmacy contact our office and allow 72 hours for refills to be completed.    Today you received the following chemotherapy and/or immunotherapy agents Irinotecan, oxaliplatin, leucovorin, fluorouracil      To help prevent nausea and vomiting after your treatment, we encourage you to take your nausea medication as directed.  BELOW ARE SYMPTOMS THAT SHOULD BE REPORTED IMMEDIATELY: *FEVER GREATER THAN 100.4 F (38 C) OR HIGHER *CHILLS OR SWEATING *NAUSEA AND VOMITING THAT IS NOT CONTROLLED WITH YOUR NAUSEA MEDICATION *UNUSUAL SHORTNESS OF BREATH *UNUSUAL BRUISING OR BLEEDING *URINARY PROBLEMS (pain or burning when urinating, or frequent urination) *BOWEL PROBLEMS (unusual diarrhea, constipation, pain near the anus) TENDERNESS IN MOUTH AND THROAT WITH OR WITHOUT PRESENCE OF ULCERS (sore throat, sores in mouth, or a toothache) UNUSUAL RASH, SWELLING OR PAIN  UNUSUAL VAGINAL DISCHARGE OR ITCHING   Items with * indicate a potential emergency and should be followed up as soon as possible or go to the Emergency Department if any problems should occur.  Please show the CHEMOTHERAPY ALERT CARD or IMMUNOTHERAPY ALERT CARD at check-in to the Emergency Department and triage nurse.  Should you have questions after your visit or need to cancel or reschedule your appointment, please contact [facility]  Dept: [phone]  and follow the prompts.  Office hours are 8:00 a.m. to 4:30 p.m. Monday - Friday. Please note that voicemails left  after 4:00 p.m. may not be returned until the following business day.  We are closed weekends and major holidays. You have access to a nurse at all times for urgent questions. Please call the main number to the clinic Dept: [phone] and follow the prompts.   For any non-urgent questions, you may also contact your provider using MyChart. We now offer e-Visits for anyone 68 and older to request care online for non-urgent  symptoms. For details visit [email or web address].   Also download the MyChart app! Go to the app store, search "MyChart", open the app, select [facility], and log in with your MyChart username and password.  Due to Covid, a mask is required upon entering the hospital/clinic. If you do not have a mask, one will be given to you upon arrival. For doctor visits, patients may have 1 support person aged 66 or older with them. For treatment visits, patients cannot have anyone with them due to current Covid guidelines and our immunocompromised population.   Irinotecan injection What is this medication? IRINOTECAN (ir in oh TEE kan ) is a chemotherapy drug. It is used to treat colon and rectal cancer. This medicine may be used for other purposes; ask your health care provider or pharmacist if you have questions. COMMON BRAND NAME(S): Camptosar What should I tell my care team before I take this medication? They need to know if you have any of these conditions: dehydration diarrhea infection (especially a virus infection such as chickenpox, cold sores, or herpes) liver disease low blood counts, like low white cell, platelet, or red cell counts low levels of calcium, magnesium, or potassium in the blood recent or ongoing radiation therapy an unusual or allergic reaction to irinotecan, other medicines, foods, dyes, or preservatives pregnant or trying to get pregnant breast-feeding How should I use this medication? This drug is given as an infusion into a vein. It is administered in a hospital or clinic by a specially trained health care professional. Talk to your pediatrician regarding the use of this medicine in children. Special care may be needed. Overdosage: If you think you have taken too much of this medicine contact a poison control center or emergency room at once. NOTE: This medicine is only for you. Do not share this medicine with others. What if I miss a dose? It is important not to  miss your dose. Call your doctor or health care professional if you are unable to keep an appointment. What may interact with this medication? Do not take this medicine with any of the following medications: cobicistat itraconazole This medicine may interact with the following medications: antiviral medicines for HIV or AIDS certain antibiotics like rifampin or rifabutin certain medicines for fungal infections like ketoconazole, posaconazole, and voriconazole certain medicines for seizures like carbamazepine, phenobarbital, phenotoin clarithromycin gemfibrozil nefazodone [facility]. John's Wort This list may not describe all possible interactions. Give your health care provider a list of all the medicines, herbs, non-prescription drugs, or dietary supplements you use. Also tell them if you smoke, drink alcohol, or use illegal drugs. Some items may interact with your medicine. What should I watch for while using this medication? Your condition will be monitored carefully while you are receiving this medicine. You will need important blood work done while you are taking this medicine. This drug may make you feel generally unwell. This is not uncommon, as chemotherapy can affect healthy cells as well as cancer cells. Report any side effects. Continue your course of treatment even though you feel  ill unless your doctor tells you to stop. In some cases, you may be given additional medicines to help with side effects. Follow all directions for their use. You may get drowsy or dizzy. Do not drive, use machinery, or do anything that needs mental alertness until you know how this medicine affects you. Do not stand or sit up quickly, especially if you are an older patient. This reduces the risk of dizzy or fainting spells. Call your health care professional for advice if you get a fever, chills, or sore throat, or other symptoms of a cold or flu. Do not treat yourself. This medicine decreases your body's ability  to fight infections. Try to avoid being around people who are sick. Avoid taking products that contain aspirin, acetaminophen, ibuprofen, naproxen, or ketoprofen unless instructed by your doctor. These medicines may hide a fever. This medicine may increase your risk to bruise or bleed. Call your doctor or health care professional if you notice any unusual bleeding. Be careful brushing and flossing your teeth or using a toothpick because you may get an infection or bleed more easily. If you have any dental work done, tell your dentist you are receiving this medicine. Do not become pregnant while taking this medicine or for 6 months after stopping it. Women should inform their health care professional if they wish to become pregnant or think they might be pregnant. Men should not father a child while taking this medicine and for 3 months after stopping it. There is potential for serious side effects to an unborn child. Talk to your health care professional for more information. Do not breast-feed an infant while taking this medicine or for 7 days after stopping it. This medicine has caused ovarian failure in some women. This medicine may make it more difficult to get pregnant. Talk to your health care professional if you are concerned about your fertility. This medicine has caused decreased sperm counts in some men. This may make it more difficult to father a child. Talk to your health care professional if you are concerned about your fertility. What side effects may I notice from receiving this medication? Side effects that you should report to your doctor or health care professional as soon as possible: allergic reactions like skin rash, itching or hives, swelling of the face, lips, or tongue chest pain diarrhea flushing, runny nose, sweating during infusion low blood counts - this medicine may decrease the number of white blood cells, red blood cells and platelets. You may be at increased risk for  infections and bleeding. nausea, vomiting pain, swelling, warmth in the leg signs of decreased platelets or bleeding - bruising, pinpoint red spots on the skin, black, tarry stools, blood in the urine signs of infection - fever or chills, cough, sore throat, pain or difficulty passing urine signs of decreased red blood cells - unusually weak or tired, fainting spells, lightheadedness Side effects that usually do not require medical attention (report to your doctor or health care professional if they continue or are bothersome): constipation hair loss headache loss of appetite mouth sores stomach pain This list may not describe all possible side effects. Call your doctor for medical advice about side effects. You may report side effects to FDA at 1-800-FDA-1088. Where should I keep my medication? This drug is given in a hospital or clinic and will not be stored at home. NOTE: This sheet is a summary. It may not cover all possible information. If you have questions about this medicine, talk  to your doctor, pharmacist, or health care provider.  2022 Elsevier/Gold Standard (2020-11-25 00:00:00)  Oxaliplatin Injection What is this medication? OXALIPLATIN (ox AL i PLA tin) is a chemotherapy drug. It targets fast dividing cells, like cancer cells, and causes these cells to die. This medicine is used to treat cancers of the colon and rectum, and many other cancers. This medicine may be used for other purposes; ask your health care provider or pharmacist if you have questions. COMMON BRAND NAME(S): Eloxatin What should I tell my care team before I take this medication? They need to know if you have any of these conditions: heart disease history of irregular heartbeat liver disease low blood counts, like white cells, platelets, or red blood cells lung or breathing disease, like asthma take medicines that treat or prevent blood clots tingling of the fingers or toes, or other nerve disorder an  unusual or allergic reaction to oxaliplatin, other chemotherapy, other medicines, foods, dyes, or preservatives pregnant or trying to get pregnant breast-feeding How should I use this medication? This drug is given as an infusion into a vein. It is administered in a hospital or clinic by a specially trained health care professional. Talk to your pediatrician regarding the use of this medicine in children. Special care may be needed. Overdosage: If you think you have taken too much of this medicine contact a poison control center or emergency room at once. NOTE: This medicine is only for you. Do not share this medicine with others. What if I miss a dose? It is important not to miss a dose. Call your doctor or health care professional if you are unable to keep an appointment. What may interact with this medication? Do not take this medicine with any of the following medications: cisapride dronedarone pimozide thioridazine This medicine may also interact with the following medications: aspirin and aspirin-like medicines certain medicines that treat or prevent blood clots like warfarin, apixaban, dabigatran, and rivaroxaban cisplatin cyclosporine diuretics medicines for infection like acyclovir, adefovir, amphotericin B, bacitracin, cidofovir, foscarnet, ganciclovir, gentamicin, pentamidine, vancomycin NSAIDs, medicines for pain and inflammation, like ibuprofen or naproxen other medicines that prolong the QT interval (an abnormal heart rhythm) pamidronate zoledronic acid This list may not describe all possible interactions. Give your health care provider a list of all the medicines, herbs, non-prescription drugs, or dietary supplements you use. Also tell them if you smoke, drink alcohol, or use illegal drugs. Some items may interact with your medicine. What should I watch for while using this medication? Your condition will be monitored carefully while you are receiving this medicine. You  may need blood work done while you are taking this medicine. This medicine may make you feel generally unwell. This is not uncommon as chemotherapy can affect healthy cells as well as cancer cells. Report any side effects. Continue your course of treatment even though you feel ill unless your healthcare professional tells you to stop. This medicine can make you more sensitive to cold. Do not drink cold drinks or use ice. Cover exposed skin before coming in contact with cold temperatures or cold objects. When out in cold weather wear warm clothing and cover your mouth and nose to warm the air that goes into your lungs. Tell your doctor if you get sensitive to the cold. Do not become pregnant while taking this medicine or for 9 months after stopping it. Women should inform their health care professional if they wish to become pregnant or think they might be pregnant. Men should  not father a child while taking this medicine and for 6 months after stopping it. There is potential for serious side effects to an unborn child. Talk to your health care professional for more information. Do not breast-feed a child while taking this medicine or for 3 months after stopping it. This medicine has caused ovarian failure in some women. This medicine may make it more difficult to get pregnant. Talk to your health care professional if you are concerned about your fertility. This medicine has caused decreased sperm counts in some men. This may make it more difficult to father a child. Talk to your health care professional if you are concerned about your fertility. This medicine may increase your risk of getting an infection. Call your health care professional for advice if you get a fever, chills, or sore throat, or other symptoms of a cold or flu. Do not treat yourself. Try to avoid being around people who are sick. Avoid taking medicines that contain aspirin, acetaminophen, ibuprofen, naproxen, or ketoprofen unless  instructed by your health care professional. These medicines may hide a fever. Be careful brushing or flossing your teeth or using a toothpick because you may get an infection or bleed more easily. If you have any dental work done, tell your dentist you are receiving this medicine. What side effects may I notice from receiving this medication? Side effects that you should report to your doctor or health care professional as soon as possible: allergic reactions like skin rash, itching or hives, swelling of the face, lips, or tongue breathing problems cough low blood counts - this medicine may decrease the number of white blood cells, red blood cells, and platelets. You may be at increased risk for infections and bleeding nausea, vomiting pain, redness, or irritation at site where injected pain, tingling, numbness in the hands or feet signs and symptoms of bleeding such as bloody or black, tarry stools; red or dark brown urine; spitting up blood or brown material that looks like coffee grounds; red spots on the skin; unusual bruising or bleeding from the eyes, gums, or nose signs and symptoms of a dangerous change in heartbeat or heart rhythm like chest pain; dizziness; fast, irregular heartbeat; palpitations; feeling faint or lightheaded; falls signs and symptoms of infection like fever; chills; cough; sore throat; pain or trouble passing urine signs and symptoms of liver injury like dark yellow or brown urine; general ill feeling or flu-like symptoms; light-colored stools; loss of appetite; nausea; right upper belly pain; unusually weak or tired; yellowing of the eyes or skin signs and symptoms of low red blood cells or anemia such as unusually weak or tired; feeling faint or lightheaded; falls signs and symptoms of muscle injury like dark urine; trouble passing urine or change in the amount of urine; unusually weak or tired; muscle pain; back pain Side effects that usually do not require medical  attention (report to your doctor or health care professional if they continue or are bothersome): changes in taste diarrhea gas hair loss loss of appetite mouth sores This list may not describe all possible side effects. Call your doctor for medical advice about side effects. You may report side effects to FDA at 1-800-FDA-1088. Where should I keep my medication? This drug is given in a hospital or clinic and will not be stored at home. NOTE: This sheet is a summary. It may not cover all possible information. If you have questions about this medicine, talk to your doctor, pharmacist, or health care provider.  2022 Elsevier/Gold Standard (2020-11-25 00:00:00)  Leucovorin injection What is this medication? LEUCOVORIN (loo koe VOR in) is used to prevent or treat the harmful effects of some medicines. This medicine is used to treat anemia caused by a low amount of folic acid in the body. It is also used with 5-fluorouracil (5-FU) to treat colon cancer. This medicine may be used for other purposes; ask your health care provider or pharmacist if you have questions. What should I tell my care team before I take this medication? They need to know if you have any of these conditions: anemia from low levels of vitamin B-12 in the blood an unusual or allergic reaction to leucovorin, folic acid, other medicines, foods, dyes, or preservatives pregnant or trying to get pregnant breast-feeding How should I use this medication? This medicine is for injection into a muscle or into a vein. It is given by a health care professional in a hospital or clinic setting. Talk to your pediatrician regarding the use of this medicine in children. Special care may be needed. Overdosage: If you think you have taken too much of this medicine contact a poison control center or emergency room at once. NOTE: This medicine is only for you. Do not share this medicine with others. What if I miss a dose? This does not  apply. What may interact with this medication? capecitabine fluorouracil phenobarbital phenytoin primidone trimethoprim-sulfamethoxazole This list may not describe all possible interactions. Give your health care provider a list of all the medicines, herbs, non-prescription drugs, or dietary supplements you use. Also tell them if you smoke, drink alcohol, or use illegal drugs. Some items may interact with your medicine. What should I watch for while using this medication? Your condition will be monitored carefully while you are receiving this medicine. This medicine may increase the side effects of 5-fluorouracil, 5-FU. Tell your doctor or health care professional if you have diarrhea or mouth sores that do not get better or that get worse. What side effects may I notice from receiving this medication? Side effects that you should report to your doctor or health care professional as soon as possible: allergic reactions like skin rash, itching or hives, swelling of the face, lips, or tongue breathing problems fever, infection mouth sores unusual bleeding or bruising unusually weak or tired Side effects that usually do not require medical attention (report to your doctor or health care professional if they continue or are bothersome): constipation or diarrhea loss of appetite nausea, vomiting This list may not describe all possible side effects. Call your doctor for medical advice about side effects. You may report side effects to FDA at 1-800-FDA-1088. Where should I keep my medication? This drug is given in a hospital or clinic and will not be stored at home. NOTE: This sheet is a summary. It may not cover all possible information. If you have questions about this medicine, talk to your doctor, pharmacist, or health care provider.  2022 Elsevier/Gold Standard (2007-09-14 00:00:00)  Fluorouracil, 5-FU injection What is this medication? FLUOROURACIL, 5-FU (flure oh YOOR a sil) is a  chemotherapy drug. It slows the growth of cancer cells. This medicine is used to treat many types of cancer like breast cancer, colon or rectal cancer, pancreatic cancer, and stomach cancer. This medicine may be used for other purposes; ask your health care provider or pharmacist if you have questions. COMMON BRAND NAME(S): Adrucil What should I tell my care team before I take this medication? They need to know if  you have any of these conditions: blood disorders dihydropyrimidine dehydrogenase (DPD) deficiency infection (especially a virus infection such as chickenpox, cold sores, or herpes) kidney disease liver disease malnourished, poor nutrition recent or ongoing radiation therapy an unusual or allergic reaction to fluorouracil, other chemotherapy, other medicines, foods, dyes, or preservatives pregnant or trying to get pregnant breast-feeding How should I use this medication? This drug is given as an infusion or injection into a vein. It is administered in a hospital or clinic by a specially trained health care professional. Talk to your pediatrician regarding the use of this medicine in children. Special care may be needed. Overdosage: If you think you have taken too much of this medicine contact a poison control center or emergency room at once. NOTE: This medicine is only for you. Do not share this medicine with others. What if I miss a dose? It is important not to miss your dose. Call your doctor or health care professional if you are unable to keep an appointment. What may interact with this medication? Do not take this medicine with any of the following medications: live virus vaccines This medicine may also interact with the following medications: medicines that treat or prevent blood clots like warfarin, enoxaparin, and dalteparin This list may not describe all possible interactions. Give your health care provider a list of all the medicines, herbs, non-prescription drugs, or  dietary supplements you use. Also tell them if you smoke, drink alcohol, or use illegal drugs. Some items may interact with your medicine. What should I watch for while using this medication? Visit your doctor for checks on your progress. This drug may make you feel generally unwell. This is not uncommon, as chemotherapy can affect healthy cells as well as cancer cells. Report any side effects. Continue your course of treatment even though you feel ill unless your doctor tells you to stop. In some cases, you may be given additional medicines to help with side effects. Follow all directions for their use. Call your doctor or health care professional for advice if you get a fever, chills or sore throat, or other symptoms of a cold or flu. Do not treat yourself. This drug decreases your body's ability to fight infections. Try to avoid being around people who are sick. This medicine may increase your risk to bruise or bleed. Call your doctor or health care professional if you notice any unusual bleeding. Be careful brushing and flossing your teeth or using a toothpick because you may get an infection or bleed more easily. If you have any dental work done, tell your dentist you are receiving this medicine. Avoid taking products that contain aspirin, acetaminophen, ibuprofen, naproxen, or ketoprofen unless instructed by your doctor. These medicines may hide a fever. Do not become pregnant while taking this medicine. Women should inform their doctor if they wish to become pregnant or think they might be pregnant. There is a potential for serious side effects to an unborn child. Talk to your health care professional or pharmacist for more information. Do not breast-feed an infant while taking this medicine. Men should inform their doctor if they wish to father a child. This medicine may lower sperm counts. Do not treat diarrhea with over the counter products. Contact your doctor if you have diarrhea that lasts  more than 2 days or if it is severe and watery. This medicine can make you more sensitive to the sun. Keep out of the sun. If you cannot avoid being in the sun, wear  protective clothing and use sunscreen. Do not use sun lamps or tanning beds/booths. What side effects may I notice from receiving this medication? Side effects that you should report to your doctor or health care professional as soon as possible: allergic reactions like skin rash, itching or hives, swelling of the face, lips, or tongue low blood counts - this medicine may decrease the number of white blood cells, red blood cells and platelets. You may be at increased risk for infections and bleeding. signs of infection - fever or chills, cough, sore throat, pain or difficulty passing urine signs of decreased platelets or bleeding - bruising, pinpoint red spots on the skin, black, tarry stools, blood in the urine signs of decreased red blood cells - unusually weak or tired, fainting spells, lightheadedness breathing problems changes in vision chest pain mouth sores nausea and vomiting pain, swelling, redness at site where injected pain, tingling, numbness in the hands or feet redness, swelling, or sores on hands or feet stomach pain unusual bleeding Side effects that usually do not require medical attention (report to your doctor or health care professional if they continue or are bothersome): changes in finger or toe nails diarrhea dry or itchy skin hair loss headache loss of appetite sensitivity of eyes to the light stomach upset unusually teary eyes This list may not describe all possible side effects. Call your doctor for medical advice about side effects. You may report side effects to FDA at 1-800-FDA-1088. Where should I keep my medication? This drug is given in a hospital or clinic and will not be stored at home. NOTE: This sheet is a summary. It may not cover all possible information. If you have questions about  this medicine, talk to your doctor, pharmacist, or health care provider.  2022 [employer] (2020-11-25 00:00:00)

## 2021-03-25 NOTE — Telephone Encounter (Signed)
Patient seen by Dr. Sherrill today ? ?Vitals are within treatment parameters. ? ?Labs reviewed by Dr. Sherrill and are within treatment parameters. ? ?Per physician team, patient is ready for treatment and there are NO modifications to the treatment plan.  ?

## 2021-03-25 NOTE — Progress Notes (Signed)
Patient presents for treatment. RN assessment completed along with the following:  Labs/vitals reviewed - Yes, and within treatment parameters.   Weight within 10% of previous measurement - Yes Oncology Treatment Attestation completed for current therapy- Yes, on date 12/10/20 Informed consent completed and reflects current therapy/intent - Yes, on date 01/01/21             Provider progress note reviewed - Yes, today's provider note was reviewed. Treatment/Antibody/Supportive plan reviewed - Yes, and there are no adjustments needed for today's treatment. S&H and other orders reviewed - Yes, and there are no additional orders identified. Previous treatment date reviewed - Yes, and the appropriate amount of time has elapsed between treatments. Clinic Hand Off Received from - none  Patient to proceed with treatment.

## 2021-03-26 ENCOUNTER — Other Ambulatory Visit (HOSPITAL_COMMUNITY): Payer: Self-pay

## 2021-03-27 ENCOUNTER — Other Ambulatory Visit: Payer: Self-pay

## 2021-03-27 ENCOUNTER — Inpatient Hospital Stay: Payer: Medicare Other

## 2021-03-27 VITALS — BP 126/82 | HR 55 | Temp 98.2°F | Resp 18

## 2021-03-27 DIAGNOSIS — C250 Malignant neoplasm of head of pancreas: Secondary | ICD-10-CM

## 2021-03-27 DIAGNOSIS — Z5111 Encounter for antineoplastic chemotherapy: Secondary | ICD-10-CM | POA: Diagnosis not present

## 2021-03-27 MED ORDER — PEGFILGRASTIM-BMEZ 6 MG/0.6ML ~~LOC~~ SOSY
6.0000 mg | PREFILLED_SYRINGE | Freq: Once | SUBCUTANEOUS | Status: AC
  Administered 2021-03-27: 6 mg via SUBCUTANEOUS

## 2021-03-27 MED ORDER — HEPARIN SOD (PORK) LOCK FLUSH 100 UNIT/ML IV SOLN
500.0000 [IU] | Freq: Once | INTRAVENOUS | Status: AC | PRN
  Administered 2021-03-27: 500 [IU]

## 2021-03-27 MED ORDER — SODIUM CHLORIDE 0.9% FLUSH
10.0000 mL | INTRAVENOUS | Status: DC | PRN
  Administered 2021-03-27: 10 mL

## 2021-03-27 NOTE — Patient Instructions (Signed)

## 2021-04-05 ENCOUNTER — Other Ambulatory Visit: Payer: Self-pay | Admitting: Oncology

## 2021-04-06 ENCOUNTER — Ambulatory Visit (HOSPITAL_BASED_OUTPATIENT_CLINIC_OR_DEPARTMENT_OTHER)
Admission: RE | Admit: 2021-04-06 | Discharge: 2021-04-06 | Disposition: A | Discharge status: Home / Self Care | Payer: Medicare Other | Source: Ambulatory Visit | Attending: Oncology | Admitting: Oncology

## 2021-04-06 ENCOUNTER — Other Ambulatory Visit: Payer: Self-pay

## 2021-04-06 ENCOUNTER — Encounter (HOSPITAL_BASED_OUTPATIENT_CLINIC_OR_DEPARTMENT_OTHER): Payer: Self-pay

## 2021-04-06 ENCOUNTER — Inpatient Hospital Stay: Payer: Medicare Other

## 2021-04-06 DIAGNOSIS — C250 Malignant neoplasm of head of pancreas: Secondary | ICD-10-CM | POA: Insufficient documentation

## 2021-04-06 IMAGING — CT CT ABDOMEN WO/W CM
2 of 12 series · 9 of 46 positions shown, 15 images · IV contrast (APPLIED)
Comparison: [DATE]

CLINICAL DATA: Restaging pancreas neoplasm.

EXAM:
CT ABDOMEN WITHOUT AND WITH CONTRAST
TECHNIQUE: Multidetector CT imaging of the abdomen was performed following the
standard protocol before and following the bolus administration of
intravenous contrast.

[Series 10: cor arterial · coronal · arterial · 0.46mm/px · 3 of 141 slices shown, 4 images]
[im 36/141  soft-tissue]
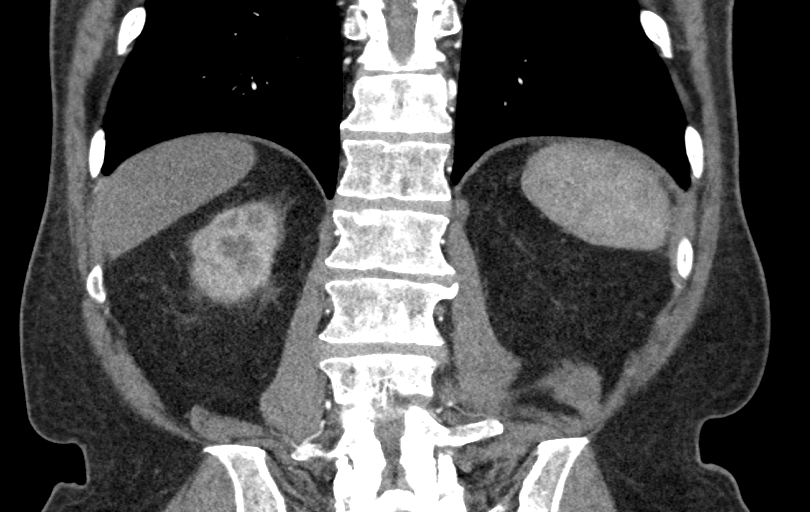
[im 71/141  soft-tissue]
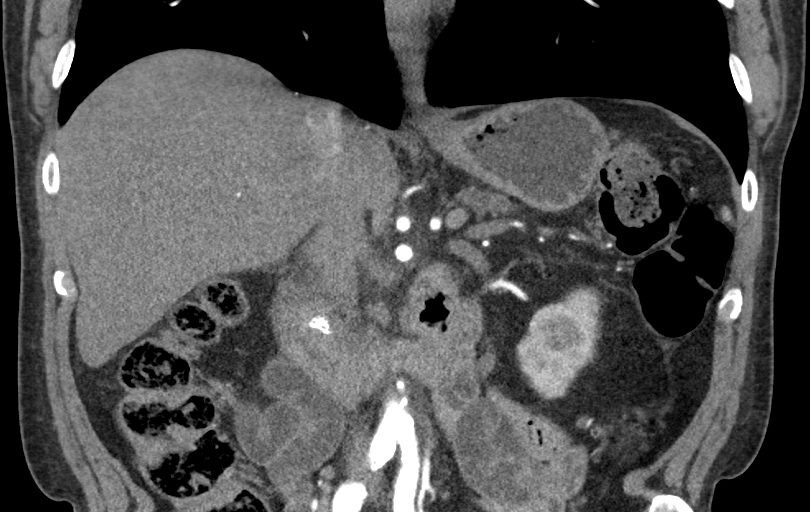
[im 71/141  bone]
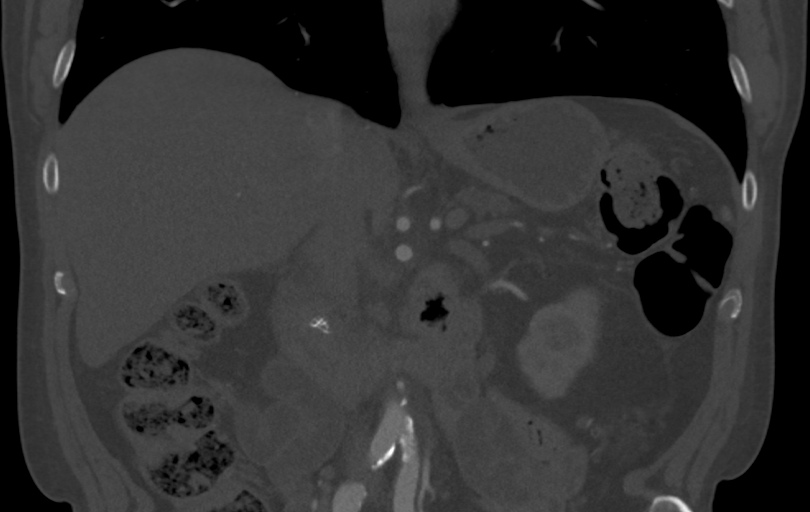
[im 106/141  soft-tissue]
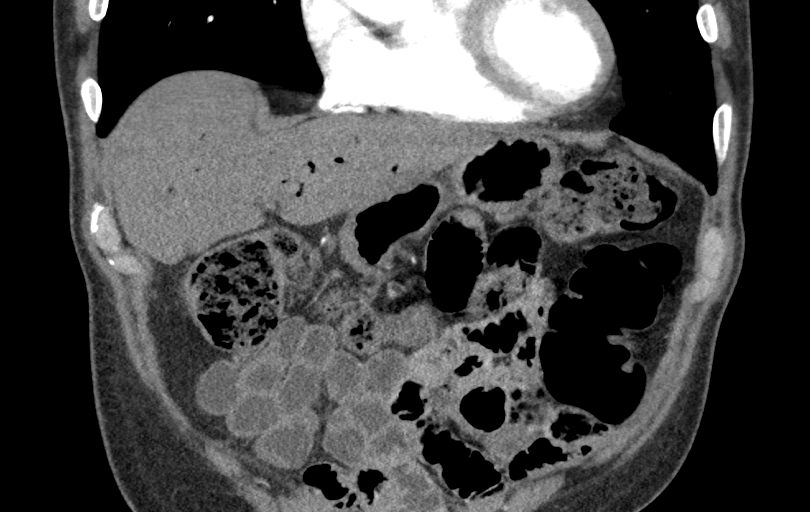

[Series 14: portal pv 2.0 · axial · portal-venous · 0.77mm/px · z∈[-40,+130]mm · 6 of 120 slices shown, 11 images]
[im 18/120  soft-tissue]
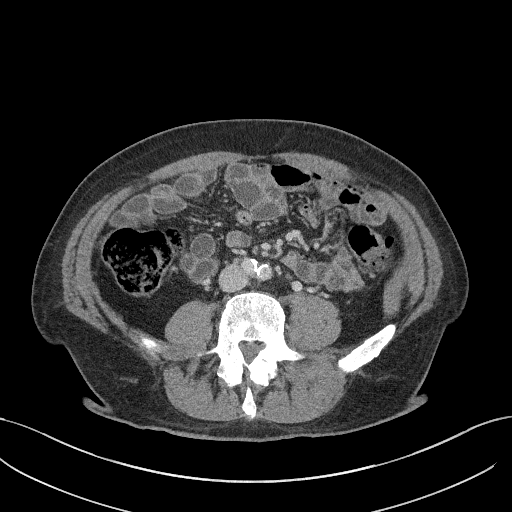
[im 18/120  bone]
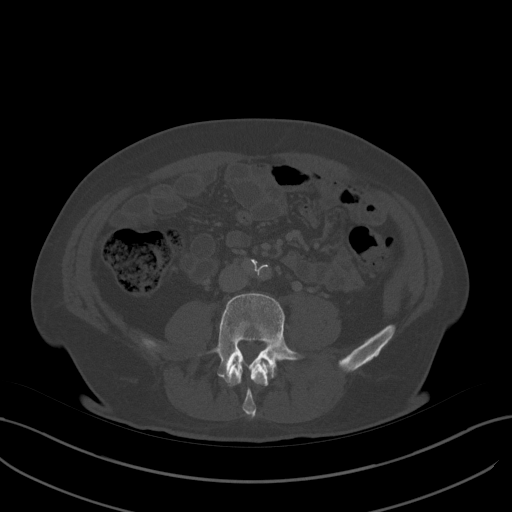
[im 35/120  soft-tissue]
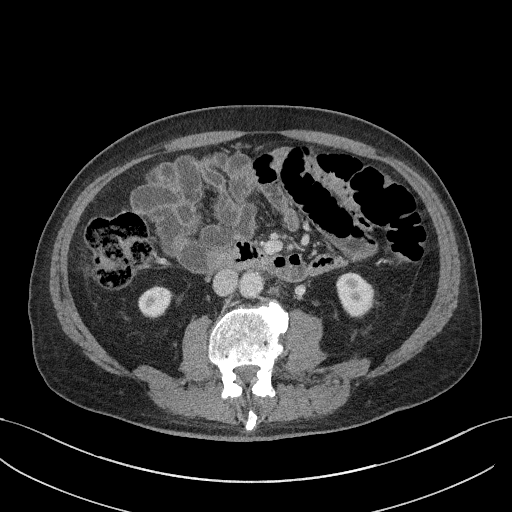
[im 52/120  soft-tissue]
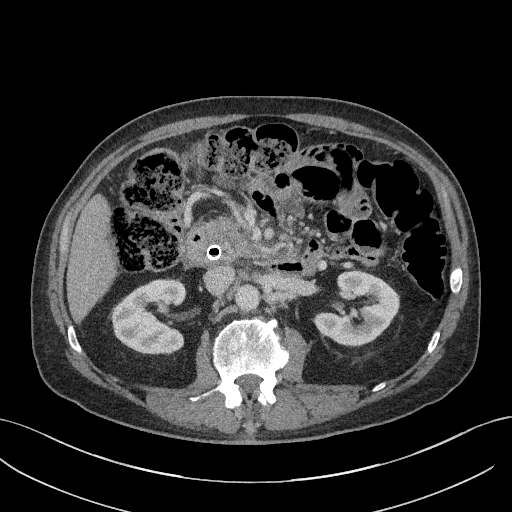
[im 52/120  lung]
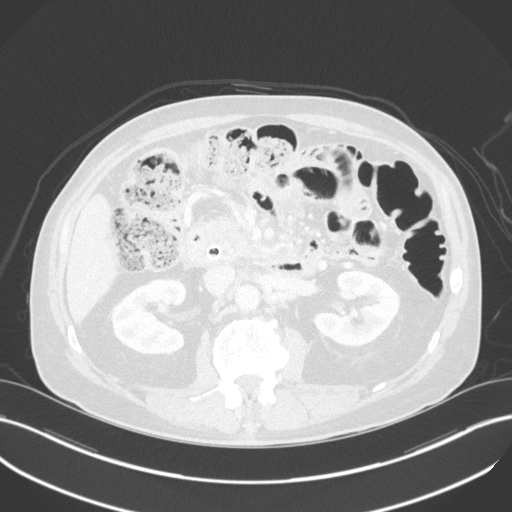
[im 69/120  soft-tissue]
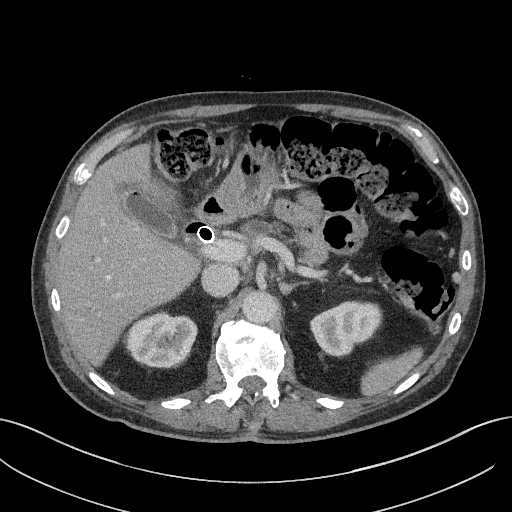
[im 69/120  lung]
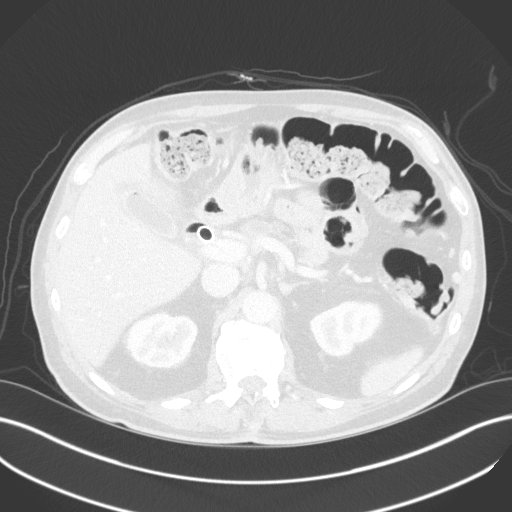
[im 86/120  soft-tissue]
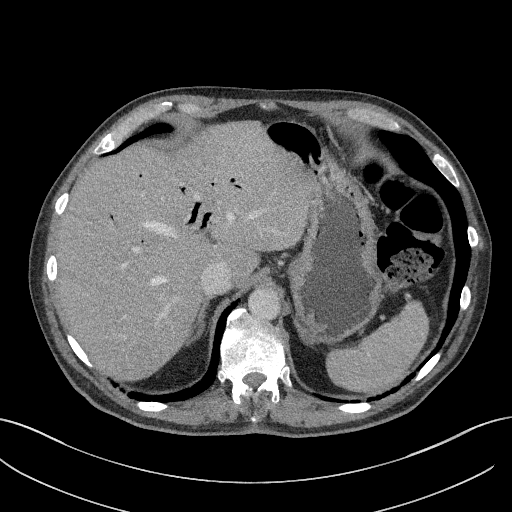
[im 86/120  lung]
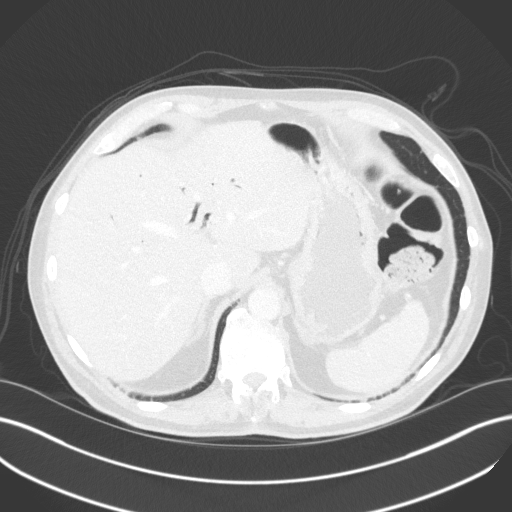
[im 103/120  soft-tissue]
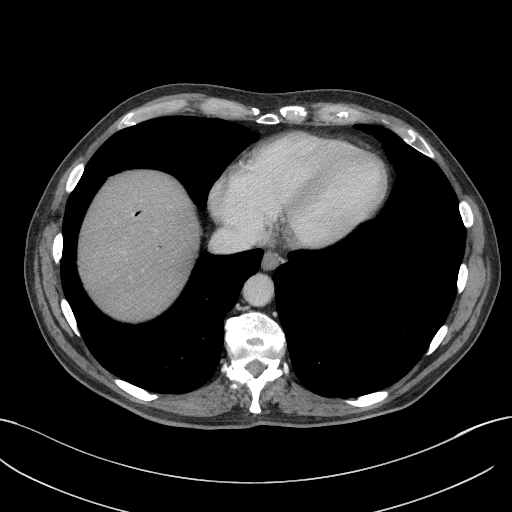
[im 103/120  lung]
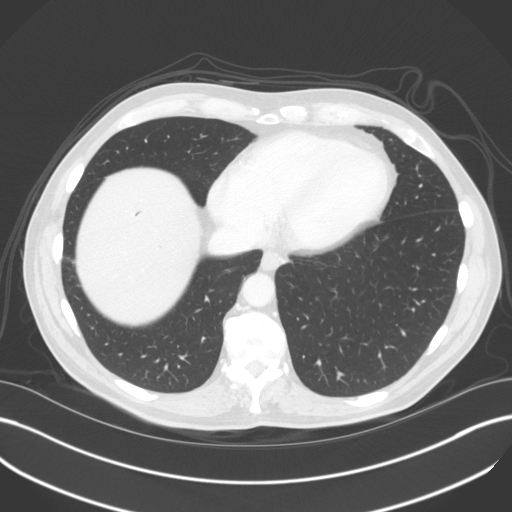

[9 of 46 positions shown; findings below may reference images not displayed]

RADIATION DOSE REDUCTION: This exam was performed according to the
departmental dose-optimization program which includes automated
exposure control, adjustment of the mA and/or kV according to
patient size and/or use of iterative reconstruction technique.

CONTRAST:  75mL OMNIPAQUE IOHEXOL 300 MG/ML  SOLN
FINDINGS: Lower chest: No acute abnormality.

Hepatobiliary: No suspicious liver lesion identified to suggest
liver metastasis. Pneumobilia is identified with common bile duct
stent in place.

Pancreas: Atrophy with diffuse main duct dilatation is again noted
involving the neck, body and tail of pancreas secondary to
ill-defined mass within head of pancreas. Margins of this mass are
difficult to visualize on today's study. Best estimate is that this
measures approximately 3.4 x 3.0 cm, image [DATE]. Formally this
measured 3.5 x 2.8 cm. New small central area of cystic change
identified within this mass measuring 1 cm, image 71/14. As noted
previously there is partial encasement and narrowing of the portal
venous confluence., image 82/16. Since the previous exam there is
been interval development of nonocclusive thrombus the distal
superior mesenteric vein, image 84/16. As before there is no signs
of celiac artery or superior mesenteric artery encasement.

Spleen: Normal in size without focal abnormality.

Adrenals/Urinary Tract: Normal adrenal glands. No nephrolithiasis,
hydronephrosis, or nephrolithiasis.

Stomach/Bowel: Stomach appears nondistended. No bowel wall
thickening, inflammation, or distension.

Vascular/Lymphatic: Aortic atherosclerosis without aneurysm.
Preaortic lymph node measures 0.7 cm, image 71/2. Previously 0.6 cm.
Left periaortic lymph node measures 1.1 cm, image 75/14. Formally
this measured 1.1 cm.

Other: There is no ascites or focal fluid collections.

Musculoskeletal: Spondylosis identified within the lumbar spine.
IMPRESSION: 1. No significant change in ill-defined mass involving the head of
pancreas which partially encases the portal venous confluence.
2. Interval development of nonocclusive thrombus within the distal
superior mesenteric vein.
3. Borderline upper abdominal lymph nodes are not significantly
changed in the interval.
4. No signs of liver metastasis.
5. Aortic Atherosclerosis ([LJ]-[LJ]).

## 2021-04-06 MED ORDER — IOHEXOL 300 MG/ML  SOLN
75.0000 mL | Freq: Once | INTRAMUSCULAR | Status: AC | PRN
  Administered 2021-04-06: 75 mL via INTRAVENOUS

## 2021-04-06 MED ORDER — HEPARIN SOD (PORK) LOCK FLUSH 100 UNIT/ML IV SOLN
500.0000 [IU] | Freq: Once | INTRAVENOUS | Status: AC
  Administered 2021-04-06: 500 [IU] via INTRAVENOUS

## 2021-04-08 ENCOUNTER — Inpatient Hospital Stay: Payer: Medicare Other

## 2021-04-08 ENCOUNTER — Other Ambulatory Visit: Payer: Self-pay

## 2021-04-08 ENCOUNTER — Inpatient Hospital Stay: Payer: Medicare Other | Admitting: Oncology

## 2021-04-08 ENCOUNTER — Encounter: Payer: Self-pay | Admitting: *Deleted

## 2021-04-08 VITALS — BP 129/89 | HR 59

## 2021-04-08 VITALS — BP 116/84 | HR 69 | Temp 97.8°F | Resp 20 | Ht 68.0 in | Wt 151.0 lb

## 2021-04-08 DIAGNOSIS — C250 Malignant neoplasm of head of pancreas: Secondary | ICD-10-CM

## 2021-04-08 DIAGNOSIS — Z5111 Encounter for antineoplastic chemotherapy: Secondary | ICD-10-CM | POA: Diagnosis not present

## 2021-04-08 LAB — CMP (CANCER CENTER ONLY)
ALT: 44 U/L (ref 0–44)
AST: 46 U/L — ABNORMAL HIGH (ref 15–41)
Albumin: 3.7 g/dL (ref 3.5–5.0)
Alkaline Phosphatase: 176 U/L — ABNORMAL HIGH (ref 38–126)
Anion gap: 8 (ref 5–15)
BUN: 10 mg/dL (ref 8–23)
CO2: 25 mmol/L (ref 22–32)
Calcium: 8.6 mg/dL — ABNORMAL LOW (ref 8.9–10.3)
Chloride: 108 mmol/L (ref 98–111)
Creatinine: 0.91 mg/dL (ref 0.61–1.24)
GFR, Estimated: >60 mL/min (ref >60)
Glucose, Bld: 158 mg/dL — ABNORMAL HIGH (ref 70–99)
Potassium: 3.6 mmol/L (ref 3.5–5.1)
Sodium: 141 mmol/L (ref 135–145)
Total Bilirubin: 0.3 mg/dL (ref 0.3–1.2)
Total Protein: 6 g/dL — ABNORMAL LOW (ref 6.5–8.1)

## 2021-04-08 LAB — CBC WITH DIFFERENTIAL (CANCER CENTER ONLY)
Abs Immature Granulocytes: 0.33 10*3/uL — ABNORMAL HIGH (ref 0.00–0.07)
Basophils Absolute: 0.2 10*3/uL — ABNORMAL HIGH (ref 0.0–0.1)
Basophils Relative: 1 %
Eosinophils Absolute: 0.6 10*3/uL — ABNORMAL HIGH (ref 0.0–0.5)
Eosinophils Relative: 4 %
HCT: 32.9 % — ABNORMAL LOW (ref 39.0–52.0)
Hemoglobin: 10.7 g/dL — ABNORMAL LOW (ref 13.0–17.0)
Immature Granulocytes: 2 %
Lymphocytes Relative: 21 %
Lymphs Abs: 3 10*3/uL (ref 0.7–4.0)
MCH: 29.9 pg (ref 26.0–34.0)
MCHC: 32.5 g/dL (ref 30.0–36.0)
MCV: 91.9 fL (ref 80.0–100.0)
Monocytes Absolute: 1.3 10*3/uL — ABNORMAL HIGH (ref 0.1–1.0)
Monocytes Relative: 9 %
Neutro Abs: 9.2 10*3/uL — ABNORMAL HIGH (ref 1.7–7.7)
Neutrophils Relative %: 63 %
Platelet Count: 140 10*3/uL — ABNORMAL LOW (ref 150–400)
RBC: 3.58 MIL/uL — ABNORMAL LOW (ref 4.22–5.81)
RDW: 16.5 % — ABNORMAL HIGH (ref 11.5–15.5)
WBC Count: 14.7 10*3/uL — ABNORMAL HIGH (ref 4.0–10.5)
nRBC: 0 % (ref 0.0–0.2)

## 2021-04-08 MED ORDER — SODIUM CHLORIDE 0.9 % IV SOLN
400.0000 mg/m2 | Freq: Once | INTRAVENOUS | Status: AC
  Administered 2021-04-08: 708 mg via INTRAVENOUS
  Filled 2021-04-08: qty 25

## 2021-04-08 MED ORDER — ATROPINE SULFATE 1 MG/ML IV SOLN
0.5000 mg | Freq: Once | INTRAVENOUS | Status: AC | PRN
  Administered 2021-04-08: 0.5 mg via INTRAVENOUS
  Filled 2021-04-08: qty 1

## 2021-04-08 MED ORDER — DEXTROSE 5 % IV SOLN: Freq: Once | INTRAVENOUS | Status: AC

## 2021-04-08 MED ORDER — SODIUM CHLORIDE 0.9 % IV SOLN
150.0000 mg | Freq: Once | INTRAVENOUS | Status: AC
  Administered 2021-04-08: 150 mg via INTRAVENOUS
  Filled 2021-04-08: qty 150

## 2021-04-08 MED ORDER — OXALIPLATIN CHEMO INJECTION 100 MG/20ML
85.0000 mg/m2 | Freq: Once | INTRAVENOUS | Status: AC
  Administered 2021-04-08: 150 mg via INTRAVENOUS
  Filled 2021-04-08: qty 10

## 2021-04-08 MED ORDER — SODIUM CHLORIDE 0.9 % IV SOLN
10.0000 mg | Freq: Once | INTRAVENOUS | Status: AC
  Administered 2021-04-08: 10 mg via INTRAVENOUS
  Filled 2021-04-08: qty 10

## 2021-04-08 MED ORDER — SODIUM CHLORIDE 0.9 % IV SOLN
150.0000 mg/m2 | Freq: Once | INTRAVENOUS | Status: AC
  Administered 2021-04-08: 260 mg via INTRAVENOUS
  Filled 2021-04-08: qty 10

## 2021-04-08 MED ORDER — PALONOSETRON HCL INJECTION 0.25 MG/5ML
0.2500 mg | Freq: Once | INTRAVENOUS | Status: AC
  Administered 2021-04-08: 0.25 mg via INTRAVENOUS
  Filled 2021-04-08: qty 5

## 2021-04-08 MED ORDER — SODIUM CHLORIDE 0.9 % IV SOLN
2400.0000 mg/m2 | INTRAVENOUS | Status: DC
  Administered 2021-04-08: 4250 mg via INTRAVENOUS
  Filled 2021-04-08: qty 85

## 2021-04-08 NOTE — Patient Instructions (Addendum)
[place] [facility]   The chemotherapy medication bag should finish at 46 hours, 96 hours, or 7 days. For example, if your pump is scheduled for 46 hours and it was put on at 4:00 p.m., it should finish at 2:00 p.m. the day it is scheduled to come off regardless of your appointment time.     Estimated time to finish at 12:45 Friday, April 10, 2021.   If the display on your pump reads "Low Volume" and it is beeping, take the batteries out of the pump and come to the cancer center for it to be taken off.   If the pump alarms go off prior to the pump reading "Low Volume" then call [phone] and someone can assist you.  If the plunger comes out and the chemotherapy medication is leaking out, please use your home chemo spill kit to clean up the spill. Do NOT use paper towels or other household products.  If you have problems or questions regarding your pump, please call either [phone] (24 hours a day) or the cancer center Monday-Friday 8:00 a.m.- 4:30 p.m. at the clinic number and we will assist you. If you are unable to get assistance, then go to the nearest Emergency Department and ask the staff to contact the IV team for assistance.  Discharge Instructions: Thank you for choosing [facility] to provide your oncology and hematology care.   If you have a lab appointment with the [facility], please go directly to the [facility] and check in at the registration area.   Wear comfortable clothing and clothing appropriate for easy access to any Portacath or PICC line.   We strive to give you quality time with your provider. You may need to reschedule your appointment if you arrive late (15 or more minutes).  Arriving late affects you and other patients whose appointments are after yours.  Also, if you miss three or more appointments without notifying the office, you may be dismissed from the clinic at the providers discretion.      For prescription  refill requests, have your pharmacy contact our office and allow 72 hours for refills to be completed.    Today you received the following chemotherapy and/or immunotherapy agents Oxaliplatin, leucovorin, irinotecan, fluorouracil      To help prevent nausea and vomiting after your treatment, we encourage you to take your nausea medication as directed.  BELOW ARE SYMPTOMS THAT SHOULD BE REPORTED IMMEDIATELY: *FEVER GREATER THAN 100.4 F (38 C) OR HIGHER *CHILLS OR SWEATING *NAUSEA AND VOMITING THAT IS NOT CONTROLLED WITH YOUR NAUSEA MEDICATION *UNUSUAL SHORTNESS OF BREATH *UNUSUAL BRUISING OR BLEEDING *URINARY PROBLEMS (pain or burning when urinating, or frequent urination) *BOWEL PROBLEMS (unusual diarrhea, constipation, pain near the anus) TENDERNESS IN MOUTH AND THROAT WITH OR WITHOUT PRESENCE OF ULCERS (sore throat, sores in mouth, or a toothache) UNUSUAL RASH, SWELLING OR PAIN  UNUSUAL VAGINAL DISCHARGE OR ITCHING   Items with * indicate a potential emergency and should be followed up as soon as possible or go to the Emergency Department if any problems should occur.  Please show the CHEMOTHERAPY ALERT CARD or IMMUNOTHERAPY ALERT CARD at check-in to the Emergency Department and triage nurse.  Should you have questions after your visit or need to cancel or reschedule your appointment, please contact [facility]  Dept: [phone]  and follow the prompts.  Office hours are 8:00 a.m. to 4:30 p.m. Monday - Friday. Please note that  voicemails left after 4:00 p.m. may not be returned until the following business day.  We are closed weekends and major holidays. You have access to a nurse at all times for urgent questions. Please call the main number to the clinic Dept: [phone] and follow the prompts.   For any non-urgent questions, you may also contact your provider using MyChart. We now offer e-Visits for anyone 50 and older to request care online for  non-urgent symptoms. For details visit [email or web address].   Also download the MyChart app! Go to the app store, search "MyChart", open the app, select [facility], and log in with your MyChart username and password.  Due to Covid, a mask is required upon entering the hospital/clinic. If you do not have a mask, one will be given to you upon arrival. For doctor visits, patients may have 1 support person aged 58 or older with them. For treatment visits, patients cannot have anyone with them due to current Covid guidelines and our immunocompromised population.   Oxaliplatin Injection What is this medication? OXALIPLATIN (ox AL i PLA tin) is a chemotherapy drug. It targets fast dividing cells, like cancer cells, and causes these cells to die. This medicine is used to treat cancers of the colon and rectum, and many other cancers. This medicine may be used for other purposes; ask your health care provider or pharmacist if you have questions. COMMON BRAND NAME(S): Eloxatin What should I tell my care team before I take this medication? They need to know if you have any of these conditions: heart disease history of irregular heartbeat liver disease low blood counts, like white cells, platelets, or red blood cells lung or breathing disease, like asthma take medicines that treat or prevent blood clots tingling of the fingers or toes, or other nerve disorder an unusual or allergic reaction to oxaliplatin, other chemotherapy, other medicines, foods, dyes, or preservatives pregnant or trying to get pregnant breast-feeding How should I use this medication? This drug is given as an infusion into a vein. It is administered in a hospital or clinic by a specially trained health care professional. Talk to your pediatrician regarding the use of this medicine in children. Special care may be needed. Overdosage: If you think you have taken too much of this medicine contact a poison control center or emergency  room at once. NOTE: This medicine is only for you. Do not share this medicine with others. What if I miss a dose? It is important not to miss a dose. Call your doctor or health care professional if you are unable to keep an appointment. What may interact with this medication? Do not take this medicine with any of the following medications: cisapride dronedarone pimozide thioridazine This medicine may also interact with the following medications: aspirin and aspirin-like medicines certain medicines that treat or prevent blood clots like warfarin, apixaban, dabigatran, and rivaroxaban cisplatin cyclosporine diuretics medicines for infection like acyclovir, adefovir, amphotericin B, bacitracin, cidofovir, foscarnet, ganciclovir, gentamicin, pentamidine, vancomycin NSAIDs, medicines for pain and inflammation, like ibuprofen or naproxen other medicines that prolong the QT interval (an abnormal heart rhythm) pamidronate zoledronic acid This list may not describe all possible interactions. Give your health care provider a list of all the medicines, herbs, non-prescription drugs, or dietary supplements you use. Also tell them if you smoke, drink alcohol, or use illegal drugs. Some items may interact with your medicine. What should I watch for while using this medication? Your condition will be monitored carefully while you are receiving  this medicine. You may need blood work done while you are taking this medicine. This medicine may make you feel generally unwell. This is not uncommon as chemotherapy can affect healthy cells as well as cancer cells. Report any side effects. Continue your course of treatment even though you feel ill unless your healthcare professional tells you to stop. This medicine can make you more sensitive to cold. Do not drink cold drinks or use ice. Cover exposed skin before coming in contact with cold temperatures or cold objects. When out in cold weather wear warm clothing  and cover your mouth and nose to warm the air that goes into your lungs. Tell your doctor if you get sensitive to the cold. Do not become pregnant while taking this medicine or for 9 months after stopping it. Women should inform their health care professional if they wish to become pregnant or think they might be pregnant. Men should not father a child while taking this medicine and for 6 months after stopping it. There is potential for serious side effects to an unborn child. Talk to your health care professional for more information. Do not breast-feed a child while taking this medicine or for 3 months after stopping it. This medicine has caused ovarian failure in some women. This medicine may make it more difficult to get pregnant. Talk to your health care professional if you are concerned about your fertility. This medicine has caused decreased sperm counts in some men. This may make it more difficult to father a child. Talk to your health care professional if you are concerned about your fertility. This medicine may increase your risk of getting an infection. Call your health care professional for advice if you get a fever, chills, or sore throat, or other symptoms of a cold or flu. Do not treat yourself. Try to avoid being around people who are sick. Avoid taking medicines that contain aspirin, acetaminophen, ibuprofen, naproxen, or ketoprofen unless instructed by your health care professional. These medicines may hide a fever. Be careful brushing or flossing your teeth or using a toothpick because you may get an infection or bleed more easily. If you have any dental work done, tell your dentist you are receiving this medicine. What side effects may I notice from receiving this medication? Side effects that you should report to your doctor or health care professional as soon as possible: allergic reactions like skin rash, itching or hives, swelling of the face, lips, or tongue breathing  problems cough low blood counts - this medicine may decrease the number of white blood cells, red blood cells, and platelets. You may be at increased risk for infections and bleeding nausea, vomiting pain, redness, or irritation at site where injected pain, tingling, numbness in the hands or feet signs and symptoms of bleeding such as bloody or black, tarry stools; red or dark brown urine; spitting up blood or brown material that looks like coffee grounds; red spots on the skin; unusual bruising or bleeding from the eyes, gums, or nose signs and symptoms of a dangerous change in heartbeat or heart rhythm like chest pain; dizziness; fast, irregular heartbeat; palpitations; feeling faint or lightheaded; falls signs and symptoms of infection like fever; chills; cough; sore throat; pain or trouble passing urine signs and symptoms of liver injury like dark yellow or brown urine; general ill feeling or flu-like symptoms; light-colored stools; loss of appetite; nausea; right upper belly pain; unusually weak or tired; yellowing of the eyes or skin signs and symptoms  of low red blood cells or anemia such as unusually weak or tired; feeling faint or lightheaded; falls signs and symptoms of muscle injury like dark urine; trouble passing urine or change in the amount of urine; unusually weak or tired; muscle pain; back pain Side effects that usually do not require medical attention (report to your doctor or health care professional if they continue or are bothersome): changes in taste diarrhea gas hair loss loss of appetite mouth sores This list may not describe all possible side effects. Call your doctor for medical advice about side effects. You may report side effects to FDA at 1-800-FDA-1088. Where should I keep my medication? This drug is given in a hospital or clinic and will not be stored at home. NOTE: This sheet is a summary. It may not cover all possible information. If you have questions about  this medicine, talk to your doctor, pharmacist, or health care provider.  2022 Elsevier/Gold Standard (2020-11-25 00:00:00)  Irinotecan injection What is this medication? IRINOTECAN (ir in oh TEE kan ) is a chemotherapy drug. It is used to treat colon and rectal cancer. This medicine may be used for other purposes; ask your health care provider or pharmacist if you have questions. COMMON BRAND NAME(S): Camptosar What should I tell my care team before I take this medication? They need to know if you have any of these conditions: dehydration diarrhea infection (especially a virus infection such as chickenpox, cold sores, or herpes) liver disease low blood counts, like low white cell, platelet, or red cell counts low levels of calcium, magnesium, or potassium in the blood recent or ongoing radiation therapy an unusual or allergic reaction to irinotecan, other medicines, foods, dyes, or preservatives pregnant or trying to get pregnant breast-feeding How should I use this medication? This drug is given as an infusion into a vein. It is administered in a hospital or clinic by a specially trained health care professional. Talk to your pediatrician regarding the use of this medicine in children. Special care may be needed. Overdosage: If you think you have taken too much of this medicine contact a poison control center or emergency room at once. NOTE: This medicine is only for you. Do not share this medicine with others. What if I miss a dose? It is important not to miss your dose. Call your doctor or health care professional if you are unable to keep an appointment. What may interact with this medication? Do not take this medicine with any of the following medications: cobicistat itraconazole This medicine may interact with the following medications: antiviral medicines for HIV or AIDS certain antibiotics like rifampin or rifabutin certain medicines for fungal infections like  ketoconazole, posaconazole, and voriconazole certain medicines for seizures like carbamazepine, phenobarbital, phenotoin clarithromycin gemfibrozil nefazodone St. John's Wort This list may not describe all possible interactions. Give your health care provider a list of all the medicines, herbs, non-prescription drugs, or dietary supplements you use. Also tell them if you smoke, drink alcohol, or use illegal drugs. Some items may interact with your medicine. What should I watch for while using this medication? Your condition will be monitored carefully while you are receiving this medicine. You will need important blood work done while you are taking this medicine. This drug may make you feel generally unwell. This is not uncommon, as chemotherapy can affect healthy cells as well as cancer cells. Report any side effects. Continue your course of treatment even though you feel ill unless your doctor tells you  to stop. In some cases, you may be given additional medicines to help with side effects. Follow all directions for their use. You may get drowsy or dizzy. Do not drive, use machinery, or do anything that needs mental alertness until you know how this medicine affects you. Do not stand or sit up quickly, especially if you are an older patient. This reduces the risk of dizzy or fainting spells. Call your health care professional for advice if you get a fever, chills, or sore throat, or other symptoms of a cold or flu. Do not treat yourself. This medicine decreases your body's ability to fight infections. Try to avoid being around people who are sick. Avoid taking products that contain aspirin, acetaminophen, ibuprofen, naproxen, or ketoprofen unless instructed by your doctor. These medicines may hide a fever. This medicine may increase your risk to bruise or bleed. Call your doctor or health care professional if you notice any unusual bleeding. Be careful brushing and flossing your teeth or using a  toothpick because you may get an infection or bleed more easily. If you have any dental work done, tell your dentist you are receiving this medicine. Do not become pregnant while taking this medicine or for 6 months after stopping it. Women should inform their health care professional if they wish to become pregnant or think they might be pregnant. Men should not father a child while taking this medicine and for 3 months after stopping it. There is potential for serious side effects to an unborn child. Talk to your health care professional for more information. Do not breast-feed an infant while taking this medicine or for 7 days after stopping it. This medicine has caused ovarian failure in some women. This medicine may make it more difficult to get pregnant. Talk to your health care professional if you are concerned about your fertility. This medicine has caused decreased sperm counts in some men. This may make it more difficult to father a child. Talk to your health care professional if you are concerned about your fertility. What side effects may I notice from receiving this medication? Side effects that you should report to your doctor or health care professional as soon as possible: allergic reactions like skin rash, itching or hives, swelling of the face, lips, or tongue chest pain diarrhea flushing, runny nose, sweating during infusion low blood counts - this medicine may decrease the number of white blood cells, red blood cells and platelets. You may be at increased risk for infections and bleeding. nausea, vomiting pain, swelling, warmth in the leg signs of decreased platelets or bleeding - bruising, pinpoint red spots on the skin, black, tarry stools, blood in the urine signs of infection - fever or chills, cough, sore throat, pain or difficulty passing urine signs of decreased red blood cells - unusually weak or tired, fainting spells, lightheadedness Side effects that usually do not  require medical attention (report to your doctor or health care professional if they continue or are bothersome): constipation hair loss headache loss of appetite mouth sores stomach pain This list may not describe all possible side effects. Call your doctor for medical advice about side effects. You may report side effects to FDA at 1-800-FDA-1088. Where should I keep my medication? This drug is given in a hospital or clinic and will not be stored at home. NOTE: This sheet is a summary. It may not cover all possible information. If you have questions about this medicine, talk to your doctor, pharmacist, or health  care provider.  2022 Elsevier/Gold Standard (2020-11-25 00:00:00)  Leucovorin injection What is this medication? LEUCOVORIN (loo koe VOR in) is used to prevent or treat the harmful effects of some medicines. This medicine is used to treat anemia caused by a low amount of folic acid in the body. It is also used with 5-fluorouracil (5-FU) to treat colon cancer. This medicine may be used for other purposes; ask your health care provider or pharmacist if you have questions. What should I tell my care team before I take this medication? They need to know if you have any of these conditions: anemia from low levels of vitamin B-12 in the blood an unusual or allergic reaction to leucovorin, folic acid, other medicines, foods, dyes, or preservatives pregnant or trying to get pregnant breast-feeding How should I use this medication? This medicine is for injection into a muscle or into a vein. It is given by a health care professional in a hospital or clinic setting. Talk to your pediatrician regarding the use of this medicine in children. Special care may be needed. Overdosage: If you think you have taken too much of this medicine contact a poison control center or emergency room at once. NOTE: This medicine is only for you. Do not share this medicine with others. What if I miss a  dose? This does not apply. What may interact with this medication? capecitabine fluorouracil phenobarbital phenytoin primidone trimethoprim-sulfamethoxazole This list may not describe all possible interactions. Give your health care provider a list of all the medicines, herbs, non-prescription drugs, or dietary supplements you use. Also tell them if you smoke, drink alcohol, or use illegal drugs. Some items may interact with your medicine. What should I watch for while using this medication? Your condition will be monitored carefully while you are receiving this medicine. This medicine may increase the side effects of 5-fluorouracil, 5-FU. Tell your doctor or health care professional if you have diarrhea or mouth sores that do not get better or that get worse. What side effects may I notice from receiving this medication? Side effects that you should report to your doctor or health care professional as soon as possible: allergic reactions like skin rash, itching or hives, swelling of the face, lips, or tongue breathing problems fever, infection mouth sores unusual bleeding or bruising unusually weak or tired Side effects that usually do not require medical attention (report to your doctor or health care professional if they continue or are bothersome): constipation or diarrhea loss of appetite nausea, vomiting This list may not describe all possible side effects. Call your doctor for medical advice about side effects. You may report side effects to FDA at 1-800-FDA-1088. Where should I keep my medication? This drug is given in a hospital or clinic and will not be stored at home. NOTE: This sheet is a summary. It may not cover all possible information. If you have questions about this medicine, talk to your doctor, pharmacist, or health care provider.  2022 Elsevier/Gold Standard (2007-09-14 00:00:00)  Fluorouracil, 5-FU injection What is this medication? FLUOROURACIL, 5-FU (flure oh  YOOR a sil) is a chemotherapy drug. It slows the growth of cancer cells. This medicine is used to treat many types of cancer like breast cancer, colon or rectal cancer, pancreatic cancer, and stomach cancer. This medicine may be used for other purposes; ask your health care provider or pharmacist if you have questions. COMMON BRAND NAME(S): Adrucil What should I tell my care team before I take this medication? They need  to know if you have any of these conditions: blood disorders dihydropyrimidine dehydrogenase (DPD) deficiency infection (especially a virus infection such as chickenpox, cold sores, or herpes) kidney disease liver disease malnourished, poor nutrition recent or ongoing radiation therapy an unusual or allergic reaction to fluorouracil, other chemotherapy, other medicines, foods, dyes, or preservatives pregnant or trying to get pregnant breast-feeding How should I use this medication? This drug is given as an infusion or injection into a vein. It is administered in a hospital or clinic by a specially trained health care professional. Talk to your pediatrician regarding the use of this medicine in children. Special care may be needed. Overdosage: If you think you have taken too much of this medicine contact a poison control center or emergency room at once. NOTE: This medicine is only for you. Do not share this medicine with others. What if I miss a dose? It is important not to miss your dose. Call your doctor or health care professional if you are unable to keep an appointment. What may interact with this medication? Do not take this medicine with any of the following medications: live virus vaccines This medicine may also interact with the following medications: medicines that treat or prevent blood clots like warfarin, enoxaparin, and dalteparin This list may not describe all possible interactions. Give your health care provider a list of all the medicines, herbs,  non-prescription drugs, or dietary supplements you use. Also tell them if you smoke, drink alcohol, or use illegal drugs. Some items may interact with your medicine. What should I watch for while using this medication? Visit your doctor for checks on your progress. This drug may make you feel generally unwell. This is not uncommon, as chemotherapy can affect healthy cells as well as cancer cells. Report any side effects. Continue your course of treatment even though you feel ill unless your doctor tells you to stop. In some cases, you may be given additional medicines to help with side effects. Follow all directions for their use. Call your doctor or health care professional for advice if you get a fever, chills or sore throat, or other symptoms of a cold or flu. Do not treat yourself. This drug decreases your body's ability to fight infections. Try to avoid being around people who are sick. This medicine may increase your risk to bruise or bleed. Call your doctor or health care professional if you notice any unusual bleeding. Be careful brushing and flossing your teeth or using a toothpick because you may get an infection or bleed more easily. If you have any dental work done, tell your dentist you are receiving this medicine. Avoid taking products that contain aspirin, acetaminophen, ibuprofen, naproxen, or ketoprofen unless instructed by your doctor. These medicines may hide a fever. Do not become pregnant while taking this medicine. Women should inform their doctor if they wish to become pregnant or think they might be pregnant. There is a potential for serious side effects to an unborn child. Talk to your health care professional or pharmacist for more information. Do not breast-feed an infant while taking this medicine. Men should inform their doctor if they wish to father a child. This medicine may lower sperm counts. Do not treat diarrhea with over the counter products. Contact your doctor if you  have diarrhea that lasts more than 2 days or if it is severe and watery. This medicine can make you more sensitive to the sun. Keep out of the sun. If you cannot avoid being in  the sun, wear protective clothing and use sunscreen. Do not use sun lamps or tanning beds/booths. What side effects may I notice from receiving this medication? Side effects that you should report to your doctor or health care professional as soon as possible: allergic reactions like skin rash, itching or hives, swelling of the face, lips, or tongue low blood counts - this medicine may decrease the number of white blood cells, red blood cells and platelets. You may be at increased risk for infections and bleeding. signs of infection - fever or chills, cough, sore throat, pain or difficulty passing urine signs of decreased platelets or bleeding - bruising, pinpoint red spots on the skin, black, tarry stools, blood in the urine signs of decreased red blood cells - unusually weak or tired, fainting spells, lightheadedness breathing problems changes in vision chest pain mouth sores nausea and vomiting pain, swelling, redness at site where injected pain, tingling, numbness in the hands or feet redness, swelling, or sores on hands or feet stomach pain unusual bleeding Side effects that usually do not require medical attention (report to your doctor or health care professional if they continue or are bothersome): changes in finger or toe nails diarrhea dry or itchy skin hair loss headache loss of appetite sensitivity of eyes to the light stomach upset unusually teary eyes This list may not describe all possible side effects. Call your doctor for medical advice about side effects. You may report side effects to FDA at 1-800-FDA-1088. Where should I keep my medication? This drug is given in a hospital or clinic and will not be stored at home. NOTE: This sheet is a summary. It may not cover all possible information. If  you have questions about this medicine, talk to your doctor, pharmacist, or health care provider.  2022 [employer]vier/Gold Standard (2020-11-25 00:00:00)

## 2021-04-08 NOTE — Progress Notes (Signed)
Patient seen by Dr. Sherrill today ? ?Vitals are within treatment parameters. ? ?Labs reviewed by Dr. Sherrill and are within treatment parameters. ? ?Per physician team, patient is ready for treatment and there are NO modifications to the treatment plan.  ?

## 2021-04-08 NOTE — Progress Notes (Signed)
[facility] OFFICE PROGRESS NOTE   Diagnosis: Pancreas cancer  INTERVAL HISTORY:   Francisco Frank completed another cycle FOLFIRINOX on 03/25/2021.  No nausea/vomiting, mouth sores, or diarrhea.  He had malaise following chemotherapy.  He had prolonged cold sensitivity.  No neuropathy symptoms at present.  Objective:  Vital signs in last 24 hours:  Blood pressure 116/84, pulse 69, temperature 97.8 F (36.6 C), temperature source Oral, resp. rate 20, height 5\' 8"  (1.727 m), weight 151 lb (68.5 kg), SpO2 99 %.    HEENT: No thrush or ulcers Resp: Lungs clear bilaterally Cardio: Regular rate and rhythm GI: No mass, nontender, no hepatosplenomegaly Vascular: No leg edema Neuro: The vibratory sense is intact at the fingertips bilaterally   Portacath/PICC-without erythema  Lab Results:  Lab Results  Component Value Date   WBC 14.7 (H) 04/08/2021   HGB 10.7 (L) 04/08/2021   HCT 32.9 (L) 04/08/2021   MCV 91.9 04/08/2021   PLT 140 (L) 04/08/2021   NEUTROABS 9.2 (H) 04/08/2021    CMP  Lab Results  Component Value Date   NA 138 03/25/2021   K 4.3 03/25/2021   CL 106 03/25/2021   CO2 26 03/25/2021   GLUCOSE 247 (H) 03/25/2021   BUN 14 03/25/2021   CREATININE 0.86 03/25/2021   CALCIUM 8.8 (L) 03/25/2021   PROT 6.2 (L) 03/25/2021   ALBUMIN 3.7 03/25/2021   AST 34 03/25/2021   ALT 41 03/25/2021   ALKPHOS 133 (H) 03/25/2021   BILITOT 0.3 03/25/2021   GFRNONAA >60 03/25/2021    Lab Results  Component Value Date   PJA250 472 (H) 03/03/2021    Lab Results  Component Value Date   INR 1.1 12/13/2020   LABPROT 14.0 12/13/2020    Imaging:  CT PANCREAS ABD W/WO  Result Date: 04/06/2021 CLINICAL DATA:  Restaging pancreas neoplasm. EXAM: CT ABDOMEN WITHOUT AND WITH CONTRAST TECHNIQUE: Multidetector CT imaging of the abdomen was performed following the standard protocol before and following the bolus administration of intravenous contrast. RADIATION DOSE  REDUCTION: This exam was performed according to the departmental dose-optimization program which includes automated exposure control, adjustment of the mA and/or kV according to patient size and/or use of iterative reconstruction technique. CONTRAST:  43mL OMNIPAQUE IOHEXOL 300 MG/ML  SOLN COMPARISON:  11/28/2020 FINDINGS: Lower chest: No acute abnormality. Hepatobiliary: No suspicious liver lesion identified to suggest liver metastasis. Pneumobilia is identified with common bile duct stent in place. Pancreas: Atrophy with diffuse main duct dilatation is again noted involving the neck, body and tail of pancreas secondary to ill-defined mass within head of pancreas. Margins of this mass are difficult to visualize on today's study. Best estimate is that this measures approximately 3.4 x 3.0 cm, image 28/12. Formally this measured 3.5 x 2.8 cm. New small central area of cystic change identified within this mass measuring 1 cm, image 71/14. As noted previously there is partial encasement and narrowing of the portal venous confluence., image 82/16. Since the previous exam there is been interval development of nonocclusive thrombus the distal superior mesenteric vein, image 84/16. As before there is no signs of celiac artery or superior mesenteric artery encasement. Spleen: Normal in size without focal abnormality. Adrenals/Urinary Tract: Normal adrenal glands. No nephrolithiasis, hydronephrosis, or nephrolithiasis. Stomach/Bowel: Stomach appears nondistended. No bowel wall thickening, inflammation, or distension. Vascular/Lymphatic: Aortic atherosclerosis without aneurysm. Preaortic lymph node measures 0.7 cm, image 71/2. Previously 0.6 cm. Left periaortic lymph node measures 1.1 cm, image 75/14. Formally this measured 1.1 cm.  Other: There is no ascites or focal fluid collections. Musculoskeletal: Spondylosis identified within the lumbar spine. IMPRESSION: 1. No significant change in ill-defined mass involving the head  of pancreas which partially encases the portal venous confluence. 2. 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 3. Borderline upper abdominal lymph nodes are not significantly changed in the interval. 4. No signs of liver metastasis. 5. Aortic Atherosclerosis (ICD10-I70.0). Electronically Signed   By: Kerby Moors M.D.   On: 04/06/2021 10:51    Medications: I have reviewed the patient's current medications.   Assessment/Plan:  Pancreas cancer Outside CT-apparent pancreas abnormality (we do not have a copy of the report) MRI abdomen 11/06/2020-pancreatic body and tail atrophy with duct dilatation up to 9 mm.  Both ducts undergo an abrupt transition in the region of the pancreatic head.  Non border deforming pancreatic head mass measuring 3.2 x 2.6 cm.  No arterial involvement by tumor.  SMV adjacent to presumed tumor without encasement.  Portal vein uninvolved.  No abdominal adenopathy.  Left periaortic 7 mm node not pathologic by size criteria.  No ascites.  Subtle left-sided pericolonic nodule 5 mm. EUS/ERCP 11/11/2020-EGD showed gastritis which was biopsied, mucosal changes in the duodenum, mucosal changes in the duodenum sweep (gastric antral and oxyntic mucosa with no specific histopathologic changes; Warthin Starry stain negative for H pylori).  EUS showed an irregular mass in the pancreatic head measuring 26 mm x 30 mm; the outer margins were irregular.  There was sonographic evidence suggesting invasion into the portal vein (manifested by abutment) and the superior mesenteric vein (manifested by abutment); an intact interface was seen between the mass and the superior mesenteric artery and celiac trunk suggesting a lack of invasion.  Endosonographic imaging in the visualized portion of the liver showed no mass.  There was dilatation of the common bile duct and the common hepatic duct.  No malignant appearing lymph nodes were visualized in the celiac  region, peripancreatic region, porta hepatis region.  FNA biopsy of the pancreas head mass showed malignant cells consistent with adenocarcinoma.  Biliary access could not be obtained.  Cholangiogram 11/12/2020-severe intrahepatic biliary dilatation.  A peripheral right hepatic bile duct was successfully cannulated.  Internal/external biliary drain placed.  Cytology on biliary drain bile showed malignant cells consistent with adenocarcinoma.   CT chest 11/13/2020-no evidence of metastatic disease.   CT pelvis 11/13/2020-nonspecific circumferential wall thickening of the cecum measuring up to 12 mm in thickness.  Otherwise no evidence of intrapelvic metastases.   11/19/2020-biliary stent placement, biliary drain exchange 11/28/2020-exchange of internal/external biliary drain secondary to persistent jaundice CT abdomen/pelvis 11/28/2020-pancreas head mass, 3.5 x 2.8 cm, no encasement of the celiac trunk or SMA, partial encasement of the portal venous confluence-at least 180 degrees, partial encasement of the SMV-approximately 180 degrees, ventral extension of tumor abuts the posterior duodenum, no adenopathy, percutaneous biliary stent with decompression of previous dilated intrahepatic and common bile ducts Cycle 1 FOLFIRINOX 01/01/2021, irinotecan held Cycle 2 FOLFIRINOX 01/13/2021, irinotecan held Cycle 3 FOLFIRINOX 01/28/2021 Cycle 4 FOLFIRINOX 02/09/2021 Cycle 5 FOLFIRINOX 03/03/2021, Ziextenzo Cycle 6 FOLFIRINOX 03/25/2021, Ziextenzo CT abdomen/pelvis 04/06/2021-no change in pancreas head mass with partial encasement of the portal venous confluence, nonocclusive SMV thrombus, borderline upper abdominal lymph nodes-unchanged, no evidence of liver metastases Cycle 7 FOLFIRINOX 04/08/2021,Ziextenzo   Painless jaundice-internal/external biliary drain placed 11/12/2020.  Biliary stent placed and drain exchange 11/19/2020, exchange of internal/external drain 11/28/2020 Weight loss Diabetes diagnosed June 2022 Change  in bowel habits  February 2022 Colonoscopy 08/06/2020-8 mm polyp in the sigmoid colon, a few small and large mouth diverticula in the sigmoid colon, normal mucosa found in the entire colon with biopsies for histology taken with a cold forceps from the right colon and left colon for evaluation of microscopic colitis (right colon-colonic mucosa with no significant pathologic findings; left colon-colonic mucosa with no significant pathologic findings; sigmoid polyp-tubular adenoma, negative for high-grade dysplasia). Hypertension Admission 12/12/2020 with acute cholecystitis, cholecystostomy tube 12/17/2020     Disposition: Francisco Frank appears stable.  He continues to tolerate chemotherapy well.  I reviewed the restaging CT findings and images with Francisco. Frank and his Frank.  He continues to have borderline resectable disease.  I discussed the case with Dr. Zenia Resides.  She will see him to discuss the indication for resection of the pancreas mass.  He will not be placed on anticoagulation therapy for the SMV thrombus.  Francisco. Frank will return for an office visit and the final planned cycle of chemotherapy in 2 weeks.  Betsy Coder, MD  04/08/2021  9:03 AM

## 2021-04-08 NOTE — Progress Notes (Signed)
Patient presents for treatment. RN assessment completed along with the following:  Labs/vitals reviewed - Yes, and within treatment parameters.   Weight within 10% of previous measurement - Yes       Provider progress note reviewed - Today's provider note is not yet available. I reviewed the most recent oncology provider progress note in chart dated 03/25/21. Treatment/Antibody/Supportive plan reviewed - Yes, and there are no adjustments needed for today's treatment. S&H and other orders reviewed - Yes, and there are no additional orders identified. Previous treatment date reviewed - Yes, and the appropriate amount of time has elapsed between treatments. Clinic Hand Off Received from [facility], RN   Patient to proceed with treatment.

## 2021-04-09 LAB — CANCER ANTIGEN 19-9: CA 19-9: 933 U/mL — ABNORMAL HIGH (ref 0–35)

## 2021-04-10 ENCOUNTER — Other Ambulatory Visit: Payer: Self-pay

## 2021-04-10 ENCOUNTER — Inpatient Hospital Stay: Payer: Medicare Other

## 2021-04-10 VITALS — BP 124/80 | HR 62 | Temp 98.2°F | Resp 18

## 2021-04-10 DIAGNOSIS — Z5111 Encounter for antineoplastic chemotherapy: Secondary | ICD-10-CM | POA: Diagnosis not present

## 2021-04-10 DIAGNOSIS — C250 Malignant neoplasm of head of pancreas: Secondary | ICD-10-CM

## 2021-04-10 MED ORDER — PEGFILGRASTIM-BMEZ 6 MG/0.6ML ~~LOC~~ SOSY
6.0000 mg | PREFILLED_SYRINGE | Freq: Once | SUBCUTANEOUS | Status: AC
  Administered 2021-04-10: 6 mg via SUBCUTANEOUS
  Filled 2021-04-10: qty 0.6

## 2021-04-10 MED ORDER — HEPARIN SOD (PORK) LOCK FLUSH 100 UNIT/ML IV SOLN
500.0000 [IU] | Freq: Once | INTRAVENOUS | Status: AC | PRN
  Administered 2021-04-10: 500 [IU]

## 2021-04-10 MED ORDER — SODIUM CHLORIDE 0.9% FLUSH
10.0000 mL | INTRAVENOUS | Status: DC | PRN
  Administered 2021-04-10: 10 mL

## 2021-04-10 NOTE — Patient Instructions (Signed)

## 2021-04-19 ENCOUNTER — Other Ambulatory Visit: Payer: Self-pay | Admitting: Oncology

## 2021-04-22 ENCOUNTER — Inpatient Hospital Stay: Payer: Medicare Other

## 2021-04-22 ENCOUNTER — Inpatient Hospital Stay: Payer: Medicare Other | Admitting: Oncology

## 2021-04-22 ENCOUNTER — Other Ambulatory Visit: Payer: Self-pay

## 2021-04-22 ENCOUNTER — Encounter: Payer: Self-pay | Admitting: *Deleted

## 2021-04-22 ENCOUNTER — Inpatient Hospital Stay: Payer: Medicare Other | Attending: Nurse Practitioner

## 2021-04-22 VITALS — BP 126/78 | HR 66 | Temp 98.7°F | Resp 18 | Ht 68.0 in | Wt 150.6 lb

## 2021-04-22 DIAGNOSIS — C250 Malignant neoplasm of head of pancreas: Secondary | ICD-10-CM | POA: Diagnosis present

## 2021-04-22 DIAGNOSIS — Z5111 Encounter for antineoplastic chemotherapy: Secondary | ICD-10-CM | POA: Insufficient documentation

## 2021-04-22 DIAGNOSIS — Z5189 Encounter for other specified aftercare: Secondary | ICD-10-CM | POA: Diagnosis not present

## 2021-04-22 LAB — CMP (CANCER CENTER ONLY)
ALT: 37 U/L (ref 0–44)
AST: 31 U/L (ref 15–41)
Albumin: 3.6 g/dL (ref 3.5–5.0)
Alkaline Phosphatase: 186 U/L — ABNORMAL HIGH (ref 38–126)
Anion gap: 6 (ref 5–15)
BUN: 8 mg/dL (ref 8–23)
CO2: 25 mmol/L (ref 22–32)
Calcium: 8.5 mg/dL — ABNORMAL LOW (ref 8.9–10.3)
Chloride: 112 mmol/L — ABNORMAL HIGH (ref 98–111)
Creatinine: 0.82 mg/dL (ref 0.61–1.24)
GFR, Estimated: >60 mL/min (ref >60)
Glucose, Bld: 155 mg/dL — ABNORMAL HIGH (ref 70–99)
Potassium: 3.3 mmol/L — ABNORMAL LOW (ref 3.5–5.1)
Sodium: 143 mmol/L (ref 135–145)
Total Bilirubin: 0.3 mg/dL (ref 0.3–1.2)
Total Protein: 5.9 g/dL — ABNORMAL LOW (ref 6.5–8.1)

## 2021-04-22 LAB — CBC WITH DIFFERENTIAL (CANCER CENTER ONLY)
Abs Immature Granulocytes: 0.66 10*3/uL — ABNORMAL HIGH (ref 0.00–0.07)
Basophils Absolute: 0.1 10*3/uL (ref 0.0–0.1)
Basophils Relative: 1 %
Eosinophils Absolute: 0.7 10*3/uL — ABNORMAL HIGH (ref 0.0–0.5)
Eosinophils Relative: 4 %
HCT: 32.4 % — ABNORMAL LOW (ref 39.0–52.0)
Hemoglobin: 10.6 g/dL — ABNORMAL LOW (ref 13.0–17.0)
Immature Granulocytes: 4 %
Lymphocytes Relative: 17 %
Lymphs Abs: 3.2 10*3/uL (ref 0.7–4.0)
MCH: 29.5 pg (ref 26.0–34.0)
MCHC: 32.7 g/dL (ref 30.0–36.0)
MCV: 90.3 fL (ref 80.0–100.0)
Monocytes Absolute: 1.8 10*3/uL — ABNORMAL HIGH (ref 0.1–1.0)
Monocytes Relative: 10 %
Neutro Abs: 12 10*3/uL — ABNORMAL HIGH (ref 1.7–7.7)
Neutrophils Relative %: 64 %
Platelet Count: 161 10*3/uL (ref 150–400)
RBC: 3.59 MIL/uL — ABNORMAL LOW (ref 4.22–5.81)
RDW: 16.8 % — ABNORMAL HIGH (ref 11.5–15.5)
WBC Count: 18.4 10*3/uL — ABNORMAL HIGH (ref 4.0–10.5)
nRBC: 0 % (ref 0.0–0.2)

## 2021-04-22 MED ORDER — PALONOSETRON HCL INJECTION 0.25 MG/5ML
0.2500 mg | Freq: Once | INTRAVENOUS | Status: AC
  Administered 2021-04-22: 0.25 mg via INTRAVENOUS
  Filled 2021-04-22: qty 5

## 2021-04-22 MED ORDER — ATROPINE SULFATE 1 MG/ML IV SOLN
0.5000 mg | Freq: Once | INTRAVENOUS | Status: AC | PRN
  Administered 2021-04-22: 0.5 mg via INTRAVENOUS
  Filled 2021-04-22: qty 1

## 2021-04-22 MED ORDER — POTASSIUM CHLORIDE CRYS ER 20 MEQ PO TBCR: 20.0000 meq | EXTENDED_RELEASE_TABLET | Freq: Every day | ORAL | 2 refills | Status: DC

## 2021-04-22 MED ORDER — SODIUM CHLORIDE 0.9 % IV SOLN
150.0000 mg/m2 | Freq: Once | INTRAVENOUS | Status: AC
  Administered 2021-04-22: 260 mg via INTRAVENOUS
  Filled 2021-04-22: qty 10

## 2021-04-22 MED ORDER — SODIUM CHLORIDE 0.9 % IV SOLN
150.0000 mg | Freq: Once | INTRAVENOUS | Status: AC
  Administered 2021-04-22: 150 mg via INTRAVENOUS
  Filled 2021-04-22: qty 5

## 2021-04-22 MED ORDER — SODIUM CHLORIDE 0.9 % IV SOLN
2400.0000 mg/m2 | INTRAVENOUS | Status: DC
  Administered 2021-04-22: 4250 mg via INTRAVENOUS
  Filled 2021-04-22: qty 85

## 2021-04-22 MED ORDER — SODIUM CHLORIDE 0.9 % IV SOLN
400.0000 mg/m2 | Freq: Once | INTRAVENOUS | Status: AC
  Administered 2021-04-22: 708 mg via INTRAVENOUS
  Filled 2021-04-22: qty 35.4

## 2021-04-22 MED ORDER — DEXTROSE 5 % IV SOLN: Freq: Once | INTRAVENOUS | Status: AC

## 2021-04-22 MED ORDER — OXALIPLATIN CHEMO INJECTION 100 MG/20ML
85.0000 mg/m2 | Freq: Once | INTRAVENOUS | Status: AC
  Administered 2021-04-22: 150 mg via INTRAVENOUS
  Filled 2021-04-22: qty 20

## 2021-04-22 MED ORDER — SODIUM CHLORIDE 0.9 % IV SOLN
10.0000 mg | Freq: Once | INTRAVENOUS | Status: AC
  Administered 2021-04-22: 10 mg via INTRAVENOUS
  Filled 2021-04-22: qty 1

## 2021-04-22 NOTE — Patient Instructions (Addendum)
[facility]   Discharge Instructions: Thank you for choosing [facility] to provide your oncology and hematology care.   If you have a lab appointment with the [facility], please go directly to the [facility] and check in at the registration area.   Wear comfortable clothing and clothing appropriate for easy access to any Portacath or PICC line.   We strive to give you quality time with your provider. You may need to reschedule your appointment if you arrive late (15 or more minutes).  Arriving late affects you and other patients whose appointments are after yours.  Also, if you miss three or more appointments without notifying the office, you may be dismissed from the clinic at the providers discretion.      For prescription refill requests, have your pharmacy contact our office and allow 72 hours for refills to be completed.    Today you received the following chemotherapy and/or immunotherapy agents Oxaliplatin (ELOXATIN), Irinotecan (CAMPTOSAR), Leucovorin & Flourouracil (ADRUCIL).      To help prevent nausea and vomiting after your treatment, we encourage you to take your nausea medication as directed.  BELOW ARE SYMPTOMS THAT SHOULD BE REPORTED IMMEDIATELY: *FEVER GREATER THAN 100.4 F (38 C) OR HIGHER *CHILLS OR SWEATING *NAUSEA AND VOMITING THAT IS NOT CONTROLLED WITH YOUR NAUSEA MEDICATION *UNUSUAL SHORTNESS OF BREATH *UNUSUAL BRUISING OR BLEEDING *URINARY PROBLEMS (pain or burning when urinating, or frequent urination) *BOWEL PROBLEMS (unusual diarrhea, constipation, pain near the anus) TENDERNESS IN MOUTH AND THROAT WITH OR WITHOUT PRESENCE OF ULCERS (sore throat, sores in mouth, or a toothache) UNUSUAL RASH, SWELLING OR PAIN  UNUSUAL VAGINAL DISCHARGE OR ITCHING   Items with * indicate a potential emergency and should be followed up as soon as possible or go to the Emergency Department if any problems should occur.  Please  show the CHEMOTHERAPY ALERT CARD or IMMUNOTHERAPY ALERT CARD at check-in to the Emergency Department and triage nurse.  Should you have questions after your visit or need to cancel or reschedule your appointment, please contact [facility]  Dept: [phone]  and follow the prompts.  Office hours are 8:00 a.m. to 4:30 p.m. Monday - Friday. Please note that voicemails left after 4:00 p.m. may not be returned until the following business day.  We are closed weekends and major holidays. You have access to a nurse at all times for urgent questions. Please call the main number to the clinic Dept: [phone] and follow the prompts.   For any non-urgent questions, you may also contact your provider using MyChart. We now offer e-Visits for anyone 23 and older to request care online for non-urgent symptoms. For details visit [email or web address].   Also download the MyChart app! Go to the app store, search "MyChart", open the app, select , and log in with your MyChart username and password.  Due to Covid, a mask is required upon entering the hospital/clinic. If you do not have a mask, one will be given to you upon arrival. For doctor visits, patients may have 1 support person aged 70 or older with them. For treatment visits, patients cannot have anyone with them due to current Covid guidelines and our immunocompromised population.   Oxaliplatin Injection What is this medication? OXALIPLATIN (ox AL i PLA tin) is a chemotherapy drug. It targets fast dividing cells, like cancer cells, and causes these cells to die. This medicine is used to treat cancers of the colon and  rectum, and many other cancers. This medicine may be used for other purposes; ask your health care provider or pharmacist if you have questions. COMMON BRAND NAME(S): Eloxatin What should I tell my care team before I take this medication? They need to know if you have any of these conditions: heart  disease history of irregular heartbeat liver disease low blood counts, like white cells, platelets, or red blood cells lung or breathing disease, like asthma take medicines that treat or prevent blood clots tingling of the fingers or toes, or other nerve disorder an unusual or allergic reaction to oxaliplatin, other chemotherapy, other medicines, foods, dyes, or preservatives pregnant or trying to get pregnant breast-feeding How should I use this medication? This drug is given as an infusion into a vein. It is administered in a hospital or clinic by a specially trained health care professional. Talk to your pediatrician regarding the use of this medicine in children. Special care may be needed. Overdosage: If you think you have taken too much of this medicine contact a poison control center or emergency room at once. NOTE: This medicine is only for you. Do not share this medicine with others. What if I miss a dose? It is important not to miss a dose. Call your doctor or health care professional if you are unable to keep an appointment. What may interact with this medication? Do not take this medicine with any of the following medications: cisapride dronedarone pimozide thioridazine This medicine may also interact with the following medications: aspirin and aspirin-like medicines certain medicines that treat or prevent blood clots like warfarin, apixaban, dabigatran, and rivaroxaban cisplatin cyclosporine diuretics medicines for infection like acyclovir, adefovir, amphotericin B, bacitracin, cidofovir, foscarnet, ganciclovir, gentamicin, pentamidine, vancomycin NSAIDs, medicines for pain and inflammation, like ibuprofen or naproxen other medicines that prolong the QT interval (an abnormal heart rhythm) pamidronate zoledronic acid This list may not describe all possible interactions. Give your health care provider a list of all the medicines, herbs, non-prescription drugs, or dietary  supplements you use. Also tell them if you smoke, drink alcohol, or use illegal drugs. Some items may interact with your medicine. What should I watch for while using this medication? Your condition will be monitored carefully while you are receiving this medicine. You may need blood work done while you are taking this medicine. This medicine may make you feel generally unwell. This is not uncommon as chemotherapy can affect healthy cells as well as cancer cells. Report any side effects. Continue your course of treatment even though you feel ill unless your healthcare professional tells you to stop. This medicine can make you more sensitive to cold. Do not drink cold drinks or use ice. Cover exposed skin before coming in contact with cold temperatures or cold objects. When out in cold weather wear warm clothing and cover your mouth and nose to warm the air that goes into your lungs. Tell your doctor if you get sensitive to the cold. Do not become pregnant while taking this medicine or for 9 months after stopping it. Women should inform their health care professional if they wish to become pregnant or think they might be pregnant. Men should not father a child while taking this medicine and for 6 months after stopping it. There is potential for serious side effects to an unborn child. Talk to your health care professional for more information. Do not breast-feed a child while taking this medicine or for 3 months after stopping it. This medicine has caused ovarian  failure in some women. This medicine may make it more difficult to get pregnant. Talk to your health care professional if you are concerned about your fertility. This medicine has caused decreased sperm counts in some men. This may make it more difficult to father a child. Talk to your health care professional if you are concerned about your fertility. This medicine may increase your risk of getting an infection. Call your health care professional  for advice if you get a fever, chills, or sore throat, or other symptoms of a cold or flu. Do not treat yourself. Try to avoid being around people who are sick. Avoid taking medicines that contain aspirin, acetaminophen, ibuprofen, naproxen, or ketoprofen unless instructed by your health care professional. These medicines may hide a fever. Be careful brushing or flossing your teeth or using a toothpick because you may get an infection or bleed more easily. If you have any dental work done, tell your dentist you are receiving this medicine. What side effects may I notice from receiving this medication? Side effects that you should report to your doctor or health care professional as soon as possible: allergic reactions like skin rash, itching or hives, swelling of the face, lips, or tongue breathing problems cough low blood counts - this medicine may decrease the number of white blood cells, red blood cells, and platelets. You may be at increased risk for infections and bleeding nausea, vomiting pain, redness, or irritation at site where injected pain, tingling, numbness in the hands or feet signs and symptoms of bleeding such as bloody or black, tarry stools; red or dark brown urine; spitting up blood or brown material that looks like coffee grounds; red spots on the skin; unusual bruising or bleeding from the eyes, gums, or nose signs and symptoms of a dangerous change in heartbeat or heart rhythm like chest pain; dizziness; fast, irregular heartbeat; palpitations; feeling faint or lightheaded; falls signs and symptoms of infection like fever; chills; cough; sore throat; pain or trouble passing urine signs and symptoms of liver injury like dark yellow or brown urine; general ill feeling or flu-like symptoms; light-colored stools; loss of appetite; nausea; right upper belly pain; unusually weak or tired; yellowing of the eyes or skin signs and symptoms of low red blood cells or anemia such as  unusually weak or tired; feeling faint or lightheaded; falls signs and symptoms of muscle injury like dark urine; trouble passing urine or change in the amount of urine; unusually weak or tired; muscle pain; back pain Side effects that usually do not require medical attention (report to your doctor or health care professional if they continue or are bothersome): changes in taste diarrhea gas hair loss loss of appetite mouth sores This list may not describe all possible side effects. Call your doctor for medical advice about side effects. You may report side effects to FDA at 1-800-FDA-1088. Where should I keep my medication? This drug is given in a hospital or clinic and will not be stored at home. NOTE: This sheet is a summary. It may not cover all possible information. If you have questions about this medicine, talk to your doctor, pharmacist, or health care provider.  2022 Elsevier/Gold Standard (2020-11-25 00:00:00)  Irinotecan injection What is this medication? IRINOTECAN (ir in oh TEE kan ) is a chemotherapy drug. It is used to treat colon and rectal cancer. This medicine may be used for other purposes; ask your health care provider or pharmacist if you have questions. COMMON BRAND NAME(S): Camptosar  What should I tell my care team before I take this medication? They need to know if you have any of these conditions: dehydration diarrhea infection (especially a virus infection such as chickenpox, cold sores, or herpes) liver disease low blood counts, like low white cell, platelet, or red cell counts low levels of calcium, magnesium, or potassium in the blood recent or ongoing radiation therapy an unusual or allergic reaction to irinotecan, other medicines, foods, dyes, or preservatives pregnant or trying to get pregnant breast-feeding How should I use this medication? This drug is given as an infusion into a vein. It is administered in a hospital or clinic by a specially  trained health care professional. Talk to your pediatrician regarding the use of this medicine in children. Special care may be needed. Overdosage: If you think you have taken too much of this medicine contact a poison control center or emergency room at once. NOTE: This medicine is only for you. Do not share this medicine with others. What if I miss a dose? It is important not to miss your dose. Call your doctor or health care professional if you are unable to keep an appointment. What may interact with this medication? Do not take this medicine with any of the following medications: cobicistat itraconazole This medicine may interact with the following medications: antiviral medicines for HIV or AIDS certain antibiotics like rifampin or rifabutin certain medicines for fungal infections like ketoconazole, posaconazole, and voriconazole certain medicines for seizures like carbamazepine, phenobarbital, phenotoin clarithromycin gemfibrozil nefazodone [facility]. John's Wort This list may not describe all possible interactions. Give your health care provider a list of all the medicines, herbs, non-prescription drugs, or dietary supplements you use. Also tell them if you smoke, drink alcohol, or use illegal drugs. Some items may interact with your medicine. What should I watch for while using this medication? Your condition will be monitored carefully while you are receiving this medicine. You will need important blood work done while you are taking this medicine. This drug may make you feel generally unwell. This is not uncommon, as chemotherapy can affect healthy cells as well as cancer cells. Report any side effects. Continue your course of treatment even though you feel ill unless your doctor tells you to stop. In some cases, you may be given additional medicines to help with side effects. Follow all directions for their use. You may get drowsy or dizzy. Do not drive, use machinery, or do anything  that needs mental alertness until you know how this medicine affects you. Do not stand or sit up quickly, especially if you are an older patient. This reduces the risk of dizzy or fainting spells. Call your health care professional for advice if you get a fever, chills, or sore throat, or other symptoms of a cold or flu. Do not treat yourself. This medicine decreases your body's ability to fight infections. Try to avoid being around people who are sick. Avoid taking products that contain aspirin, acetaminophen, ibuprofen, naproxen, or ketoprofen unless instructed by your doctor. These medicines may hide a fever. This medicine may increase your risk to bruise or bleed. Call your doctor or health care professional if you notice any unusual bleeding. Be careful brushing and flossing your teeth or using a toothpick because you may get an infection or bleed more easily. If you have any dental work done, tell your dentist you are receiving this medicine. Do not become pregnant while taking this medicine or for 6 months after stopping it. Women  should inform their health care professional if they wish to become pregnant or think they might be pregnant. Men should not father a child while taking this medicine and for 3 months after stopping it. There is potential for serious side effects to an unborn child. Talk to your health care professional for more information. Do not breast-feed an infant while taking this medicine or for 7 days after stopping it. This medicine has caused ovarian failure in some women. This medicine may make it more difficult to get pregnant. Talk to your health care professional if you are concerned about your fertility. This medicine has caused decreased sperm counts in some men. This may make it more difficult to father a child. Talk to your health care professional if you are concerned about your fertility. What side effects may I notice from receiving this medication? Side effects that  you should report to your doctor or health care professional as soon as possible: allergic reactions like skin rash, itching or hives, swelling of the face, lips, or tongue chest pain diarrhea flushing, runny nose, sweating during infusion low blood counts - this medicine may decrease the number of white blood cells, red blood cells and platelets. You may be at increased risk for infections and bleeding. nausea, vomiting pain, swelling, warmth in the leg signs of decreased platelets or bleeding - bruising, pinpoint red spots on the skin, black, tarry stools, blood in the urine signs of infection - fever or chills, cough, sore throat, pain or difficulty passing urine signs of decreased red blood cells - unusually weak or tired, fainting spells, lightheadedness Side effects that usually do not require medical attention (report to your doctor or health care professional if they continue or are bothersome): constipation hair loss headache loss of appetite mouth sores stomach pain This list may not describe all possible side effects. Call your doctor for medical advice about side effects. You may report side effects to FDA at 1-800-FDA-1088. Where should I keep my medication? This drug is given in a hospital or clinic and will not be stored at home. NOTE: This sheet is a summary. It may not cover all possible information. If you have questions about this medicine, talk to your doctor, pharmacist, or health care provider.  2022 Elsevier/Gold Standard (2020-11-25 00:00:00)   Leucovorin injection What is this medication? LEUCOVORIN (loo koe VOR in) is used to prevent or treat the harmful effects of some medicines. This medicine is used to treat anemia caused by a low amount of folic acid in the body. It is also used with 5-fluorouracil (5-FU) to treat colon cancer. This medicine may be used for other purposes; ask your health care provider or pharmacist if you have questions. What should I tell  my care team before I take this medication? They need to know if you have any of these conditions: anemia from low levels of vitamin B-12 in the blood an unusual or allergic reaction to leucovorin, folic acid, other medicines, foods, dyes, or preservatives pregnant or trying to get pregnant breast-feeding How should I use this medication? This medicine is for injection into a muscle or into a vein. It is given by a health care professional in a hospital or clinic setting. Talk to your pediatrician regarding the use of this medicine in children. Special care may be needed. Overdosage: If you think you have taken too much of this medicine contact a poison control center or emergency room at once. NOTE: This medicine is only for you.  Do not share this medicine with others. What if I miss a dose? This does not apply. What may interact with this medication? capecitabine fluorouracil phenobarbital phenytoin primidone trimethoprim-sulfamethoxazole This list may not describe all possible interactions. Give your health care provider a list of all the medicines, herbs, non-prescription drugs, or dietary supplements you use. Also tell them if you smoke, drink alcohol, or use illegal drugs. Some items may interact with your medicine. What should I watch for while using this medication? Your condition will be monitored carefully while you are receiving this medicine. This medicine may increase the side effects of 5-fluorouracil, 5-FU. Tell your doctor or health care professional if you have diarrhea or mouth sores that do not get better or that get worse. What side effects may I notice from receiving this medication? Side effects that you should report to your doctor or health care professional as soon as possible: allergic reactions like skin rash, itching or hives, swelling of the face, lips, or tongue breathing problems fever, infection mouth sores unusual bleeding or bruising unusually weak or  tired Side effects that usually do not require medical attention (report to your doctor or health care professional if they continue or are bothersome): constipation or diarrhea loss of appetite nausea, vomiting This list may not describe all possible side effects. Call your doctor for medical advice about side effects. You may report side effects to FDA at 1-800-FDA-1088. Where should I keep my medication? This drug is given in a hospital or clinic and will not be stored at home. NOTE: This sheet is a summary. It may not cover all possible information. If you have questions about this medicine, talk to your doctor, pharmacist, or health care provider.  2022 Elsevier/Gold Standard (2007-09-14 00:00:00)  Fluorouracil, 5-FU injection What is this medication? FLUOROURACIL, 5-FU (flure oh YOOR a sil) is a chemotherapy drug. It slows the growth of cancer cells. This medicine is used to treat many types of cancer like breast cancer, colon or rectal cancer, pancreatic cancer, and stomach cancer. This medicine may be used for other purposes; ask your health care provider or pharmacist if you have questions. COMMON BRAND NAME(S): Adrucil What should I tell my care team before I take this medication? They need to know if you have any of these conditions: blood disorders dihydropyrimidine dehydrogenase (DPD) deficiency infection (especially a virus infection such as chickenpox, cold sores, or herpes) kidney disease liver disease malnourished, poor nutrition recent or ongoing radiation therapy an unusual or allergic reaction to fluorouracil, other chemotherapy, other medicines, foods, dyes, or preservatives pregnant or trying to get pregnant breast-feeding How should I use this medication? This drug is given as an infusion or injection into a vein. It is administered in a hospital or clinic by a specially trained health care professional. Talk to your pediatrician regarding the use of this  medicine in children. Special care may be needed. Overdosage: If you think you have taken too much of this medicine contact a poison control center or emergency room at once. NOTE: This medicine is only for you. Do not share this medicine with others. What if I miss a dose? It is important not to miss your dose. Call your doctor or health care professional if you are unable to keep an appointment. What may interact with this medication? Do not take this medicine with any of the following medications: live virus vaccines This medicine may also interact with the following medications: medicines that treat or prevent blood clots like  warfarin, enoxaparin, and dalteparin This list may not describe all possible interactions. Give your health care provider a list of all the medicines, herbs, non-prescription drugs, or dietary supplements you use. Also tell them if you smoke, drink alcohol, or use illegal drugs. Some items may interact with your medicine. What should I watch for while using this medication? Visit your doctor for checks on your progress. This drug may make you feel generally unwell. This is not uncommon, as chemotherapy can affect healthy cells as well as cancer cells. Report any side effects. Continue your course of treatment even though you feel ill unless your doctor tells you to stop. In some cases, you may be given additional medicines to help with side effects. Follow all directions for their use. Call your doctor or health care professional for advice if you get a fever, chills or sore throat, or other symptoms of a cold or flu. Do not treat yourself. This drug decreases your body's ability to fight infections. Try to avoid being around people who are sick. This medicine may increase your risk to bruise or bleed. Call your doctor or health care professional if you notice any unusual bleeding. Be careful brushing and flossing your teeth or using a toothpick because you may get an  infection or bleed more easily. If you have any dental work done, tell your dentist you are receiving this medicine. Avoid taking products that contain aspirin, acetaminophen, ibuprofen, naproxen, or ketoprofen unless instructed by your doctor. These medicines may hide a fever. Do not become pregnant while taking this medicine. Women should inform their doctor if they wish to become pregnant or think they might be pregnant. There is a potential for serious side effects to an unborn child. Talk to your health care professional or pharmacist for more information. Do not breast-feed an infant while taking this medicine. Men should inform their doctor if they wish to father a child. This medicine may lower sperm counts. Do not treat diarrhea with over the counter products. Contact your doctor if you have diarrhea that lasts more than 2 days or if it is severe and watery. This medicine can make you more sensitive to the sun. Keep out of the sun. If you cannot avoid being in the sun, wear protective clothing and use sunscreen. Do not use sun lamps or tanning beds/booths. What side effects may I notice from receiving this medication? Side effects that you should report to your doctor or health care professional as soon as possible: allergic reactions like skin rash, itching or hives, swelling of the face, lips, or tongue low blood counts - this medicine may decrease the number of white blood cells, red blood cells and platelets. You may be at increased risk for infections and bleeding. signs of infection - fever or chills, cough, sore throat, pain or difficulty passing urine signs of decreased platelets or bleeding - bruising, pinpoint red spots on the skin, black, tarry stools, blood in the urine signs of decreased red blood cells - unusually weak or tired, fainting spells, lightheadedness breathing problems changes in vision chest pain mouth sores nausea and vomiting pain, swelling, redness at site  where injected pain, tingling, numbness in the hands or feet redness, swelling, or sores on hands or feet stomach pain unusual bleeding Side effects that usually do not require medical attention (report to your doctor or health care professional if they continue or are bothersome): changes in finger or toe nails diarrhea dry or itchy skin hair loss headache  loss of appetite sensitivity of eyes to the light stomach upset unusually teary eyes This list may not describe all possible side effects. Call your doctor for medical advice about side effects. You may report side effects to FDA at 1-800-FDA-1088. Where should I keep my medication? This drug is given in a hospital or clinic and will not be stored at home. NOTE: This sheet is a summary. It may not cover all possible information. If you have questions about this medicine, talk to your doctor, pharmacist, or health care provider.  2022 Elsevier/Gold Standard (2020-11-25 00:00:00)  The chemotherapy medication bag should finish at 46 hours, 96 hours, or 7 days. For example, if your pump is scheduled for 46 hours and it was put on at 4:00 p.m., it should finish at 2:00 p.m. the day it is scheduled to come off regardless of your appointment time.     Estimated time to finish at 1:00 p.m. on Friday 04/24/2021.   If the display on your pump reads "Low Volume" and it is beeping, take the batteries out of the pump and come to the cancer center for it to be taken off.   If the pump alarms go off prior to the pump reading "Low Volume" then call [phone] and someone can assist you.  If the plunger comes out and the chemotherapy medication is leaking out, please use your home chemo spill kit to clean up the spill. Do NOT use paper towels or other household products.  If you have problems or questions regarding your pump, please call either [phone] (24 hours a day) or the cancer center Monday-Friday 8:00 a.m.- 4:30 p.m. at the clinic  number and we will assist you. If you are unable to get assistance, then go to the nearest Emergency Department and ask the staff to contact the IV team for assistance.

## 2021-04-22 NOTE — Patient Instructions (Signed)
Your potassium is low. Begin taking potassium chloride 20 meq daily. This has been sent to your [facility] in [place].

## 2021-04-22 NOTE — Progress Notes (Signed)
[place] OFFICE PROGRESS NOTE   Diagnosis: Pancreas cancer  INTERVAL HISTORY:   Francisco Frank returns as scheduled.  He completed another cycle of FOLFIRINOX 04/08/2021.  No nausea/vomiting.  He has prolonged cold sensitivity.  He noticed tingling in the feet.  No numbness.  No difficulty with fine motor activity. He saw Dr. Zenia Resides.  He is being referred to Dr. Hyman Hopes at [facility] to consider resection of the pancreas mass.  He has an appointment on 05/01/2021.  He complains of increased flatulence.  He has greater than 2 bowel movements per day.  Objective:  Vital signs in last 24 hours:  Blood pressure 126/78, pulse 66, temperature 98.7 F (37.1 C), temperature source Oral, resp. rate 18, height 5\' 8"  (1.727 m), weight 150 lb 9.6 oz (68.3 kg), SpO2 98 %.    HEENT: No thrush or ulcers Resp: Lungs clear bilaterally Cardio: Regular rate and rhythm GI: No hepatosplenomegaly, no mass, nontender Vascular: No leg edema Neuro: Very mild loss of vibratory sense at the fingertips bilaterally    Portacath/PICC-without erythema  Lab Results:  Lab Results  Component Value Date   WBC 18.4 (H) 04/22/2021   HGB 10.6 (L) 04/22/2021   HCT 32.4 (L) 04/22/2021   MCV 90.3 04/22/2021   PLT 161 04/22/2021   NEUTROABS 12.0 (H) 04/22/2021    CMP  Lab Results  Component Value Date   NA 143 04/22/2021   K 3.3 (L) 04/22/2021   CL 112 (H) 04/22/2021   CO2 25 04/22/2021   GLUCOSE 155 (H) 04/22/2021   BUN 8 04/22/2021   CREATININE 0.82 04/22/2021   CALCIUM 8.5 (L) 04/22/2021   PROT 5.9 (L) 04/22/2021   ALBUMIN 3.6 04/22/2021   AST 31 04/22/2021   ALT 37 04/22/2021   ALKPHOS 186 (H) 04/22/2021   BILITOT 0.3 04/22/2021   GFRNONAA >60 04/22/2021    Lab Results  Component Value Date   [id number] 933 (H) 04/08/2021     Medications: I have reviewed the patient's current medications.   Assessment/Plan: Pancreas cancer Outside CT-apparent pancreas abnormality (we do not have  a copy of the report) MRI abdomen 11/06/2020-pancreatic body and tail atrophy with duct dilatation up to 9 mm.  Both ducts undergo an abrupt transition in the region of the pancreatic head.  Non border deforming pancreatic head mass measuring 3.2 x 2.6 cm.  No arterial involvement by tumor.  SMV adjacent to presumed tumor without encasement.  Portal vein uninvolved.  No abdominal adenopathy.  Left periaortic 7 mm node not pathologic by size criteria.  No ascites.  Subtle left-sided pericolonic nodule 5 mm. EUS/ERCP 11/11/2020-EGD showed gastritis which was biopsied, mucosal changes in the duodenum, mucosal changes in the duodenum sweep (gastric antral and oxyntic mucosa with no specific histopathologic changes; Warthin Starry stain negative for H pylori).  EUS showed an irregular mass in the pancreatic head measuring 26 mm x 30 mm; the outer margins were irregular.  There was sonographic evidence suggesting invasion into the portal vein (manifested by abutment) and the superior mesenteric vein (manifested by abutment); an intact interface was seen between the mass and the superior mesenteric artery and celiac trunk suggesting a lack of invasion.  Endosonographic imaging in the visualized portion of the liver showed no mass.  There was dilatation of the common bile duct and the common hepatic duct.  No malignant appearing lymph nodes were visualized in the celiac region, peripancreatic region, porta hepatis region.  FNA biopsy of the pancreas head mass  showed malignant cells consistent with adenocarcinoma.  Biliary access could not be obtained.  Cholangiogram 11/12/2020-severe intrahepatic biliary dilatation.  A peripheral right hepatic bile duct was successfully cannulated.  Internal/external biliary drain placed.  Cytology on biliary drain bile showed malignant cells consistent with adenocarcinoma.   CT chest 11/13/2020-no evidence of metastatic disease.   CT pelvis 11/13/2020-nonspecific circumferential wall  thickening of the cecum measuring up to 12 mm in thickness.  Otherwise no evidence of intrapelvic metastases.   11/19/2020-biliary stent placement, biliary drain exchange 11/28/2020-exchange of internal/external biliary drain secondary to persistent jaundice CT abdomen/pelvis 11/28/2020-pancreas head mass, 3.5 x 2.8 cm, no encasement of the celiac trunk or SMA, partial encasement of the portal venous confluence-at least 180 degrees, partial encasement of the SMV-approximately 180 degrees, ventral extension of tumor abuts the posterior duodenum, no adenopathy, percutaneous biliary stent with decompression of previous dilated intrahepatic and common bile ducts Cycle 1 FOLFIRINOX 01/01/2021, irinotecan held Cycle 2 FOLFIRINOX 01/13/2021, irinotecan held Cycle 3 FOLFIRINOX 01/28/2021 Cycle 4 FOLFIRINOX 02/09/2021 Cycle 5 FOLFIRINOX 03/03/2021, Ziextenzo Cycle 6 FOLFIRINOX 03/25/2021, Ziextenzo CT abdomen/pelvis 04/06/2021-no change in pancreas head mass with partial encasement of the portal venous confluence, nonocclusive SMV thrombus, borderline upper abdominal lymph nodes-unchanged, no evidence of liver metastases Cycle 7 FOLFIRINOX 04/08/2021,Ziextenzo Cycle 8 FOLFIRINOX 04/22/2021, Ziextenzo   Painless jaundice-internal/external biliary drain placed 11/12/2020.  Biliary stent placed and drain exchange 11/19/2020, exchange of internal/external drain 11/28/2020 Weight loss Diabetes diagnosed June 2022 Change in bowel habits February 2022 Colonoscopy 08/06/2020-8 mm polyp in the sigmoid colon, a few small and large mouth diverticula in the sigmoid colon, normal mucosa found in the entire colon with biopsies for histology taken with a cold forceps from the right colon and left colon for evaluation of microscopic colitis (right colon-colonic mucosa with no significant pathologic findings; left colon-colonic mucosa with no significant pathologic findings; sigmoid polyp-tubular adenoma, negative for high-grade  dysplasia). Hypertension Admission 12/12/2020 with acute cholecystitis, cholecystostomy tube 12/17/2020     Disposition: Francisco Frank appears stable.  He continues to tolerate the chemotherapy well.  He will complete cycle 8 FOLFIRINOX today.  He is scheduled for surgical evaluation at [facility] on 05/01/2021.  He will return for an office visit here on 05/06/2021.  We will make a decision on continuing chemotherapy based on the evaluation at [facility].  The potassium is low today.  He will begin a potassium supplement.  I recommended he try Imodium for the frequent bowel movements.  He is taking Creon.  Betsy Coder, MD  04/22/2021  9:29 AM

## 2021-04-22 NOTE — Progress Notes (Signed)
Patient presents for treatment. RN assessment completed along with the following:  Labs/vitals reviewed - Yes, and within treatment parameters.   Weight within 10% of previous measurement - Yes Informed consent completed and reflects current therapy/intent - Yes, on date 01/01/2021             Provider progress note reviewed - Yes, today's provider note was reviewed. Treatment/Antibody/Supportive plan reviewed - Yes, and there are no adjustments needed for today's treatment. S&H and other orders reviewed - Yes, and there are no additional orders identified. Previous treatment date reviewed - Yes, and the appropriate amount of time has elapsed between treatments. Clinic Hand Off Received from - No  Patient to proceed with treatment.

## 2021-04-22 NOTE — Progress Notes (Signed)
Patient seen by Dr. Benay Spice today  Vitals are within treatment parameters.  Labs reviewed by Dr. Benay Spice CBC diff reviewed and within treatment parameters, CMP pending. Will be adding KCL 20 meq po daily.  Per physician team, patient is ready for treatment and there are NO modifications to the treatment plan.

## 2021-04-23 ENCOUNTER — Encounter: Payer: Self-pay | Admitting: *Deleted

## 2021-04-23 ENCOUNTER — Other Ambulatory Visit: Payer: Self-pay | Admitting: *Deleted

## 2021-04-23 LAB — CANCER ANTIGEN 19-9: CA 19-9: 527 U/mL — ABNORMAL HIGH (ref 0–35)

## 2021-04-23 MED ORDER — APIXABAN 5 MG PO TABS: 5.0000 mg | ORAL_TABLET | Freq: Two times a day (BID) | ORAL | 1 refills | Status: DC

## 2021-04-23 NOTE — Progress Notes (Signed)
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 PRescription sent to pharmacy and patient called and informed.  Patient to have appt at [facility] next week with Dr Hyman Hopes  Discussed bleeding prevention and encouraged patient to call immediately if he develops any bleeding.  Patient verbalized understanding    Time spent counseling/coordinating care: 30-45 minutes

## 2021-04-23 NOTE — Progress Notes (Signed)
Eliquis 5 mg BID escribed to pharmacy

## 2021-04-24 ENCOUNTER — Inpatient Hospital Stay: Payer: Medicare Other

## 2021-04-24 ENCOUNTER — Other Ambulatory Visit: Payer: Self-pay

## 2021-04-24 VITALS — BP 127/77 | HR 58 | Temp 98.2°F | Resp 18

## 2021-04-24 DIAGNOSIS — Z5111 Encounter for antineoplastic chemotherapy: Secondary | ICD-10-CM | POA: Diagnosis not present

## 2021-04-24 DIAGNOSIS — C250 Malignant neoplasm of head of pancreas: Secondary | ICD-10-CM

## 2021-04-24 MED ORDER — PEGFILGRASTIM-BMEZ 6 MG/0.6ML ~~LOC~~ SOSY
6.0000 mg | PREFILLED_SYRINGE | Freq: Once | SUBCUTANEOUS | Status: AC
  Administered 2021-04-24: 6 mg via SUBCUTANEOUS
  Filled 2021-04-24: qty 0.6

## 2021-04-24 MED ORDER — SODIUM CHLORIDE 0.9% FLUSH
10.0000 mL | INTRAVENOUS | Status: DC | PRN
  Administered 2021-04-24: 10 mL

## 2021-04-24 MED ORDER — HEPARIN SOD (PORK) LOCK FLUSH 100 UNIT/ML IV SOLN
500.0000 [IU] | Freq: Once | INTRAVENOUS | Status: AC | PRN
  Administered 2021-04-24: 500 [IU]

## 2021-04-24 NOTE — Patient Instructions (Signed)

## 2021-04-26 ENCOUNTER — Encounter: Payer: Self-pay | Admitting: Oncology

## 2021-04-28 ENCOUNTER — Other Ambulatory Visit: Payer: Self-pay | Admitting: *Deleted

## 2021-04-28 MED ORDER — POTASSIUM CHLORIDE CRYS ER 20 MEQ PO TBCR: 20.0000 meq | EXTENDED_RELEASE_TABLET | Freq: Every day | ORAL | 2 refills | Status: DC

## 2021-05-06 ENCOUNTER — Other Ambulatory Visit: Payer: Self-pay

## 2021-05-06 ENCOUNTER — Inpatient Hospital Stay: Payer: Medicare Other

## 2021-05-06 ENCOUNTER — Inpatient Hospital Stay: Payer: Medicare Other | Admitting: Oncology

## 2021-05-06 VITALS — BP 115/83 | HR 67 | Temp 97.8°F | Resp 18 | Ht 68.0 in | Wt 148.2 lb

## 2021-05-06 DIAGNOSIS — C250 Malignant neoplasm of head of pancreas: Secondary | ICD-10-CM | POA: Diagnosis not present

## 2021-05-06 DIAGNOSIS — Z5111 Encounter for antineoplastic chemotherapy: Secondary | ICD-10-CM | POA: Diagnosis not present

## 2021-05-06 LAB — CBC WITH DIFFERENTIAL (CANCER CENTER ONLY)
Abs Immature Granulocytes: 0.52 10*3/uL — ABNORMAL HIGH (ref 0.00–0.07)
Basophils Absolute: 0.2 10*3/uL — ABNORMAL HIGH (ref 0.0–0.1)
Basophils Relative: 1 %
Eosinophils Absolute: 0.5 10*3/uL (ref 0.0–0.5)
Eosinophils Relative: 3 %
HCT: 32.3 % — ABNORMAL LOW (ref 39.0–52.0)
Hemoglobin: 10.5 g/dL — ABNORMAL LOW (ref 13.0–17.0)
Immature Granulocytes: 3 %
Lymphocytes Relative: 16 %
Lymphs Abs: 2.7 10*3/uL (ref 0.7–4.0)
MCH: 30.2 pg (ref 26.0–34.0)
MCHC: 32.5 g/dL (ref 30.0–36.0)
MCV: 92.8 fL (ref 80.0–100.0)
Monocytes Absolute: 1.6 10*3/uL — ABNORMAL HIGH (ref 0.1–1.0)
Monocytes Relative: 9 %
Neutro Abs: 11.5 10*3/uL — ABNORMAL HIGH (ref 1.7–7.7)
Neutrophils Relative %: 68 %
Platelet Count: 176 10*3/uL (ref 150–400)
RBC: 3.48 MIL/uL — ABNORMAL LOW (ref 4.22–5.81)
RDW: 16.6 % — ABNORMAL HIGH (ref 11.5–15.5)
WBC Count: 16.9 10*3/uL — ABNORMAL HIGH (ref 4.0–10.5)
nRBC: 0 % (ref 0.0–0.2)

## 2021-05-06 LAB — CMP (CANCER CENTER ONLY)
ALT: 41 U/L (ref 0–44)
AST: 42 U/L — ABNORMAL HIGH (ref 15–41)
Albumin: 3.6 g/dL (ref 3.5–5.0)
Alkaline Phosphatase: 265 U/L — ABNORMAL HIGH (ref 38–126)
Anion gap: 8 (ref 5–15)
BUN: 8 mg/dL (ref 8–23)
CO2: 26 mmol/L (ref 22–32)
Calcium: 8.7 mg/dL — ABNORMAL LOW (ref 8.9–10.3)
Chloride: 106 mmol/L (ref 98–111)
Creatinine: 0.9 mg/dL (ref 0.61–1.24)
GFR, Estimated: >60 mL/min (ref >60)
Glucose, Bld: 239 mg/dL — ABNORMAL HIGH (ref 70–99)
Potassium: 4 mmol/L (ref 3.5–5.1)
Sodium: 140 mmol/L (ref 135–145)
Total Bilirubin: 0.3 mg/dL (ref 0.3–1.2)
Total Protein: 6.1 g/dL — ABNORMAL LOW (ref 6.5–8.1)

## 2021-05-06 LAB — MAGNESIUM: Magnesium: 1.8 mg/dL (ref 1.7–2.4)

## 2021-05-06 NOTE — Progress Notes (Signed)
DISCONTINUE ON PATHWAY REGIMEN - Pancreatic Adenocarcinoma     A cycle is every 14 days:     Oxaliplatin      Leucovorin      Irinotecan      Fluorouracil   **Always confirm dose/schedule in your pharmacy ordering system**  REASON: Other Reason PRIOR TREATMENT: PANOS94: mFOLFIRINOX q14 Days x 4 Cycles TREATMENT RESPONSE: Stable Disease (SD)  START OFF PATHWAY REGIMEN - Pancreatic Adenocarcinoma   [id number]:Gemcitabine 1,000 mg/m2 IV D1,8,15 + Nab-Paclitaxel 125 mg/m2 IV D1,8,15 q28 Days:   A cycle is every 28 days:     Nab-paclitaxel (protein bound)      Gemcitabine   **Always confirm dose/schedule in your pharmacy ordering system**  Patient Characteristics: Preoperative (Clinical Staging), Borderline Resectable, PS = 0,1, BRCA1/2 and PALB2 Mutation Absent/Unknown Therapeutic Status: Preoperative (Clinical Staging) AJCC T Category: Staged < 8th Ed. AJCC N Category: Staged < 8th Ed. Resectability Status: Borderline Resectable AJCC M Category: Staged < 8th Ed. AJCC 8 Stage Grouping: Staged < 8th Ed. ECOG Performance Status: 0 BRCA1/2 Mutation Status: Awaiting Test Results PALB2 Mutation Status: Awaiting Test Results Intent of Therapy: Curative Intent, Discussed with Patient

## 2021-05-06 NOTE — Progress Notes (Signed)
[place] OFFICE PROGRESS NOTE   Diagnosis: Pancreas cancer  INTERVAL HISTORY:   Francisco Frank returns as scheduled.  He completed another cycle of FOLFIRINOX on 04/22/2021.  He reports mild tingling in the feet.  No other complaint.  Good appetite.  No pain. He saw Dr. Hyman Hopes 05/01/2021.  He does not recommend surgery at present.  Dr. Hyman Hopes present his case at the [facility] multidisciplinary conference.  They recommend beginning treatment with gemcitabine/Abraxane. Francisco Frank continues apixaban anticoagulation.  No bleeding.  Objective:  Vital signs in last 24 hours:  Blood pressure 115/83, pulse 67, temperature 97.8 F (36.6 C), resp. rate 18, height 5\' 8"  (1.727 m), weight 148 lb 3.2 oz (67.2 kg), SpO2 100 %.    HEENT: No thrush or ulcers Resp: Lungs clear bilaterally Cardio: Regular rate and rhythm GI: No hepatosplenomegaly, nontender, no mass Vascular: No leg edema  Portacath/PICC-without erythema  Lab Results:  Lab Results  Component Value Date   WBC 16.9 (H) 05/06/2021   HGB 10.5 (L) 05/06/2021   HCT 32.3 (L) 05/06/2021   MCV 92.8 05/06/2021   PLT 176 05/06/2021   NEUTROABS 11.5 (H) 05/06/2021    CMP  Lab Results  Component Value Date   NA 143 04/22/2021   K 3.3 (L) 04/22/2021   CL 112 (H) 04/22/2021   CO2 25 04/22/2021   GLUCOSE 155 (H) 04/22/2021   BUN 8 04/22/2021   CREATININE 0.82 04/22/2021   CALCIUM 8.5 (L) 04/22/2021   PROT 5.9 (L) 04/22/2021   ALBUMIN 3.6 04/22/2021   AST 31 04/22/2021   ALT 37 04/22/2021   ALKPHOS 186 (H) 04/22/2021   BILITOT 0.3 04/22/2021   GFRNONAA >60 04/22/2021    Lab Results  Component Value Date   [id number] 527 (H) 04/22/2021      Medications: I have reviewed the patient's current medications.   Assessment/Plan: Pancreas cancer Outside CT-apparent pancreas abnormality (we do not have a copy of the report) MRI abdomen 11/06/2020-pancreatic body and tail atrophy with duct dilatation up to 9 mm.  Both  ducts undergo an abrupt transition in the region of the pancreatic head.  Non border deforming pancreatic head mass measuring 3.2 x 2.6 cm.  No arterial involvement by tumor.  SMV adjacent to presumed tumor without encasement.  Portal vein uninvolved.  No abdominal adenopathy.  Left periaortic 7 mm node not pathologic by size criteria.  No ascites.  Subtle left-sided pericolonic nodule 5 mm. EUS/ERCP 11/11/2020-EGD showed gastritis which was biopsied, mucosal changes in the duodenum, mucosal changes in the duodenum sweep (gastric antral and oxyntic mucosa with no specific histopathologic changes; Warthin Starry stain negative for H pylori).  EUS showed an irregular mass in the pancreatic head measuring 26 mm x 30 mm; the outer margins were irregular.  There was sonographic evidence suggesting invasion into the portal vein (manifested by abutment) and the superior mesenteric vein (manifested by abutment); an intact interface was seen between the mass and the superior mesenteric artery and celiac trunk suggesting a lack of invasion.  Endosonographic imaging in the visualized portion of the liver showed no mass.  There was dilatation of the common bile duct and the common hepatic duct.  No malignant appearing lymph nodes were visualized in the celiac region, peripancreatic region, porta hepatis region.  FNA biopsy of the pancreas head mass showed malignant cells consistent with adenocarcinoma.  Biliary access could not be obtained.  Cholangiogram 11/12/2020-severe intrahepatic biliary dilatation.  A peripheral right hepatic bile duct was  successfully cannulated.  Internal/external biliary drain placed.  Cytology on biliary drain bile showed malignant cells consistent with adenocarcinoma.   CT chest 11/13/2020-no evidence of metastatic disease.   CT pelvis 11/13/2020-nonspecific circumferential wall thickening of the cecum measuring up to 12 mm in thickness.  Otherwise no evidence of intrapelvic metastases.    11/19/2020-biliary stent placement, biliary drain exchange 11/28/2020-exchange of internal/external biliary drain secondary to persistent jaundice CT abdomen/pelvis 11/28/2020-pancreas head mass, 3.5 x 2.8 cm, no encasement of the celiac trunk or SMA, partial encasement of the portal venous confluence-at least 180 degrees, partial encasement of the SMV-approximately 180 degrees, ventral extension of tumor abuts the posterior duodenum, no adenopathy, percutaneous biliary stent with decompression of previous dilated intrahepatic and common bile ducts Cycle 1 FOLFIRINOX 01/01/2021, irinotecan held Cycle 2 FOLFIRINOX 01/13/2021, irinotecan held Cycle 3 FOLFIRINOX 01/28/2021 Cycle 4 FOLFIRINOX 02/09/2021 Cycle 5 FOLFIRINOX 03/03/2021, Ziextenzo Cycle 6 FOLFIRINOX 03/25/2021, Ziextenzo CT abdomen/pelvis 04/06/2021-no change in pancreas head mass with partial encasement of the portal venous confluence, nonocclusive SMV thrombus, borderline upper abdominal lymph nodes-unchanged, no evidence of liver metastases Cycle 7 FOLFIRINOX 04/08/2021,Ziextenzo Cycle 8 FOLFIRINOX 04/22/2021, Ziextenzo 05/01/2021-multidisciplinary consultation at [facility], changed to gemcitabine/Abraxane recommended Cycle 1 gemcitabine/Abraxane 05/06/2021   Painless jaundice-internal/external biliary drain placed 11/12/2020.  Biliary stent placed and drain exchange 11/19/2020, exchange of internal/external drain 11/28/2020 Weight loss Diabetes diagnosed June 2022 Change in bowel habits February 2022 Colonoscopy 08/06/2020-8 mm polyp in the sigmoid colon, a few small and large mouth diverticula in the sigmoid colon, normal mucosa found in the entire colon with biopsies for histology taken with a cold forceps from the right colon and left colon for evaluation of microscopic colitis (right colon-colonic mucosa with no significant pathologic findings; left colon-colonic mucosa with no significant pathologic findings; sigmoid polyp-tubular adenoma, negative  for high-grade dysplasia). 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  Has completed 8 cycles of FOLFIRINOX.  There is a persistent pancreas mass following 8 cycles FOLFIRINOX.  He was seen by Dr. Hyman Hopes 05/01/2021.  He feels the tumor is not resectable at present.  He recommends continue anticoagulation for the SMV thrombus and switching to a different systemic therapy regimen.  I recommend gemcitabine/Abraxane.  I reviewed potential toxicities associated with gemcitabine/Abraxane regimen including the chance of hematologic toxicity, rash, fever, pneumonitis, and progressive neuropathy.  He agrees to proceed.  He will complete cycle 1 gemcitabine/Abraxane today.  Francisco Frank, Francisco Frank will return for an office visit and cycle 2 gemcitabine/Abraxane in 2 weeks.  He will undergo restaging CTs at [facility] in 2 months.  A chemotherapy plan was entered today.  Betsy Coder, MD  05/06/2021  9:15 AM

## 2021-05-07 LAB — CANCER ANTIGEN 19-9: CA 19-9: 490 U/mL — ABNORMAL HIGH (ref 0–35)

## 2021-05-08 ENCOUNTER — Inpatient Hospital Stay: Payer: Medicare Other

## 2021-05-11 ENCOUNTER — Other Ambulatory Visit: Payer: Self-pay

## 2021-05-11 ENCOUNTER — Inpatient Hospital Stay: Payer: Medicare Other

## 2021-05-11 VITALS — BP 119/80 | HR 61 | Temp 98.1°F | Resp 17 | Ht 68.0 in | Wt 148.6 lb

## 2021-05-11 DIAGNOSIS — Z5111 Encounter for antineoplastic chemotherapy: Secondary | ICD-10-CM | POA: Diagnosis not present

## 2021-05-11 DIAGNOSIS — C250 Malignant neoplasm of head of pancreas: Secondary | ICD-10-CM

## 2021-05-11 MED ORDER — SODIUM CHLORIDE 0.9% FLUSH
10.0000 mL | INTRAVENOUS | Status: DC | PRN
  Administered 2021-05-11: 10 mL

## 2021-05-11 MED ORDER — PACLITAXEL PROTEIN-BOUND CHEMO INJECTION 100 MG
100.0000 mg/m2 | Freq: Once | INTRAVENOUS | Status: AC
  Administered 2021-05-11: 175 mg via INTRAVENOUS
  Filled 2021-05-11: qty 35

## 2021-05-11 MED ORDER — SODIUM CHLORIDE 0.9 % IV SOLN
1000.0000 mg/m2 | Freq: Once | INTRAVENOUS | Status: AC
  Administered 2021-05-11: 1786 mg via INTRAVENOUS
  Filled 2021-05-11: qty 21.04

## 2021-05-11 MED ORDER — PROCHLORPERAZINE MALEATE 10 MG PO TABS
10.0000 mg | ORAL_TABLET | Freq: Once | ORAL | Status: AC
  Administered 2021-05-11: 10 mg via ORAL
  Filled 2021-05-11: qty 1

## 2021-05-11 MED ORDER — HEPARIN SOD (PORK) LOCK FLUSH 100 UNIT/ML IV SOLN
500.0000 [IU] | Freq: Once | INTRAVENOUS | Status: AC | PRN
  Administered 2021-05-11: 500 [IU]

## 2021-05-11 MED ORDER — SODIUM CHLORIDE 0.9 % IV SOLN: Freq: Once | INTRAVENOUS | Status: AC

## 2021-05-11 NOTE — Progress Notes (Signed)
Pharmacist Chemotherapy Monitoring - Initial Assessment    Anticipated start date: 05/11/21   The following has been reviewed per standard work regarding the patient's treatment regimen: The patient's diagnosis, treatment plan and drug doses, and organ/hematologic function Lab orders and baseline tests specific to treatment regimen  The treatment plan start date, drug sequencing, and pre-medications Prior authorization status  Patient's documented medication list, including drug-drug interaction screen and prescriptions for anti-emetics and supportive care specific to the treatment regimen The drug concentrations, fluid compatibility, administration routes, and timing of the medications to be used The patient's access for treatment and lifetime cumulative dose history, if applicable  The patient's medication allergies and previous infusion related reactions, if applicable   Changes made to treatment plan:  N/A  Follow up needed:  N/A   Francisco Frank, [facility], 05/11/2021  3:06 PM

## 2021-05-11 NOTE — Progress Notes (Signed)
Patient may keep his second appt for gemzar/abraxane on 3/3 even though he will be 3 days early for treatment per Dr. Benay Spice.

## 2021-05-11 NOTE — Progress Notes (Signed)
Patient presents for treatment. RN assessment completed along with the following:  Labs/vitals reviewed - Yes, and within treatment parameters.  05/06/21 Weight within 10% of previous measurement - Yes Informed consent completed and reflects current therapy/intent - Yes, on date 05/11/21             Provider progress note reviewed - Patient not seen by provider today. Most recent note dated 05/06/21 reviewed. Treatment/Antibody/Supportive plan reviewed - Yes, and there are no adjustments needed for today's treatment. S&H and other orders reviewed - Yes, and there are no additional orders identified. Previous treatment date reviewed - Yes, and the appropriate amount of time has elapsed between treatments. Clinic Hand Off Received from - none   Patient to proceed with treatment.

## 2021-05-11 NOTE — Patient Instructions (Signed)
[facility]   Discharge Instructions: Thank you for choosing [facility] to provide your oncology and hematology care.   If you have a lab appointment with the [facility], please go directly to the [facility] and check in at the registration area.   Wear comfortable clothing and clothing appropriate for easy access to any Portacath or PICC line.   We strive to give you quality time with your provider. You may need to reschedule your appointment if you arrive late (15 or more minutes).  Arriving late affects you and other patients whose appointments are after yours.  Also, if you miss three or more appointments without notifying the office, you may be dismissed from the clinic at the providers discretion.      For prescription refill requests, have your pharmacy contact our office and allow 72 hours for refills to be completed.    Today you received the following chemotherapy and/or immunotherapy agents Abraxane, Gemzar      To help prevent nausea and vomiting after your treatment, we encourage you to take your nausea medication as directed.  BELOW ARE SYMPTOMS THAT SHOULD BE REPORTED IMMEDIATELY: *FEVER GREATER THAN 100.4 F (38 C) OR HIGHER *CHILLS OR SWEATING *NAUSEA AND VOMITING THAT IS NOT CONTROLLED WITH YOUR NAUSEA MEDICATION *UNUSUAL SHORTNESS OF BREATH *UNUSUAL BRUISING OR BLEEDING *URINARY PROBLEMS (pain or burning when urinating, or frequent urination) *BOWEL PROBLEMS (unusual diarrhea, constipation, pain near the anus) TENDERNESS IN MOUTH AND THROAT WITH OR WITHOUT PRESENCE OF ULCERS (sore throat, sores in mouth, or a toothache) UNUSUAL RASH, SWELLING OR PAIN  UNUSUAL VAGINAL DISCHARGE OR ITCHING   Items with * indicate a potential emergency and should be followed up as soon as possible or go to the Emergency Department if any problems should occur.  Please show the CHEMOTHERAPY ALERT CARD or IMMUNOTHERAPY ALERT CARD at  check-in to the Emergency Department and triage nurse.  Should you have questions after your visit or need to cancel or reschedule your appointment, please contact [facility]  Dept: [phone]  and follow the prompts.  Office hours are 8:00 a.m. to 4:30 p.m. Monday - Friday. Please note that voicemails left after 4:00 p.m. may not be returned until the following business day.  We are closed weekends and major holidays. You have access to a nurse at all times for urgent questions. Please call the main number to the clinic Dept: [phone] and follow the prompts.   For any non-urgent questions, you may also contact your provider using MyChart. We now offer e-Visits for anyone 26 and older to request care online for non-urgent symptoms. For details visit [email or web address].   Also download the MyChart app! Go to the app store, search "MyChart", open the app, select [facility], and log in with your MyChart username and password.  Due to Covid, a mask is required upon entering the hospital/clinic. If you do not have a mask, one will be given to you upon arrival. For doctor visits, patients may have 1 support person aged 11 or older with them. For treatment visits, patients cannot have anyone with them due to current Covid guidelines and our immunocompromised population.   Nanoparticle Albumin-Bound Paclitaxel injection What is this medication? NANOPARTICLE ALBUMIN-BOUND PACLITAXEL (Na no PAHR ti kuhl  al BYOO muhn-bound  PAK li TAX el) is a chemotherapy drug. It targets fast dividing cells, like cancer cells, and causes these cells to die. This medicine is used  to treat advanced breast cancer, lung cancer, and pancreatic cancer. This medicine may be used for other purposes; ask your health care provider or pharmacist if you have questions. COMMON BRAND NAME(S): Abraxane What should I tell my care team before I take this medication? They need to know if you have any  of these conditions: kidney disease liver disease low blood counts, like low white cell, platelet, or red cell counts lung or breathing disease, like asthma tingling of the fingers or toes, or other nerve disorder an unusual or allergic reaction to paclitaxel, albumin, other chemotherapy, other medicines, foods, dyes, or preservatives pregnant or trying to get pregnant breast-feeding How should I use this medication? This drug is given as an infusion into a vein. It is administered in a hospital or clinic by a specially trained health care professional. Talk to your pediatrician regarding the use of this medicine in children. Special care may be needed. Overdosage: If you think you have taken too much of this medicine contact a poison control center or emergency room at once. NOTE: This medicine is only for you. Do not share this medicine with others. What if I miss a dose? It is important not to miss your dose. Call your doctor or health care professional if you are unable to keep an appointment. What may interact with this medication? This medicine may interact with the following medications: antiviral medicines for hepatitis, HIV or AIDS certain antibiotics like erythromycin and clarithromycin certain medicines for fungal infections like ketoconazole and itraconazole certain medicines for seizures like carbamazepine, phenobarbital, phenytoin gemfibrozil nefazodone rifampin St. John's wort This list may not describe all possible interactions. Give your health care provider a list of all the medicines, herbs, non-prescription drugs, or dietary supplements you use. Also tell them if you smoke, drink alcohol, or use illegal drugs. Some items may interact with your medicine. What should I watch for while using this medication? Your condition will be monitored carefully while you are receiving this medicine. You will need important blood work done while you are taking this medicine. This  medicine can cause serious allergic reactions. If you experience allergic reactions like skin rash, itching or hives, swelling of the face, lips, or tongue, tell your doctor or health care professional right away. In some cases, you may be given additional medicines to help with side effects. Follow all directions for their use. This drug may make you feel generally unwell. This is not uncommon, as chemotherapy can affect healthy cells as well as cancer cells. Report any side effects. Continue your course of treatment even though you feel ill unless your doctor tells you to stop. Call your doctor or health care professional for advice if you get a fever, chills or sore throat, or other symptoms of a cold or flu. Do not treat yourself. This drug decreases your body's ability to fight infections. Try to avoid being around people who are sick. This medicine may increase your risk to bruise or bleed. Call your doctor or health care professional if you notice any unusual bleeding. Be careful brushing and flossing your teeth or using a toothpick because you may get an infection or bleed more easily. If you have any dental work done, tell your dentist you are receiving this medicine. Avoid taking products that contain aspirin, acetaminophen, ibuprofen, naproxen, or ketoprofen unless instructed by your doctor. These medicines may hide a fever. Do not become pregnant while taking this medicine or for 6 months after stopping it. Women should  inform their doctor if they wish to become pregnant or think they might be pregnant. Men should not father a child while taking this medicine or for 3 months after stopping it. There is a potential for serious side effects to an unborn child. Talk to your health care professional or pharmacist for more information. Do not breast-feed an infant while taking this medicine or for 2 weeks after stopping it. This medicine may interfere with the ability to get pregnant or to father a  child. You should talk to your doctor or health care professional if you are concerned about your fertility. What side effects may I notice from receiving this medication? Side effects that you should report to your doctor or health care professional as soon as possible: allergic reactions like skin rash, itching or hives, swelling of the face, lips, or tongue breathing problems changes in vision fast, irregular heartbeat low blood pressure mouth sores pain, tingling, numbness in the hands or feet signs of decreased platelets or bleeding - bruising, pinpoint red spots on the skin, black, tarry stools, blood in the urine signs of decreased red blood cells - unusually weak or tired, feeling faint or lightheaded, falls signs of infection - fever or chills, cough, sore throat, pain or difficulty passing urine signs and symptoms of liver injury like dark yellow or brown urine; general ill feeling or flu-like symptoms; light-colored stools; loss of appetite; nausea; right upper belly pain; unusually weak or tired; yellowing of the eyes or skin swelling of the ankles, feet, hands unusually slow heartbeat Side effects that usually do not require medical attention (report to your doctor or health care professional if they continue or are bothersome): diarrhea hair loss loss of appetite nausea, vomiting tiredness This list may not describe all possible side effects. Call your doctor for medical advice about side effects. You may report side effects to FDA at 1-800-FDA-1088. Where should I keep my medication? This drug is given in a hospital or clinic and will not be stored at home. NOTE: This sheet is a summary. It may not cover all possible information. If you have questions about this medicine, talk to your doctor, pharmacist, or health care provider.  2022 Elsevier/Gold Standard (2016-11-09 00:00:00)  Gemcitabine injection What is this medication? GEMCITABINE (jem SYE ta been) is a  chemotherapy drug. This medicine is used to treat many types of cancer like breast cancer, lung cancer, pancreatic cancer, and ovarian cancer. This medicine may be used for other purposes; ask your health care provider or pharmacist if you have questions. COMMON BRAND NAME(S): Gemzar, Infugem What should I tell my care team before I take this medication? They need to know if you have any of these conditions: blood disorders infection kidney disease liver disease lung or breathing disease, like asthma recent or ongoing radiation therapy an unusual or allergic reaction to gemcitabine, other chemotherapy, other medicines, foods, dyes, or preservatives pregnant or trying to get pregnant breast-feeding How should I use this medication? This drug is given as an infusion into a vein. It is administered in a hospital or clinic by a specially trained health care professional. Talk to your pediatrician regarding the use of this medicine in children. Special care may be needed. Overdosage: If you think you have taken too much of this medicine contact a poison control center or emergency room at once. NOTE: This medicine is only for you. Do not share this medicine with others. What if I miss a dose? It is important not  to miss your dose. Call your doctor or health care professional if you are unable to keep an appointment. What may interact with this medication? medicines to increase blood counts like filgrastim, pegfilgrastim, sargramostim some other chemotherapy drugs like cisplatin vaccines Talk to your doctor or health care professional before taking any of these medicines: acetaminophen aspirin ibuprofen ketoprofen naproxen This list may not describe all possible interactions. Give your health care provider a list of all the medicines, herbs, non-prescription drugs, or dietary supplements you use. Also tell them if you smoke, drink alcohol, or use illegal drugs. Some items may interact with  your medicine. What should I watch for while using this medication? Visit your doctor for checks on your progress. This drug may make you feel generally unwell. This is not uncommon, as chemotherapy can affect healthy cells as well as cancer cells. Report any side effects. Continue your course of treatment even though you feel ill unless your doctor tells you to stop. In some cases, you may be given additional medicines to help with side effects. Follow all directions for their use. Call your doctor or health care professional for advice if you get a fever, chills or sore throat, or other symptoms of a cold or flu. Do not treat yourself. This drug decreases your body's ability to fight infections. Try to avoid being around people who are sick. This medicine may increase your risk to bruise or bleed. Call your doctor or health care professional if you notice any unusual bleeding. Be careful brushing and flossing your teeth or using a toothpick because you may get an infection or bleed more easily. If you have any dental work done, tell your dentist you are receiving this medicine. Avoid taking products that contain aspirin, acetaminophen, ibuprofen, naproxen, or ketoprofen unless instructed by your doctor. These medicines may hide a fever. Do not become pregnant while taking this medicine or for 6 months after stopping it. Women should inform their doctor if they wish to become pregnant or think they might be pregnant. Men should not father a child while taking this medicine and for 3 months after stopping it. There is a potential for serious side effects to an unborn child. Talk to your health care professional or pharmacist for more information. Do not breast-feed an infant while taking this medicine or for at least 1 week after stopping it. Men should inform their doctors if they wish to father a child. This medicine may lower sperm counts. Talk with your doctor or health care professional if you are  concerned about your fertility. What side effects may I notice from receiving this medication? Side effects that you should report to your doctor or health care professional as soon as possible: allergic reactions like skin rash, itching or hives, swelling of the face, lips, or tongue breathing problems pain, redness, or irritation at site where injected signs and symptoms of a dangerous change in heartbeat or heart rhythm like chest pain; dizziness; fast or irregular heartbeat; palpitations; feeling faint or lightheaded, falls; breathing problems signs of decreased platelets or bleeding - bruising, pinpoint red spots on the skin, black, tarry stools, blood in the urine signs of decreased red blood cells - unusually weak or tired, feeling faint or lightheaded, falls signs of infection - fever or chills, cough, sore throat, pain or difficulty passing urine signs and symptoms of kidney injury like trouble passing urine or change in the amount of urine signs and symptoms of liver injury like dark yellow or  brown urine; general ill feeling or flu-like symptoms; light-colored stools; loss of appetite; nausea; right upper belly pain; unusually weak or tired; yellowing of the eyes or skin swelling of ankles, feet, hands Side effects that usually do not require medical attention (report to your doctor or health care professional if they continue or are bothersome): constipation diarrhea hair loss loss of appetite nausea rash vomiting This list may not describe all possible side effects. Call your doctor for medical advice about side effects. You may report side effects to FDA at 1-800-FDA-1088. Where should I keep my medication? This drug is given in a hospital or clinic and will not be stored at home. NOTE: This sheet is a summary. It may not cover all possible information. If you have questions about this medicine, talk to your doctor, pharmacist, or health care provider.  2022 Elsevier/Gold  Standard (2017-06-01 00:00:00)

## 2021-05-12 ENCOUNTER — Telehealth: Payer: Self-pay | Admitting: Emergency Medicine

## 2021-05-12 NOTE — Telephone Encounter (Signed)
24 Hour Callback  24 Hour callback post 1st time Abraxane and Gemzar infusion.  Pt reports feeling tired but is doing well otherwise. No N/V/D no other side effects.  Pt has no questions but knows that he can call with problems or questions

## 2021-05-22 ENCOUNTER — Inpatient Hospital Stay: Payer: Medicare Other | Admitting: Oncology

## 2021-05-22 ENCOUNTER — Encounter: Payer: Self-pay | Admitting: *Deleted

## 2021-05-22 ENCOUNTER — Inpatient Hospital Stay: Payer: Medicare Other | Attending: Nurse Practitioner

## 2021-05-22 ENCOUNTER — Inpatient Hospital Stay: Payer: Medicare Other

## 2021-05-22 ENCOUNTER — Other Ambulatory Visit: Payer: Self-pay

## 2021-05-22 VITALS — BP 116/80 | HR 66 | Temp 97.8°F | Resp 18 | Ht 68.0 in | Wt 152.6 lb

## 2021-05-22 DIAGNOSIS — C250 Malignant neoplasm of head of pancreas: Secondary | ICD-10-CM

## 2021-05-22 DIAGNOSIS — Z5111 Encounter for antineoplastic chemotherapy: Secondary | ICD-10-CM | POA: Diagnosis not present

## 2021-05-22 LAB — CBC WITH DIFFERENTIAL (CANCER CENTER ONLY)
Abs Immature Granulocytes: 0.01 10*3/uL (ref 0.00–0.07)
Basophils Absolute: 0.1 10*3/uL (ref 0.0–0.1)
Basophils Relative: 2 %
Eosinophils Absolute: 0.2 10*3/uL (ref 0.0–0.5)
Eosinophils Relative: 4 %
HCT: 28.5 % — ABNORMAL LOW (ref 39.0–52.0)
Hemoglobin: 9.2 g/dL — ABNORMAL LOW (ref 13.0–17.0)
Immature Granulocytes: 0 %
Lymphocytes Relative: 38 %
Lymphs Abs: 1.8 10*3/uL (ref 0.7–4.0)
MCH: 30.1 pg (ref 26.0–34.0)
MCHC: 32.3 g/dL (ref 30.0–36.0)
MCV: 93.1 fL (ref 80.0–100.0)
Monocytes Absolute: 0.8 10*3/uL (ref 0.1–1.0)
Monocytes Relative: 17 %
Neutro Abs: 1.8 10*3/uL (ref 1.7–7.7)
Neutrophils Relative %: 39 %
Platelet Count: 118 10*3/uL — ABNORMAL LOW (ref 150–400)
RBC: 3.06 MIL/uL — ABNORMAL LOW (ref 4.22–5.81)
RDW: 15.8 % — ABNORMAL HIGH (ref 11.5–15.5)
WBC Count: 4.6 10*3/uL (ref 4.0–10.5)
nRBC: 0 % (ref 0.0–0.2)

## 2021-05-22 LAB — CMP (CANCER CENTER ONLY)
ALT: 26 U/L (ref 0–44)
AST: 27 U/L (ref 15–41)
Albumin: 3.5 g/dL (ref 3.5–5.0)
Alkaline Phosphatase: 131 U/L — ABNORMAL HIGH (ref 38–126)
Anion gap: 6 (ref 5–15)
BUN: 10 mg/dL (ref 8–23)
CO2: 26 mmol/L (ref 22–32)
Calcium: 8.5 mg/dL — ABNORMAL LOW (ref 8.9–10.3)
Chloride: 109 mmol/L (ref 98–111)
Creatinine: 0.87 mg/dL (ref 0.61–1.24)
GFR, Estimated: >60 mL/min (ref >60)
Glucose, Bld: 181 mg/dL — ABNORMAL HIGH (ref 70–99)
Potassium: 4.1 mmol/L (ref 3.5–5.1)
Sodium: 141 mmol/L (ref 135–145)
Total Bilirubin: 0.3 mg/dL (ref 0.3–1.2)
Total Protein: 6 g/dL — ABNORMAL LOW (ref 6.5–8.1)

## 2021-05-22 MED ORDER — SODIUM CHLORIDE 0.9 % IV SOLN
800.0000 mg/m2 | Freq: Once | INTRAVENOUS | Status: AC
  Administered 2021-05-22: 1444 mg via INTRAVENOUS
  Filled 2021-05-22: qty 26.3

## 2021-05-22 MED ORDER — HEPARIN SOD (PORK) LOCK FLUSH 100 UNIT/ML IV SOLN
500.0000 [IU] | Freq: Once | INTRAVENOUS | Status: AC | PRN
  Administered 2021-05-22: 500 [IU]

## 2021-05-22 MED ORDER — PROCHLORPERAZINE MALEATE 10 MG PO TABS
10.0000 mg | ORAL_TABLET | Freq: Once | ORAL | Status: AC
  Administered 2021-05-22: 10 mg via ORAL
  Filled 2021-05-22: qty 1

## 2021-05-22 MED ORDER — PACLITAXEL PROTEIN-BOUND CHEMO INJECTION 100 MG
100.0000 mg/m2 | Freq: Once | INTRAVENOUS | Status: AC
  Administered 2021-05-22: 175 mg via INTRAVENOUS
  Filled 2021-05-22: qty 35

## 2021-05-22 MED ORDER — SODIUM CHLORIDE 0.9% FLUSH
10.0000 mL | INTRAVENOUS | Status: DC | PRN
  Administered 2021-05-22: 10 mL

## 2021-05-22 MED ORDER — SODIUM CHLORIDE 0.9 % IV SOLN: Freq: Once | INTRAVENOUS | Status: AC

## 2021-05-22 NOTE — Progress Notes (Signed)
Patient presents for treatment. RN assessment completed along with the following: ? ?Labs/vitals reviewed - Yes, and within treatment parameters.   ?Weight within 10% of previous measurement - Yes ?Informed consent completed and reflects current therapy/intent - Yes, on date 05/11/21             ?Provider progress note reviewed - Yes, today's provider note was reviewed. ?Treatment/Antibody/Supportive plan reviewed - Yes, and there are no adjustments needed for today's treatment. ?S&H and other orders reviewed - Yes, and there are no additional orders identified. ?Previous treatment date reviewed - Yes, and the appropriate amount of time has elapsed between treatments. ?Clinic Hand Off Received from - Merceda Elks, RN ? ?Patient to proceed with treatment.   ?

## 2021-05-22 NOTE — Patient Instructions (Signed)
[facility]   Discharge Instructions: Thank you for choosing [facility] to provide your oncology and hematology care.   If you have a lab appointment with the [facility], please go directly to the [facility] and check in at the registration area.   Wear comfortable clothing and clothing appropriate for easy access to any Portacath or PICC line.   We strive to give you quality time with your provider. You may need to reschedule your appointment if you arrive late (15 or more minutes).  Arriving late affects you and other patients whose appointments are after yours.  Also, if you miss three or more appointments without notifying the office, you may be dismissed from the clinic at the providers discretion.      For prescription refill requests, have your pharmacy contact our office and allow 72 hours for refills to be completed.    Today you received the following chemotherapy and/or immunotherapy agents Abraxane, Gemzar      To help prevent nausea and vomiting after your treatment, we encourage you to take your nausea medication as directed.  BELOW ARE SYMPTOMS THAT SHOULD BE REPORTED IMMEDIATELY: *FEVER GREATER THAN 100.4 F (38 C) OR HIGHER *CHILLS OR SWEATING *NAUSEA AND VOMITING THAT IS NOT CONTROLLED WITH YOUR NAUSEA MEDICATION *UNUSUAL SHORTNESS OF BREATH *UNUSUAL BRUISING OR BLEEDING *URINARY PROBLEMS (pain or burning when urinating, or frequent urination) *BOWEL PROBLEMS (unusual diarrhea, constipation, pain near the anus) TENDERNESS IN MOUTH AND THROAT WITH OR WITHOUT PRESENCE OF ULCERS (sore throat, sores in mouth, or a toothache) UNUSUAL RASH, SWELLING OR PAIN  UNUSUAL VAGINAL DISCHARGE OR ITCHING   Items with * indicate a potential emergency and should be followed up as soon as possible or go to the Emergency Department if any problems should occur.  Please show the CHEMOTHERAPY ALERT CARD or IMMUNOTHERAPY ALERT CARD at  check-in to the Emergency Department and triage nurse.  Should you have questions after your visit or need to cancel or reschedule your appointment, please contact [facility]  Dept: [phone]  and follow the prompts.  Office hours are 8:00 a.m. to 4:30 p.m. Monday - Friday. Please note that voicemails left after 4:00 p.m. may not be returned until the following business day.  We are closed weekends and major holidays. You have access to a nurse at all times for urgent questions. Please call the main number to the clinic Dept: [phone] and follow the prompts.   For any non-urgent questions, you may also contact your provider using MyChart. We now offer e-Visits for anyone 26 and older to request care online for non-urgent symptoms. For details visit [email or web address].   Also download the MyChart app! Go to the app store, search "MyChart", open the app, select [facility], and log in with your MyChart username and password.  Due to Covid, a mask is required upon entering the hospital/clinic. If you do not have a mask, one will be given to you upon arrival. For doctor visits, patients may have 1 support person aged 11 or older with them. For treatment visits, patients cannot have anyone with them due to current Covid guidelines and our immunocompromised population.   Nanoparticle Albumin-Bound Paclitaxel injection What is this medication? NANOPARTICLE ALBUMIN-BOUND PACLITAXEL (Na no PAHR ti kuhl  al BYOO muhn-bound  PAK li TAX el) is a chemotherapy drug. It targets fast dividing cells, like cancer cells, and causes these cells to die. This medicine is used  to treat advanced breast cancer, lung cancer, and pancreatic cancer. This medicine may be used for other purposes; ask your health care provider or pharmacist if you have questions. COMMON BRAND NAME(S): Abraxane What should I tell my care team before I take this medication? They need to know if you have any  of these conditions: kidney disease liver disease low blood counts, like low white cell, platelet, or red cell counts lung or breathing disease, like asthma tingling of the fingers or toes, or other nerve disorder an unusual or allergic reaction to paclitaxel, albumin, other chemotherapy, other medicines, foods, dyes, or preservatives pregnant or trying to get pregnant breast-feeding How should I use this medication? This drug is given as an infusion into a vein. It is administered in a hospital or clinic by a specially trained health care professional. Talk to your pediatrician regarding the use of this medicine in children. Special care may be needed. Overdosage: If you think you have taken too much of this medicine contact a poison control center or emergency room at once. NOTE: This medicine is only for you. Do not share this medicine with others. What if I miss a dose? It is important not to miss your dose. Call your doctor or health care professional if you are unable to keep an appointment. What may interact with this medication? This medicine may interact with the following medications: antiviral medicines for hepatitis, HIV or AIDS certain antibiotics like erythromycin and clarithromycin certain medicines for fungal infections like ketoconazole and itraconazole certain medicines for seizures like carbamazepine, phenobarbital, phenytoin gemfibrozil nefazodone rifampin St. John's wort This list may not describe all possible interactions. Give your health care provider a list of all the medicines, herbs, non-prescription drugs, or dietary supplements you use. Also tell them if you smoke, drink alcohol, or use illegal drugs. Some items may interact with your medicine. What should I watch for while using this medication? Your condition will be monitored carefully while you are receiving this medicine. You will need important blood work done while you are taking this medicine. This  medicine can cause serious allergic reactions. If you experience allergic reactions like skin rash, itching or hives, swelling of the face, lips, or tongue, tell your doctor or health care professional right away. In some cases, you may be given additional medicines to help with side effects. Follow all directions for their use. This drug may make you feel generally unwell. This is not uncommon, as chemotherapy can affect healthy cells as well as cancer cells. Report any side effects. Continue your course of treatment even though you feel ill unless your doctor tells you to stop. Call your doctor or health care professional for advice if you get a fever, chills or sore throat, or other symptoms of a cold or flu. Do not treat yourself. This drug decreases your body's ability to fight infections. Try to avoid being around people who are sick. This medicine may increase your risk to bruise or bleed. Call your doctor or health care professional if you notice any unusual bleeding. Be careful brushing and flossing your teeth or using a toothpick because you may get an infection or bleed more easily. If you have any dental work done, tell your dentist you are receiving this medicine. Avoid taking products that contain aspirin, acetaminophen, ibuprofen, naproxen, or ketoprofen unless instructed by your doctor. These medicines may hide a fever. Do not become pregnant while taking this medicine or for 6 months after stopping it. Women should  inform their doctor if they wish to become pregnant or think they might be pregnant. Men should not father a child while taking this medicine or for 3 months after stopping it. There is a potential for serious side effects to an unborn child. Talk to your health care professional or pharmacist for more information. Do not breast-feed an infant while taking this medicine or for 2 weeks after stopping it. This medicine may interfere with the ability to get pregnant or to father a  child. You should talk to your doctor or health care professional if you are concerned about your fertility. What side effects may I notice from receiving this medication? Side effects that you should report to your doctor or health care professional as soon as possible: allergic reactions like skin rash, itching or hives, swelling of the face, lips, or tongue breathing problems changes in vision fast, irregular heartbeat low blood pressure mouth sores pain, tingling, numbness in the hands or feet signs of decreased platelets or bleeding - bruising, pinpoint red spots on the skin, black, tarry stools, blood in the urine signs of decreased red blood cells - unusually weak or tired, feeling faint or lightheaded, falls signs of infection - fever or chills, cough, sore throat, pain or difficulty passing urine signs and symptoms of liver injury like dark yellow or brown urine; general ill feeling or flu-like symptoms; light-colored stools; loss of appetite; nausea; right upper belly pain; unusually weak or tired; yellowing of the eyes or skin swelling of the ankles, feet, hands unusually slow heartbeat Side effects that usually do not require medical attention (report to your doctor or health care professional if they continue or are bothersome): diarrhea hair loss loss of appetite nausea, vomiting tiredness This list may not describe all possible side effects. Call your doctor for medical advice about side effects. You may report side effects to FDA at 1-800-FDA-1088. Where should I keep my medication? This drug is given in a hospital or clinic and will not be stored at home. NOTE: This sheet is a summary. It may not cover all possible information. If you have questions about this medicine, talk to your doctor, pharmacist, or health care provider.  2022 Elsevier/Gold Standard (2016-11-09 00:00:00)  Gemcitabine injection What is this medication? GEMCITABINE (jem SYE ta been) is a  chemotherapy drug. This medicine is used to treat many types of cancer like breast cancer, lung cancer, pancreatic cancer, and ovarian cancer. This medicine may be used for other purposes; ask your health care provider or pharmacist if you have questions. COMMON BRAND NAME(S): Gemzar, Infugem What should I tell my care team before I take this medication? They need to know if you have any of these conditions: blood disorders infection kidney disease liver disease lung or breathing disease, like asthma recent or ongoing radiation therapy an unusual or allergic reaction to gemcitabine, other chemotherapy, other medicines, foods, dyes, or preservatives pregnant or trying to get pregnant breast-feeding How should I use this medication? This drug is given as an infusion into a vein. It is administered in a hospital or clinic by a specially trained health care professional. Talk to your pediatrician regarding the use of this medicine in children. Special care may be needed. Overdosage: If you think you have taken too much of this medicine contact a poison control center or emergency room at once. NOTE: This medicine is only for you. Do not share this medicine with others. What if I miss a dose? It is important not  to miss your dose. Call your doctor or health care professional if you are unable to keep an appointment. What may interact with this medication? medicines to increase blood counts like filgrastim, pegfilgrastim, sargramostim some other chemotherapy drugs like cisplatin vaccines Talk to your doctor or health care professional before taking any of these medicines: acetaminophen aspirin ibuprofen ketoprofen naproxen This list may not describe all possible interactions. Give your health care provider a list of all the medicines, herbs, non-prescription drugs, or dietary supplements you use. Also tell them if you smoke, drink alcohol, or use illegal drugs. Some items may interact with  your medicine. What should I watch for while using this medication? Visit your doctor for checks on your progress. This drug may make you feel generally unwell. This is not uncommon, as chemotherapy can affect healthy cells as well as cancer cells. Report any side effects. Continue your course of treatment even though you feel ill unless your doctor tells you to stop. In some cases, you may be given additional medicines to help with side effects. Follow all directions for their use. Call your doctor or health care professional for advice if you get a fever, chills or sore throat, or other symptoms of a cold or flu. Do not treat yourself. This drug decreases your body's ability to fight infections. Try to avoid being around people who are sick. This medicine may increase your risk to bruise or bleed. Call your doctor or health care professional if you notice any unusual bleeding. Be careful brushing and flossing your teeth or using a toothpick because you may get an infection or bleed more easily. If you have any dental work done, tell your dentist you are receiving this medicine. Avoid taking products that contain aspirin, acetaminophen, ibuprofen, naproxen, or ketoprofen unless instructed by your doctor. These medicines may hide a fever. Do not become pregnant while taking this medicine or for 6 months after stopping it. Women should inform their doctor if they wish to become pregnant or think they might be pregnant. Men should not father a child while taking this medicine and for 3 months after stopping it. There is a potential for serious side effects to an unborn child. Talk to your health care professional or pharmacist for more information. Do not breast-feed an infant while taking this medicine or for at least 1 week after stopping it. Men should inform their doctors if they wish to father a child. This medicine may lower sperm counts. Talk with your doctor or health care professional if you are  concerned about your fertility. What side effects may I notice from receiving this medication? Side effects that you should report to your doctor or health care professional as soon as possible: allergic reactions like skin rash, itching or hives, swelling of the face, lips, or tongue breathing problems pain, redness, or irritation at site where injected signs and symptoms of a dangerous change in heartbeat or heart rhythm like chest pain; dizziness; fast or irregular heartbeat; palpitations; feeling faint or lightheaded, falls; breathing problems signs of decreased platelets or bleeding - bruising, pinpoint red spots on the skin, black, tarry stools, blood in the urine signs of decreased red blood cells - unusually weak or tired, feeling faint or lightheaded, falls signs of infection - fever or chills, cough, sore throat, pain or difficulty passing urine signs and symptoms of kidney injury like trouble passing urine or change in the amount of urine signs and symptoms of liver injury like dark yellow or  brown urine; general ill feeling or flu-like symptoms; light-colored stools; loss of appetite; nausea; right upper belly pain; unusually weak or tired; yellowing of the eyes or skin swelling of ankles, feet, hands Side effects that usually do not require medical attention (report to your doctor or health care professional if they continue or are bothersome): constipation diarrhea hair loss loss of appetite nausea rash vomiting This list may not describe all possible side effects. Call your doctor for medical advice about side effects. You may report side effects to FDA at 1-800-FDA-1088. Where should I keep my medication? This drug is given in a hospital or clinic and will not be stored at home. NOTE: This sheet is a summary. It may not cover all possible information. If you have questions about this medicine, talk to your doctor, pharmacist, or health care provider.  2022 Elsevier/Gold  Standard (2017-06-01 00:00:00)

## 2021-05-22 NOTE — Progress Notes (Signed)
Patient seen by Dr. Benay Spice today ? ?Vitals are within treatment parameters. ? ?Labs reviewed by Dr. Benay Spice and are within treatment parameters. ? ?Per physician team, patient is ready for treatment. Please note that modifications are being made to the treatment plan including dose reduction of Gemzar . ?

## 2021-05-22 NOTE — Progress Notes (Signed)
?[facility] ?OFFICE PROGRESS NOTE ? ? ?Diagnosis: Pancreas cancer ? ?INTERVAL HISTORY:  ? ?Francisco Frank completed cycle 1 gemcitabine/Abraxane 05/11/2021.  He reports malaise for a few days following chemotherapy.  No nausea/vomiting, fever, or rash.  Stable mild peripheral numbness. ? ?Objective: ? ?Vital signs in last 24 hours: ? ?Blood pressure 116/80, pulse 66, temperature 97.8 ?F (36.6 ?C), temperature source Oral, resp. rate 18, height 5\' 8"  (1.727 m), weight 152 lb 9.6 oz (69.2 kg), SpO2 100 %. ?  ? ?HEENT: No thrush or ulcers ?Resp: Lungs clear bilaterally ?Cardio: Regular rate and rhythm ?GI: No hepatosplenomegaly, nontender ?Vascular: No leg edema  ? ?Portacath/PICC-without erythema ? ?Lab Results: ? ?Lab Results  ?Component Value Date  ? WBC 4.6 05/22/2021  ? HGB 9.2 (L) 05/22/2021  ? HCT 28.5 (L) 05/22/2021  ? MCV 93.1 05/22/2021  ? PLT 118 (L) 05/22/2021  ? NEUTROABS 1.8 05/22/2021  ? ? ?CMP  ?Lab Results  ?Component Value Date  ? NA 140 05/06/2021  ? K 4.0 05/06/2021  ? CL 106 05/06/2021  ? CO2 26 05/06/2021  ? GLUCOSE 239 (H) 05/06/2021  ? BUN 8 05/06/2021  ? CREATININE 0.90 05/06/2021  ? CALCIUM 8.7 (L) 05/06/2021  ? PROT 6.1 (L) 05/06/2021  ? ALBUMIN 3.6 05/06/2021  ? AST 42 (H) 05/06/2021  ? ALT 41 05/06/2021  ? ALKPHOS 265 (H) 05/06/2021  ? BILITOT 0.3 05/06/2021  ? GFRNONAA >60 05/06/2021  ? ? ?Lab Results  ?Component Value Date  ? MCN470 490 (H) 05/06/2021  ? ? ?Medications: I have reviewed the patient's current medications. ? ? ?Assessment/Plan: ? ?Pancreas cancer ?Outside CT-apparent pancreas abnormality (we do not have a copy of the report) ?MRI abdomen 11/06/2020-pancreatic body and tail atrophy with duct dilatation up to 9 mm.  Both ducts undergo an abrupt transition in the region of the pancreatic head.  Non border deforming pancreatic head mass measuring 3.2 x 2.6 cm.  No arterial involvement by tumor.  SMV adjacent to presumed tumor without encasement.  Portal vein uninvolved.   No abdominal adenopathy.  Left periaortic 7 mm node not pathologic by size criteria.  No ascites.  Subtle left-sided pericolonic nodule 5 mm. ?EUS/ERCP 11/11/2020-EGD showed gastritis which was biopsied, mucosal changes in the duodenum, mucosal changes in the duodenum sweep (gastric antral and oxyntic mucosa with no specific histopathologic changes; Warthin Starry stain negative for H pylori).  EUS showed an irregular mass in the pancreatic head measuring 26 mm x 30 mm; the outer margins were irregular.  There was sonographic evidence suggesting invasion into the portal vein (manifested by abutment) and the superior mesenteric vein (manifested by abutment); an intact interface was seen between the mass and the superior mesenteric artery and celiac trunk suggesting a lack of invasion.  Endosonographic imaging in the visualized portion of the liver showed no mass.  There was dilatation of the common bile duct and the common hepatic duct.  No malignant appearing lymph nodes were visualized in the celiac region, peripancreatic region, porta hepatis region.  FNA biopsy of the pancreas head mass showed malignant cells consistent with adenocarcinoma.  Biliary access could not be obtained.  ?Cholangiogram 11/12/2020-severe intrahepatic biliary dilatation.  A peripheral right hepatic bile duct was successfully cannulated.  Internal/external biliary drain placed.  Cytology on biliary drain bile showed malignant cells consistent with adenocarcinoma.   ?CT chest 11/13/2020-no evidence of metastatic disease.   ?CT pelvis 11/13/2020-nonspecific circumferential wall thickening of the cecum measuring up to 12 mm  in thickness.  Otherwise no evidence of intrapelvic metastases.   ?11/19/2020-biliary stent placement, biliary drain exchange ?11/28/2020-exchange of internal/external biliary drain secondary to persistent jaundice ?CT abdomen/pelvis 11/28/2020-pancreas head mass, 3.5 x 2.8 cm, no encasement of the celiac trunk or SMA, partial  encasement of the portal venous confluence-at least 180 degrees, partial encasement of the SMV-approximately 180 degrees, ventral extension of tumor abuts the posterior duodenum, no adenopathy, percutaneous biliary stent with decompression of previous dilated intrahepatic and common bile ducts ?Cycle 1 FOLFIRINOX 01/01/2021, irinotecan held ?Cycle 2 FOLFIRINOX 01/13/2021, irinotecan held ?Cycle 3 FOLFIRINOX 01/28/2021 ?Cycle 4 FOLFIRINOX 02/09/2021 ?Cycle 5 FOLFIRINOX 03/03/2021, Ziextenzo ?Cycle 6 FOLFIRINOX 03/25/2021, Ziextenzo ?CT abdomen/pelvis 04/06/2021-no change in pancreas head mass with partial encasement of the portal venous confluence, nonocclusive SMV thrombus, borderline upper abdominal lymph nodes-unchanged, no evidence of liver metastases ?Cycle 7 FOLFIRINOX 04/08/2021,Ziextenzo ?Cycle 8 FOLFIRINOX 04/22/2021, Ziextenzo ?05/01/2021-multidisciplinary consultation at [facility], changed to gemcitabine/Abraxane recommended ?Cycle 1 gemcitabine/Abraxane 05/06/2021 ?Cycle 2 gemcitabine/Abraxane 05/22/2021, gemcitabine dose reduced secondary to thrombocytopenia ?  ?Painless jaundice-internal/external biliary drain placed 11/12/2020.  Biliary stent placed and drain exchange 11/19/2020, exchange of internal/external drain 11/28/2020 ?Weight loss ?Diabetes diagnosed June 2022 ?Change in bowel habits February 2022 ?Colonoscopy 08/06/2020-8 mm polyp in the sigmoid colon, a few small and large mouth diverticula in the sigmoid colon, normal mucosa found in the entire colon with biopsies for histology taken with a cold forceps from the right colon and left colon for evaluation of microscopic colitis (right colon-colonic mucosa with no significant pathologic findings; left colon-colonic mucosa with no significant pathologic findings; sigmoid polyp-tubular adenoma, negative for high-grade dysplasia). 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 ? ? ? ? ?Disposition: ?Francisco Frank tolerated the first cycle of gemcitabine/Abraxane well.  He will complete cycle 2 today.  The platelet count is mildly decreased today.  We will dose reduce the gemcitabine.  He will return for an office visit and chemotherapy in 2 weeks. ? ?Betsy Coder, MD ? ?05/22/2021  ?9:48 AM ? ? ?

## 2021-05-23 LAB — CANCER ANTIGEN 19-9: CA 19-9: 369 U/mL — ABNORMAL HIGH (ref 0–35)

## 2021-05-31 ENCOUNTER — Other Ambulatory Visit: Payer: Self-pay | Admitting: Oncology

## 2021-06-05 ENCOUNTER — Inpatient Hospital Stay: Payer: Medicare Other

## 2021-06-05 ENCOUNTER — Encounter: Payer: Self-pay | Admitting: Nurse Practitioner

## 2021-06-05 ENCOUNTER — Inpatient Hospital Stay (HOSPITAL_BASED_OUTPATIENT_CLINIC_OR_DEPARTMENT_OTHER): Payer: Medicare Other | Admitting: Nurse Practitioner

## 2021-06-05 ENCOUNTER — Other Ambulatory Visit: Payer: Self-pay

## 2021-06-05 ENCOUNTER — Encounter: Payer: Self-pay | Admitting: *Deleted

## 2021-06-05 VITALS — BP 130/78 | HR 76 | Temp 98.8°F | Resp 18 | Ht 68.0 in | Wt 156.0 lb

## 2021-06-05 VITALS — BP 155/82 | HR 51 | Resp 18

## 2021-06-05 DIAGNOSIS — C250 Malignant neoplasm of head of pancreas: Secondary | ICD-10-CM

## 2021-06-05 DIAGNOSIS — Z5111 Encounter for antineoplastic chemotherapy: Secondary | ICD-10-CM | POA: Diagnosis not present

## 2021-06-05 LAB — CMP (CANCER CENTER ONLY)
ALT: 38 U/L (ref 0–44)
AST: 43 U/L — ABNORMAL HIGH (ref 15–41)
Albumin: 3.5 g/dL (ref 3.5–5.0)
Alkaline Phosphatase: 120 U/L (ref 38–126)
Anion gap: 6 (ref 5–15)
BUN: 10 mg/dL (ref 8–23)
CO2: 24 mmol/L (ref 22–32)
Calcium: 8.3 mg/dL — ABNORMAL LOW (ref 8.9–10.3)
Chloride: 108 mmol/L (ref 98–111)
Creatinine: 1.04 mg/dL (ref 0.61–1.24)
GFR, Estimated: >60 mL/min (ref >60)
Glucose, Bld: 195 mg/dL — ABNORMAL HIGH (ref 70–99)
Potassium: 4 mmol/L (ref 3.5–5.1)
Sodium: 138 mmol/L (ref 135–145)
Total Bilirubin: 0.4 mg/dL (ref 0.3–1.2)
Total Protein: 5.7 g/dL — ABNORMAL LOW (ref 6.5–8.1)

## 2021-06-05 LAB — CBC WITH DIFFERENTIAL (CANCER CENTER ONLY)
Abs Immature Granulocytes: 0.01 10*3/uL (ref 0.00–0.07)
Basophils Absolute: 0.1 10*3/uL (ref 0.0–0.1)
Basophils Relative: 1 %
Eosinophils Absolute: 0.4 10*3/uL (ref 0.0–0.5)
Eosinophils Relative: 8 %
HCT: 27.6 % — ABNORMAL LOW (ref 39.0–52.0)
Hemoglobin: 9 g/dL — ABNORMAL LOW (ref 13.0–17.0)
Immature Granulocytes: 0 %
Lymphocytes Relative: 41 %
Lymphs Abs: 1.9 10*3/uL (ref 0.7–4.0)
MCH: 30.9 pg (ref 26.0–34.0)
MCHC: 32.6 g/dL (ref 30.0–36.0)
MCV: 94.8 fL (ref 80.0–100.0)
Monocytes Absolute: 0.8 10*3/uL (ref 0.1–1.0)
Monocytes Relative: 16 %
Neutro Abs: 1.6 10*3/uL — ABNORMAL LOW (ref 1.7–7.7)
Neutrophils Relative %: 34 %
Platelet Count: 159 10*3/uL (ref 150–400)
RBC: 2.91 MIL/uL — ABNORMAL LOW (ref 4.22–5.81)
RDW: 15.6 % — ABNORMAL HIGH (ref 11.5–15.5)
WBC Count: 4.8 10*3/uL (ref 4.0–10.5)
nRBC: 0 % (ref 0.0–0.2)

## 2021-06-05 MED ORDER — SODIUM CHLORIDE 0.9 % IV SOLN: Freq: Once | INTRAVENOUS | Status: AC

## 2021-06-05 MED ORDER — HEPARIN SOD (PORK) LOCK FLUSH 100 UNIT/ML IV SOLN
500.0000 [IU] | Freq: Once | INTRAVENOUS | Status: AC | PRN
  Administered 2021-06-05: 500 [IU]

## 2021-06-05 MED ORDER — SODIUM CHLORIDE 0.9 % IV SOLN
800.0000 mg/m2 | Freq: Once | INTRAVENOUS | Status: AC
  Administered 2021-06-05: 1444 mg via INTRAVENOUS
  Filled 2021-06-05: qty 26.3

## 2021-06-05 MED ORDER — SODIUM CHLORIDE 0.9% FLUSH
10.0000 mL | INTRAVENOUS | Status: DC | PRN
  Administered 2021-06-05: 10 mL

## 2021-06-05 MED ORDER — PACLITAXEL PROTEIN-BOUND CHEMO INJECTION 100 MG
100.0000 mg/m2 | Freq: Once | INTRAVENOUS | Status: AC
  Administered 2021-06-05: 175 mg via INTRAVENOUS
  Filled 2021-06-05: qty 35

## 2021-06-05 MED ORDER — PROCHLORPERAZINE MALEATE 10 MG PO TABS
10.0000 mg | ORAL_TABLET | Freq: Once | ORAL | Status: AC
  Administered 2021-06-05: 10 mg via ORAL
  Filled 2021-06-05: qty 1

## 2021-06-05 NOTE — Progress Notes (Signed)
Patient presents for treatment. RN assessment completed along with the following: ? ?Labs/vitals reviewed - Yes, and within treatment parameters.   ?Weight within 10% of previous measurement - Yes ?Informed consent completed and reflects current therapy/intent - Yes, on date 05/11/2021             ?Provider progress note reviewed - Today's provider note is not yet available. I reviewed the most recent oncology provider progress note in chart dated 05/22/2021. ?Treatment/Antibody/Supportive plan reviewed - Yes, and there are no adjustments needed for today's treatment. ?S&H and other orders reviewed - Yes, and there are no additional orders identified. ?Previous treatment date reviewed - Yes, and the appropriate amount of time has elapsed between treatments. ?Clinic Hand Off Received from - Yes from [place], RN ? ?Patient to proceed with treatment.  ? ?

## 2021-06-05 NOTE — Progress Notes (Signed)
?[facility] ?OFFICE PROGRESS NOTE ? ? ?Diagnosis: Pancreas cancer ? ?INTERVAL HISTORY:  ? ?Mr. Fabel returns as scheduled.  He completed cycle 2 gemcitabine/Abraxane 05/22/2021.  He nausea/vomiting.  No mouth sores.  No significant diarrhea.  Overall stable numbness/tingling in the hands and feet.  No cough or shortness of breath.  No fever or rash after treatment.  He denies bleeding. ? ?Objective: ? ?Vital signs in last 24 hours: ? ?Blood pressure 130/78, pulse 76, temperature 98.8 ?F (37.1 ?C), temperature source Oral, resp. rate 18, height '5\' 8"'$  (1.727 m), weight 156 lb (70.8 kg), SpO2 95 %. ?  ? ?HEENT: No thrush or ulcers. ?Resp: Lungs clear bilaterally. ?Cardio: Regular rate and rhythm. ?GI: Abdomen soft and nontender.  No hepatosplenomegaly. ?Vascular: No leg edema. ?Skin: No rash. ?Port-A-Cath without erythema. ? ? ?Lab Results: ? ?Lab Results  ?Component Value Date  ? WBC 4.8 06/05/2021  ? HGB 9.0 (L) 06/05/2021  ? HCT 27.6 (L) 06/05/2021  ? MCV 94.8 06/05/2021  ? PLT 159 06/05/2021  ? NEUTROABS 1.6 (L) 06/05/2021  ? ? ?Imaging: ? ?No results found. ? ?Medications: I have reviewed the patient's current medications. ? ?Assessment/Plan: ?Pancreas cancer ?Outside CT-apparent pancreas abnormality (we do not have a copy of the report) ?MRI abdomen 11/06/2020-pancreatic body and tail atrophy with duct dilatation up to 9 mm.  Both ducts undergo an abrupt transition in the region of the pancreatic head.  Non border deforming pancreatic head mass measuring 3.2 x 2.6 cm.  No arterial involvement by tumor.  SMV adjacent to presumed tumor without encasement.  Portal vein uninvolved.  No abdominal adenopathy.  Left periaortic 7 mm node not pathologic by size criteria.  No ascites.  Subtle left-sided pericolonic nodule 5 mm. ?EUS/ERCP 11/11/2020-EGD showed gastritis which was biopsied, mucosal changes in the duodenum, mucosal changes in the duodenum sweep (gastric antral and oxyntic mucosa with no specific  histopathologic changes; Warthin Starry stain negative for H pylori).  EUS showed an irregular mass in the pancreatic head measuring 26 mm x 30 mm; the outer margins were irregular.  There was sonographic evidence suggesting invasion into the portal vein (manifested by abutment) and the superior mesenteric vein (manifested by abutment); an intact interface was seen between the mass and the superior mesenteric artery and celiac trunk suggesting a lack of invasion.  Endosonographic imaging in the visualized portion of the liver showed no mass.  There was dilatation of the common bile duct and the common hepatic duct.  No malignant appearing lymph nodes were visualized in the celiac region, peripancreatic region, porta hepatis region.  FNA biopsy of the pancreas head mass showed malignant cells consistent with adenocarcinoma.  Biliary access could not be obtained.  ?Cholangiogram 11/12/2020-severe intrahepatic biliary dilatation.  A peripheral right hepatic bile duct was successfully cannulated.  Internal/external biliary drain placed.  Cytology on biliary drain bile showed malignant cells consistent with adenocarcinoma.   ?CT chest 11/13/2020-no evidence of metastatic disease.   ?CT pelvis 11/13/2020-nonspecific circumferential wall thickening of the cecum measuring up to 12 mm in thickness.  Otherwise no evidence of intrapelvic metastases.   ?11/19/2020-biliary stent placement, biliary drain exchange ?11/28/2020-exchange of internal/external biliary drain secondary to persistent jaundice ?CT abdomen/pelvis 11/28/2020-pancreas head mass, 3.5 x 2.8 cm, no encasement of the celiac trunk or SMA, partial encasement of the portal venous confluence-at least 180 degrees, partial encasement of the SMV-approximately 180 degrees, ventral extension of tumor abuts the posterior duodenum, no adenopathy, percutaneous biliary stent with decompression  of previous dilated intrahepatic and common bile ducts ?Cycle 1 FOLFIRINOX 01/01/2021,  irinotecan held ?Cycle 2 FOLFIRINOX 01/13/2021, irinotecan held ?Cycle 3 FOLFIRINOX 01/28/2021 ?Cycle 4 FOLFIRINOX 02/09/2021 ?Cycle 5 FOLFIRINOX 03/03/2021, Ziextenzo ?Cycle 6 FOLFIRINOX 03/25/2021, Ziextenzo ?CT abdomen/pelvis 04/06/2021-no change in pancreas head mass with partial encasement of the portal venous confluence, nonocclusive SMV thrombus, borderline upper abdominal lymph nodes-unchanged, no evidence of liver metastases ?Cycle 7 FOLFIRINOX 04/08/2021,Ziextenzo ?Cycle 8 FOLFIRINOX 04/22/2021, Ziextenzo ?05/01/2021-multidisciplinary consultation at [facility], changed to gemcitabine/Abraxane recommended ?Cycle 1 gemcitabine/Abraxane 05/06/2021 ?Cycle 2 gemcitabine/Abraxane 05/22/2021, gemcitabine dose reduced secondary to thrombocytopenia ?Cycle 3 gemcitabine/Abraxane 06/05/2021 ?  ?Painless jaundice-internal/external biliary drain placed 11/12/2020.  Biliary stent placed and drain exchange 11/19/2020, exchange of internal/external drain 11/28/2020 ?Weight loss ?Diabetes diagnosed June 2022 ?Change in bowel habits February 2022 ?Colonoscopy 08/06/2020-8 mm polyp in the sigmoid colon, a few small and large mouth diverticula in the sigmoid colon, normal mucosa found in the entire colon with biopsies for histology taken with a cold forceps from the right colon and left colon for evaluation of microscopic colitis (right colon-colonic mucosa with no significant pathologic findings; left colon-colonic mucosa with no significant pathologic findings; sigmoid polyp-tubular adenoma, negative for high-grade dysplasia). 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  ?  ? ?Disposition: Mr. Dave appears stable.  He has completed 2 cycles of gemcitabine/Abraxane.  He is tolerating well.  Plan to proceed with cycle 3 today as scheduled. ? ?CBC and chemistry panel from today reviewed.  Labs adequate to proceed with treatment.  He has mild  neutropenia, not significantly changed as compared to 2 weeks ago.  He will contact the office with signs of infection. ? ?He will return for lab, follow-up, cycle 4 gemcitabine/Abraxane in 2 weeks.  He will contact the office in the interim as outlined above or with any other problems. ? ? ? ?Ned Card ANP/GNP-BC  ? ?06/05/2021  ?9:10 AM ? ? ? ? ? ? ? ?

## 2021-06-05 NOTE — Patient Instructions (Signed)
[facility]   ?Discharge Instructions: ?Thank you for choosing [facility] to provide your oncology and hematology care.  ? ?If you have a lab appointment with the [facility], please go directly to the [facility] and check in at the registration area. ?  ?Wear comfortable clothing and clothing appropriate for easy access to any Portacath or PICC line.  ? ?We strive to give you quality time with your provider. You may need to reschedule your appointment if you arrive late (15 or more minutes).  Arriving late affects you and other patients whose appointments are after yours.  Also, if you miss three or more appointments without notifying the office, you may be dismissed from the clinic at the provider?s discretion.    ?  ?For prescription refill requests, have your pharmacy contact our office and allow 72 hours for refills to be completed.   ? ?Today you received the following chemotherapy and/or immunotherapy agents Paclitaxel-protein bound (ABRAXANE) & Gemcitabine (GEMZAR).    ?  ?To help prevent nausea and vomiting after your treatment, we encourage you to take your nausea medication as directed. ? ?BELOW ARE SYMPTOMS THAT SHOULD BE REPORTED IMMEDIATELY: ?*FEVER GREATER THAN 100.4 F (38 ?C) OR HIGHER ?*CHILLS OR SWEATING ?*NAUSEA AND VOMITING THAT IS NOT CONTROLLED WITH YOUR NAUSEA MEDICATION ?*UNUSUAL SHORTNESS OF BREATH ?*UNUSUAL BRUISING OR BLEEDING ?*URINARY PROBLEMS (pain or burning when urinating, or frequent urination) ?*BOWEL PROBLEMS (unusual diarrhea, constipation, pain near the anus) ?TENDERNESS IN MOUTH AND THROAT WITH OR WITHOUT PRESENCE OF ULCERS (sore throat, sores in mouth, or a toothache) ?UNUSUAL RASH, SWELLING OR PAIN  ?UNUSUAL VAGINAL DISCHARGE OR ITCHING  ? ?Items with * indicate a potential emergency and should be followed up as soon as possible or go to the Emergency Department if any problems should occur. ? ?Please show the CHEMOTHERAPY ALERT  CARD or IMMUNOTHERAPY ALERT CARD at check-in to the Emergency Department and triage nurse. ? ?Should you have questions after your visit or need to cancel or reschedule your appointment, please contact [facility]  Dept: [phone]  and follow the prompts.  Office hours are 8:00 a.m. to 4:30 p.m. Monday - Friday. Please note that voicemails left after 4:00 p.m. may not be returned until the following business day.  We are closed weekends and major holidays. You have access to a nurse at all times for urgent questions. Please call the main number to the clinic Dept: [phone] and follow the prompts. ? ? ?For any non-urgent questions, you may also contact your provider using MyChart. We now offer e-Visits for anyone 11 and older to request care online for non-urgent symptoms. For details visit [email or web address]. ?  ?Also download the MyChart app! Go to the app store, search "MyChart", open the app, select [facility], and log in with your MyChart username and password. ? ?Due to Covid, a mask is required upon entering the hospital/clinic. If you do not have a mask, one will be given to you upon arrival. For doctor visits, patients may have 1 support person aged 24 or older with them. For treatment visits, patients cannot have anyone with them due to current Covid guidelines and our immunocompromised population.  ? ?Nanoparticle Albumin-Bound Paclitaxel injection ?What is this medication? ?NANOPARTICLE ALBUMIN-BOUND PACLITAXEL (Na no PAHR ti kuhl  al BYOO muhn-bound  PAK li TAX el) is a chemotherapy drug. It targets fast dividing cells, like cancer cells, and causes these cells to die.  This medicine is used to treat advanced breast cancer, lung cancer, and pancreatic cancer. ?This medicine may be used for other purposes; ask your health care provider or pharmacist if you have questions. ?COMMON BRAND NAME(S): Abraxane ?What should I tell my care team before I take this  medication? ?They need to know if you have any of these conditions: ?kidney disease ?liver disease ?low blood counts, like low white cell, platelet, or red cell counts ?lung or breathing disease, like asthma ?tingling of the fingers or toes, or other nerve disorder ?an unusual or allergic reaction to paclitaxel, albumin, other chemotherapy, other medicines, foods, dyes, or preservatives ?pregnant or trying to get pregnant ?breast-feeding ?How should I use this medication? ?This drug is given as an infusion into a vein. It is administered in a hospital or clinic by a specially trained health care professional. ?Talk to your pediatrician regarding the use of this medicine in children. Special care may be needed. ?Overdosage: If you think you have taken too much of this medicine contact a poison control center or emergency room at once. ?NOTE: This medicine is only for you. Do not share this medicine with others. ?What if I miss a dose? ?It is important not to miss your dose. Call your doctor or health care professional if you are unable to keep an appointment. ?What may interact with this medication? ?This medicine may interact with the following medications: ?antiviral medicines for hepatitis, HIV or AIDS ?certain antibiotics like erythromycin and clarithromycin ?certain medicines for fungal infections like ketoconazole and itraconazole ?certain medicines for seizures like carbamazepine, phenobarbital, phenytoin ?gemfibrozil ?nefazodone ?rifampin ?St. John's wort ?This list may not describe all possible interactions. Give your health care provider a list of all the medicines, herbs, non-prescription drugs, or dietary supplements you use. Also tell them if you smoke, drink alcohol, or use illegal drugs. Some items may interact with your medicine. ?What should I watch for while using this medication? ?Your condition will be monitored carefully while you are receiving this medicine. You will need important blood work  done while you are taking this medicine. ?This medicine can cause serious allergic reactions. If you experience allergic reactions like skin rash, itching or hives, swelling of the face, lips, or tongue, tell your doctor or health care professional right away. ?In some cases, you may be given additional medicines to help with side effects. Follow all directions for their use. ?This drug may make you feel generally unwell. This is not uncommon, as chemotherapy can affect healthy cells as well as cancer cells. Report any side effects. Continue your course of treatment even though you feel ill unless your doctor tells you to stop. ?Call your doctor or health care professional for advice if you get a fever, chills or sore throat, or other symptoms of a cold or flu. Do not treat yourself. This drug decreases your body's ability to fight infections. Try to avoid being around people who are sick. ?This medicine may increase your risk to bruise or bleed. Call your doctor or health care professional if you notice any unusual bleeding. ?Be careful brushing and flossing your teeth or using a toothpick because you may get an infection or bleed more easily. If you have any dental work done, tell your dentist you are receiving this medicine. ?Avoid taking products that contain aspirin, acetaminophen, ibuprofen, naproxen, or ketoprofen unless instructed by your doctor. These medicines may hide a fever. ?Do not become pregnant while taking this medicine or for 6 months after  stopping it. Women should inform their doctor if they wish to become pregnant or think they might be pregnant. Men should not father a child while taking this medicine or for 3 months after stopping it. There is a potential for serious side effects to an unborn child. Talk to your health care professional or pharmacist for more information. ?Do not breast-feed an infant while taking this medicine or for 2 weeks after stopping it. ?This medicine may interfere  with the ability to get pregnant or to father a child. You should talk to your doctor or health care professional if you are concerned about your fertility. ?What side effects may I notice from receiving this medicat

## 2021-06-05 NOTE — Progress Notes (Signed)
Patient seen by Ned Card NP today ? ?Vitals are within treatment parameters. ? ?Labs reviewed by Ned Card NP and are within treatment parameters. ?OK to treat w/ANC 1.6--gemzar was already dose reduced w/prior cycle. ? ?Per physician team, patient is ready for treatment and there are NO modifications to the treatment plan.  ?

## 2021-06-06 LAB — CANCER ANTIGEN 19-9: CA 19-9: 213 U/mL — ABNORMAL HIGH (ref 0–35)

## 2021-06-14 ENCOUNTER — Other Ambulatory Visit: Payer: Self-pay | Admitting: Oncology

## 2021-06-19 ENCOUNTER — Inpatient Hospital Stay: Payer: Medicare Other

## 2021-06-19 ENCOUNTER — Inpatient Hospital Stay: Payer: Medicare Other | Admitting: Oncology

## 2021-06-19 ENCOUNTER — Encounter: Payer: Self-pay | Admitting: *Deleted

## 2021-06-19 VITALS — BP 132/86 | HR 71 | Temp 97.7°F | Resp 20 | Ht 68.0 in | Wt 155.4 lb

## 2021-06-19 DIAGNOSIS — C250 Malignant neoplasm of head of pancreas: Secondary | ICD-10-CM

## 2021-06-19 DIAGNOSIS — Z5111 Encounter for antineoplastic chemotherapy: Secondary | ICD-10-CM | POA: Diagnosis not present

## 2021-06-19 LAB — CBC WITH DIFFERENTIAL (CANCER CENTER ONLY)
Abs Immature Granulocytes: 0.02 10*3/uL (ref 0.00–0.07)
Basophils Absolute: 0.1 10*3/uL (ref 0.0–0.1)
Basophils Relative: 1 %
Eosinophils Absolute: 0.2 10*3/uL (ref 0.0–0.5)
Eosinophils Relative: 3 %
HCT: 29.2 % — ABNORMAL LOW (ref 39.0–52.0)
Hemoglobin: 9.6 g/dL — ABNORMAL LOW (ref 13.0–17.0)
Immature Granulocytes: 0 %
Lymphocytes Relative: 19 %
Lymphs Abs: 1.6 10*3/uL (ref 0.7–4.0)
MCH: 31.2 pg (ref 26.0–34.0)
MCHC: 32.9 g/dL (ref 30.0–36.0)
MCV: 94.8 fL (ref 80.0–100.0)
Monocytes Absolute: 1 10*3/uL (ref 0.1–1.0)
Monocytes Relative: 12 %
Neutro Abs: 5.2 10*3/uL (ref 1.7–7.7)
Neutrophils Relative %: 65 %
Platelet Count: 198 10*3/uL (ref 150–400)
RBC: 3.08 MIL/uL — ABNORMAL LOW (ref 4.22–5.81)
RDW: 15.2 % (ref 11.5–15.5)
WBC Count: 8.1 10*3/uL (ref 4.0–10.5)
nRBC: 0 % (ref 0.0–0.2)

## 2021-06-19 LAB — CMP (CANCER CENTER ONLY)
ALT: 21 U/L (ref 0–44)
AST: 23 U/L (ref 15–41)
Albumin: 3.7 g/dL (ref 3.5–5.0)
Alkaline Phosphatase: 122 U/L (ref 38–126)
Anion gap: 6 (ref 5–15)
BUN: 10 mg/dL (ref 8–23)
CO2: 27 mmol/L (ref 22–32)
Calcium: 8.7 mg/dL — ABNORMAL LOW (ref 8.9–10.3)
Chloride: 106 mmol/L (ref 98–111)
Creatinine: 0.92 mg/dL (ref 0.61–1.24)
GFR, Estimated: >60 mL/min (ref >60)
Glucose, Bld: 257 mg/dL — ABNORMAL HIGH (ref 70–99)
Potassium: 3.7 mmol/L (ref 3.5–5.1)
Sodium: 139 mmol/L (ref 135–145)
Total Bilirubin: 0.6 mg/dL (ref 0.3–1.2)
Total Protein: 6.2 g/dL — ABNORMAL LOW (ref 6.5–8.1)

## 2021-06-19 MED ORDER — SODIUM CHLORIDE 0.9% FLUSH
10.0000 mL | INTRAVENOUS | Status: DC | PRN
  Administered 2021-06-19: 10 mL

## 2021-06-19 MED ORDER — HEPARIN SOD (PORK) LOCK FLUSH 100 UNIT/ML IV SOLN
500.0000 [IU] | Freq: Once | INTRAVENOUS | Status: AC | PRN
  Administered 2021-06-19: 500 [IU]

## 2021-06-19 MED ORDER — PACLITAXEL PROTEIN-BOUND CHEMO INJECTION 100 MG
100.0000 mg/m2 | Freq: Once | INTRAVENOUS | Status: AC
  Administered 2021-06-19: 175 mg via INTRAVENOUS
  Filled 2021-06-19: qty 35

## 2021-06-19 MED ORDER — PROCHLORPERAZINE MALEATE 10 MG PO TABS
10.0000 mg | ORAL_TABLET | Freq: Once | ORAL | Status: AC
  Administered 2021-06-19: 10 mg via ORAL
  Filled 2021-06-19: qty 1

## 2021-06-19 MED ORDER — SODIUM CHLORIDE 0.9 % IV SOLN
800.0000 mg/m2 | Freq: Once | INTRAVENOUS | Status: AC
  Administered 2021-06-19: 1444 mg via INTRAVENOUS
  Filled 2021-06-19: qty 11.68

## 2021-06-19 MED ORDER — SODIUM CHLORIDE 0.9 % IV SOLN: Freq: Once | INTRAVENOUS | Status: AC

## 2021-06-19 NOTE — Patient Instructions (Signed)
[facility]   ?Discharge Instructions: ?Thank you for choosing [facility] to provide your oncology and hematology care.  ? ?If you have a lab appointment with the [facility], please go directly to the [facility] and check in at the registration area. ?  ?Wear comfortable clothing and clothing appropriate for easy access to any Portacath or PICC line.  ? ?We strive to give you quality time with your provider. You may need to reschedule your appointment if you arrive late (15 or more minutes).  Arriving late affects you and other patients whose appointments are after yours.  Also, if you miss three or more appointments without notifying the office, you may be dismissed from the clinic at the provider?s discretion.    ?  ?For prescription refill requests, have your pharmacy contact our office and allow 72 hours for refills to be completed.   ? ?Today you received the following chemotherapy and/or immunotherapy agents Abraxane, Gemzar    ?  ?To help prevent nausea and vomiting after your treatment, we encourage you to take your nausea medication as directed. ? ?BELOW ARE SYMPTOMS THAT SHOULD BE REPORTED IMMEDIATELY: ?*FEVER GREATER THAN 100.4 F (38 ?C) OR HIGHER ?*CHILLS OR SWEATING ?*NAUSEA AND VOMITING THAT IS NOT CONTROLLED WITH YOUR NAUSEA MEDICATION ?*UNUSUAL SHORTNESS OF BREATH ?*UNUSUAL BRUISING OR BLEEDING ?*URINARY PROBLEMS (pain or burning when urinating, or frequent urination) ?*BOWEL PROBLEMS (unusual diarrhea, constipation, pain near the anus) ?TENDERNESS IN MOUTH AND THROAT WITH OR WITHOUT PRESENCE OF ULCERS (sore throat, sores in mouth, or a toothache) ?UNUSUAL RASH, SWELLING OR PAIN  ?UNUSUAL VAGINAL DISCHARGE OR ITCHING  ? ?Items with * indicate a potential emergency and should be followed up as soon as possible or go to the Emergency Department if any problems should occur. ? ?Please show the CHEMOTHERAPY ALERT CARD or IMMUNOTHERAPY ALERT CARD at  check-in to the Emergency Department and triage nurse. ? ?Should you have questions after your visit or need to cancel or reschedule your appointment, please contact [facility]  Dept: [phone]  and follow the prompts.  Office hours are 8:00 a.m. to 4:30 p.m. Monday - Friday. Please note that voicemails left after 4:00 p.m. may not be returned until the following business day.  We are closed weekends and major holidays. You have access to a nurse at all times for urgent questions. Please call the main number to the clinic Dept: [phone] and follow the prompts. ? ? ?For any non-urgent questions, you may also contact your provider using MyChart. We now offer e-Visits for anyone 79 and older to request care online for non-urgent symptoms. For details visit [email or web address]. ?  ?Also download the MyChart app! Go to the app store, search "MyChart", open the app, select [facility], and log in with your MyChart username and password. ? ?Due to Covid, a mask is required upon entering the hospital/clinic. If you do not have a mask, one will be given to you upon arrival. For doctor visits, patients may have 1 support person aged 30 or older with them. For treatment visits, patients cannot have anyone with them due to current Covid guidelines and our immunocompromised population.  ? ?

## 2021-06-19 NOTE — Progress Notes (Signed)
?[place] ?OFFICE PROGRESS NOTE ? ? ?Diagnosis: Pancreas cancer ? ?INTERVAL HISTORY:  ? ?Francisco Frank completed another cycle of gemcitabine/Abraxane 06/05/2021.  No fever or rash.  He reports malaise following chemotherapy.  He has increased numbness and tingling in the hands and feet.  This does not interfere with activity.  The symptoms have progressed since he started the gemcitabine/Abraxane.  Good appetite. ? ?Objective: ? ?Vital signs in last 24 hours: ? ?Blood pressure 132/86, pulse 71, temperature 97.7 ?F (36.5 ?C), temperature source Oral, resp. rate 20, height '5\' 8"'$  (1.727 m), weight 155 lb 6.4 oz (70.5 kg), SpO2 100 %. ?  ? ?HEENT: No thrush or ulcers ?Resp: Lungs clear bilaterally ?Cardio: Regular rate and rhythm ?GI: No hepatosplenomegaly ?Vascular: No leg edema ?Neuro: Mild loss of vibratory sense at the fingertips bilaterally  ? ?Portacath/PICC-without erythema ? ?Lab Results: ? ?Lab Results  ?Component Value Date  ? WBC 8.1 06/19/2021  ? HGB 9.6 (L) 06/19/2021  ? HCT 29.2 (L) 06/19/2021  ? MCV 94.8 06/19/2021  ? PLT 198 06/19/2021  ? NEUTROABS 5.2 06/19/2021  ? ? ?CMP  ?Lab Results  ?Component Value Date  ? NA 139 06/19/2021  ? K 3.7 06/19/2021  ? CL 106 06/19/2021  ? CO2 27 06/19/2021  ? GLUCOSE 257 (H) 06/19/2021  ? BUN 10 06/19/2021  ? CREATININE 0.92 06/19/2021  ? CALCIUM 8.7 (L) 06/19/2021  ? PROT 6.2 (L) 06/19/2021  ? ALBUMIN 3.7 06/19/2021  ? AST 23 06/19/2021  ? ALT 21 06/19/2021  ? ALKPHOS 122 06/19/2021  ? BILITOT 0.6 06/19/2021  ? GFRNONAA >60 06/19/2021  ? ? ?Lab Results  ?Component Value Date  ? ZGY174 213 (H) 06/05/2021  ? ? ?Medications: I have reviewed the patient's current medications. ? ? ?Assessment/Plan: ?Pancreas cancer ?Outside CT-apparent pancreas abnormality (we do not have a copy of the report) ?MRI abdomen 11/06/2020-pancreatic body and tail atrophy with duct dilatation up to 9 mm.  Both ducts undergo an abrupt transition in the region of the pancreatic head.   Non border deforming pancreatic head mass measuring 3.2 x 2.6 cm.  No arterial involvement by tumor.  SMV adjacent to presumed tumor without encasement.  Portal vein uninvolved.  No abdominal adenopathy.  Left periaortic 7 mm node not pathologic by size criteria.  No ascites.  Subtle left-sided pericolonic nodule 5 mm. ?EUS/ERCP 11/11/2020-EGD showed gastritis which was biopsied, mucosal changes in the duodenum, mucosal changes in the duodenum sweep (gastric antral and oxyntic mucosa with no specific histopathologic changes; Warthin Starry stain negative for H pylori).  EUS showed an irregular mass in the pancreatic head measuring 26 mm x 30 mm; the outer margins were irregular.  There was sonographic evidence suggesting invasion into the portal vein (manifested by abutment) and the superior mesenteric vein (manifested by abutment); an intact interface was seen between the mass and the superior mesenteric artery and celiac trunk suggesting a lack of invasion.  Endosonographic imaging in the visualized portion of the liver showed no mass.  There was dilatation of the common bile duct and the common hepatic duct.  No malignant appearing lymph nodes were visualized in the celiac region, peripancreatic region, porta hepatis region.  FNA biopsy of the pancreas head mass showed malignant cells consistent with adenocarcinoma.  Biliary access could not be obtained.  ?Cholangiogram 11/12/2020-severe intrahepatic biliary dilatation.  A peripheral right hepatic bile duct was successfully cannulated.  Internal/external biliary drain placed.  Cytology on biliary drain bile showed malignant cells  consistent with adenocarcinoma.   ?CT chest 11/13/2020-no evidence of metastatic disease.   ?CT pelvis 11/13/2020-nonspecific circumferential wall thickening of the cecum measuring up to 12 mm in thickness.  Otherwise no evidence of intrapelvic metastases.   ?11/19/2020-biliary stent placement, biliary drain exchange ?11/28/2020-exchange of  internal/external biliary drain secondary to persistent jaundice ?CT abdomen/pelvis 11/28/2020-pancreas head mass, 3.5 x 2.8 cm, no encasement of the celiac trunk or SMA, partial encasement of the portal venous confluence-at least 180 degrees, partial encasement of the SMV-approximately 180 degrees, ventral extension of tumor abuts the posterior duodenum, no adenopathy, percutaneous biliary stent with decompression of previous dilated intrahepatic and common bile ducts ?Cycle 1 FOLFIRINOX 01/01/2021, irinotecan held ?Cycle 2 FOLFIRINOX 01/13/2021, irinotecan held ?Cycle 3 FOLFIRINOX 01/28/2021 ?Cycle 4 FOLFIRINOX 02/09/2021 ?Cycle 5 FOLFIRINOX 03/03/2021, Ziextenzo ?Cycle 6 FOLFIRINOX 03/25/2021, Ziextenzo ?CT abdomen/pelvis 04/06/2021-no change in pancreas head mass with partial encasement of the portal venous confluence, nonocclusive SMV thrombus, borderline upper abdominal lymph nodes-unchanged, no evidence of liver metastases ?Cycle 7 FOLFIRINOX 04/08/2021,Ziextenzo ?Cycle 8 FOLFIRINOX 04/22/2021, Ziextenzo ?05/01/2021-multidisciplinary consultation at [facility], changed to gemcitabine/Abraxane recommended ?Cycle 1 gemcitabine/Abraxane 05/06/2021 ?Cycle 2 gemcitabine/Abraxane 05/22/2021, gemcitabine dose reduced secondary to thrombocytopenia ?Cycle 3 gemcitabine/Abraxane 06/05/2021 ?Cycle 4 gemcitabine/Abraxane 06/19/2021 ?  ?Painless jaundice-internal/external biliary drain placed 11/12/2020.  Biliary stent placed and drain exchange 11/19/2020, exchange of internal/external drain 11/28/2020 ?Weight loss ?Diabetes diagnosed June 2022 ?Change in bowel habits February 2022 ?Colonoscopy 08/06/2020-8 mm polyp in the sigmoid colon, a few small and large mouth diverticula in the sigmoid colon, normal mucosa found in the entire colon with biopsies for histology taken with a cold forceps from the right colon and left colon for evaluation of microscopic colitis (right colon-colonic mucosa with no significant pathologic findings; left  colon-colonic mucosa with no significant pathologic findings; sigmoid polyp-tubular adenoma, negative for high-grade dysplasia). 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  ?  ? ? ?Disposition: ?Francisco Frank appears stable.  He has completed 3 treatments with gemcitabine/Abraxane.  He is tolerated the treatment well, but he is developing increased neuropathy symptoms.  We discussed the risk of continuing Abraxane therapy.  He understands the potential for progressive and long-lasting neuropathy.  He would like to proceed with gemcitabine/Abraxane today. ? ?The CA 19-9 is lower. ? ?He is scheduled for follow-up at [facility] on 07/03/2021.  He will return for an office visit with the plan to continue gemcitabine/Abraxane on 07/13/2021. ? ?Betsy Coder, MD ? ?06/19/2021  ?10:02 AM ? ? ?

## 2021-06-19 NOTE — Progress Notes (Signed)
Patient presents for treatment. RN assessment completed along with the following: ? ?Labs/vitals reviewed - Yes, and within treatment parameters.   ?Weight within 10% of previous measurement - Yes ?Informed consent completed and reflects current therapy/intent - Yes, on date 05/11/21             ?Provider progress note reviewed - Yes, today's provider note was reviewed. ?Treatment/Antibody/Supportive plan reviewed - Yes, and there are no adjustments needed for today's treatment. ?S&H and other orders reviewed - Yes, and there are no additional orders identified. ?Previous treatment date reviewed - Yes, and the appropriate amount of time has elapsed between treatments. ?Clinic Hand Off Received from - susan C, RN ? ? ?Patient to proceed with treatment.  ? ?

## 2021-06-19 NOTE — Progress Notes (Signed)
Patient seen by Dr. Sherrill today ? ?Vitals are within treatment parameters. ? ?Labs reviewed by Dr. Sherrill and are within treatment parameters. ? ?Per physician team, patient is ready for treatment and there are NO modifications to the treatment plan.  ?

## 2021-06-20 LAB — CANCER ANTIGEN 19-9: CA 19-9: 170 U/mL — ABNORMAL HIGH (ref 0–35)

## 2021-06-24 ENCOUNTER — Other Ambulatory Visit: Payer: Self-pay | Admitting: Oncology

## 2021-07-10 ENCOUNTER — Other Ambulatory Visit: Payer: Medicare Other

## 2021-07-10 ENCOUNTER — Ambulatory Visit: Payer: Medicare Other | Admitting: Oncology

## 2021-07-10 ENCOUNTER — Ambulatory Visit: Payer: Medicare Other

## 2021-07-11 ENCOUNTER — Other Ambulatory Visit: Payer: Self-pay | Admitting: Oncology

## 2021-07-13 ENCOUNTER — Inpatient Hospital Stay: Payer: Medicare Other | Admitting: Nurse Practitioner

## 2021-07-13 ENCOUNTER — Inpatient Hospital Stay: Payer: Medicare Other

## 2021-07-13 ENCOUNTER — Inpatient Hospital Stay: Payer: Medicare Other | Attending: Nurse Practitioner

## 2021-07-13 ENCOUNTER — Encounter: Payer: Self-pay | Admitting: *Deleted

## 2021-07-13 ENCOUNTER — Encounter: Payer: Self-pay | Admitting: Nurse Practitioner

## 2021-07-13 VITALS — BP 114/68 | HR 63 | Temp 98.2°F | Resp 18 | Ht 68.0 in | Wt 151.8 lb

## 2021-07-13 DIAGNOSIS — C250 Malignant neoplasm of head of pancreas: Secondary | ICD-10-CM | POA: Diagnosis present

## 2021-07-13 DIAGNOSIS — Z5111 Encounter for antineoplastic chemotherapy: Secondary | ICD-10-CM | POA: Insufficient documentation

## 2021-07-13 LAB — CMP (CANCER CENTER ONLY)
ALT: 23 U/L (ref 0–44)
AST: 27 U/L (ref 15–41)
Albumin: 4 g/dL (ref 3.5–5.0)
Alkaline Phosphatase: 140 U/L — ABNORMAL HIGH (ref 38–126)
Anion gap: 9 (ref 5–15)
BUN: 18 mg/dL (ref 8–23)
CO2: 23 mmol/L (ref 22–32)
Calcium: 8.9 mg/dL (ref 8.9–10.3)
Chloride: 105 mmol/L (ref 98–111)
Creatinine: 1.12 mg/dL (ref 0.61–1.24)
GFR, Estimated: >60 mL/min (ref >60)
Glucose, Bld: 385 mg/dL — ABNORMAL HIGH (ref 70–99)
Potassium: 4 mmol/L (ref 3.5–5.1)
Sodium: 137 mmol/L (ref 135–145)
Total Bilirubin: 0.5 mg/dL (ref 0.3–1.2)
Total Protein: 6.8 g/dL (ref 6.5–8.1)

## 2021-07-13 LAB — CBC WITH DIFFERENTIAL (CANCER CENTER ONLY)
Abs Immature Granulocytes: 0.02 10*3/uL (ref 0.00–0.07)
Basophils Absolute: 0.1 10*3/uL (ref 0.0–0.1)
Basophils Relative: 2 %
Eosinophils Absolute: 0.2 10*3/uL (ref 0.0–0.5)
Eosinophils Relative: 3 %
HCT: 32.5 % — ABNORMAL LOW (ref 39.0–52.0)
Hemoglobin: 10.5 g/dL — ABNORMAL LOW (ref 13.0–17.0)
Immature Granulocytes: 0 %
Lymphocytes Relative: 25 %
Lymphs Abs: 1.7 10*3/uL (ref 0.7–4.0)
MCH: 30.9 pg (ref 26.0–34.0)
MCHC: 32.3 g/dL (ref 30.0–36.0)
MCV: 95.6 fL (ref 80.0–100.0)
Monocytes Absolute: 0.5 10*3/uL (ref 0.1–1.0)
Monocytes Relative: 7 %
Neutro Abs: 4.4 10*3/uL (ref 1.7–7.7)
Neutrophils Relative %: 63 %
Platelet Count: 239 10*3/uL (ref 150–400)
RBC: 3.4 MIL/uL — ABNORMAL LOW (ref 4.22–5.81)
RDW: 14.4 % (ref 11.5–15.5)
WBC Count: 6.9 10*3/uL (ref 4.0–10.5)
nRBC: 0 % (ref 0.0–0.2)

## 2021-07-13 MED ORDER — SODIUM CHLORIDE 0.9 % IV SOLN
800.0000 mg/m2 | Freq: Once | INTRAVENOUS | Status: AC
  Administered 2021-07-13: 1444 mg via INTRAVENOUS
  Filled 2021-07-13: qty 26.3

## 2021-07-13 MED ORDER — HEPARIN SOD (PORK) LOCK FLUSH 100 UNIT/ML IV SOLN
500.0000 [IU] | Freq: Once | INTRAVENOUS | Status: AC | PRN
  Administered 2021-07-13: 500 [IU]

## 2021-07-13 MED ORDER — SODIUM CHLORIDE 0.9 % IV SOLN: Freq: Once | INTRAVENOUS | Status: AC

## 2021-07-13 MED ORDER — PROCHLORPERAZINE MALEATE 10 MG PO TABS
10.0000 mg | ORAL_TABLET | Freq: Once | ORAL | Status: AC
  Administered 2021-07-13: 10 mg via ORAL
  Filled 2021-07-13: qty 1

## 2021-07-13 MED ORDER — SODIUM CHLORIDE 0.9% FLUSH
10.0000 mL | INTRAVENOUS | Status: DC | PRN
  Administered 2021-07-13: 10 mL

## 2021-07-13 NOTE — Patient Instructions (Signed)

## 2021-07-13 NOTE — Progress Notes (Signed)
Patient presents for treatment. RN assessment completed along with the following: ? ?Labs/vitals reviewed - Yes, and within treatment parameters.  See Merceda Elks, RN note.  ?Weight within 10% of previous measurement - Yes ?Informed consent completed and reflects current therapy/intent - Yes, on date 05/11/21             ?Provider progress note reviewed - Yes, today's provider's note reviewed.  ?Treatment/Antibody/Supportive plan reviewed - Yes, and there are no adjustments needed for today's treatment. ?S&H and other orders reviewed - Yes, and there are no additional orders identified. ?Previous treatment date reviewed - Yes, and the appropriate amount of time has elapsed between treatments. ?Clinic Hand Off Received from - Merceda Elks, RN ? ?Patient to proceed with treatment.   ?

## 2021-07-13 NOTE — Progress Notes (Addendum)
Patient seen by Ned Card NP today ? ?Vitals are within treatment parameters. ? ?Labs reviewed by Ned Card NP and are within treatment parameters. No need to treat elevated glucose today. He ate a high carb breakfast. Has sliding scale at home. ? ?Per physician team, will only receive Gemzar today.  ?

## 2021-07-13 NOTE — Progress Notes (Signed)
?[place] ?OFFICE PROGRESS NOTE ? ? ?Diagnosis: Pancreas cancer ? ?INTERVAL HISTORY:  ? ?Francisco Frank returns as scheduled.  He completed cycle 4 gemcitabine/Abraxane 06/19/2021.  He denies nausea/vomiting.  No mouth sores.  No diarrhea.  No fever or rash after treatment.  No abdominal pain.  Main complaint is fatigue.  He notes increased neuropathy symptoms in the hands and feet.  Hands feel cold and tingly on a consistent basis.  He is dropping items periodically.  The numbness in his feet is affecting his ability to walk.  He is unable to feel his feet while driving. ? ?Objective: ? ?Vital signs in last 24 hours: ? ?Blood pressure 114/68, pulse 63, temperature 98.2 ?F (36.8 ?C), temperature source Oral, resp. rate 18, height '5\' 8"'$  (1.727 m), weight 151 lb 12.8 oz (68.9 kg), SpO2 100 %. ?  ? ?HEENT: No thrush or ulcers. ?Resp: Lungs clear bilaterally. ?Cardio: Regular rate and rhythm. ?GI: Abdomen soft and nontender.  No hepatosplenomegaly. ?Vascular: No leg edema. ?Port-A-Cath without erythema. ? ?Lab Results: ? ?Lab Results  ?Component Value Date  ? WBC 6.9 07/13/2021  ? HGB 10.5 (L) 07/13/2021  ? HCT 32.5 (L) 07/13/2021  ? MCV 95.6 07/13/2021  ? PLT 239 07/13/2021  ? NEUTROABS 4.4 07/13/2021  ? ? ?Imaging: ? ?No results found. ? ?Medications: I have reviewed the patient's current medications. ? ?Assessment/Plan: ?Pancreas cancer ?Outside CT-apparent pancreas abnormality (we do not have a copy of the report) ?MRI abdomen 11/06/2020-pancreatic body and tail atrophy with duct dilatation up to 9 mm.  Both ducts undergo an abrupt transition in the region of the pancreatic head.  Non border deforming pancreatic head mass measuring 3.2 x 2.6 cm.  No arterial involvement by tumor.  SMV adjacent to presumed tumor without encasement.  Portal vein uninvolved.  No abdominal adenopathy.  Left periaortic 7 mm node not pathologic by size criteria.  No ascites.  Subtle left-sided pericolonic nodule 5  mm. ?EUS/ERCP 11/11/2020-EGD showed gastritis which was biopsied, mucosal changes in the duodenum, mucosal changes in the duodenum sweep (gastric antral and oxyntic mucosa with no specific histopathologic changes; Warthin Starry stain negative for H pylori).  EUS showed an irregular mass in the pancreatic head measuring 26 mm x 30 mm; the outer margins were irregular.  There was sonographic evidence suggesting invasion into the portal vein (manifested by abutment) and the superior mesenteric vein (manifested by abutment); an intact interface was seen between the mass and the superior mesenteric artery and celiac trunk suggesting a lack of invasion.  Endosonographic imaging in the visualized portion of the liver showed no mass.  There was dilatation of the common bile duct and the common hepatic duct.  No malignant appearing lymph nodes were visualized in the celiac region, peripancreatic region, porta hepatis region.  FNA biopsy of the pancreas head mass showed malignant cells consistent with adenocarcinoma.  Biliary access could not be obtained.  ?Cholangiogram 11/12/2020-severe intrahepatic biliary dilatation.  A peripheral right hepatic bile duct was successfully cannulated.  Internal/external biliary drain placed.  Cytology on biliary drain bile showed malignant cells consistent with adenocarcinoma.   ?CT chest 11/13/2020-no evidence of metastatic disease.   ?CT pelvis 11/13/2020-nonspecific circumferential wall thickening of the cecum measuring up to 12 mm in thickness.  Otherwise no evidence of intrapelvic metastases.   ?11/19/2020-biliary stent placement, biliary drain exchange ?11/28/2020-exchange of internal/external biliary drain secondary to persistent jaundice ?CT abdomen/pelvis 11/28/2020-pancreas head mass, 3.5 x 2.8 cm, no encasement of the celiac  trunk or SMA, partial encasement of the portal venous confluence-at least 180 degrees, partial encasement of the SMV-approximately 180 degrees, ventral extension of  tumor abuts the posterior duodenum, no adenopathy, percutaneous biliary stent with decompression of previous dilated intrahepatic and common bile ducts ?Cycle 1 FOLFIRINOX 01/01/2021, irinotecan held ?Cycle 2 FOLFIRINOX 01/13/2021, irinotecan held ?Cycle 3 FOLFIRINOX 01/28/2021 ?Cycle 4 FOLFIRINOX 02/09/2021 ?Cycle 5 FOLFIRINOX 03/03/2021, Ziextenzo ?Cycle 6 FOLFIRINOX 03/25/2021, Ziextenzo ?CT abdomen/pelvis 04/06/2021-no change in pancreas head mass with partial encasement of the portal venous confluence, nonocclusive SMV thrombus, borderline upper abdominal lymph nodes-unchanged, no evidence of liver metastases ?Cycle 7 FOLFIRINOX 04/08/2021,Ziextenzo ?Cycle 8 FOLFIRINOX 04/22/2021, Ziextenzo ?05/01/2021-multidisciplinary consultation at [facility], changed to gemcitabine/Abraxane recommended ?Cycle 1 gemcitabine/Abraxane 05/06/2021 ?Cycle 2 gemcitabine/Abraxane 05/22/2021, gemcitabine dose reduced secondary to thrombocytopenia ?Cycle 3 gemcitabine/Abraxane 06/05/2021 ?Cycle 4 gemcitabine/Abraxane 06/19/2021 ?07/03/2021 CTs at [facility]-ill-defined hypoenhancing pancreatic head mass measures 2.3 cm.  Focal abutment and distortion/invasion of SMV with slightly increased nonocclusive thrombus within the portal vein and upper SMV.  Similar indeterminate retroperitoneal lymph nodes.  Mild central intrahepatic biliary dilatation with loss of previously seen pneumobilia. ?Dr. Trey Paula recommendation is for another month of chemotherapy and then restaging prior to surgery ?Cycle 5 gemcitabine 07/13/2021, Abraxane held due to neuropathy ?  ?Painless jaundice-internal/external biliary drain placed 11/12/2020.  Biliary stent placed and drain exchange 11/19/2020, exchange of internal/external drain 11/28/2020 ?Weight loss ?Diabetes diagnosed June 2022 ?Change in bowel habits February 2022 ?Colonoscopy 08/06/2020-8 mm polyp in the sigmoid colon, a few small and large mouth diverticula in the sigmoid colon, normal mucosa found in the entire colon with  biopsies for histology taken with a cold forceps from the right colon and left colon for evaluation of microscopic colitis (right colon-colonic mucosa with no significant pathologic findings; left colon-colonic mucosa with no significant pathologic findings; sigmoid polyp-tubular adenoma, negative for high-grade dysplasia). 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  ?  ? ?Disposition: Francisco Frank appears unchanged.  He has completed 4 cycles of gemcitabine/Abraxane.  The plan is for an additional month of chemotherapy, restaging studies at [facility] and then surgery. ? ?Neuropathy symptoms are worse.  He understands this could be due to Abraxane.  We are holding Abraxane with today's treatment.  He will receive gemcitabine alone.  We instructed him that he should not be driving due to the neuropathy symptoms. ? ?CBC and chemistry panel reviewed.  Labs adequate to proceed with Gemcitabine. ? ?He will return for lab, follow-up, Gemcitabine, +/- Abraxane depending on neuropathy symptoms.  He will contact the office in the interim with any problems. ? ?Patient seen with Dr. Benay Spice ? ? ? ?Ned Card ANP/GNP-BC  ? ?07/13/2021  ?11:22 AM ?This was a shared visit with Ned Card.  The restaging CT at [facility] revealed no evidence of disease progression.  He has persistent venous involvement with tumor and thrombus.  I communicated with Dr. Hyman Hopes.  He recommends continuing chemotherapy for an additional month.  He will then undergo surgery if there is no evidence of disease progression. ?Francisco Frank has developed progressive neuropathy symptoms.  It is difficult to know whether the neuropathy is related to oxaliplatin or Abraxane.  Abraxane will be placed on hold. ? ?I was present for greater than 50% of today's visit.  I performed medical decision making. ? ?Julieanne Manson, MD ? ? ? ? ? ? ?

## 2021-07-13 NOTE — Patient Instructions (Signed)
[facility]  Discharge Instructions: ?Thank you for choosing [facility] to provide your oncology and hematology care.  ? ?If you have a lab appointment with the [facility], please go directly to the [facility] and check in at the registration area. ?  ?Wear comfortable clothing and clothing appropriate for easy access to any Portacath or PICC line.  ? ?We strive to give you quality time with your provider. You may need to reschedule your appointment if you arrive late (15 or more minutes).  Arriving late affects you and other patients whose appointments are after yours.  Also, if you miss three or more appointments without notifying the office, you may be dismissed from the clinic at the provider?s discretion.    ?  ?For prescription refill requests, have your pharmacy contact our office and allow 72 hours for refills to be completed.   ? ?Today you received the following chemotherapy and/or immunotherapy agents: gemcitabine    ?  ?To help prevent nausea and vomiting after your treatment, we encourage you to take your nausea medication as directed. ? ?BELOW ARE SYMPTOMS THAT SHOULD BE REPORTED IMMEDIATELY: ?*FEVER GREATER THAN 100.4 F (38 ?C) OR HIGHER ?*CHILLS OR SWEATING ?*NAUSEA AND VOMITING THAT IS NOT CONTROLLED WITH YOUR NAUSEA MEDICATION ?*UNUSUAL SHORTNESS OF BREATH ?*UNUSUAL BRUISING OR BLEEDING ?*URINARY PROBLEMS (pain or burning when urinating, or frequent urination) ?*BOWEL PROBLEMS (unusual diarrhea, constipation, pain near the anus) ?TENDERNESS IN MOUTH AND THROAT WITH OR WITHOUT PRESENCE OF ULCERS (sore throat, sores in mouth, or a toothache) ?UNUSUAL RASH, SWELLING OR PAIN  ?UNUSUAL VAGINAL DISCHARGE OR ITCHING  ? ?Items with * indicate a potential emergency and should be followed up as soon as possible or go to the Emergency Department if any problems should occur. ? ?Please show the CHEMOTHERAPY ALERT CARD or IMMUNOTHERAPY ALERT CARD at check-in to  the Emergency Department and triage nurse. ? ?Should you have questions after your visit or need to cancel or reschedule your appointment, please contact [facility]  Dept: [phone]  and follow the prompts.  Office hours are 8:00 a.m. to 4:30 p.m. Monday - Friday. Please note that voicemails left after 4:00 p.m. may not be returned until the following business day.  We are closed weekends and major holidays. You have access to a nurse at all times for urgent questions. Please call the main number to the clinic Dept: [phone] and follow the prompts. ? ? ?For any non-urgent questions, you may also contact your provider using MyChart. We now offer e-Visits for anyone 51 and older to request care online for non-urgent symptoms. For details visit [email or web address]. ?  ?Also download the MyChart app! Go to the app store, search "MyChart", open the app, select [facility], and log in with your MyChart username and password. ? ?Due to Covid, a mask is required upon entering the hospital/clinic. If you do not have a mask, one will be given to you upon arrival. For doctor visits, patients may have 1 support person aged 79 or older with them. For treatment visits, patients cannot have anyone with them due to current Covid guidelines and our immunocompromised population.  ? ?Gemcitabine injection ?What is this medication? ?GEMCITABINE (jem SYE ta been) is a chemotherapy drug. This medicine is used to treat many types of cancer like breast cancer, lung cancer, pancreatic cancer, and ovarian cancer. ?This medicine may be used for other purposes; ask your health care provider or  pharmacist if you have questions. ?COMMON BRAND NAME(S): Gemzar, Infugem ?What should I tell my care team before I take this medication? ?They need to know if you have any of these conditions: ?blood disorders ?infection ?kidney disease ?liver disease ?lung or breathing disease, like asthma ?recent or ongoing  radiation therapy ?an unusual or allergic reaction to gemcitabine, other chemotherapy, other medicines, foods, dyes, or preservatives ?pregnant or trying to get pregnant ?breast-feeding ?How should I use this medication? ?This drug is given as an infusion into a vein. It is administered in a hospital or clinic by a specially trained health care professional. ?Talk to your pediatrician regarding the use of this medicine in children. Special care may be needed. ?Overdosage: If you think you have taken too much of this medicine contact a poison control center or emergency room at once. ?NOTE: This medicine is only for you. Do not share this medicine with others. ?What if I miss a dose? ?It is important not to miss your dose. Call your doctor or health care professional if you are unable to keep an appointment. ?What may interact with this medication? ?medicines to increase blood counts like filgrastim, pegfilgrastim, sargramostim ?some other chemotherapy drugs like cisplatin ?vaccines ?Talk to your doctor or health care professional before taking any of these medicines: ?acetaminophen ?aspirin ?ibuprofen ?ketoprofen ?naproxen ?This list may not describe all possible interactions. Give your health care provider a list of all the medicines, herbs, non-prescription drugs, or dietary supplements you use. Also tell them if you smoke, drink alcohol, or use illegal drugs. Some items may interact with your medicine. ?What should I watch for while using this medication? ?Visit your doctor for checks on your progress. This drug may make you feel generally unwell. This is not uncommon, as chemotherapy can affect healthy cells as well as cancer cells. Report any side effects. Continue your course of treatment even though you feel ill unless your doctor tells you to stop. ?In some cases, you may be given additional medicines to help with side effects. Follow all directions for their use. ?Call your doctor or health care  professional for advice if you get a fever, chills or sore throat, or other symptoms of a cold or flu. Do not treat yourself. This drug decreases your body's ability to fight infections. Try to avoid being around people who are sick. ?This medicine may increase your risk to bruise or bleed. Call your doctor or health care professional if you notice any unusual bleeding. ?Be careful brushing and flossing your teeth or using a toothpick because you may get an infection or bleed more easily. If you have any dental work done, tell your dentist you are receiving this medicine. ?Avoid taking products that contain aspirin, acetaminophen, ibuprofen, naproxen, or ketoprofen unless instructed by your doctor. These medicines may hide a fever. ?Do not become pregnant while taking this medicine or for 6 months after stopping it. Women should inform their doctor if they wish to become pregnant or think they might be pregnant. Men should not father a child while taking this medicine and for 3 months after stopping it. There is a potential for serious side effects to an unborn child. Talk to your health care professional or pharmacist for more information. Do not breast-feed an infant while taking this medicine or for at least 1 week after stopping it. ?Men should inform their doctors if they wish to father a child. This medicine may lower sperm counts. Talk with your doctor or health care  professional if you are concerned about your fertility. ?What side effects may I notice from receiving this medication? ?Side effects that you should report to your doctor or health care professional as soon as possible: ?allergic reactions like skin rash, itching or hives, swelling of the face, lips, or tongue ?breathing problems ?pain, redness, or irritation at site where injected ?signs and symptoms of a dangerous change in heartbeat or heart rhythm like chest pain; dizziness; fast or irregular heartbeat; palpitations; feeling faint or  lightheaded, falls; breathing problems ?signs of decreased platelets or bleeding - bruising, pinpoint red spots on the skin, black, tarry stools, blood in the urine ?signs of decreased red blood cells - unusually weak or tire

## 2021-07-14 LAB — CANCER ANTIGEN 19-9: CA 19-9: 303 U/mL — ABNORMAL HIGH (ref 0–35)

## 2021-07-17 ENCOUNTER — Ambulatory Visit: Payer: Medicare Other | Admitting: Oncology

## 2021-07-17 ENCOUNTER — Other Ambulatory Visit: Payer: Medicare Other

## 2021-07-17 ENCOUNTER — Ambulatory Visit: Payer: Medicare Other

## 2021-07-20 ENCOUNTER — Telehealth: Payer: Self-pay | Admitting: Oncology

## 2021-07-20 NOTE — Telephone Encounter (Signed)
Patient left a voicemail to nurse navigator in regards to rescheduling appts on 5/23 due to having appts at [facility] on that day. No answer so voicemail was left for patient to call back  ?

## 2021-07-24 ENCOUNTER — Other Ambulatory Visit: Payer: Self-pay | Admitting: Oncology

## 2021-07-25 ENCOUNTER — Other Ambulatory Visit: Payer: Self-pay | Admitting: Oncology

## 2021-07-28 ENCOUNTER — Encounter: Payer: Self-pay | Admitting: Oncology

## 2021-07-28 ENCOUNTER — Inpatient Hospital Stay: Payer: Medicare Other

## 2021-07-28 ENCOUNTER — Encounter: Payer: Self-pay | Admitting: *Deleted

## 2021-07-28 ENCOUNTER — Inpatient Hospital Stay: Payer: Medicare Other | Attending: Nurse Practitioner

## 2021-07-28 ENCOUNTER — Inpatient Hospital Stay (HOSPITAL_BASED_OUTPATIENT_CLINIC_OR_DEPARTMENT_OTHER): Payer: Medicare Other | Admitting: Oncology

## 2021-07-28 VITALS — BP 126/82 | HR 66 | Temp 98.2°F | Resp 20 | Ht 68.0 in | Wt 157.8 lb

## 2021-07-28 DIAGNOSIS — Z5111 Encounter for antineoplastic chemotherapy: Secondary | ICD-10-CM | POA: Insufficient documentation

## 2021-07-28 DIAGNOSIS — C250 Malignant neoplasm of head of pancreas: Secondary | ICD-10-CM

## 2021-07-28 DIAGNOSIS — C7989 Secondary malignant neoplasm of other specified sites: Secondary | ICD-10-CM | POA: Diagnosis not present

## 2021-07-28 LAB — CMP (CANCER CENTER ONLY)
ALT: 26 U/L (ref 0–44)
AST: 33 U/L (ref 15–41)
Albumin: 3.7 g/dL (ref 3.5–5.0)
Alkaline Phosphatase: 127 U/L — ABNORMAL HIGH (ref 38–126)
Anion gap: 8 (ref 5–15)
BUN: 15 mg/dL (ref 8–23)
CO2: 25 mmol/L (ref 22–32)
Calcium: 8.7 mg/dL — ABNORMAL LOW (ref 8.9–10.3)
Chloride: 106 mmol/L (ref 98–111)
Creatinine: 1.02 mg/dL (ref 0.61–1.24)
GFR, Estimated: >60 mL/min (ref >60)
Glucose, Bld: 176 mg/dL — ABNORMAL HIGH (ref 70–99)
Potassium: 3.7 mmol/L (ref 3.5–5.1)
Sodium: 139 mmol/L (ref 135–145)
Total Bilirubin: 0.3 mg/dL (ref 0.3–1.2)
Total Protein: 6.2 g/dL — ABNORMAL LOW (ref 6.5–8.1)

## 2021-07-28 LAB — CBC WITH DIFFERENTIAL (CANCER CENTER ONLY)
Abs Immature Granulocytes: 0.03 10*3/uL (ref 0.00–0.07)
Basophils Absolute: 0.1 10*3/uL (ref 0.0–0.1)
Basophils Relative: 1 %
Eosinophils Absolute: 0.4 10*3/uL (ref 0.0–0.5)
Eosinophils Relative: 6 %
HCT: 30.3 % — ABNORMAL LOW (ref 39.0–52.0)
Hemoglobin: 10.1 g/dL — ABNORMAL LOW (ref 13.0–17.0)
Immature Granulocytes: 0 %
Lymphocytes Relative: 27 %
Lymphs Abs: 1.9 10*3/uL (ref 0.7–4.0)
MCH: 31.1 pg (ref 26.0–34.0)
MCHC: 33.3 g/dL (ref 30.0–36.0)
MCV: 93.2 fL (ref 80.0–100.0)
Monocytes Absolute: 0.8 10*3/uL (ref 0.1–1.0)
Monocytes Relative: 12 %
Neutro Abs: 3.8 10*3/uL (ref 1.7–7.7)
Neutrophils Relative %: 54 %
Platelet Count: 210 10*3/uL (ref 150–400)
RBC: 3.25 MIL/uL — ABNORMAL LOW (ref 4.22–5.81)
RDW: 14.4 % (ref 11.5–15.5)
WBC Count: 7.1 10*3/uL (ref 4.0–10.5)
nRBC: 0 % (ref 0.0–0.2)

## 2021-07-28 MED ORDER — SODIUM CHLORIDE 0.9 % IV SOLN
800.0000 mg/m2 | Freq: Once | INTRAVENOUS | Status: AC
  Administered 2021-07-28: 1444 mg via INTRAVENOUS
  Filled 2021-07-28: qty 26.3

## 2021-07-28 MED ORDER — PROCHLORPERAZINE MALEATE 10 MG PO TABS
10.0000 mg | ORAL_TABLET | Freq: Once | ORAL | Status: AC
  Administered 2021-07-28: 10 mg via ORAL
  Filled 2021-07-28: qty 1

## 2021-07-28 MED ORDER — HEPARIN SOD (PORK) LOCK FLUSH 100 UNIT/ML IV SOLN
500.0000 [IU] | Freq: Once | INTRAVENOUS | Status: AC | PRN
  Administered 2021-07-28: 500 [IU]

## 2021-07-28 MED ORDER — SODIUM CHLORIDE 0.9 % IV SOLN: Freq: Once | INTRAVENOUS | Status: AC

## 2021-07-28 MED ORDER — SODIUM CHLORIDE 0.9% FLUSH
10.0000 mL | INTRAVENOUS | Status: DC | PRN
  Administered 2021-07-28: 10 mL

## 2021-07-28 NOTE — Progress Notes (Signed)
?[facility] ?OFFICE PROGRESS NOTE ? ? ?Diagnosis: Pancreas cancer ? ?INTERVAL HISTORY:  ? ?[name] completed a cycle of gemcitabine on 07/13/2021.  He continues to have numbness and tingling in the hands and feet.  He is dropping objects.  No other complaint. ? ?Objective: ? ?Vital signs in last 24 hours: ? ?Blood pressure 126/82, pulse 66, temperature 98.2 ?F (36.8 ?C), temperature source Oral, resp. rate 20, height '5\' 8"'$  (1.727 m), weight 157 lb 12.8 oz (71.6 kg), SpO2 100 %. ?  ? ?HEENT: No thrush or ulcers ?Resp: Lungs clear bilaterally ?Cardio: Regular rate and rhythm ?GI: No hepatosplenomegaly, nontender ?Vascular: No leg edema  ?Skin: No rash ? ?Portacath/PICC-without erythema ? ?Lab Results: ? ?Lab Results  ?Component Value Date  ? WBC 7.1 07/28/2021  ? HGB 10.1 (L) 07/28/2021  ? HCT 30.3 (L) 07/28/2021  ? MCV 93.2 07/28/2021  ? PLT 210 07/28/2021  ? NEUTROABS 3.8 07/28/2021  ? ? ?CMP  ?Lab Results  ?Component Value Date  ? NA 139 07/28/2021  ? K 3.7 07/28/2021  ? CL 106 07/28/2021  ? CO2 25 07/28/2021  ? GLUCOSE 176 (H) 07/28/2021  ? BUN 15 07/28/2021  ? CREATININE 1.02 07/28/2021  ? CALCIUM 8.7 (L) 07/28/2021  ? PROT 6.2 (L) 07/28/2021  ? ALBUMIN 3.7 07/28/2021  ? AST 33 07/28/2021  ? ALT 26 07/28/2021  ? ALKPHOS 127 (H) 07/28/2021  ? BILITOT 0.3 07/28/2021  ? GFRNONAA >60 07/28/2021  ? ? ?Lab Results  ?Component Value Date  ? CWC376 303 (H) 07/13/2021  ? ? ? ?Medications: I have reviewed the patient's current medications. ? ? ?Assessment/Plan: ?Pancreas cancer ?Outside CT-apparent pancreas abnormality (we do not have a copy of the report) ?MRI abdomen 11/06/2020-pancreatic body and tail atrophy with duct dilatation up to 9 mm.  Both ducts undergo an abrupt transition in the region of the pancreatic head.  Non border deforming pancreatic head mass measuring 3.2 x 2.6 cm.  No arterial involvement by tumor.  SMV adjacent to presumed tumor without encasement.  Portal vein uninvolved.  No  abdominal adenopathy.  Left periaortic 7 mm node not pathologic by size criteria.  No ascites.  Subtle left-sided pericolonic nodule 5 mm. ?EUS/ERCP 11/11/2020-EGD showed gastritis which was biopsied, mucosal changes in the duodenum, mucosal changes in the duodenum sweep (gastric antral and oxyntic mucosa with no specific histopathologic changes; Warthin Starry stain negative for H pylori).  EUS showed an irregular mass in the pancreatic head measuring 26 mm x 30 mm; the outer margins were irregular.  There was sonographic evidence suggesting invasion into the portal vein (manifested by abutment) and the superior mesenteric vein (manifested by abutment); an intact interface was seen between the mass and the superior mesenteric artery and celiac trunk suggesting a lack of invasion.  Endosonographic imaging in the visualized portion of the liver showed no mass.  There was dilatation of the common bile duct and the common hepatic duct.  No malignant appearing lymph nodes were visualized in the celiac region, peripancreatic region, porta hepatis region.  FNA biopsy of the pancreas head mass showed malignant cells consistent with adenocarcinoma.  Biliary access could not be obtained.  ?Cholangiogram 11/12/2020-severe intrahepatic biliary dilatation.  A peripheral right hepatic bile duct was successfully cannulated.  Internal/external biliary drain placed.  Cytology on biliary drain bile showed malignant cells consistent with adenocarcinoma.   ?CT chest 11/13/2020-no evidence of metastatic disease.   ?CT pelvis 11/13/2020-nonspecific circumferential wall thickening of the cecum measuring  up to 12 mm in thickness.  Otherwise no evidence of intrapelvic metastases.   ?11/19/2020-biliary stent placement, biliary drain exchange ?11/28/2020-exchange of internal/external biliary drain secondary to persistent jaundice ?CT abdomen/pelvis 11/28/2020-pancreas head mass, 3.5 x 2.8 cm, no encasement of the celiac trunk or SMA, partial  encasement of the portal venous confluence-at least 180 degrees, partial encasement of the SMV-approximately 180 degrees, ventral extension of tumor abuts the posterior duodenum, no adenopathy, percutaneous biliary stent with decompression of previous dilated intrahepatic and common bile ducts ?Cycle 1 FOLFIRINOX 01/01/2021, irinotecan held ?Cycle 2 FOLFIRINOX 01/13/2021, irinotecan held ?Cycle 3 FOLFIRINOX 01/28/2021 ?Cycle 4 FOLFIRINOX 02/09/2021 ?Cycle 5 FOLFIRINOX 03/03/2021, Ziextenzo ?Cycle 6 FOLFIRINOX 03/25/2021, Ziextenzo ?CT abdomen/pelvis 04/06/2021-no change in pancreas head mass with partial encasement of the portal venous confluence, nonocclusive SMV thrombus, borderline upper abdominal lymph nodes-unchanged, no evidence of liver metastases ?Cycle 7 FOLFIRINOX 04/08/2021,Ziextenzo ?Cycle 8 FOLFIRINOX 04/22/2021, Ziextenzo ?05/01/2021-multidisciplinary consultation at [facility], changed to gemcitabine/Abraxane recommended ?Cycle 1 gemcitabine/Abraxane 05/06/2021 ?Cycle 2 gemcitabine/Abraxane 05/22/2021, gemcitabine dose reduced secondary to thrombocytopenia ?Cycle 3 gemcitabine/Abraxane 06/05/2021 ?Cycle 4 gemcitabine/Abraxane 06/19/2021 ?07/03/2021 CTs at [facility]-ill-defined hypoenhancing pancreatic head mass measures 2.3 cm.  Focal abutment and distortion/invasion of SMV with slightly increased nonocclusive thrombus within the portal vein and upper SMV.  Similar indeterminate retroperitoneal lymph nodes.  Mild central intrahepatic biliary dilatation with loss of previously seen pneumobilia. ?Dr. Trey Paula recommendation is for another month of chemotherapy and then restaging prior to surgery ?Cycle 5 gemcitabine 07/13/2021, Abraxane held due to neuropathy ?Cycle 6 gemcitabine 07/28/2021, Abraxane held due to neuropathy ?  ?Painless jaundice-internal/external biliary drain placed 11/12/2020.  Biliary stent placed and drain exchange 11/19/2020, exchange of internal/external drain 11/28/2020 ?Weight loss ?Diabetes diagnosed June  2022 ?Change in bowel habits February 2022 ?Colonoscopy 08/06/2020-8 mm polyp in the sigmoid colon, a few small and large mouth diverticula in the sigmoid colon, normal mucosa found in the entire colon with biopsies for histology taken with a cold forceps from the right colon and left colon for evaluation of microscopic colitis (right colon-colonic mucosa with no significant pathologic findings; left colon-colonic mucosa with no significant pathologic findings; sigmoid polyp-tubular adenoma, negative for high-grade dysplasia). 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  ? ? ?Disposition: ?Francisco Frank appears stable.  He has persistent neuropathy symptoms.  Abraxane will remain on hold.  He will complete another cycle of gemcitabine today.  He will return for an office visit and chemotherapy in 2 weeks. ? ?Betsy Coder, MD ? ?07/28/2021  ?9:52 AM ? ? ?

## 2021-07-28 NOTE — Progress Notes (Signed)
Patient seen by Dr. Benay Spice today ? ?Vitals are within treatment parameters. ? ?Labs reviewed by Dr. Benay Spice and are within treatment parameters. ? ?Per physician team, patient is ready for treatment. Please note that modifications are being made to the treatment plan including Continue to hold Abraxane.   ?

## 2021-07-28 NOTE — Patient Instructions (Signed)
[facility]   ?Discharge Instructions: ?Thank you for choosing [facility] to provide your oncology and hematology care.  ? ?If you have a lab appointment with the [facility], please go directly to the [facility] and check in at the registration area. ?  ?Wear comfortable clothing and clothing appropriate for easy access to any Portacath or PICC line.  ? ?We strive to give you quality time with your provider. You may need to reschedule your appointment if you arrive late (15 or more minutes).  Arriving late affects you and other patients whose appointments are after yours.  Also, if you miss three or more appointments without notifying the office, you may be dismissed from the clinic at the provider?s discretion.    ?  ?For prescription refill requests, have your pharmacy contact our office and allow 72 hours for refills to be completed.   ? ?Today you received the following chemotherapy and/or immunotherapy agents Gemcitabine (GEMZAR).    ?  ?To help prevent nausea and vomiting after your treatment, we encourage you to take your nausea medication as directed. ? ?BELOW ARE SYMPTOMS THAT SHOULD BE REPORTED IMMEDIATELY: ?*FEVER GREATER THAN 100.4 F (38 ?C) OR HIGHER ?*CHILLS OR SWEATING ?*NAUSEA AND VOMITING THAT IS NOT CONTROLLED WITH YOUR NAUSEA MEDICATION ?*UNUSUAL SHORTNESS OF BREATH ?*UNUSUAL BRUISING OR BLEEDING ?*URINARY PROBLEMS (pain or burning when urinating, or frequent urination) ?*BOWEL PROBLEMS (unusual diarrhea, constipation, pain near the anus) ?TENDERNESS IN MOUTH AND THROAT WITH OR WITHOUT PRESENCE OF ULCERS (sore throat, sores in mouth, or a toothache) ?UNUSUAL RASH, SWELLING OR PAIN  ?UNUSUAL VAGINAL DISCHARGE OR ITCHING  ? ?Items with * indicate a potential emergency and should be followed up as soon as possible or go to the Emergency Department if any problems should occur. ? ?Please show the CHEMOTHERAPY ALERT CARD or IMMUNOTHERAPY ALERT CARD at  check-in to the Emergency Department and triage nurse. ? ?Should you have questions after your visit or need to cancel or reschedule your appointment, please contact [facility]  Dept: [phone]  and follow the prompts.  Office hours are 8:00 a.m. to 4:30 p.m. Monday - Friday. Please note that voicemails left after 4:00 p.m. may not be returned until the following business day.  We are closed weekends and major holidays. You have access to a nurse at all times for urgent questions. Please call the main number to the clinic Dept: [phone] and follow the prompts. ? ? ?For any non-urgent questions, you may also contact your provider using MyChart. We now offer e-Visits for anyone 46 and older to request care online for non-urgent symptoms. For details visit [email or web address]. ?  ?Also download the MyChart app! Go to the app store, search "MyChart", open the app, select [facility], and log in with your MyChart username and password. ? ?Due to Covid, a mask is required upon entering the hospital/clinic. If you do not have a mask, one will be given to you upon arrival. For doctor visits, patients may have 1 support person aged 67 or older with them. For treatment visits, patients cannot have anyone with them due to current Covid guidelines and our immunocompromised population.  ? ?Gemcitabine injection ?What is this medication? ?GEMCITABINE (jem SYE ta been) is a chemotherapy drug. This medicine is used to treat many types of cancer like breast cancer, lung cancer, pancreatic cancer, and ovarian cancer. ?This medicine may be used for other purposes; ask your health care  provider or pharmacist if you have questions. ?COMMON BRAND NAME(S): Gemzar, Infugem ?What should I tell my care team before I take this medication? ?They need to know if you have any of these conditions: ?blood disorders ?infection ?kidney disease ?liver disease ?lung or breathing disease, like asthma ?recent or  ongoing radiation therapy ?an unusual or allergic reaction to gemcitabine, other chemotherapy, other medicines, foods, dyes, or preservatives ?pregnant or trying to get pregnant ?breast-feeding ?How should I use this medication? ?This drug is given as an infusion into a vein. It is administered in a hospital or clinic by a specially trained health care professional. ?Talk to your pediatrician regarding the use of this medicine in children. Special care may be needed. ?Overdosage: If you think you have taken too much of this medicine contact a poison control center or emergency room at once. ?NOTE: This medicine is only for you. Do not share this medicine with others. ?What if I miss a dose? ?It is important not to miss your dose. Call your doctor or health care professional if you are unable to keep an appointment. ?What may interact with this medication? ?medicines to increase blood counts like filgrastim, pegfilgrastim, sargramostim ?some other chemotherapy drugs like cisplatin ?vaccines ?Talk to your doctor or health care professional before taking any of these medicines: ?acetaminophen ?aspirin ?ibuprofen ?ketoprofen ?naproxen ?This list may not describe all possible interactions. Give your health care provider a list of all the medicines, herbs, non-prescription drugs, or dietary supplements you use. Also tell them if you smoke, drink alcohol, or use illegal drugs. Some items may interact with your medicine. ?What should I watch for while using this medication? ?Visit your doctor for checks on your progress. This drug may make you feel generally unwell. This is not uncommon, as chemotherapy can affect healthy cells as well as cancer cells. Report any side effects. Continue your course of treatment even though you feel ill unless your doctor tells you to stop. ?In some cases, you may be given additional medicines to help with side effects. Follow all directions for their use. ?Call your doctor or health care  professional for advice if you get a fever, chills or sore throat, or other symptoms of a cold or flu. Do not treat yourself. This drug decreases your body's ability to fight infections. Try to avoid being around people who are sick. ?This medicine may increase your risk to bruise or bleed. Call your doctor or health care professional if you notice any unusual bleeding. ?Be careful brushing and flossing your teeth or using a toothpick because you may get an infection or bleed more easily. If you have any dental work done, tell your dentist you are receiving this medicine. ?Avoid taking products that contain aspirin, acetaminophen, ibuprofen, naproxen, or ketoprofen unless instructed by your doctor. These medicines may hide a fever. ?Do not become pregnant while taking this medicine or for 6 months after stopping it. Women should inform their doctor if they wish to become pregnant or think they might be pregnant. Men should not father a child while taking this medicine and for 3 months after stopping it. There is a potential for serious side effects to an unborn child. Talk to your health care professional or pharmacist for more information. Do not breast-feed an infant while taking this medicine or for at least 1 week after stopping it. ?Men should inform their doctors if they wish to father a child. This medicine may lower sperm counts. Talk with your doctor or  health care professional if you are concerned about your fertility. ?What side effects may I notice from receiving this medication? ?Side effects that you should report to your doctor or health care professional as soon as possible: ?allergic reactions like skin rash, itching or hives, swelling of the face, lips, or tongue ?breathing problems ?pain, redness, or irritation at site where injected ?signs and symptoms of a dangerous change in heartbeat or heart rhythm like chest pain; dizziness; fast or irregular heartbeat; palpitations; feeling faint or  lightheaded, falls; breathing problems ?signs of decreased platelets or bleeding - bruising, pinpoint red spots on the skin, black, tarry stools, blood in the urine ?signs of decreased red blood cells - unusually w

## 2021-07-28 NOTE — Progress Notes (Deleted)
?Francisco Frank ?OFFICE PROGRESS NOTE ? ? ?Diagnosis:  ? ?INTERVAL HISTORY:  ? ?*** ? ?Objective: ? ?Vital signs in last 24 hours: ? ?Blood pressure 126/82, pulse 66, temperature 98.2 ?F (36.8 ?C), temperature source Oral, resp. rate 20, height '5\' 8"'$  (1.727 m), weight 157 lb 12.8 oz (71.6 kg), SpO2 100 %. ?  ? ?HEENT: *** ?Lymphatics: *** ?Resp: *** ?Cardio: *** ?GI: *** ?Vascular: *** ?Neuro:***  ?Skin:***  ? ?Portacath/PICC-without erythema ? ?Lab Results: ? ?Lab Results  ?Component Value Date  ? WBC 7.1 07/28/2021  ? HGB 10.1 (L) 07/28/2021  ? HCT 30.3 (L) 07/28/2021  ? MCV 93.2 07/28/2021  ? PLT 210 07/28/2021  ? NEUTROABS 3.8 07/28/2021  ? ? ?CMP  ?Lab Results  ?Component Value Date  ? NA 139 07/28/2021  ? K 3.7 07/28/2021  ? CL 106 07/28/2021  ? CO2 25 07/28/2021  ? GLUCOSE 176 (H) 07/28/2021  ? BUN 15 07/28/2021  ? CREATININE 1.02 07/28/2021  ? CALCIUM 8.7 (L) 07/28/2021  ? PROT 6.2 (L) 07/28/2021  ? ALBUMIN 3.7 07/28/2021  ? AST 33 07/28/2021  ? ALT 26 07/28/2021  ? ALKPHOS 127 (H) 07/28/2021  ? BILITOT 0.3 07/28/2021  ? GFRNONAA >60 07/28/2021  ? ? ?Lab Results  ?Component Value Date  ? WUX324 303 (H) 07/13/2021  ? ? ?Lab Results  ?Component Value Date  ? INR 1.1 12/13/2020  ? LABPROT 14.0 12/13/2020  ? ? ?Imaging: ? ?No results found. ? ?Medications: I have reviewed the patient's current medications. ? ? ?Assessment/Plan: ?Pancreas cancer ?Outside CT-apparent pancreas abnormality (we do not have a copy of the report) ?MRI abdomen 11/06/2020-pancreatic body and tail atrophy with duct dilatation up to 9 mm.  Both ducts undergo an abrupt transition in the region of the pancreatic head.  Non border deforming pancreatic head mass measuring 3.2 x 2.6 cm.  No arterial involvement by tumor.  SMV adjacent to presumed tumor without encasement.  Portal vein uninvolved.  No abdominal adenopathy.  Left periaortic 7 mm node not pathologic by size criteria.  No ascites.  Subtle left-sided pericolonic nodule 5  mm. ?EUS/ERCP 11/11/2020-EGD showed gastritis which was biopsied, mucosal changes in the duodenum, mucosal changes in the duodenum sweep (gastric antral and oxyntic mucosa with no specific histopathologic changes; Warthin Starry stain negative for H pylori).  EUS showed an irregular mass in the pancreatic head measuring 26 mm x 30 mm; the outer margins were irregular.  There was sonographic evidence suggesting invasion into the portal vein (manifested by abutment) and the superior mesenteric vein (manifested by abutment); an intact interface was seen between the mass and the superior mesenteric artery and celiac trunk suggesting a lack of invasion.  Endosonographic imaging in the visualized portion of the liver showed no mass.  There was dilatation of the common bile duct and the common hepatic duct.  No malignant appearing lymph nodes were visualized in the celiac region, peripancreatic region, porta hepatis region.  FNA biopsy of the pancreas head mass showed malignant cells consistent with adenocarcinoma.  Biliary access could not be obtained.  ?Cholangiogram 11/12/2020-severe intrahepatic biliary dilatation.  A peripheral right hepatic bile duct was successfully cannulated.  Internal/external biliary drain placed.  Cytology on biliary drain bile showed malignant cells consistent with adenocarcinoma.   ?CT chest 11/13/2020-no evidence of metastatic disease.   ?CT pelvis 11/13/2020-nonspecific circumferential wall thickening of the cecum measuring up to 12 mm in thickness.  Otherwise no evidence of intrapelvic metastases.   ?11/19/2020-biliary stent  placement, biliary drain exchange ?11/28/2020-exchange of internal/external biliary drain secondary to persistent jaundice ?CT abdomen/pelvis 11/28/2020-pancreas head mass, 3.5 x 2.8 cm, no encasement of the celiac trunk or SMA, partial encasement of the portal venous confluence-at least 180 degrees, partial encasement of the SMV-approximately 180 degrees, ventral extension of  tumor abuts the posterior duodenum, no adenopathy, percutaneous biliary stent with decompression of previous dilated intrahepatic and common bile ducts ?Cycle 1 FOLFIRINOX 01/01/2021, irinotecan held ?Cycle 2 FOLFIRINOX 01/13/2021, irinotecan held ?Cycle 3 FOLFIRINOX 01/28/2021 ?Cycle 4 FOLFIRINOX 02/09/2021 ?Cycle 5 FOLFIRINOX 03/03/2021, Ziextenzo ?Cycle 6 FOLFIRINOX 03/25/2021, Ziextenzo ?CT abdomen/pelvis 04/06/2021-no change in pancreas head mass with partial encasement of the portal venous confluence, nonocclusive SMV thrombus, borderline upper abdominal lymph nodes-unchanged, no evidence of liver metastases ?Cycle 7 FOLFIRINOX 04/08/2021,Ziextenzo ?Cycle 8 FOLFIRINOX 04/22/2021, Ziextenzo ?05/01/2021-[facility]idisciplinary [facility] at [facility], changed to gemcitabine/Abraxane recommended ?Cycle 1 gemcitabine/Abraxane 05/06/2021 ?Cycle 2 gemcitabine/Abraxane 05/22/2021, gemcitabine dose reduced secondary to thrombocytopenia ?Cycle 3 gemcitabine/Abraxane 06/05/2021 ?Cycle 4 gemcitabine/Abraxane 06/19/2021 ?07/03/2021 CTs at [facility]-ill-defined hypoenhancing pancreatic head mass measures 2.3 cm.  Focal abutment and distortion/invasion of SMV with slightly increased nonocclusive thrombus within the portal vein and upper SMV.  Similar indeterminate retroperitoneal lymph nodes.  Mild central intrahepatic biliary dilatation with loss of previously seen pneumobilia. ?Dr. Trey Paula recommendation is for another month of chemotherapy and then restaging prior to surgery ?Cycle 5 gemcitabine 07/13/2021, Abraxane held due to neuropathy ?  ?Painless jaundice-internal/external biliary drain placed 11/12/2020.  Biliary stent placed and drain exchange 11/19/2020, exchange of internal/external drain 11/28/2020 ?Weight loss ?Diabetes diagnosed June 2022 ?Change in bowel habits February 2022 ?Colonoscopy 08/06/2020-8 mm polyp in the sigmoid colon, a few small and large mouth diverticula in the sigmoid colon, normal mucosa found in the entire colon with  biopsies for histology taken with a cold forceps from the right colon and left colon for evaluation of microscopic colitis (right colon-colonic mucosa with no significant pathologic findings; left colon-colonic mucosa with no significant pathologic findings; sigmoid polyp-tubular adenoma, negative for high-grade dysplasia). 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  ?  ? ? ?Disposition: ?*** ? ?Betsy Coder, MD ? ?07/28/2021  ?9:37 AM ? ? ?

## 2021-07-29 LAB — CANCER ANTIGEN 19-9: CA 19-9: 592 U/mL — ABNORMAL HIGH (ref 0–35)

## 2021-08-08 ENCOUNTER — Other Ambulatory Visit: Payer: Self-pay | Admitting: Oncology

## 2021-08-11 ENCOUNTER — Other Ambulatory Visit: Payer: Medicare Other

## 2021-08-11 ENCOUNTER — Ambulatory Visit: Payer: Medicare Other | Admitting: Oncology

## 2021-08-11 ENCOUNTER — Ambulatory Visit: Payer: Medicare Other

## 2021-08-11 ENCOUNTER — Inpatient Hospital Stay: Payer: Medicare Other

## 2021-08-13 ENCOUNTER — Inpatient Hospital Stay: Payer: Medicare Other

## 2021-08-13 ENCOUNTER — Inpatient Hospital Stay: Payer: Medicare Other | Admitting: Oncology

## 2021-08-13 ENCOUNTER — Encounter: Payer: Self-pay | Admitting: *Deleted

## 2021-08-13 VITALS — BP 116/73 | HR 64 | Temp 98.2°F | Resp 18 | Ht 68.0 in | Wt 158.0 lb

## 2021-08-13 DIAGNOSIS — Z5111 Encounter for antineoplastic chemotherapy: Secondary | ICD-10-CM | POA: Diagnosis not present

## 2021-08-13 DIAGNOSIS — C250 Malignant neoplasm of head of pancreas: Secondary | ICD-10-CM

## 2021-08-13 LAB — CMP (CANCER CENTER ONLY)
ALT: 20 U/L (ref 0–44)
AST: 22 U/L (ref 15–41)
Albumin: 3.8 g/dL (ref 3.5–5.0)
Alkaline Phosphatase: 136 U/L — ABNORMAL HIGH (ref 38–126)
Anion gap: 8 (ref 5–15)
BUN: 14 mg/dL (ref 8–23)
CO2: 25 mmol/L (ref 22–32)
Calcium: 8.9 mg/dL (ref 8.9–10.3)
Chloride: 107 mmol/L (ref 98–111)
Creatinine: 1.01 mg/dL (ref 0.61–1.24)
GFR, Estimated: >60 mL/min (ref >60)
Glucose, Bld: 174 mg/dL — ABNORMAL HIGH (ref 70–99)
Potassium: 3.9 mmol/L (ref 3.5–5.1)
Sodium: 140 mmol/L (ref 135–145)
Total Bilirubin: 0.4 mg/dL (ref 0.3–1.2)
Total Protein: 6.6 g/dL (ref 6.5–8.1)

## 2021-08-13 LAB — CBC WITH DIFFERENTIAL (CANCER CENTER ONLY)
Abs Immature Granulocytes: 0.02 10*3/uL (ref 0.00–0.07)
Basophils Absolute: 0.1 10*3/uL (ref 0.0–0.1)
Basophils Relative: 1 %
Eosinophils Absolute: 0.4 10*3/uL (ref 0.0–0.5)
Eosinophils Relative: 5 %
HCT: 32.2 % — ABNORMAL LOW (ref 39.0–52.0)
Hemoglobin: 10.7 g/dL — ABNORMAL LOW (ref 13.0–17.0)
Immature Granulocytes: 0 %
Lymphocytes Relative: 22 %
Lymphs Abs: 1.9 10*3/uL (ref 0.7–4.0)
MCH: 31 pg (ref 26.0–34.0)
MCHC: 33.2 g/dL (ref 30.0–36.0)
MCV: 93.3 fL (ref 80.0–100.0)
Monocytes Absolute: 1 10*3/uL (ref 0.1–1.0)
Monocytes Relative: 12 %
Neutro Abs: 5 10*3/uL (ref 1.7–7.7)
Neutrophils Relative %: 60 %
Platelet Count: 237 10*3/uL (ref 150–400)
RBC: 3.45 MIL/uL — ABNORMAL LOW (ref 4.22–5.81)
RDW: 14.6 % (ref 11.5–15.5)
WBC Count: 8.4 10*3/uL (ref 4.0–10.5)
nRBC: 0 % (ref 0.0–0.2)

## 2021-08-13 MED ORDER — SODIUM CHLORIDE 0.9% FLUSH
10.0000 mL | INTRAVENOUS | Status: DC | PRN
  Administered 2021-08-13: 10 mL

## 2021-08-13 MED ORDER — SODIUM CHLORIDE 0.9 % IV SOLN
800.0000 mg/m2 | Freq: Once | INTRAVENOUS | Status: AC
  Administered 2021-08-13: 1444 mg via INTRAVENOUS
  Filled 2021-08-13: qty 26.3

## 2021-08-13 MED ORDER — PROCHLORPERAZINE MALEATE 10 MG PO TABS
10.0000 mg | ORAL_TABLET | Freq: Once | ORAL | Status: AC
  Administered 2021-08-13: 10 mg via ORAL
  Filled 2021-08-13: qty 1

## 2021-08-13 MED ORDER — HEPARIN SOD (PORK) LOCK FLUSH 100 UNIT/ML IV SOLN
500.0000 [IU] | Freq: Once | INTRAVENOUS | Status: AC | PRN
  Administered 2021-08-13: 500 [IU]

## 2021-08-13 MED ORDER — SODIUM CHLORIDE 0.9 % IV SOLN: Freq: Once | INTRAVENOUS | Status: AC

## 2021-08-13 MED ORDER — PACLITAXEL PROTEIN-BOUND CHEMO INJECTION 100 MG
100.0000 mg/m2 | Freq: Once | INTRAVENOUS | Status: AC
  Administered 2021-08-13: 175 mg via INTRAVENOUS
  Filled 2021-08-13: qty 35

## 2021-08-13 NOTE — Progress Notes (Signed)
Patient seen by Dr. Benay Spice today  Vitals are within treatment parameters.  Labs reviewed by Dr. Benay Spice and are within treatment parameters.  Per physician team, patient is ready for treatment. Please note that modifications are being made to the treatment plan including MD has added Abraxane back in today.

## 2021-08-13 NOTE — Progress Notes (Signed)
[place] OFFICE PROGRESS NOTE   Diagnosis: Pancreas cancer  INTERVAL HISTORY:   Francisco Frank completed another treat with gemcitabine 07/28/2021.  He had no apparent side effects from chemotherapy.  No rash or fever.  He continues to have numbness in the fingers and toes.  He has difficulty buttoning his shirt.  The numbness has not changed.  He reports the numbness began at the start of the gemcitabine/Abraxane chemotherapy.  Objective:  Vital signs in last 24 hours:  Blood pressure 116/73, pulse 64, temperature 98.2 F (36.8 C), temperature source Oral, resp. rate 18, height '5\' 8"'$  (1.727 m), weight 158 lb (71.7 kg), SpO2 98 %.    HEENT: No thrush or ulcers Resp: Lungs clear bilaterally Cardio: Regular rate and rhythm GI: No mass, no hepatosplenomegaly, nontender Vascular: No leg edema Neuro: Mild loss of vibratory sense at the fingertips bilaterally   Portacath/PICC-without erythema  Lab Results:  Lab Results  Component Value Date   WBC 8.4 08/13/2021   HGB 10.7 (L) 08/13/2021   HCT 32.2 (L) 08/13/2021   MCV 93.3 08/13/2021   PLT 237 08/13/2021   NEUTROABS 5.0 08/13/2021    CMP  Lab Results  Component Value Date   NA 140 08/13/2021   K 3.9 08/13/2021   CL 107 08/13/2021   CO2 25 08/13/2021   GLUCOSE 174 (H) 08/13/2021   BUN 14 08/13/2021   CREATININE 1.01 08/13/2021   CALCIUM 8.9 08/13/2021   PROT 6.6 08/13/2021   ALBUMIN 3.8 08/13/2021   AST 22 08/13/2021   ALT 20 08/13/2021   ALKPHOS 136 (H) 08/13/2021   BILITOT 0.4 08/13/2021   GFRNONAA >60 08/13/2021    Lab Results  Component Value Date   CAN199 592 (H) 07/28/2021      Medications: I have reviewed the patient's current medications.   Assessment/Plan: Pancreas cancer Outside CT-apparent pancreas abnormality (we do not have a copy of the report) MRI abdomen 11/06/2020-pancreatic body and tail atrophy with duct dilatation up to 9 mm.  Both ducts undergo an abrupt transition in  the region of the pancreatic head.  Non border deforming pancreatic head mass measuring 3.2 x 2.6 cm.  No arterial involvement by tumor.  SMV adjacent to presumed tumor without encasement.  Portal vein uninvolved.  No abdominal adenopathy.  Left periaortic 7 mm node not pathologic by size criteria.  No ascites.  Subtle left-sided pericolonic nodule 5 mm. EUS/ERCP 11/11/2020-EGD showed gastritis which was biopsied, mucosal changes in the duodenum, mucosal changes in the duodenum sweep (gastric antral and oxyntic mucosa with no specific histopathologic changes; Warthin Starry stain negative for H pylori).  EUS showed an irregular mass in the pancreatic head measuring 26 mm x 30 mm; the outer margins were irregular.  There was sonographic evidence suggesting invasion into the portal vein (manifested by abutment) and the superior mesenteric vein (manifested by abutment); an intact interface was seen between the mass and the superior mesenteric artery and celiac trunk suggesting a lack of invasion.  Endosonographic imaging in the visualized portion of the liver showed no mass.  There was dilatation of the common bile duct and the common hepatic duct.  No malignant appearing lymph nodes were visualized in the celiac region, peripancreatic region, porta hepatis region.  FNA biopsy of the pancreas head mass showed malignant cells consistent with adenocarcinoma.  Biliary access could not be obtained.  Cholangiogram 11/12/2020-severe intrahepatic biliary dilatation.  A peripheral right hepatic bile duct was successfully cannulated.  Internal/external biliary drain  placed.  Cytology on biliary drain bile showed malignant cells consistent with adenocarcinoma.   CT chest 11/13/2020-no evidence of metastatic disease.   CT pelvis 11/13/2020-nonspecific circumferential wall thickening of the cecum measuring up to 12 mm in thickness.  Otherwise no evidence of intrapelvic metastases.   11/19/2020-biliary stent placement, biliary  drain exchange 11/28/2020-exchange of internal/external biliary drain secondary to persistent jaundice CT abdomen/pelvis 11/28/2020-pancreas head mass, 3.5 x 2.8 cm, no encasement of the celiac trunk or SMA, partial encasement of the portal venous confluence-at least 180 degrees, partial encasement of the SMV-approximately 180 degrees, ventral extension of tumor abuts the posterior duodenum, no adenopathy, percutaneous biliary stent with decompression of previous dilated intrahepatic and common bile ducts Cycle 1 FOLFIRINOX 01/01/2021, irinotecan held Cycle 2 FOLFIRINOX 01/13/2021, irinotecan held Cycle 3 FOLFIRINOX 01/28/2021 Cycle 4 FOLFIRINOX 02/09/2021 Cycle 5 FOLFIRINOX 03/03/2021, Ziextenzo Cycle 6 FOLFIRINOX 03/25/2021, Ziextenzo CT abdomen/pelvis 04/06/2021-no change in pancreas head mass with partial encasement of the portal venous confluence, nonocclusive SMV thrombus, borderline upper abdominal lymph nodes-unchanged, no evidence of liver metastases Cycle 7 FOLFIRINOX 04/08/2021,Ziextenzo Cycle 8 FOLFIRINOX 04/22/2021, Ziextenzo 05/01/2021-multidisciplinary consultation at [facility], changed to gemcitabine/Abraxane recommended Cycle 1 gemcitabine/Abraxane 05/06/2021 Cycle 2 gemcitabine/Abraxane 05/22/2021, gemcitabine dose reduced secondary to thrombocytopenia Cycle 3 gemcitabine/Abraxane 06/05/2021 Cycle 4 gemcitabine/Abraxane 06/19/2021 07/03/2021 CTs at [facility]-ill-defined hypoenhancing pancreatic head mass measures 2.3 cm.  Focal abutment and distortion/invasion of SMV with slightly increased nonocclusive thrombus within the portal vein and upper SMV.  Similar indeterminate retroperitoneal lymph nodes.  Mild central intrahepatic biliary dilatation with loss of previously seen pneumobilia. Dr. Trey Paula recommendation is for another month of chemotherapy and then restaging prior to surgery Cycle 5 gemcitabine 07/13/2021, Abraxane held due to neuropathy Cycle 6 gemcitabine 07/28/2021, Abraxane held due to  neuropathy Cycle 7 gemcitabine/Abraxane 08/13/2021   Painless jaundice-internal/external biliary drain placed 11/12/2020.  Biliary stent placed and drain exchange 11/19/2020, exchange of internal/external drain 11/28/2020 Weight loss Diabetes diagnosed June 2022 Change in bowel habits February 2022 Colonoscopy 08/06/2020-8 mm polyp in the sigmoid colon, a few small and large mouth diverticula in the sigmoid colon, normal mucosa found in the entire colon with biopsies for histology taken with a cold forceps from the right colon and left colon for evaluation of microscopic colitis (right colon-colonic mucosa with no significant pathologic findings; left colon-colonic mucosa with no significant pathologic findings; sigmoid polyp-tubular adenoma, negative for high-grade dysplasia). 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  He is tolerating the chemotherapy well.  The CA 19-9 has been higher over the past month.  Francisco Frank would like to add Abraxane back into the chemotherapy regimen today.  He understands the potential for progressive neuropathy.  He would like to see with gemcitabine/Abraxane today.  He is scheduled for follow-up at [facility] next week.  He will return for an office visit in 2 weeks.  Betsy Coder, MD  08/13/2021  10:31 AM

## 2021-08-13 NOTE — Patient Instructions (Addendum)
[facility]  Discharge Instructions: Thank you for choosing [facility] to provide your oncology and hematology care.   If you have a lab appointment with the [facility], please go directly to the [facility] and check in at the registration area.   Wear comfortable clothing and clothing appropriate for easy access to any Portacath or PICC line.   We strive to give you quality time with your provider. You may need to reschedule your appointment if you arrive late (15 or more minutes).  Arriving late affects you and other patients whose appointments are after yours.  Also, if you miss three or more appointments without notifying the office, you may be dismissed from the clinic at the provider's discretion.      For prescription refill requests, have your pharmacy contact our office and allow 72 hours for refills to be completed.    Today you received the following chemotherapy and/or immunotherapy agents: gemcitabine      To help prevent nausea and vomiting after your treatment, we encourage you to take your nausea medication as directed.  BELOW ARE SYMPTOMS THAT SHOULD BE REPORTED IMMEDIATELY: *FEVER GREATER THAN 100.4 F (38 C) OR HIGHER *CHILLS OR SWEATING *NAUSEA AND VOMITING THAT IS NOT CONTROLLED WITH YOUR NAUSEA MEDICATION *UNUSUAL SHORTNESS OF BREATH *UNUSUAL BRUISING OR BLEEDING *URINARY PROBLEMS (pain or burning when urinating, or frequent urination) *BOWEL PROBLEMS (unusual diarrhea, constipation, pain near the anus) TENDERNESS IN MOUTH AND THROAT WITH OR WITHOUT PRESENCE OF ULCERS (sore throat, sores in mouth, or a toothache) UNUSUAL RASH, SWELLING OR PAIN  UNUSUAL VAGINAL DISCHARGE OR ITCHING   Items with * indicate a potential emergency and should be followed up as soon as possible or go to the Emergency Department if any problems should occur.  Please show the CHEMOTHERAPY ALERT CARD or IMMUNOTHERAPY ALERT CARD at check-in to  the Emergency Department and triage nurse.  Should you have questions after your visit or need to cancel or reschedule your appointment, please contact [facility]  Dept: [phone]  and follow the prompts.  Office hours are 8:00 a.m. to 4:30 p.m. Monday - Friday. Please note that voicemails left after 4:00 p.m. may not be returned until the following business day.  We are closed weekends and major holidays. You have access to a nurse at all times for urgent questions. Please call the main number to the clinic Dept: [phone] and follow the prompts.   For any non-urgent questions, you may also contact your provider using MyChart. We now offer e-Visits for anyone 27 and older to request care online for non-urgent symptoms. For details visit [email or web address].   Also download the MyChart app! Go to the app store, search "MyChart", open the app, select [facility], and log in with your MyChart username and password.  Due to Covid, a mask is required upon entering the hospital/clinic. If you do not have a mask, one will be given to you upon arrival. For doctor visits, patients may have 1 support person aged 1 or older with them. For treatment visits, patients cannot have anyone with them due to current Covid guidelines and our immunocompromised population.   Gemcitabine injection What is this medication? GEMCITABINE (jem SYE ta been) is a chemotherapy drug. This medicine is used to treat many types of cancer like breast cancer, lung cancer, pancreatic cancer, and ovarian cancer. This medicine may be used for other purposes; ask your health care provider or  pharmacist if you have questions. COMMON BRAND NAME(S): Gemzar, Infugem What should I tell my care team before I take this medication? They need to know if you have any of these conditions: blood disorders infection kidney disease liver disease lung or breathing disease, like asthma recent or ongoing  radiation therapy an unusual or allergic reaction to gemcitabine, other chemotherapy, other medicines, foods, dyes, or preservatives pregnant or trying to get pregnant breast-feeding How should I use this medication? This drug is given as an infusion into a vein. It is administered in a hospital or clinic by a specially trained health care professional. Talk to your pediatrician regarding the use of this medicine in children. Special care may be needed. Overdosage: If you think you have taken too much of this medicine contact a poison control center or emergency room at once. NOTE: This medicine is only for you. Do not share this medicine with others. What if I miss a dose? It is important not to miss your dose. Call your doctor or health care professional if you are unable to keep an appointment. What may interact with this medication? medicines to increase blood counts like filgrastim, pegfilgrastim, sargramostim some other chemotherapy drugs like cisplatin vaccines Talk to your doctor or health care professional before taking any of these medicines: acetaminophen aspirin ibuprofen ketoprofen naproxen This list may not describe all possible interactions. Give your health care provider a list of all the medicines, herbs, non-prescription drugs, or dietary supplements you use. Also tell them if you smoke, drink alcohol, or use illegal drugs. Some items may interact with your medicine. What should I watch for while using this medication? Visit your doctor for checks on your progress. This drug may make you feel generally unwell. This is not uncommon, as chemotherapy can affect healthy cells as well as cancer cells. Report any side effects. Continue your course of treatment even though you feel ill unless your doctor tells you to stop. In some cases, you may be given additional medicines to help with side effects. Follow all directions for their use. Call your doctor or health care  professional for advice if you get a fever, chills or sore throat, or other symptoms of a cold or flu. Do not treat yourself. This drug decreases your body's ability to fight infections. Try to avoid being around people who are sick. This medicine may increase your risk to bruise or bleed. Call your doctor or health care professional if you notice any unusual bleeding. Be careful brushing and flossing your teeth or using a toothpick because you may get an infection or bleed more easily. If you have any dental work done, tell your dentist you are receiving this medicine. Avoid taking products that contain aspirin, acetaminophen, ibuprofen, naproxen, or ketoprofen unless instructed by your doctor. These medicines may hide a fever. Do not become pregnant while taking this medicine or for 6 months after stopping it. Women should inform their doctor if they wish to become pregnant or think they might be pregnant. Men should not father a child while taking this medicine and for 3 months after stopping it. There is a potential for serious side effects to an unborn child. Talk to your health care professional or pharmacist for more information. Do not breast-feed an infant while taking this medicine or for at least 1 week after stopping it. Men should inform their doctors if they wish to father a child. This medicine may lower sperm counts. Talk with your doctor or health care  professional if you are concerned about your fertility. What side effects may I notice from receiving this medication? Side effects that you should report to your doctor or health care professional as soon as possible: allergic reactions like skin rash, itching or hives, swelling of the face, lips, or tongue breathing problems pain, redness, or irritation at site where injected signs and symptoms of a dangerous change in heartbeat or heart rhythm like chest pain; dizziness; fast or irregular heartbeat; palpitations; feeling faint or  lightheaded, falls; breathing problems signs of decreased platelets or bleeding - bruising, pinpoint red spots on the skin, black, tarry stools, blood in the urine signs of decreased red blood cells - unusually weak or tired, feeling faint or lightheaded, falls signs of infection - fever or chills, cough, sore throat, pain or difficulty passing urine signs and symptoms of kidney injury like trouble passing urine or change in the amount of urine signs and symptoms of liver injury like dark yellow or brown urine; general ill feeling or flu-like symptoms; light-colored stools; loss of appetite; nausea; right upper belly pain; unusually weak or tired; yellowing of the eyes or skin swelling of ankles, feet, hands Side effects that usually do not require medical attention (report to your doctor or health care professional if they continue or are bothersome): constipation diarrhea hair loss loss of appetite nausea rash vomiting This list may not describe all possible side effects. Call your doctor for medical advice about side effects. You may report side effects to FDA at 1-800-FDA-1088. Where should I keep my medication? This drug is given in a hospital or clinic and will not be stored at home. NOTE: This sheet is a summary. It may not cover all possible information. If you have questions about this medicine, talk to your doctor, pharmacist, or health care provider.  2023 Elsevier/Gold Standard (2017-06-01 00:00:00)

## 2021-08-14 LAB — CANCER ANTIGEN 19-9: CA 19-9: 1139 U/mL — ABNORMAL HIGH (ref 0–35)

## 2021-08-23 ENCOUNTER — Other Ambulatory Visit: Payer: Self-pay | Admitting: Oncology

## 2021-08-28 ENCOUNTER — Inpatient Hospital Stay: Payer: Medicare Other | Attending: Nurse Practitioner

## 2021-08-28 ENCOUNTER — Inpatient Hospital Stay: Payer: Medicare Other

## 2021-08-28 ENCOUNTER — Other Ambulatory Visit: Payer: Self-pay

## 2021-08-28 ENCOUNTER — Inpatient Hospital Stay (HOSPITAL_BASED_OUTPATIENT_CLINIC_OR_DEPARTMENT_OTHER): Payer: Medicare Other | Admitting: Oncology

## 2021-08-28 VITALS — BP 134/91 | HR 58 | Temp 98.1°F | Resp 18 | Ht 68.0 in | Wt 156.8 lb

## 2021-08-28 DIAGNOSIS — C250 Malignant neoplasm of head of pancreas: Secondary | ICD-10-CM | POA: Insufficient documentation

## 2021-08-28 DIAGNOSIS — I10 Essential (primary) hypertension: Secondary | ICD-10-CM | POA: Diagnosis not present

## 2021-08-28 DIAGNOSIS — R1010 Upper abdominal pain, unspecified: Secondary | ICD-10-CM | POA: Insufficient documentation

## 2021-08-28 DIAGNOSIS — R630 Anorexia: Secondary | ICD-10-CM | POA: Insufficient documentation

## 2021-08-28 LAB — CBC WITH DIFFERENTIAL (CANCER CENTER ONLY)
Abs Immature Granulocytes: 0.03 10*3/uL (ref 0.00–0.07)
Basophils Absolute: 0.1 10*3/uL (ref 0.0–0.1)
Basophils Relative: 1 %
Eosinophils Absolute: 0.4 10*3/uL (ref 0.0–0.5)
Eosinophils Relative: 5 %
HCT: 32.3 % — ABNORMAL LOW (ref 39.0–52.0)
Hemoglobin: 10.6 g/dL — ABNORMAL LOW (ref 13.0–17.0)
Immature Granulocytes: 0 %
Lymphocytes Relative: 24 %
Lymphs Abs: 1.8 10*3/uL (ref 0.7–4.0)
MCH: 30.1 pg (ref 26.0–34.0)
MCHC: 32.8 g/dL (ref 30.0–36.0)
MCV: 91.8 fL (ref 80.0–100.0)
Monocytes Absolute: 0.9 10*3/uL (ref 0.1–1.0)
Monocytes Relative: 12 %
Neutro Abs: 4.5 10*3/uL (ref 1.7–7.7)
Neutrophils Relative %: 58 %
Platelet Count: 239 10*3/uL (ref 150–400)
RBC: 3.52 MIL/uL — ABNORMAL LOW (ref 4.22–5.81)
RDW: 14.6 % (ref 11.5–15.5)
WBC Count: 7.7 10*3/uL (ref 4.0–10.5)
nRBC: 0 % (ref 0.0–0.2)

## 2021-08-28 MED ORDER — SODIUM CHLORIDE 0.9% FLUSH
10.0000 mL | Freq: Once | INTRAVENOUS | Status: AC
  Administered 2021-08-28: 10 mL via INTRAVENOUS

## 2021-08-28 MED ORDER — HEPARIN SOD (PORK) LOCK FLUSH 100 UNIT/ML IV SOLN
500.0000 [IU] | Freq: Once | INTRAVENOUS | Status: AC
  Administered 2021-08-28: 500 [IU] via INTRAVENOUS

## 2021-08-28 NOTE — Progress Notes (Signed)
[place] OFFICE PROGRESS NOTE   Diagnosis: Pancreas cancer  INTERVAL HISTORY:   Francisco Frank completed another cycle of gemcitabine/Abraxane on 08/13/2021.  He reports worsening of numbness in the fingers and toes.  He otherwise feels well.  No pain. He was seen at [facility] for a restaging evaluation on 08/21/2021.  Francisco Frank contacted me.  The plan is to proceed with replacement of the bile duct stent prior to considering surgery.  He is scheduled for an ERCP on 09/01/2021. Objective:  Vital signs in last 24 hours:  Blood pressure (!) 134/91, pulse (!) 58, temperature 98.1 F (36.7 C), temperature source Oral, resp. rate 18, height '5\' 8"'$  (1.727 m), weight 156 lb 12.8 oz (71.1 kg), SpO2 100 %.    HEENT: No thrush or ulcers Lymphatics: No cervical, supraclavicular, axillary, or inguinal nodes Resp: Lungs clear bilaterally  Cardio: Regular rate and rhythm GI: No hepatosplenomegaly, no mass, nontender Vascular: No leg edema   Portacath/PICC-without erythema  Lab Results:  Lab Results  Component Value Date   WBC 7.7 08/28/2021   HGB 10.6 (L) 08/28/2021   HCT 32.3 (L) 08/28/2021   MCV 91.8 08/28/2021   PLT 239 08/28/2021   NEUTROABS 4.5 08/28/2021    CMP  Lab Results  Component Value Date   NA 140 08/13/2021   K 3.9 08/13/2021   CL 107 08/13/2021   CO2 25 08/13/2021   GLUCOSE 174 (H) 08/13/2021   BUN 14 08/13/2021   CREATININE 1.01 08/13/2021   CALCIUM 8.9 08/13/2021   PROT 6.6 08/13/2021   ALBUMIN 3.8 08/13/2021   AST 22 08/13/2021   ALT 20 08/13/2021   ALKPHOS 136 (H) 08/13/2021   BILITOT 0.4 08/13/2021   GFRNONAA >60 08/13/2021    Lab Results  Component Value Date   WUJ811 1,139 (H) 08/13/2021    Lab Results  Component Value Date   INR 1.1 12/13/2020   LABPROT 14.0 12/13/2020    Imaging:  No results found.  Medications: I have reviewed the patient's current medications.   Assessment/Plan: Pancreas cancer Outside CT-apparent  pancreas abnormality (we do not have a copy of the report) MRI abdomen 11/06/2020-pancreatic body and tail atrophy with duct dilatation up to 9 mm.  Both ducts undergo an abrupt transition in the region of the pancreatic head.  Non border deforming pancreatic head mass measuring 3.2 x 2.6 cm.  No arterial involvement by tumor.  SMV adjacent to presumed tumor without encasement.  Portal vein uninvolved.  No abdominal adenopathy.  Left periaortic 7 mm node not pathologic by size criteria.  No ascites.  Subtle left-sided pericolonic nodule 5 mm. EUS/ERCP 11/11/2020-EGD showed gastritis which was biopsied, mucosal changes in the duodenum, mucosal changes in the duodenum sweep (gastric antral and oxyntic mucosa with no specific histopathologic changes; Warthin Starry stain negative for H pylori).  EUS showed an irregular mass in the pancreatic head measuring 26 mm x 30 mm; the outer margins were irregular.  There was sonographic evidence suggesting invasion into the portal vein (manifested by abutment) and the superior mesenteric vein (manifested by abutment); an intact interface was seen between the mass and the superior mesenteric artery and celiac trunk suggesting a lack of invasion.  Endosonographic imaging in the visualized portion of the liver showed no mass.  There was dilatation of the common bile duct and the common hepatic duct.  No malignant appearing lymph nodes were visualized in the celiac region, peripancreatic region, porta hepatis region.  FNA biopsy of the  pancreas head mass showed malignant cells consistent with adenocarcinoma.  Biliary access could not be obtained.  Cholangiogram 11/12/2020-severe intrahepatic biliary dilatation.  A peripheral right hepatic bile duct was successfully cannulated.  Internal/external biliary drain placed.  Cytology on biliary drain bile showed malignant cells consistent with adenocarcinoma.   CT chest 11/13/2020-no evidence of metastatic disease.   CT pelvis  11/13/2020-nonspecific circumferential wall thickening of the cecum measuring up to 12 mm in thickness.  Otherwise no evidence of intrapelvic metastases.   11/19/2020-biliary stent placement, biliary drain exchange 11/28/2020-exchange of internal/external biliary drain secondary to persistent jaundice CT abdomen/pelvis 11/28/2020-pancreas head mass, 3.5 x 2.8 cm, no encasement of the celiac trunk or SMA, partial encasement of the portal venous confluence-at least 180 degrees, partial encasement of the SMV-approximately 180 degrees, ventral extension of tumor abuts the posterior duodenum, no adenopathy, percutaneous biliary stent with decompression of previous dilated intrahepatic and common bile ducts Cycle 1 FOLFIRINOX 01/01/2021, irinotecan held Cycle 2 FOLFIRINOX 01/13/2021, irinotecan held Cycle 3 FOLFIRINOX 01/28/2021 Cycle 4 FOLFIRINOX 02/09/2021 Cycle 5 FOLFIRINOX 03/03/2021, Ziextenzo Cycle 6 FOLFIRINOX 03/25/2021, Ziextenzo CT abdomen/pelvis 04/06/2021-no change in pancreas head mass with partial encasement of the portal venous confluence, nonocclusive SMV thrombus, borderline upper abdominal lymph nodes-unchanged, no evidence of liver metastases Cycle 7 FOLFIRINOX 04/08/2021,Ziextenzo Cycle 8 FOLFIRINOX 04/22/2021, Ziextenzo 05/01/2021-multidisciplinary consultation at [facility], changed to gemcitabine/Abraxane recommended Cycle 1 gemcitabine/Abraxane 05/06/2021 Cycle 2 gemcitabine/Abraxane 05/22/2021, gemcitabine dose reduced secondary to thrombocytopenia Cycle 3 gemcitabine/Abraxane 06/05/2021 Cycle 4 gemcitabine/Abraxane 06/19/2021 07/03/2021 CTs at [facility]-ill-defined hypoenhancing pancreatic head mass measures 2.3 cm.  Focal abutment and distortion/invasion of SMV with slightly increased nonocclusive thrombus within the portal vein and upper SMV.  Similar indeterminate retroperitoneal lymph nodes.  Mild central intrahepatic biliary dilatation with loss of previously seen pneumobilia. Francisco Frank  recommendation is for another month of chemotherapy and then restaging prior to surgery Cycle 5 gemcitabine 07/13/2021, Abraxane held due to neuropathy Cycle 6 gemcitabine 07/28/2021, Abraxane held due to neuropathy Cycle 7 gemcitabine/Abraxane 08/13/2021 CT[facility] at [facility] 08/21/2021-similar nonocclusive thrombus in the portal confluence extending into the SMV, increased intrahepatic biliary ductal dilatation, stable hypoenhancing pancreas head mass with short segment abutment of the portal vein/SMV, similar prominent retroperitoneal nodes   Painless jaundice-internal/external biliary drain placed 11/12/2020.  Biliary stent placed and drain exchange 11/19/2020, exchange of internal/external drain 11/28/2020 Weight loss Diabetes diagnosed June 2022 Change in bowel habits February 2022 Colonoscopy 08/06/2020-8 mm polyp in the sigmoid colon, a few small and large mouth diverticula in the sigmoid colon, normal mucosa found in the entire colon with biopsies for histology taken with a cold forceps from the right colon and left colon for evaluation of microscopic colitis (right colon-colonic mucosa with no significant pathologic findings; left colon-colonic mucosa with no significant pathologic findings; sigmoid polyp-tubular adenoma, negative for high-grade dysplasia). 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  He has completed a course of neoadjuvant chemotherapy.  Restaging CTs at [facility] 08/21/2021 revealed stable disease.  There was CT evidence of stent occlusion.  He is scheduled for revision of the bile duct stent in the next week. I discussed the case with Francisco Frank.  The plan is to proceed with stent revision next week followed by surgery later this month.  He is scheduled for a telehealth  visit with Francisco Frank next week. Francisco. Frank will be  scheduled for an office visit here approximately 3 weeks following surgery.  I am available to see him sooner as needed.   Betsy Coder, MD  08/28/2021  10:29 AM

## 2021-08-29 LAB — CANCER ANTIGEN 19-9: CA 19-9: 1172 U/mL — ABNORMAL HIGH (ref 0–35)

## 2021-09-14 ENCOUNTER — Encounter: Payer: Self-pay | Admitting: *Deleted

## 2021-09-17 ENCOUNTER — Inpatient Hospital Stay (HOSPITAL_BASED_OUTPATIENT_CLINIC_OR_DEPARTMENT_OTHER): Payer: Medicare Other | Admitting: Oncology

## 2021-09-17 VITALS — BP 103/86 | HR 60 | Temp 98.1°F | Resp 18 | Ht 68.0 in | Wt 155.2 lb

## 2021-09-17 DIAGNOSIS — C250 Malignant neoplasm of head of pancreas: Secondary | ICD-10-CM

## 2021-09-17 NOTE — Progress Notes (Signed)
[place] OFFICE PROGRESS NOTE   Diagnosis: Pancreas cancer  INTERVAL HISTORY:   Francisco Frank saw Dr. Hyman Hopes on 09/11/2021.  The CA 19-9 was higher despite replacement of the biliary stent.  Dr. Hyman Hopes does not recommend surgery given the likelihood of systemic disease progression.  Francisco Frank has developed anorexia and upper abdominal discomfort.  He takes tramadol as needed.  He continues to have peripheral neuropathy symptoms.  Objective:  Vital signs in last 24 hours:  Blood pressure 103/86, pulse 60, temperature 98.1 F (36.7 C), temperature source Oral, resp. rate 18, height '5\' 8"'$  (1.727 m), weight 155 lb 3.2 oz (70.4 kg), SpO2 99 %.    Resp: Lungs clear bilaterally Cardio: Regular rate and rhythm GI: Nontender, no mass, no hepatosplenomegaly Vascular: No leg edema    Portacath/PICC-without erythema  Lab Results:  Lab Results  Component Value Date   WBC 7.7 08/28/2021   HGB 10.6 (L) 08/28/2021   HCT 32.3 (L) 08/28/2021   MCV 91.8 08/28/2021   PLT 239 08/28/2021   NEUTROABS 4.5 08/28/2021    CMP  Lab Results  Component Value Date   NA 140 08/13/2021   K 3.9 08/13/2021   CL 107 08/13/2021   CO2 25 08/13/2021   GLUCOSE 174 (H) 08/13/2021   BUN 14 08/13/2021   CREATININE 1.01 08/13/2021   CALCIUM 8.9 08/13/2021   PROT 6.6 08/13/2021   ALBUMIN 3.8 08/13/2021   AST 22 08/13/2021   ALT 20 08/13/2021   ALKPHOS 136 (H) 08/13/2021   BILITOT 0.4 08/13/2021   GFRNONAA >60 08/13/2021    Lab Results  Component Value Date   CAN199 1,172 (H) 08/28/2021    Lab Results  Component Value Date   INR 1.1 12/13/2020   LABPROT 14.0 12/13/2020    Imaging:  No results found.  Medications: I have reviewed the patient's current medications.   Assessment/Plan: Pancreas cancer Outside CT-apparent pancreas abnormality (we do not have a copy of the report) MRI abdomen 11/06/2020-pancreatic body and tail atrophy with duct dilatation up to 9 mm.   Both ducts undergo an abrupt transition in the region of the pancreatic head.  Non border deforming pancreatic head mass measuring 3.2 x 2.6 cm.  No arterial involvement by tumor.  SMV adjacent to presumed tumor without encasement.  Portal vein uninvolved.  No abdominal adenopathy.  Left periaortic 7 mm node not pathologic by size criteria.  No ascites.  Subtle left-sided pericolonic nodule 5 mm. EUS/ERCP 11/11/2020-EGD showed gastritis which was biopsied, mucosal changes in the duodenum, mucosal changes in the duodenum sweep (gastric antral and oxyntic mucosa with no specific histopathologic changes; Warthin Starry stain negative for H pylori).  EUS showed an irregular mass in the pancreatic head measuring 26 mm x 30 mm; the outer margins were irregular.  There was sonographic evidence suggesting invasion into the portal vein (manifested by abutment) and the superior mesenteric vein (manifested by abutment); an intact interface was seen between the mass and the superior mesenteric artery and celiac trunk suggesting a lack of invasion.  Endosonographic imaging in the visualized portion of the liver showed no mass.  There was dilatation of the common bile duct and the common hepatic duct.  No malignant appearing lymph nodes were visualized in the celiac region, peripancreatic region, porta hepatis region.  FNA biopsy of the pancreas head mass showed malignant cells consistent with adenocarcinoma.  Biliary access could not be obtained.  Cholangiogram 11/12/2020-severe intrahepatic biliary dilatation.  A peripheral right hepatic  bile duct was successfully cannulated.  Internal/external biliary drain placed.  Cytology on biliary drain bile showed malignant cells consistent with adenocarcinoma.   CT chest 11/13/2020-no evidence of metastatic disease.   CT pelvis 11/13/2020-nonspecific circumferential wall thickening of the cecum measuring up to 12 mm in thickness.  Otherwise no evidence of intrapelvic metastases.    11/19/2020-biliary stent placement, biliary drain exchange 11/28/2020-exchange of internal/external biliary drain secondary to persistent jaundice CT abdomen/pelvis 11/28/2020-pancreas head mass, 3.5 x 2.8 cm, no encasement of the celiac trunk or SMA, partial encasement of the portal venous confluence-at least 180 degrees, partial encasement of the SMV-approximately 180 degrees, ventral extension of tumor abuts the posterior duodenum, no adenopathy, percutaneous biliary stent with decompression of previous dilated intrahepatic and common bile ducts Cycle 1 FOLFIRINOX 01/01/2021, irinotecan held Cycle 2 FOLFIRINOX 01/13/2021, irinotecan held Cycle 3 FOLFIRINOX 01/28/2021 Cycle 4 FOLFIRINOX 02/09/2021 Cycle 5 FOLFIRINOX 03/03/2021, Ziextenzo Cycle 6 FOLFIRINOX 03/25/2021, Ziextenzo CT abdomen/pelvis 04/06/2021-no change in pancreas head mass with partial encasement of the portal venous confluence, nonocclusive SMV thrombus, borderline upper abdominal lymph nodes-unchanged, no evidence of liver metastases Cycle 7 FOLFIRINOX 04/08/2021,Ziextenzo Cycle 8 FOLFIRINOX 04/22/2021, Ziextenzo 05/01/2021-multidisciplinary consultation at [facility], changed to gemcitabine/Abraxane recommended Cycle 1 gemcitabine/Abraxane 05/06/2021 Cycle 2 gemcitabine/Abraxane 05/22/2021, gemcitabine dose reduced secondary to thrombocytopenia Cycle 3 gemcitabine/Abraxane 06/05/2021 Cycle 4 gemcitabine/Abraxane 06/19/2021 07/03/2021 CTs at [facility]-ill-defined hypoenhancing pancreatic head mass measures 2.3 cm.  Focal abutment and distortion/invasion of SMV with slightly increased nonocclusive thrombus within the portal vein and upper SMV.  Similar indeterminate retroperitoneal lymph nodes.  Mild central intrahepatic biliary dilatation with loss of previously seen pneumobilia. Dr. Trey Paula recommendation is for another month of chemotherapy and then restaging prior to surgery Cycle 5 gemcitabine 07/13/2021, Abraxane held due to neuropathy Cycle 6  gemcitabine 07/28/2021, Abraxane held due to neuropathy Cycle 7 gemcitabine/Abraxane 08/13/2021 CT[facility] at [facility] 08/21/2021-similar nonocclusive thrombus in the portal confluence extending into the SMV, increased intrahepatic biliary ductal dilatation, stable hypoenhancing pancreas head mass with short segment abutment of the portal vein/SMV, similar prominent retroperitoneal nodes Progressive elevation of CA 19-9 09/11/2021   Painless jaundice-internal/external biliary drain placed 11/12/2020.  Biliary stent placed and drain exchange 11/19/2020, exchange of internal/external drain 11/28/2020 09/01/2021-ERCP, repositioning of biliary stent, placement of a second covered metal stent in the common bile duct Weight loss Diabetes diagnosed June 2022 Change in bowel habits February 2022 Colonoscopy 08/06/2020-8 mm polyp in the sigmoid colon, a few small and large mouth diverticula in the sigmoid colon, normal mucosa found in the entire colon with biopsies for histology taken with a cold forceps from the right colon and left colon for evaluation of microscopic colitis (right colon-colonic mucosa with no significant pathologic findings; left colon-colonic mucosa with no significant pathologic findings; sigmoid polyp-tubular adenoma, negative for high-grade dysplasia). 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  He underwent a prolonged course of neoadjuvant chemotherapy with the plan to undergo surgical resection.  The CA 19-9 has been higher over the past few months.  I agree with Dr. Hyman Hopes that this is likely an indicator of systemic progression.  He has developed anorexia and pain over the past few weeks.  He is no longer a surgical candidate.  I discussed treatment options with Francisco.  Frank including comfort care, palliative radiation, and salvage systemic therapy.  I think it is unlikely  radiation would impact on his survival, but could improve pain.  We discussed salvage chemotherapy options including 5-FU/liposomal irinotecan.  He understands the expected low response rate with this regimen.  The goal of salvage systemic therapy is to palliate his symptoms and potentially prolong survival.  He agrees to a trial of 5-FU/liposomal irinotecan.  We reviewed potential toxicities associated with this regimen including the chance of nausea/vomiting, hematologic toxicity, alopecia, and diarrhea.  He agrees to proceed.  He will continue tramadol as needed for pain.  He will contact us if he does not have adequate pain relief with tramadol.  The plan is to initiate 5-FU/liposomal irinotecan 09/29/2021 to be given on a 2-week schedule.  We will submit his peripheral blood for [employer] testing to look for a targetable alteration.  A chemotherapy plan was entered today.  Betsy Coder, MD  09/17/2021  12:45 PM

## 2021-09-17 NOTE — Progress Notes (Signed)
DISCONTINUE OFF PATHWAY REGIMEN - Pancreatic Adenocarcinoma   [id number]:Gemcitabine 1,000 mg/m2 IV D1,8,15 + Nab-Paclitaxel 125 mg/m2 IV D1,8,15 q28 Days:   A cycle is every 28 days:     Nab-paclitaxel (protein bound)      Gemcitabine   **Always confirm dose/schedule in your pharmacy ordering system**  REASON: Disease Progression PRIOR TREATMENT: Off Pathway: Gemcitabine 1,000 mg/m2 IV D1,8,15 + Nab-Paclitaxel 125 mg/m2 IV D1,8,15 q28 Days TREATMENT RESPONSE: Progressive Disease (PD)  START OFF PATHWAY REGIMEN - Pancreatic Adenocarcinoma   [id number]:Fluorouracil 1,200 mg/m2/day CIV D1-2 + Leucovorin 400 mg/m2 IV D1 + Liposomal Irinotecan 70 mg/m2 IV D1 q14 Days:   A cycle is every 14 days:     Liposomal irinotecan      Leucovorin      Fluorouracil   **Always confirm dose/schedule in your pharmacy ordering system**  Patient Characteristics: Metastatic Disease, Third Line and Beyond, MSS/pMMR or MSI Unknown Therapeutic Status: Metastatic Disease Line of Therapy: Third Line and Beyond Microsatellite/Mismatch Repair Status: Unknown Intent of Therapy: Non-Curative / Palliative Intent, Discussed with Patient 

## 2021-09-26 ENCOUNTER — Other Ambulatory Visit: Payer: Self-pay | Admitting: Oncology

## 2021-09-28 NOTE — Progress Notes (Signed)
Pharmacist Chemotherapy Monitoring - Initial Assessment    Anticipated start date: 09/29/21   The following has been reviewed per standard work regarding the patient's treatment regimen: The patient's diagnosis, treatment plan and drug doses, and organ/hematologic function Lab orders and baseline tests specific to treatment regimen  The treatment plan start date, drug sequencing, and pre-medications Prior authorization status  Patient's documented medication list, including drug-drug interaction screen and prescriptions for anti-emetics and supportive care specific to the treatment regimen The drug concentrations, fluid compatibility, administration routes, and timing of the medications to be used The patient's access for treatment and lifetime cumulative dose history, if applicable  The patient's medication allergies and previous infusion related reactions, if applicable   Changes made to treatment plan:  N/A  Follow up needed:  N/A   Francisco Frank, [facility], 09/28/2021  4:26 PM

## 2021-09-29 ENCOUNTER — Encounter: Payer: Self-pay | Admitting: Oncology

## 2021-09-29 ENCOUNTER — Inpatient Hospital Stay: Payer: Medicare Other

## 2021-09-29 ENCOUNTER — Telehealth: Payer: Self-pay | Admitting: Nurse Practitioner

## 2021-09-29 ENCOUNTER — Other Ambulatory Visit (HOSPITAL_BASED_OUTPATIENT_CLINIC_OR_DEPARTMENT_OTHER): Payer: Self-pay

## 2021-09-29 ENCOUNTER — Inpatient Hospital Stay: Payer: Medicare Other | Attending: Nurse Practitioner

## 2021-09-29 ENCOUNTER — Other Ambulatory Visit: Payer: Self-pay | Admitting: *Deleted

## 2021-09-29 VITALS — BP 144/79 | HR 59 | Temp 98.2°F | Resp 20 | Ht 68.0 in | Wt 157.0 lb

## 2021-09-29 DIAGNOSIS — C250 Malignant neoplasm of head of pancreas: Secondary | ICD-10-CM

## 2021-09-29 DIAGNOSIS — Z5111 Encounter for antineoplastic chemotherapy: Secondary | ICD-10-CM | POA: Diagnosis not present

## 2021-09-29 LAB — CMP (CANCER CENTER ONLY)
ALT: 20 U/L (ref 0–44)
AST: 20 U/L (ref 15–41)
Albumin: 4.1 g/dL (ref 3.5–5.0)
Alkaline Phosphatase: 121 U/L (ref 38–126)
Anion gap: 9 (ref 5–15)
BUN: 13 mg/dL (ref 8–23)
CO2: 26 mmol/L (ref 22–32)
Calcium: 9.3 mg/dL (ref 8.9–10.3)
Chloride: 107 mmol/L (ref 98–111)
Creatinine: 0.88 mg/dL (ref 0.61–1.24)
GFR, Estimated: >60 mL/min (ref >60)
Glucose, Bld: 204 mg/dL — ABNORMAL HIGH (ref 70–99)
Potassium: 3.6 mmol/L (ref 3.5–5.1)
Sodium: 142 mmol/L (ref 135–145)
Total Bilirubin: 0.5 mg/dL (ref 0.3–1.2)
Total Protein: 6.9 g/dL (ref 6.5–8.1)

## 2021-09-29 LAB — CBC WITH DIFFERENTIAL (CANCER CENTER ONLY)
Abs Immature Granulocytes: 0.01 10*3/uL (ref 0.00–0.07)
Basophils Absolute: 0.1 10*3/uL (ref 0.0–0.1)
Basophils Relative: 1 %
Eosinophils Absolute: 0.4 10*3/uL (ref 0.0–0.5)
Eosinophils Relative: 6 %
HCT: 33.1 % — ABNORMAL LOW (ref 39.0–52.0)
Hemoglobin: 10.9 g/dL — ABNORMAL LOW (ref 13.0–17.0)
Immature Granulocytes: 0 %
Lymphocytes Relative: 25 %
Lymphs Abs: 1.9 10*3/uL (ref 0.7–4.0)
MCH: 29.5 pg (ref 26.0–34.0)
MCHC: 32.9 g/dL (ref 30.0–36.0)
MCV: 89.7 fL (ref 80.0–100.0)
Monocytes Absolute: 0.7 10*3/uL (ref 0.1–1.0)
Monocytes Relative: 9 %
Neutro Abs: 4.5 10*3/uL (ref 1.7–7.7)
Neutrophils Relative %: 59 %
Platelet Count: 181 10*3/uL (ref 150–400)
RBC: 3.69 MIL/uL — ABNORMAL LOW (ref 4.22–5.81)
RDW: 14.8 % (ref 11.5–15.5)
WBC Count: 7.6 10*3/uL (ref 4.0–10.5)
nRBC: 0 % (ref 0.0–0.2)

## 2021-09-29 MED ORDER — SODIUM CHLORIDE 0.9 % IV SOLN
70.0000 mg/m2 | Freq: Once | INTRAVENOUS | Status: AC
  Administered 2021-09-29: 129 mg via INTRAVENOUS
  Filled 2021-09-29: qty 30

## 2021-09-29 MED ORDER — SODIUM CHLORIDE 0.9 % IV SOLN
10.0000 mg | Freq: Once | INTRAVENOUS | Status: AC
  Administered 2021-09-29: 10 mg via INTRAVENOUS
  Filled 2021-09-29: qty 10

## 2021-09-29 MED ORDER — HYOSCYAMINE SULFATE 0.125 MG PO TABS
0.1250 mg | ORAL_TABLET | Freq: Four times a day (QID) | ORAL | 0 refills | Status: DC | PRN
  Filled 2021-09-29: qty 60, 15d supply, fill #0

## 2021-09-29 MED ORDER — SODIUM CHLORIDE 0.9 % IV SOLN
2400.0000 mg/m2 | INTRAVENOUS | Status: DC
  Administered 2021-09-29: 4400 mg via INTRAVENOUS
  Filled 2021-09-29: qty 88

## 2021-09-29 MED ORDER — SODIUM CHLORIDE 0.9 % IV SOLN
400.0000 mg/m2 | Freq: Once | INTRAVENOUS | Status: AC
  Administered 2021-09-29: 736 mg via INTRAVENOUS
  Filled 2021-09-29: qty 25

## 2021-09-29 MED ORDER — PALONOSETRON HCL INJECTION 0.25 MG/5ML
0.2500 mg | Freq: Once | INTRAVENOUS | Status: AC
  Administered 2021-09-29: 0.25 mg via INTRAVENOUS
  Filled 2021-09-29: qty 5

## 2021-09-29 MED ORDER — SODIUM CHLORIDE 0.9 % IV SOLN: Freq: Once | INTRAVENOUS | Status: AC

## 2021-09-29 NOTE — Patient Instructions (Addendum)
Francisco Frank   Discharge Instructions: Thank you for choosing [facility] to provide your oncology and hematology care.   If you have a lab appointment with the [facility], please go directly to the [facility] and check in at the registration area.   Wear comfortable clothing and clothing appropriate for easy access to any Portacath or PICC line.   We strive to give you quality time with your provider. You may need to reschedule your appointment if you arrive late (15 or more minutes).  Arriving late affects you and other patients whose appointments are after yours.  Also, if you miss three or more appointments without notifying the office, you may be dismissed from the clinic at the provider's discretion.      For prescription refill requests, have your pharmacy contact our office and allow 72 hours for refills to be completed.    Today you received the following chemotherapy and/or immunotherapy agents Irinotecan LIPOSOME (ONIVYDE), Leucovorin & Flourouracil (ADRUCIL).      To help prevent nausea and vomiting after your treatment, we encourage you to take your nausea medication as directed.  BELOW ARE SYMPTOMS THAT SHOULD BE REPORTED IMMEDIATELY: *FEVER GREATER THAN 100.4 F (38 C) OR HIGHER *CHILLS OR SWEATING *NAUSEA AND VOMITING THAT IS NOT CONTROLLED WITH YOUR NAUSEA MEDICATION *UNUSUAL SHORTNESS OF BREATH *UNUSUAL BRUISING OR BLEEDING *URINARY PROBLEMS (pain or burning when urinating, or frequent urination) *BOWEL PROBLEMS (unusual diarrhea, constipation, pain near the anus) TENDERNESS IN MOUTH AND THROAT WITH OR WITHOUT PRESENCE OF ULCERS (sore throat, sores in mouth, or a toothache) UNUSUAL RASH, SWELLING OR PAIN  UNUSUAL VAGINAL DISCHARGE OR ITCHING   Items with * indicate a potential emergency and should be followed up as soon as possible or go to the Emergency Department if any problems should occur.  Please show the  CHEMOTHERAPY ALERT CARD or IMMUNOTHERAPY ALERT CARD at check-in to the Emergency Department and triage nurse.  Should you have questions after your visit or need to cancel or reschedule your appointment, please contact [facility]  Dept: [phone]  and follow the prompts.  Office hours are 8:00 a.m. to 4:30 p.m. Monday - Friday. Please note that voicemails left after 4:00 p.m. may not be returned until the following business day.  We are closed weekends and major holidays. You have access to a nurse at all times for urgent questions. Please call the main number to the clinic Dept: [phone] and follow the prompts.   For any non-urgent questions, you may also contact your provider using MyChart. We now offer e-Visits for anyone 48 and older to request care online for non-urgent symptoms. For details visit [email or web address].   Also download the MyChart app! Go to the app store, search "MyChart", open the app, select [facility], and log in with your MyChart username and password.  Masks are optional in the cancer centers. If you would like for your care team to wear a mask while they are taking care of you, please let them know. For doctor visits, patients may have with them one support person who is at least 72 years old. At this time, visitors are not allowed in the infusion area.  Irinotecan Liposomal injection What is this medication? IRINOTECAN LIPOSOME (eye ri noe TEE kan LIP oh som) is a chemotherapy drug. It is used to treat pancreatic cancer. This medicine may be used for other purposes; ask your health care provider or pharmacist  if you have questions. COMMON BRAND NAME(S): ONIVYDE What should I tell my care team before I take this medication? They need to know if you have any of these conditions: bleeding disorders dehydration history of blood diseases, like sickle cell anemia or leukemia history of low levels of calcium, magnesium, or potassium in  the blood infection (especially a virus infection such as chickenpox, cold sores, or herpes) liver disease low blood counts, like low white cell, platelet, or red cell counts lung or breathing disease, like asthma recent or ongoing radiation therapy an unusual or allergic reaction to irinotecan liposome, other medicines, foods, dyes, or preservatives pregnant or trying to get pregnant breast-feeding How should I use this medication? This drug is given as an infusion into a vein. It is administered in a hospital or clinic by a specially trained health care professional. Talk to your pediatrician regarding the use of this medicine in children. Special care may be needed. Overdosage: If you think you have taken too much of this medicine contact a poison control center or emergency room at once. NOTE: This medicine is only for you. Do not share this medicine with others. What if I miss a dose? It is important not to miss your dose. Call your doctor or health care professional if you are unable to keep an appointment. What may interact with this medication? This medicine may interact with the following medications: antiviral medicines for HIV or AIDS certain medications for fungal infections like ketoconazole, itraconazole, voriconazole certain medications for seizures like carbamazepine, fosphenytoin, phenytoin clarithromycin gemfibrozil mephobarbital nefazodone phenobarbital primidone rifabutin rifampin rifapentine St. John's Wort telaprevir This list may not describe all possible interactions. Give your health care provider a list of all the medicines, herbs, non-prescription drugs, or dietary supplements you use. Also tell them if you smoke, drink alcohol, or use illegal drugs. Some items may interact with your medicine. What should I watch for while using this medication? Check with your doctor or health care professional if you get an attack of severe diarrhea, nausea and vomiting,  or if you sweat a lot. The loss of too much body fluid can make it dangerous for you to take this medicine. This medicine may interfere with the ability to have a child. You should talk with your doctor or health care professional if you are concerned about your fertility. Do not become pregnant while taking this medicine or for 1 month after the last dose; males with male partners should use condoms during treatment and for 4 months after the last dose. Women should inform their doctor if they wish to become pregnant or think they might be pregnant. There is a potential for serious side effects to an unborn child. Talk to your health care professional or pharmacist for more information. Do not breast-feed an infant while taking this medicine or for 1 month after the last dose. Avoid taking products that contain aspirin, acetaminophen, ibuprofen, naproxen, or ketoprofen unless instructed by your doctor. These medicines may hide a fever. Be careful brushing and flossing your teeth or using a toothpick because you may get an infection or bleed more easily. If you have any dental work done, tell your dentist you are receiving this medicine. Call your doctor or health care professional for advice if you get a fever, chills or sore throat, or other symptoms of a cold or flu. Do not treat yourself. This drug decreases your body's ability to fight infections. Try to avoid being around people who are sick.  This medicine may increase your risk to bruise or bleed. Call your doctor or health care professional if you notice any unusual bleeding. This drug may make you feel generally unwell. This is not uncommon, as chemotherapy can affect healthy cells as well as cancer cells. Report any side effects. Continue your course of treatment even though you feel ill unless your doctor tells you to stop. What side effects may I notice from receiving this medication? Side effects that you should report to your doctor or  health care professional as soon as possible: allergic reactions like skin rash, itching or hives, swelling of the face, lips, or tongue breathing problems cough diarrhea low blood counts - this medicine may decrease the number of white blood cells, red blood cells and platelets. You may be at increased risk for infections and bleeding nausea, vomiting signs and symptoms of bleeding such as bloody or black, tarry stools; red or dark-brown urine; spitting up blood or brown material that looks like coffee grounds; red spots on the skin; unusual bruising or bleeding from the eye, gums, or nose signs of decreased red blood cells - unusually weak or tired, feeling faint or lightheaded, falls signs and symptoms of infection like fever or chills; cough; sore throat; pain or trouble passing urine signs and symptoms of liver injury like dark yellow or brown urine; general ill feeling or flu-like symptoms; light-colored stools; loss of appetite; nausea; right upper belly pain; unusually weak or tired; yellowing of the eyes or skin stomach pain Side effects that usually do not require medical attention (report to your doctor or health care professional if they continue or are bothersome): hair loss loss of appetite mouth sores upset stomach This list may not describe all possible side effects. Call your doctor for medical advice about side effects. You may report side effects to FDA at 1-800-FDA-1088. Where should I keep my medication? This drug is given in a hospital or clinic and will not be stored at home. NOTE: This sheet is a summary. It may not cover all possible information. If you have questions about this medicine, talk to your doctor, pharmacist, or health care provider.  2023 Elsevier/Gold Standard (2015-04-10 00:00:00) Leucovorin injection What is this medication? LEUCOVORIN (loo koe VOR in) is used to prevent or treat the harmful effects of some medicines. This medicine is used to treat  anemia caused by a low amount of folic acid in the body. It is also used with 5-fluorouracil (5-FU) to treat colon cancer. This medicine may be used for other purposes; ask your health care provider or pharmacist if you have questions. What should I tell my care team before I take this medication? They need to know if you have any of these conditions: anemia from low levels of vitamin B-12 in the blood an unusual or allergic reaction to leucovorin, folic acid, other medicines, foods, dyes, or preservatives pregnant or trying to get pregnant breast-feeding How should I use this medication? This medicine is for injection into a muscle or into a vein. It is given by a health care professional in a hospital or clinic setting. Talk to your pediatrician regarding the use of this medicine in children. Special care may be needed. Overdosage: If you think you have taken too much of this medicine contact a poison control center or emergency room at once. NOTE: This medicine is only for you. Do not share this medicine with others. What if I miss a dose? This does not apply. What may  interact with this medication? capecitabine fluorouracil phenobarbital phenytoin primidone trimethoprim-sulfamethoxazole This list may not describe all possible interactions. Give your health care provider a list of all the medicines, herbs, non-prescription drugs, or dietary supplements you use. Also tell them if you smoke, drink alcohol, or use illegal drugs. Some items may interact with your medicine. What should I watch for while using this medication? Your condition will be monitored carefully while you are receiving this medicine. This medicine may increase the side effects of 5-fluorouracil, 5-FU. Tell your doctor or health care professional if you have diarrhea or mouth sores that do not get better or that get worse. What side effects may I notice from receiving this medication? Side effects that you should report  to your doctor or health care professional as soon as possible: allergic reactions like skin rash, itching or hives, swelling of the face, lips, or tongue breathing problems fever, infection mouth sores unusual bleeding or bruising unusually weak or tired Side effects that usually do not require medical attention (report to your doctor or health care professional if they continue or are bothersome): constipation or diarrhea loss of appetite nausea, vomiting This list may not describe all possible side effects. Call your doctor for medical advice about side effects. You may report side effects to FDA at 1-800-FDA-1088. Where should I keep my medication? This drug is given in a hospital or clinic and will not be stored at home. NOTE: This sheet is a summary. It may not cover all possible information. If you have questions about this medicine, talk to your doctor, pharmacist, or health care provider.  2023 Elsevier/Gold Standard (2007-09-14 00:00:00) Fluorouracil, 5-FU injection What is this medication? FLUOROURACIL, 5-FU (flure oh YOOR a sil) is a chemotherapy drug. It slows the growth of cancer cells. This medicine is used to treat many types of cancer like breast cancer, colon or rectal cancer, pancreatic cancer, and stomach cancer. This medicine may be used for other purposes; ask your health care provider or pharmacist if you have questions. COMMON BRAND NAME(S): Adrucil What should I tell my care team before I take this medication? They need to know if you have any of these conditions: blood disorders dihydropyrimidine dehydrogenase (DPD) deficiency infection (especially a virus infection such as chickenpox, cold sores, or herpes) kidney disease liver disease malnourished, poor nutrition recent or ongoing radiation therapy an unusual or allergic reaction to fluorouracil, other chemotherapy, other medicines, foods, dyes, or preservatives pregnant or trying to get  pregnant breast-feeding How should I use this medication? This drug is given as an infusion or injection into a vein. It is administered in a hospital or clinic by a specially trained health care professional. Talk to your pediatrician regarding the use of this medicine in children. Special care may be needed. Overdosage: If you think you have taken too much of this medicine contact a poison control center or emergency room at once. NOTE: This medicine is only for you. Do not share this medicine with others. What if I miss a dose? It is important not to miss your dose. Call your doctor or health care professional if you are unable to keep an appointment. What may interact with this medication? Do not take this medicine with any of the following medications: live virus vaccines This medicine may also interact with the following medications: medicines that treat or prevent blood clots like warfarin, enoxaparin, and dalteparin This list may not describe all possible interactions. Give your health care provider a list of  all the medicines, herbs, non-prescription drugs, or dietary supplements you use. Also tell them if you smoke, drink alcohol, or use illegal drugs. Some items may interact with your medicine. What should I watch for while using this medication? Visit your doctor for checks on your progress. This drug may make you feel generally unwell. This is not uncommon, as chemotherapy can affect healthy cells as well as cancer cells. Report any side effects. Continue your course of treatment even though you feel ill unless your doctor tells you to stop. In some cases, you may be given additional medicines to help with side effects. Follow all directions for their use. Call your doctor or health care professional for advice if you get a fever, chills or sore throat, or other symptoms of a cold or flu. Do not treat yourself. This drug decreases your body's ability to fight infections. Try to avoid  being around people who are sick. This medicine may increase your risk to bruise or bleed. Call your doctor or health care professional if you notice any unusual bleeding. Be careful brushing and flossing your teeth or using a toothpick because you may get an infection or bleed more easily. If you have any dental work done, tell your dentist you are receiving this medicine. Avoid taking products that contain aspirin, acetaminophen, ibuprofen, naproxen, or ketoprofen unless instructed by your doctor. These medicines may hide a fever. Do not become pregnant while taking this medicine. Women should inform their doctor if they wish to become pregnant or think they might be pregnant. There is a potential for serious side effects to an unborn child. Talk to your health care professional or pharmacist for more information. Do not breast-feed an infant while taking this medicine. Men should inform their doctor if they wish to father a child. This medicine may lower sperm counts. Do not treat diarrhea with over the counter products. Contact your doctor if you have diarrhea that lasts more than 2 days or if it is severe and watery. This medicine can make you more sensitive to the sun. Keep out of the sun. If you cannot avoid being in the sun, wear protective clothing and use sunscreen. Do not use sun lamps or tanning beds/booths. What side effects may I notice from receiving this medication? Side effects that you should report to your doctor or health care professional as soon as possible: allergic reactions like skin rash, itching or hives, swelling of the face, lips, or tongue low blood counts - this medicine may decrease the number of white blood cells, red blood cells and platelets. You may be at increased risk for infections and bleeding. signs of infection - fever or chills, cough, sore throat, pain or difficulty passing urine signs of decreased platelets or bleeding - bruising, pinpoint red spots on the  skin, black, tarry stools, blood in the urine signs of decreased red blood cells - unusually weak or tired, fainting spells, lightheadedness breathing problems changes in vision chest pain mouth sores nausea and vomiting pain, swelling, redness at site where injected pain, tingling, numbness in the hands or feet redness, swelling, or sores on hands or feet stomach pain unusual bleeding Side effects that usually do not require medical attention (report to your doctor or health care professional if they continue or are bothersome): changes in finger or toe nails diarrhea dry or itchy skin hair loss headache loss of appetite sensitivity of eyes to the light stomach upset unusually teary eyes This list may not describe all  possible side effects. Call your doctor for medical advice about side effects. You may report side effects to FDA at 1-800-FDA-1088. Where should I keep my medication? This drug is given in a hospital or clinic and will not be stored at home. NOTE: This sheet is a summary. It may not cover all possible information. If you have questions about this medicine, talk to your doctor, pharmacist, or health care provider.  2023 Elsevier/Gold Standard (2021-02-06 00:00:00)  The chemotherapy medication bag should finish at 46 hours, 96 hours, or 7 days. For example, if your pump is scheduled for 46 hours and it was put on at 4:00 p.m., it should finish at 2:00 p.m. the day it is scheduled to come off regardless of your appointment time.     Estimated time to finish at 1:00 Thursday, October 01, 2021.   If the display on your pump reads "Low Volume" and it is beeping, take the batteries out of the pump and come to the cancer center for it to be taken off.   If the pump alarms go off prior to the pump reading "Low Volume" then call [phone] and someone can assist you.  If the plunger comes out and the chemotherapy medication is leaking out, please use your home chemo spill  kit to clean up the spill. Do NOT use paper towels or other household products.  If you have problems or questions regarding your pump, please call either [phone] (24 hours a day) or the cancer center Monday-Friday 8:00 a.m.- 4:30 p.m. at the clinic number and we will assist you. If you are unable to get assistance, then go to the nearest Emergency Department and ask the staff to contact the IV team for assistance.

## 2021-09-29 NOTE — Telephone Encounter (Signed)
I contacted Francisco Frank to discuss pain medication.  He had requested a stronger pain medication be prescribed when he was here today for chemotherapy.  He has previously taken tramadol which is no longer effective.  We discussed that hydrocodone is on his allergy list with a reaction of itching.  He thinks he also had itching with Percocet but is not completely sure about this.  He tried Levsin earlier today and notes improvement.  He would like to continue with Levsin and tramadol for now.  He will contact the office if this is not effective.

## 2021-09-29 NOTE — Progress Notes (Signed)
In treatment room today. Patient requesting refill on hyoscyamine. Also need pain med and asking for stronger than Tramadol. Tramadol no longer effective. Reports fatigue as well and asking if MD could order anything for this. Note on MD desk with his requests.

## 2021-09-30 ENCOUNTER — Telehealth: Payer: Self-pay

## 2021-09-30 LAB — CANCER ANTIGEN 19-9: CA 19-9: 6984 U/mL — ABNORMAL HIGH (ref 0–35)

## 2021-09-30 NOTE — Telephone Encounter (Signed)
24 Hour Call Back  Telephone call to patient post first time Irinotecan Liposomal / Folfox. Patient denies any concerns. Verbalizes feeling fatigued and getting some rest. He knows to come for his pump stop appointment tomorrow and to call with any concerns that may arise before the appointment.

## 2021-10-01 ENCOUNTER — Inpatient Hospital Stay: Payer: Medicare Other

## 2021-10-01 VITALS — BP 123/79 | HR 53 | Temp 98.3°F | Resp 18

## 2021-10-01 DIAGNOSIS — C250 Malignant neoplasm of head of pancreas: Secondary | ICD-10-CM

## 2021-10-01 DIAGNOSIS — Z5111 Encounter for antineoplastic chemotherapy: Secondary | ICD-10-CM | POA: Diagnosis not present

## 2021-10-01 MED ORDER — HEPARIN SOD (PORK) LOCK FLUSH 100 UNIT/ML IV SOLN
500.0000 [IU] | Freq: Once | INTRAVENOUS | Status: AC | PRN
  Administered 2021-10-01: 500 [IU]

## 2021-10-01 MED ORDER — SODIUM CHLORIDE 0.9% FLUSH
10.0000 mL | INTRAVENOUS | Status: DC | PRN
  Administered 2021-10-01: 10 mL

## 2021-10-05 ENCOUNTER — Other Ambulatory Visit: Payer: Self-pay | Admitting: Nurse Practitioner

## 2021-10-05 DIAGNOSIS — C250 Malignant neoplasm of head of pancreas: Secondary | ICD-10-CM

## 2021-10-05 MED ORDER — OXYCODONE HCL 5 MG PO TABS: 5.0000 mg | ORAL_TABLET | ORAL | 0 refills | Status: DC | PRN

## 2021-10-07 LAB — GUARDANT 360

## 2021-10-09 ENCOUNTER — Inpatient Hospital Stay: Payer: Medicare Other | Admitting: Oncology

## 2021-10-11 ENCOUNTER — Other Ambulatory Visit: Payer: Self-pay | Admitting: Oncology

## 2021-10-12 ENCOUNTER — Other Ambulatory Visit: Payer: Self-pay

## 2021-10-13 ENCOUNTER — Encounter: Payer: Self-pay | Admitting: *Deleted

## 2021-10-13 ENCOUNTER — Inpatient Hospital Stay (HOSPITAL_BASED_OUTPATIENT_CLINIC_OR_DEPARTMENT_OTHER): Payer: Medicare Other | Admitting: Oncology

## 2021-10-13 ENCOUNTER — Inpatient Hospital Stay: Payer: Medicare Other

## 2021-10-13 VITALS — BP 121/81 | HR 67 | Temp 98.2°F | Resp 18 | Ht 68.0 in | Wt 154.6 lb

## 2021-10-13 DIAGNOSIS — C250 Malignant neoplasm of head of pancreas: Secondary | ICD-10-CM | POA: Diagnosis not present

## 2021-10-13 DIAGNOSIS — Z5111 Encounter for antineoplastic chemotherapy: Secondary | ICD-10-CM | POA: Diagnosis not present

## 2021-10-13 LAB — CBC WITH DIFFERENTIAL (CANCER CENTER ONLY)
Abs Immature Granulocytes: 0.02 10*3/uL (ref 0.00–0.07)
Basophils Absolute: 0.1 10*3/uL (ref 0.0–0.1)
Basophils Relative: 1 %
Eosinophils Absolute: 0.4 10*3/uL (ref 0.0–0.5)
Eosinophils Relative: 6 %
HCT: 32.3 % — ABNORMAL LOW (ref 39.0–52.0)
Hemoglobin: 10.6 g/dL — ABNORMAL LOW (ref 13.0–17.0)
Immature Granulocytes: 0 %
Lymphocytes Relative: 31 %
Lymphs Abs: 2.1 10*3/uL (ref 0.7–4.0)
MCH: 29.6 pg (ref 26.0–34.0)
MCHC: 32.8 g/dL (ref 30.0–36.0)
MCV: 90.2 fL (ref 80.0–100.0)
Monocytes Absolute: 0.6 10*3/uL (ref 0.1–1.0)
Monocytes Relative: 9 %
Neutro Abs: 3.6 10*3/uL (ref 1.7–7.7)
Neutrophils Relative %: 53 %
Platelet Count: 193 10*3/uL (ref 150–400)
RBC: 3.58 MIL/uL — ABNORMAL LOW (ref 4.22–5.81)
RDW: 14.8 % (ref 11.5–15.5)
WBC Count: 6.9 10*3/uL (ref 4.0–10.5)
nRBC: 0 % (ref 0.0–0.2)

## 2021-10-13 LAB — CMP (CANCER CENTER ONLY)
ALT: 24 U/L (ref 0–44)
AST: 22 U/L (ref 15–41)
Albumin: 4 g/dL (ref 3.5–5.0)
Alkaline Phosphatase: 121 U/L (ref 38–126)
Anion gap: 11 (ref 5–15)
BUN: 10 mg/dL (ref 8–23)
CO2: 24 mmol/L (ref 22–32)
Calcium: 9.2 mg/dL (ref 8.9–10.3)
Chloride: 107 mmol/L (ref 98–111)
Creatinine: 0.98 mg/dL (ref 0.61–1.24)
GFR, Estimated: >60 mL/min (ref >60)
Glucose, Bld: 199 mg/dL — ABNORMAL HIGH (ref 70–99)
Potassium: 3.5 mmol/L (ref 3.5–5.1)
Sodium: 142 mmol/L (ref 135–145)
Total Bilirubin: 0.4 mg/dL (ref 0.3–1.2)
Total Protein: 6.2 g/dL — ABNORMAL LOW (ref 6.5–8.1)

## 2021-10-13 MED ORDER — SODIUM CHLORIDE 0.9 % IV SOLN
70.0000 mg/m2 | Freq: Once | INTRAVENOUS | Status: AC
  Administered 2021-10-13: 129 mg via INTRAVENOUS
  Filled 2021-10-13: qty 30

## 2021-10-13 MED ORDER — SODIUM CHLORIDE 0.9 % IV SOLN
10.0000 mg | Freq: Once | INTRAVENOUS | Status: AC
  Administered 2021-10-13: 10 mg via INTRAVENOUS
  Filled 2021-10-13: qty 1

## 2021-10-13 MED ORDER — SODIUM CHLORIDE 0.9 % IV SOLN
400.0000 mg/m2 | Freq: Once | INTRAVENOUS | Status: AC
  Administered 2021-10-13: 736 mg via INTRAVENOUS
  Filled 2021-10-13: qty 36.8

## 2021-10-13 MED ORDER — SODIUM CHLORIDE 0.9 % IV SOLN
2400.0000 mg/m2 | INTRAVENOUS | Status: DC
  Administered 2021-10-13: 4400 mg via INTRAVENOUS
  Filled 2021-10-13: qty 88

## 2021-10-13 MED ORDER — SODIUM CHLORIDE 0.9 % IV SOLN: Freq: Once | INTRAVENOUS | Status: AC

## 2021-10-13 MED ORDER — PALONOSETRON HCL INJECTION 0.25 MG/5ML
0.2500 mg | Freq: Once | INTRAVENOUS | Status: AC
  Administered 2021-10-13: 0.25 mg via INTRAVENOUS
  Filled 2021-10-13: qty 5

## 2021-10-13 NOTE — Progress Notes (Signed)
Patient seen by Dr. Sherrill today ? ?Vitals are within treatment parameters. ? ?Labs reviewed by Dr. Sherrill and are within treatment parameters. ? ?Per physician team, patient is ready for treatment and there are NO modifications to the treatment plan.  ?

## 2021-10-13 NOTE — Progress Notes (Signed)
[facility] OFFICE PROGRESS NOTE   Diagnosis: Pancreas cancer  INTERVAL HISTORY:   Francisco Frank completed a cycle of 5-FU/liposomal irinotecan 09/29/2021.  No nausea/vomiting or diarrhea.  He continues to have abdominal pain.  The abdominal pain is relieved with oxycodone.  He takes approximately 2 oxycodone tablets per day.  He continues to have peripheral neuropathy symptoms.  Objective:  Vital signs in last 24 hours:  Blood pressure 121/81, pulse 67, temperature 98.2 F (36.8 C), temperature source Oral, resp. rate 18, height '5\' 8"'$  (1.727 m), weight 154 lb 9.6 oz (70.1 kg), SpO2 100 %.    HEENT: No thrush or ulcers Resp: Lungs clear bilaterally Cardio: Regular rate and rhythm GI: No hepatosplenomegaly, no mass, nontender Vascular: No leg edema  Skin: Palms without erythema  Portacath/PICC-without erythema  Lab Results:  Lab Results  Component Value Date   WBC 6.9 10/13/2021   HGB 10.6 (L) 10/13/2021   HCT 32.3 (L) 10/13/2021   MCV 90.2 10/13/2021   PLT 193 10/13/2021   NEUTROABS 3.6 10/13/2021    CMP  Lab Results  Component Value Date   NA 142 10/13/2021   K 3.5 10/13/2021   CL 107 10/13/2021   CO2 24 10/13/2021   GLUCOSE 199 (H) 10/13/2021   BUN 10 10/13/2021   CREATININE 0.98 10/13/2021   CALCIUM 9.2 10/13/2021   PROT 6.2 (L) 10/13/2021   ALBUMIN 4.0 10/13/2021   AST 22 10/13/2021   ALT 24 10/13/2021   ALKPHOS 121 10/13/2021   BILITOT 0.4 10/13/2021   GFRNONAA >60 10/13/2021    Lab Results  Component Value Date   MAU633 6,984 (H) 09/29/2021     Medications: I have reviewed the patient's current medications.   Assessment/Plan: Pancreas cancer Outside CT-apparent pancreas abnormality (we do not have a copy of the report) MRI abdomen 11/06/2020-pancreatic body and tail atrophy with duct dilatation up to 9 mm.  Both ducts undergo an abrupt transition in the region of the pancreatic head.  Non border deforming pancreatic head mass  measuring 3.2 x 2.6 cm.  No arterial involvement by tumor.  SMV adjacent to presumed tumor without encasement.  Portal vein uninvolved.  No abdominal adenopathy.  Left periaortic 7 mm node not pathologic by size criteria.  No ascites.  Subtle left-sided pericolonic nodule 5 mm. EUS/ERCP 11/11/2020-EGD showed gastritis which was biopsied, mucosal changes in the duodenum, mucosal changes in the duodenum sweep (gastric antral and oxyntic mucosa with no specific histopathologic changes; Warthin Starry stain negative for H pylori).  EUS showed an irregular mass in the pancreatic head measuring 26 mm x 30 mm; the outer margins were irregular.  There was sonographic evidence suggesting invasion into the portal vein (manifested by abutment) and the superior mesenteric vein (manifested by abutment); an intact interface was seen between the mass and the superior mesenteric artery and celiac trunk suggesting a lack of invasion.  Endosonographic imaging in the visualized portion of the liver showed no mass.  There was dilatation of the common bile duct and the common hepatic duct.  No malignant appearing lymph nodes were visualized in the celiac region, peripancreatic region, porta hepatis region.  FNA biopsy of the pancreas head mass showed malignant cells consistent with adenocarcinoma.  Biliary access could not be obtained.  Cholangiogram 11/12/2020-severe intrahepatic biliary dilatation.  A peripheral right hepatic bile duct was successfully cannulated.  Internal/external biliary drain placed.  Cytology on biliary drain bile showed malignant cells consistent with adenocarcinoma.   CT chest 11/13/2020-no  evidence of metastatic disease.   CT pelvis 11/13/2020-nonspecific circumferential wall thickening of the cecum measuring up to 12 mm in thickness.  Otherwise no evidence of intrapelvic metastases.   11/19/2020-biliary stent placement, biliary drain exchange 11/28/2020-exchange of internal/external biliary drain secondary to  persistent jaundice CT abdomen/pelvis 11/28/2020-pancreas head mass, 3.5 x 2.8 cm, no encasement of the celiac trunk or SMA, partial encasement of the portal venous confluence-at least 180 degrees, partial encasement of the SMV-approximately 180 degrees, ventral extension of tumor abuts the posterior duodenum, no adenopathy, percutaneous biliary stent with decompression of previous dilated intrahepatic and common bile ducts Cycle 1 FOLFIRINOX 01/01/2021, irinotecan held Cycle 2 FOLFIRINOX 01/13/2021, irinotecan held Cycle 3 FOLFIRINOX 01/28/2021 Cycle 4 FOLFIRINOX 02/09/2021 Cycle 5 FOLFIRINOX 03/03/2021, Ziextenzo Cycle 6 FOLFIRINOX 03/25/2021, Ziextenzo CT abdomen/pelvis 04/06/2021-no change in pancreas head mass with partial encasement of the portal venous confluence, nonocclusive SMV thrombus, borderline upper abdominal lymph nodes-unchanged, no evidence of liver metastases Cycle 7 FOLFIRINOX 04/08/2021,Ziextenzo Cycle 8 FOLFIRINOX 04/22/2021, Ziextenzo 05/01/2021-multidisciplinary consultation at [facility], changed to gemcitabine/Abraxane recommended Cycle 1 gemcitabine/Abraxane 05/06/2021 Cycle 2 gemcitabine/Abraxane 05/22/2021, gemcitabine dose reduced secondary to thrombocytopenia Cycle 3 gemcitabine/Abraxane 06/05/2021 Cycle 4 gemcitabine/Abraxane 06/19/2021 07/03/2021 CTs at [facility]-ill-defined hypoenhancing pancreatic head mass measures 2.3 cm.  Focal abutment and distortion/invasion of SMV with slightly increased nonocclusive thrombus within the portal vein and upper SMV.  Similar indeterminate retroperitoneal lymph nodes.  Mild central intrahepatic biliary dilatation with loss of previously seen pneumobilia. Dr. Trey Paula recommendation is for another month of chemotherapy and then restaging prior to surgery Cycle 5 gemcitabine 07/13/2021, Abraxane held due to neuropathy Cycle 6 gemcitabine 07/28/2021, Abraxane held due to neuropathy Cycle 7 gemcitabine/Abraxane 08/13/2021 CTs at [facility] 08/21/2021-similar  nonocclusive thrombus in the portal confluence extending into the SMV, increased intrahepatic biliary ductal dilatation, stable hypoenhancing pancreas head mass with short segment abutment of the portal vein/SMV, similar prominent retroperitoneal nodes Progressive elevation of CA 19-9 09/11/2021 Cycle 1 5-FU/liposomal irinotecan 09/29/2021 Cycle 2 5-FU/liposomal irinotecan 10/13/2021   Painless jaundice-internal/external biliary drain placed 11/12/2020.  Biliary stent placed and drain exchange 11/19/2020, exchange of internal/external drain 11/28/2020 09/01/2021-ERCP, repositioning of biliary stent, placement of a second covered metal stent in the common bile duct Weight loss Diabetes diagnosed June 2022 Change in bowel habits February 2022 Colonoscopy 08/06/2020-8 mm polyp in the sigmoid colon, a few small and large mouth diverticula in the sigmoid colon, normal mucosa found in the entire colon with biopsies for histology taken with a cold forceps from the right colon and left colon for evaluation of microscopic colitis (right colon-colonic mucosa with no significant pathologic findings; left colon-colonic mucosa with no significant pathologic findings; sigmoid polyp-tubular adenoma, negative for high-grade dysplasia). 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  He tolerated the 5-FU/liposomal irinotecan well.  He will complete cycle 2 today.  He will return for an office visit and chemotherapy in 2 weeks.  We will check the CA 19-9 when he is here in 2 weeks.  He will continue oxycodone as needed for pain.  Betsy Coder, MD  10/13/2021  9:52 AM

## 2021-10-13 NOTE — Patient Instructions (Signed)
Francisco Frank   Discharge Instructions: Thank you for choosing [facility] to provide your oncology and hematology care.   If you have a lab appointment with the [facility], please go directly to the [facility] and check in at the registration area.   Wear comfortable clothing and clothing appropriate for easy access to any Portacath or PICC line.   We strive to give you quality time with your provider. You may need to reschedule your appointment if you arrive late (15 or more minutes).  Arriving late affects you and other patients whose appointments are after yours.  Also, if you miss three or more appointments without notifying the office, you may be dismissed from the clinic at the provider's discretion.      For prescription refill requests, have your pharmacy contact our office and allow 72 hours for refills to be completed.    Today you received the following chemotherapy and/or immunotherapy agents Irinotecan Liposome (ONIVYDE), Leucovorin & Flourouracil (ADRUCIL).      To help prevent nausea and vomiting after your treatment, we encourage you to take your nausea medication as directed.  BELOW ARE SYMPTOMS THAT SHOULD BE REPORTED IMMEDIATELY: *FEVER GREATER THAN 100.4 F (38 C) OR HIGHER *CHILLS OR SWEATING *NAUSEA AND VOMITING THAT IS NOT CONTROLLED WITH YOUR NAUSEA MEDICATION *UNUSUAL SHORTNESS OF BREATH *UNUSUAL BRUISING OR BLEEDING *URINARY PROBLEMS (pain or burning when urinating, or frequent urination) *BOWEL PROBLEMS (unusual diarrhea, constipation, pain near the anus) TENDERNESS IN MOUTH AND THROAT WITH OR WITHOUT PRESENCE OF ULCERS (sore throat, sores in mouth, or a toothache) UNUSUAL RASH, SWELLING OR PAIN  UNUSUAL VAGINAL DISCHARGE OR ITCHING   Items with * indicate a potential emergency and should be followed up as soon as possible or go to the Emergency Department if any problems should occur.  Please show the  CHEMOTHERAPY ALERT CARD or IMMUNOTHERAPY ALERT CARD at check-in to the Emergency Department and triage nurse.  Should you have questions after your visit or need to cancel or reschedule your appointment, please contact [facility]  Dept: [phone]  and follow the prompts.  Office hours are 8:00 a.m. to 4:30 p.m. Monday - Friday. Please note that voicemails left after 4:00 p.m. may not be returned until the following business day.  We are closed weekends and major holidays. You have access to a nurse at all times for urgent questions. Please call the main number to the clinic Dept: [phone] and follow the prompts.   For any non-urgent questions, you may also contact your provider using MyChart. We now offer e-Visits for anyone 46 and older to request care online for non-urgent symptoms. For details visit [email or web address].   Also download the MyChart app! Go to the app store, search "MyChart", open the app, select [facility], and log in with your MyChart username and password.  Masks are optional in the cancer centers. If you would like for your care team to wear a mask while they are taking care of you, please let them know. For doctor visits, patients may have with them one support person who is at least 72 years old. At this time, visitors are not allowed in the infusion area.  Irinotecan Liposomal injection What is this medication? IRINOTECAN LIPOSOME (eye ri noe TEE kan LIP oh som) is a chemotherapy drug. It is used to treat pancreatic cancer. This medicine may be used for other purposes; ask your health care provider or pharmacist  if you have questions. COMMON BRAND NAME(S): ONIVYDE What should I tell my care team before I take this medication? They need to know if you have any of these conditions: bleeding disorders dehydration history of blood diseases, like sickle cell anemia or leukemia history of low levels of calcium, magnesium, or potassium in  the blood infection (especially a virus infection such as chickenpox, cold sores, or herpes) liver disease low blood counts, like low white cell, platelet, or red cell counts lung or breathing disease, like asthma recent or ongoing radiation therapy an unusual or allergic reaction to irinotecan liposome, other medicines, foods, dyes, or preservatives pregnant or trying to get pregnant breast-feeding How should I use this medication? This drug is given as an infusion into a vein. It is administered in a hospital or clinic by a specially trained health care professional. Talk to your pediatrician regarding the use of this medicine in children. Special care may be needed. Overdosage: If you think you have taken too much of this medicine contact a poison control center or emergency room at once. NOTE: This medicine is only for you. Do not share this medicine with others. What if I miss a dose? It is important not to miss your dose. Call your doctor or health care professional if you are unable to keep an appointment. What may interact with this medication? This medicine may interact with the following medications: antiviral medicines for HIV or AIDS certain medications for fungal infections like ketoconazole, itraconazole, voriconazole certain medications for seizures like carbamazepine, fosphenytoin, phenytoin clarithromycin gemfibrozil mephobarbital nefazodone phenobarbital primidone rifabutin rifampin rifapentine [facility]. John's Wort telaprevir This list may not describe all possible interactions. Give your health care provider a list of all the medicines, herbs, non-prescription drugs, or dietary supplements you use. Also tell them if you smoke, drink alcohol, or use illegal drugs. Some items may interact with your medicine. What should I watch for while using this medication? Check with your doctor or health care professional if you get an attack of severe diarrhea, nausea and vomiting,  or if you sweat a lot. The loss of too much body fluid can make it dangerous for you to take this medicine. This medicine may interfere with the ability to have a child. You should talk with your doctor or health care professional if you are concerned about your fertility. Do not become pregnant while taking this medicine or for 1 month after the last dose; males with male partners should use condoms during treatment and for 4 months after the last dose. Women should inform their doctor if they wish to become pregnant or think they might be pregnant. There is a potential for serious side effects to an unborn child. Talk to your health care professional or pharmacist for more information. Do not breast-feed an infant while taking this medicine or for 1 month after the last dose. Avoid taking products that contain aspirin, acetaminophen, ibuprofen, naproxen, or ketoprofen unless instructed by your doctor. These medicines may hide a fever. Be careful brushing and flossing your teeth or using a toothpick because you may get an infection or bleed more easily. If you have any dental work done, tell your dentist you are receiving this medicine. Call your doctor or health care professional for advice if you get a fever, chills or sore throat, or other symptoms of a cold or flu. Do not treat yourself. This drug decreases your body's ability to fight infections. Try to avoid being around people who are sick.  This medicine may increase your risk to bruise or bleed. Call your doctor or health care professional if you notice any unusual bleeding. This drug may make you feel generally unwell. This is not uncommon, as chemotherapy can affect healthy cells as well as cancer cells. Report any side effects. Continue your course of treatment even though you feel ill unless your doctor tells you to stop. What side effects may I notice from receiving this medication? Side effects that you should report to your doctor or  health care professional as soon as possible: allergic reactions like skin rash, itching or hives, swelling of the face, lips, or tongue breathing problems cough diarrhea low blood counts - this medicine may decrease the number of white blood cells, red blood cells and platelets. You may be at increased risk for infections and bleeding nausea, vomiting signs and symptoms of bleeding such as bloody or black, tarry stools; red or dark-brown urine; spitting up blood or brown material that looks like coffee grounds; red spots on the skin; unusual bruising or bleeding from the eye, gums, or nose signs of decreased red blood cells - unusually weak or tired, feeling faint or lightheaded, falls signs and symptoms of infection like fever or chills; cough; sore throat; pain or trouble passing urine signs and symptoms of liver injury like dark yellow or brown urine; general ill feeling or flu-like symptoms; light-colored stools; loss of appetite; nausea; right upper belly pain; unusually weak or tired; yellowing of the eyes or skin stomach pain Side effects that usually do not require medical attention (report to your doctor or health care professional if they continue or are bothersome): hair loss loss of appetite mouth sores upset stomach This list may not describe all possible side effects. Call your doctor for medical advice about side effects. You may report side effects to FDA at 1-800-FDA-1088. Where should I keep my medication? This drug is given in a hospital or clinic and will not be stored at home. NOTE: This sheet is a summary. It may not cover all possible information. If you have questions about this medicine, talk to your doctor, pharmacist, or health care provider.  2023 Elsevier/Gold Standard (2015-04-10 00:00:00) Leucovorin injection What is this medication? LEUCOVORIN (loo koe VOR in) is used to prevent or treat the harmful effects of some medicines. This medicine is used to treat  anemia caused by a low amount of folic acid in the body. It is also used with 5-fluorouracil (5-FU) to treat colon cancer. This medicine may be used for other purposes; ask your health care provider or pharmacist if you have questions. What should I tell my care team before I take this medication? They need to know if you have any of these conditions: anemia from low levels of vitamin B-12 in the blood an unusual or allergic reaction to leucovorin, folic acid, other medicines, foods, dyes, or preservatives pregnant or trying to get pregnant breast-feeding How should I use this medication? This medicine is for injection into a muscle or into a vein. It is given by a health care professional in a hospital or clinic setting. Talk to your pediatrician regarding the use of this medicine in children. Special care may be needed. Overdosage: If you think you have taken too much of this medicine contact a poison control center or emergency room at once. NOTE: This medicine is only for you. Do not share this medicine with others. What if I miss a dose? This does not apply. What may  interact with this medication? capecitabine fluorouracil phenobarbital phenytoin primidone trimethoprim-sulfamethoxazole This list may not describe all possible interactions. Give your health care provider a list of all the medicines, herbs, non-prescription drugs, or dietary supplements you use. Also tell them if you smoke, drink alcohol, or use illegal drugs. Some items may interact with your medicine. What should I watch for while using this medication? Your condition will be monitored carefully while you are receiving this medicine. This medicine may increase the side effects of 5-fluorouracil, 5-FU. Tell your doctor or health care professional if you have diarrhea or mouth sores that do not get better or that get worse. What side effects may I notice from receiving this medication? Side effects that you should report  to your doctor or health care professional as soon as possible: allergic reactions like skin rash, itching or hives, swelling of the face, lips, or tongue breathing problems fever, infection mouth sores unusual bleeding or bruising unusually weak or tired Side effects that usually do not require medical attention (report to your doctor or health care professional if they continue or are bothersome): constipation or diarrhea loss of appetite nausea, vomiting This list may not describe all possible side effects. Call your doctor for medical advice about side effects. You may report side effects to FDA at 1-800-FDA-1088. Where should I keep my medication? This drug is given in a hospital or clinic and will not be stored at home. NOTE: This sheet is a summary. It may not cover all possible information. If you have questions about this medicine, talk to your doctor, pharmacist, or health care provider.  2023 Elsevier/Gold Standard (2007-09-14 00:00:00) Fluorouracil, 5-FU injection What is this medication? FLUOROURACIL, 5-FU (flure oh YOOR a sil) is a chemotherapy drug. It slows the growth of cancer cells. This medicine is used to treat many types of cancer like breast cancer, colon or rectal cancer, pancreatic cancer, and stomach cancer. This medicine may be used for other purposes; ask your health care provider or pharmacist if you have questions. COMMON BRAND NAME(S): Adrucil What should I tell my care team before I take this medication? They need to know if you have any of these conditions: blood disorders dihydropyrimidine dehydrogenase (DPD) deficiency infection (especially a virus infection such as chickenpox, cold sores, or herpes) kidney disease liver disease malnourished, poor nutrition recent or ongoing radiation therapy an unusual or allergic reaction to fluorouracil, other chemotherapy, other medicines, foods, dyes, or preservatives pregnant or trying to get  pregnant breast-feeding How should I use this medication? This drug is given as an infusion or injection into a vein. It is administered in a hospital or clinic by a specially trained health care professional. Talk to your pediatrician regarding the use of this medicine in children. Special care may be needed. Overdosage: If you think you have taken too much of this medicine contact a poison control center or emergency room at once. NOTE: This medicine is only for you. Do not share this medicine with others. What if I miss a dose? It is important not to miss your dose. Call your doctor or health care professional if you are unable to keep an appointment. What may interact with this medication? Do not take this medicine with any of the following medications: live virus vaccines This medicine may also interact with the following medications: medicines that treat or prevent blood clots like warfarin, enoxaparin, and dalteparin This list may not describe all possible interactions. Give your health care provider a list of  all the medicines, herbs, non-prescription drugs, or dietary supplements you use. Also tell them if you smoke, drink alcohol, or use illegal drugs. Some items may interact with your medicine. What should I watch for while using this medication? Visit your doctor for checks on your progress. This drug may make you feel generally unwell. This is not uncommon, as chemotherapy can affect healthy cells as well as cancer cells. Report any side effects. Continue your course of treatment even though you feel ill unless your doctor tells you to stop. In some cases, you may be given additional medicines to help with side effects. Follow all directions for their use. Call your doctor or health care professional for advice if you get a fever, chills or sore throat, or other symptoms of a cold or flu. Do not treat yourself. This drug decreases your body's ability to fight infections. Try to avoid  being around people who are sick. This medicine may increase your risk to bruise or bleed. Call your doctor or health care professional if you notice any unusual bleeding. Be careful brushing and flossing your teeth or using a toothpick because you may get an infection or bleed more easily. If you have any dental work done, tell your dentist you are receiving this medicine. Avoid taking products that contain aspirin, acetaminophen, ibuprofen, naproxen, or ketoprofen unless instructed by your doctor. These medicines may hide a fever. Do not become pregnant while taking this medicine. Women should inform their doctor if they wish to become pregnant or think they might be pregnant. There is a potential for serious side effects to an unborn child. Talk to your health care professional or pharmacist for more information. Do not breast-feed an infant while taking this medicine. Men should inform their doctor if they wish to father a child. This medicine may lower sperm counts. Do not treat diarrhea with over the counter products. Contact your doctor if you have diarrhea that lasts more than 2 days or if it is severe and watery. This medicine can make you more sensitive to the sun. Keep out of the sun. If you cannot avoid being in the sun, wear protective clothing and use sunscreen. Do not use sun lamps or tanning beds/booths. What side effects may I notice from receiving this medication? Side effects that you should report to your doctor or health care professional as soon as possible: allergic reactions like skin rash, itching or hives, swelling of the face, lips, or tongue low blood counts - this medicine may decrease the number of white blood cells, red blood cells and platelets. You may be at increased risk for infections and bleeding. signs of infection - fever or chills, cough, sore throat, pain or difficulty passing urine signs of decreased platelets or bleeding - bruising, pinpoint red spots on the  skin, black, tarry stools, blood in the urine signs of decreased red blood cells - unusually weak or tired, fainting spells, lightheadedness breathing problems changes in vision chest pain mouth sores nausea and vomiting pain, swelling, redness at site where injected pain, tingling, numbness in the hands or feet redness, swelling, or sores on hands or feet stomach pain unusual bleeding Side effects that usually do not require medical attention (report to your doctor or health care professional if they continue or are bothersome): changes in finger or toe nails diarrhea dry or itchy skin hair loss headache loss of appetite sensitivity of eyes to the light stomach upset unusually teary eyes This list may not describe all  possible side effects. Call your doctor for medical advice about side effects. You may report side effects to FDA at 1-800-FDA-1088. Where should I keep my medication? This drug is given in a hospital or clinic and will not be stored at home. NOTE: This sheet is a summary. It may not cover all possible information. If you have questions about this medicine, talk to your doctor, pharmacist, or health care provider.  2023 Elsevier/Gold Standard (2021-02-06 00:00:00) The chemotherapy medication bag should finish at 46 hours, 96 hours, or 7 days. For example, if your pump is scheduled for 46 hours and it was put on at 4:00 p.m., it should finish at 2:00 p.m. the day it is scheduled to come off regardless of your appointment time.     Estimated time to finish at 11:45 a.m. on Thursday 10/15/2021.   If the display on your pump reads "Low Volume" and it is beeping, take the batteries out of the pump and come to the cancer center for it to be taken off.   If the pump alarms go off prior to the pump reading "Low Volume" then call [phone] and someone can assist you.  If the plunger comes out and the chemotherapy medication is leaking out, please use your home chemo  spill kit to clean up the spill. Do NOT use paper towels or other household products.  If you have problems or questions regarding your pump, please call either [phone] (24 hours a day) or the cancer center Monday-Friday 8:00 a.m.- 4:30 p.m. at the clinic number and we will assist you. If you are unable to get assistance, then go to the nearest Emergency Department and ask the staff to contact the IV team for assistance.

## 2021-10-14 ENCOUNTER — Other Ambulatory Visit: Payer: Self-pay

## 2021-10-14 LAB — CANCER ANTIGEN 19-9: CA 19-9: 7388 U/mL — ABNORMAL HIGH (ref 0–35)

## 2021-10-15 ENCOUNTER — Inpatient Hospital Stay: Payer: Medicare Other

## 2021-10-15 DIAGNOSIS — C250 Malignant neoplasm of head of pancreas: Secondary | ICD-10-CM

## 2021-10-15 DIAGNOSIS — Z5111 Encounter for antineoplastic chemotherapy: Secondary | ICD-10-CM | POA: Diagnosis not present

## 2021-10-15 MED ORDER — HEPARIN SOD (PORK) LOCK FLUSH 100 UNIT/ML IV SOLN
500.0000 [IU] | Freq: Once | INTRAVENOUS | Status: AC | PRN
  Administered 2021-10-15: 500 [IU]

## 2021-10-15 MED ORDER — SODIUM CHLORIDE 0.9% FLUSH
10.0000 mL | INTRAVENOUS | Status: DC | PRN
  Administered 2021-10-15: 10 mL

## 2021-10-15 NOTE — Patient Instructions (Signed)

## 2021-10-20 ENCOUNTER — Other Ambulatory Visit: Payer: Self-pay

## 2021-10-21 ENCOUNTER — Other Ambulatory Visit: Payer: Self-pay | Admitting: Nurse Practitioner

## 2021-10-21 ENCOUNTER — Encounter: Payer: Self-pay | Admitting: *Deleted

## 2021-10-21 DIAGNOSIS — C250 Malignant neoplasm of head of pancreas: Secondary | ICD-10-CM

## 2021-10-21 MED ORDER — OXYCODONE HCL 5 MG PO TABS: 5.0000 mg | ORAL_TABLET | ORAL | 0 refills | Status: DC | PRN

## 2021-10-25 ENCOUNTER — Other Ambulatory Visit: Payer: Self-pay | Admitting: Oncology

## 2021-10-26 ENCOUNTER — Inpatient Hospital Stay: Payer: Medicare Other

## 2021-10-26 ENCOUNTER — Telehealth: Payer: Self-pay

## 2021-10-26 ENCOUNTER — Inpatient Hospital Stay: Payer: Medicare Other | Admitting: Nurse Practitioner

## 2021-10-28 ENCOUNTER — Inpatient Hospital Stay: Payer: Medicare Other

## 2021-11-08 ENCOUNTER — Other Ambulatory Visit: Payer: Self-pay | Admitting: Oncology

## 2021-11-10 ENCOUNTER — Inpatient Hospital Stay: Payer: Medicare Other | Attending: Nurse Practitioner | Admitting: Oncology

## 2021-11-10 ENCOUNTER — Other Ambulatory Visit: Payer: Medicare Other

## 2021-11-10 ENCOUNTER — Ambulatory Visit: Payer: Medicare Other

## 2021-11-10 ENCOUNTER — Inpatient Hospital Stay: Payer: Medicare Other

## 2021-11-10 VITALS — BP 119/81 | HR 60 | Temp 97.9°F | Resp 18 | Ht 68.0 in | Wt 154.2 lb

## 2021-11-10 DIAGNOSIS — G620 Drug-induced polyneuropathy: Secondary | ICD-10-CM | POA: Diagnosis not present

## 2021-11-10 DIAGNOSIS — C250 Malignant neoplasm of head of pancreas: Secondary | ICD-10-CM

## 2021-11-10 DIAGNOSIS — Z95828 Presence of other vascular implants and grafts: Secondary | ICD-10-CM

## 2021-11-10 LAB — CMP (CANCER CENTER ONLY)
ALT: 29 U/L (ref 0–44)
AST: 29 U/L (ref 15–41)
Albumin: 4.1 g/dL (ref 3.5–5.0)
Alkaline Phosphatase: 109 U/L (ref 38–126)
Anion gap: 7 (ref 5–15)
BUN: 14 mg/dL (ref 8–23)
CO2: 26 mmol/L (ref 22–32)
Calcium: 8.9 mg/dL (ref 8.9–10.3)
Chloride: 104 mmol/L (ref 98–111)
Creatinine: 0.96 mg/dL (ref 0.61–1.24)
GFR, Estimated: >60 mL/min (ref >60)
Glucose, Bld: 173 mg/dL — ABNORMAL HIGH (ref 70–99)
Potassium: 3.8 mmol/L (ref 3.5–5.1)
Sodium: 137 mmol/L (ref 135–145)
Total Bilirubin: 0.5 mg/dL (ref 0.3–1.2)
Total Protein: 6.6 g/dL (ref 6.5–8.1)

## 2021-11-10 LAB — CBC WITH DIFFERENTIAL (CANCER CENTER ONLY)
Abs Immature Granulocytes: 0.04 10*3/uL (ref 0.00–0.07)
Basophils Absolute: 0.1 10*3/uL (ref 0.0–0.1)
Basophils Relative: 1 %
Eosinophils Absolute: 0.4 10*3/uL (ref 0.0–0.5)
Eosinophils Relative: 5 %
HCT: 35.3 % — ABNORMAL LOW (ref 39.0–52.0)
Hemoglobin: 11.6 g/dL — ABNORMAL LOW (ref 13.0–17.0)
Immature Granulocytes: 1 %
Lymphocytes Relative: 32 %
Lymphs Abs: 2.4 10*3/uL (ref 0.7–4.0)
MCH: 30.1 pg (ref 26.0–34.0)
MCHC: 32.9 g/dL (ref 30.0–36.0)
MCV: 91.5 fL (ref 80.0–100.0)
Monocytes Absolute: 0.8 10*3/uL (ref 0.1–1.0)
Monocytes Relative: 10 %
Neutro Abs: 3.9 10*3/uL (ref 1.7–7.7)
Neutrophils Relative %: 51 %
Platelet Count: 210 10*3/uL (ref 150–400)
RBC: 3.86 MIL/uL — ABNORMAL LOW (ref 4.22–5.81)
RDW: 16.4 % — ABNORMAL HIGH (ref 11.5–15.5)
WBC Count: 7.6 10*3/uL (ref 4.0–10.5)
nRBC: 0 % (ref 0.0–0.2)

## 2021-11-10 MED ORDER — HEPARIN SOD (PORK) LOCK FLUSH 100 UNIT/ML IV SOLN
500.0000 [IU] | Freq: Once | INTRAVENOUS | Status: AC
  Administered 2021-11-10: 500 [IU] via INTRAVENOUS

## 2021-11-10 MED ORDER — OXYCODONE HCL 5 MG PO TABS: 5.0000 mg | ORAL_TABLET | ORAL | 0 refills | Status: DC | PRN

## 2021-11-10 MED ORDER — SODIUM CHLORIDE 0.9% FLUSH
10.0000 mL | INTRAVENOUS | Status: DC | PRN
  Administered 2021-11-10: 10 mL via INTRAVENOUS

## 2021-11-10 NOTE — Patient Instructions (Signed)

## 2021-11-10 NOTE — Progress Notes (Signed)
[place] OFFICE PROGRESS NOTE   Diagnosis: Pancreas cancer  INTERVAL HISTORY:   Francisco Frank completed another cycle of 5-FU/liposomal irinotecan on 10/13/2021.  He reports severe malaise for several days following chemotherapy.  No diarrhea or mouth sores.  Mild nausea.  He had an increase in abdominal pain following the last cycle of chemotherapy.  He was taking up to 4 oxycodone tablets per day for relief of pain.  The pain has improved.  He is now taking 2 oxycodone tablets per day.  He takes tramadol occasionally.  He has persistent peripheral numbness.  Francisco Frank has decided against further chemotherapy.  Objective:  Vital signs in last 24 hours:  Blood pressure 119/81, pulse 60, temperature 97.9 F (36.6 C), temperature source Oral, resp. rate 18, height '5\' 8"'$  (1.727 m), weight 154 lb 3.2 oz (69.9 kg), SpO2 100 %.    HEENT: No thrush or ulcers Resp: Lungs clear bilaterally Cardio: Regular rate and rhythm GI: No hepatosplenomegaly, no mass, mild tenderness in the lateral left upper abdomen Vascular: No leg edema   Portacath/PICC-without erythema  Lab Results:  Lab Results  Component Value Date   WBC 6.9 10/13/2021   HGB 10.6 (L) 10/13/2021   HCT 32.3 (L) 10/13/2021   MCV 90.2 10/13/2021   PLT 193 10/13/2021   NEUTROABS 3.6 10/13/2021    CMP  Lab Results  Component Value Date   NA 142 10/13/2021   K 3.5 10/13/2021   CL 107 10/13/2021   CO2 24 10/13/2021   GLUCOSE 199 (H) 10/13/2021   BUN 10 10/13/2021   CREATININE 0.98 10/13/2021   CALCIUM 9.2 10/13/2021   PROT 6.2 (L) 10/13/2021   ALBUMIN 4.0 10/13/2021   AST 22 10/13/2021   ALT 24 10/13/2021   ALKPHOS 121 10/13/2021   BILITOT 0.4 10/13/2021   GFRNONAA >60 10/13/2021    Lab Results  Component Value Date   MWN027 7,388 (H) 10/13/2021     Medications: I have reviewed the patient's current medications.   Assessment/Plan: Pancreas cancer Outside CT-apparent pancreas abnormality  (we do not have a copy of the report) MRI abdomen 11/06/2020-pancreatic body and tail atrophy with duct dilatation up to 9 mm.  Both ducts undergo an abrupt transition in the region of the pancreatic head.  Non border deforming pancreatic head mass measuring 3.2 x 2.6 cm.  No arterial involvement by tumor.  SMV adjacent to presumed tumor without encasement.  Portal vein uninvolved.  No abdominal adenopathy.  Left periaortic 7 mm node not pathologic by size criteria.  No ascites.  Subtle left-sided pericolonic nodule 5 mm. EUS/ERCP 11/11/2020-EGD showed gastritis which was biopsied, mucosal changes in the duodenum, mucosal changes in the duodenum sweep (gastric antral and oxyntic mucosa with no specific histopathologic changes; Warthin Starry stain negative for H pylori).  EUS showed an irregular mass in the pancreatic head measuring 26 mm x 30 mm; the outer margins were irregular.  There was sonographic evidence suggesting invasion into the portal vein (manifested by abutment) and the superior mesenteric vein (manifested by abutment); an intact interface was seen between the mass and the superior mesenteric artery and celiac trunk suggesting a lack of invasion.  Endosonographic imaging in the visualized portion of the liver showed no mass.  There was dilatation of the common bile duct and the common hepatic duct.  No malignant appearing lymph nodes were visualized in the celiac region, peripancreatic region, porta hepatis region.  FNA biopsy of the pancreas head mass showed malignant  cells consistent with adenocarcinoma.  Biliary access could not be obtained.  Cholangiogram 11/12/2020-severe intrahepatic biliary dilatation.  A peripheral right hepatic bile duct was successfully cannulated.  Internal/external biliary drain placed.  Cytology on biliary drain bile showed malignant cells consistent with adenocarcinoma.   CT chest 11/13/2020-no evidence of metastatic disease.   CT pelvis 11/13/2020-nonspecific  circumferential wall thickening of the cecum measuring up to 12 mm in thickness.  Otherwise no evidence of intrapelvic metastases.   11/19/2020-biliary stent placement, biliary drain exchange 11/28/2020-exchange of internal/external biliary drain secondary to persistent jaundice CT abdomen/pelvis 11/28/2020-pancreas head mass, 3.5 x 2.8 cm, no encasement of the celiac trunk or SMA, partial encasement of the portal venous confluence-at least 180 degrees, partial encasement of the SMV-approximately 180 degrees, ventral extension of tumor abuts the posterior duodenum, no adenopathy, percutaneous biliary stent with decompression of previous dilated intrahepatic and common bile ducts Cycle 1 FOLFIRINOX 01/01/2021, irinotecan held Cycle 2 FOLFIRINOX 01/13/2021, irinotecan held Cycle 3 FOLFIRINOX 01/28/2021 Cycle 4 FOLFIRINOX 02/09/2021 Cycle 5 FOLFIRINOX 03/03/2021, Ziextenzo Cycle 6 FOLFIRINOX 03/25/2021, Ziextenzo CT abdomen/pelvis 04/06/2021-no change in pancreas head mass with partial encasement of the portal venous confluence, nonocclusive SMV thrombus, borderline upper abdominal lymph nodes-unchanged, no evidence of liver metastases Cycle 7 FOLFIRINOX 04/08/2021,Ziextenzo Cycle 8 FOLFIRINOX 04/22/2021, Ziextenzo 05/01/2021-multidisciplinary consultation at [facility], changed to gemcitabine/Abraxane recommended Cycle 1 gemcitabine/Abraxane 05/06/2021 Cycle 2 gemcitabine/Abraxane 05/22/2021, gemcitabine dose reduced secondary to thrombocytopenia Cycle 3 gemcitabine/Abraxane 06/05/2021 Cycle 4 gemcitabine/Abraxane 06/19/2021 07/03/2021 CTs at [facility]-ill-defined hypoenhancing pancreatic head mass measures 2.3 cm.  Focal abutment and distortion/invasion of SMV with slightly increased nonocclusive thrombus within the portal vein and upper SMV.  Similar indeterminate retroperitoneal lymph nodes.  Mild central intrahepatic biliary dilatation with loss of previously seen pneumobilia. Dr. Trey Paula recommendation is for another  month of chemotherapy and then restaging prior to surgery Cycle 5 gemcitabine 07/13/2021, Abraxane held due to neuropathy Cycle 6 gemcitabine 07/28/2021, Abraxane held due to neuropathy Cycle 7 gemcitabine/Abraxane 08/13/2021 CT[facility] at [facility] 08/21/2021-similar nonocclusive thrombus in the portal confluence extending into the SMV, increased intrahepatic biliary ductal dilatation, stable hypoenhancing pancreas head mass with short segment abutment of the portal vein/SMV, similar prominent retroperitoneal nodes Progressive elevation of CA 19-9 09/11/2021 Cycle 1 5-FU/liposomal irinotecan 09/29/2021 Cycle 2 5-FU/liposomal irinotecan 10/13/2021   Painless jaundice-internal/external biliary drain placed 11/12/2020.  Biliary stent placed and drain exchange 11/19/2020, exchange of internal/external drain 11/28/2020 09/01/2021-ERCP, repositioning of biliary stent, placement of a second covered metal stent in the common bile duct Weight loss Diabetes diagnosed June 2022 Change in bowel habits February 2022 Colonoscopy 08/06/2020-8 mm polyp in the sigmoid colon, a few small and large mouth diverticula in the sigmoid colon, normal mucosa found in the entire colon with biopsies for histology taken with a cold forceps from the right colon and left colon for evaluation of microscopic colitis (right colon-colonic mucosa with no significant pathologic findings; left colon-colonic mucosa with no significant pathologic findings; sigmoid polyp-tubular adenoma, negative for high-grade dysplasia). 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  He is not a candidate based on a recent rise in the CA 19-9 i and imaging.  He completed 2 cycles of salvage therapy with 5-FU/liposomal irinotecan.  He does  not wish to receive further chemotherapy.  He has persistent neuropathy symptoms.  I discussed the indication for palliative  radiation.  He understands the goal of radiation would be to improve pain and for local disease control.  He has no evidence of systemic disease at present.  He does not wish to receive radiation at present.  He would like to continue the current pain regimen.  He agrees to a follow-up visit and Port-A-Cath flush in 6 weeks.  We discussed hospice care.  He does not wish to enroll in hospice for now.  I refilled his prescription for oxycodone.   Betsy Coder, MD  11/10/2021  10:47 AM

## 2021-11-11 ENCOUNTER — Telehealth: Payer: Self-pay

## 2021-11-11 LAB — CANCER ANTIGEN 19-9: CA 19-9: 5537 U/mL — ABNORMAL HIGH (ref 0–35)

## 2021-11-11 NOTE — Telephone Encounter (Signed)
TC to Pt relayed CA-19-9 results. Pt aware. Pt will think about radiation. Pt verbalized understanding.

## 2021-11-11 NOTE — Telephone Encounter (Signed)
-----   Message from Ladell Pier, MD sent at 11/11/2021  3:14 PM EDT ----- Please call patient, the CA 19-9 is mildly improved, could be related to the recent treatment  I recommend he consider a consult with radiation oncology to discuss radiation options for treating the localized pancreas mass

## 2021-11-24 ENCOUNTER — Other Ambulatory Visit: Payer: Medicare Other

## 2021-11-24 ENCOUNTER — Ambulatory Visit: Payer: Medicare Other | Admitting: Nurse Practitioner

## 2021-11-24 ENCOUNTER — Ambulatory Visit: Payer: Medicare Other

## 2021-12-23 ENCOUNTER — Observation Stay (HOSPITAL_COMMUNITY): Payer: Medicare Other

## 2021-12-23 ENCOUNTER — Inpatient Hospital Stay: Payer: Medicare Other

## 2021-12-23 ENCOUNTER — Telehealth: Payer: Self-pay | Admitting: *Deleted

## 2021-12-23 ENCOUNTER — Encounter (HOSPITAL_COMMUNITY): Payer: Self-pay | Admitting: Student

## 2021-12-23 ENCOUNTER — Other Ambulatory Visit: Payer: Self-pay

## 2021-12-23 ENCOUNTER — Observation Stay (HOSPITAL_COMMUNITY)
Admission: EM | Admit: 2021-12-23 | Discharge: 2021-12-24 | Disposition: A | Discharge status: Home / Self Care | Payer: Medicare Other | Source: Ambulatory Visit | Attending: Student | Admitting: Student

## 2021-12-23 ENCOUNTER — Encounter (HOSPITAL_COMMUNITY): Payer: Self-pay

## 2021-12-23 ENCOUNTER — Inpatient Hospital Stay: Payer: Medicare Other | Attending: Nurse Practitioner | Admitting: Oncology

## 2021-12-23 DIAGNOSIS — E1165 Type 2 diabetes mellitus with hyperglycemia: Secondary | ICD-10-CM | POA: Insufficient documentation

## 2021-12-23 DIAGNOSIS — I81 Portal vein thrombosis: Secondary | ICD-10-CM

## 2021-12-23 DIAGNOSIS — M549 Dorsalgia, unspecified: Secondary | ICD-10-CM | POA: Insufficient documentation

## 2021-12-23 DIAGNOSIS — Z952 Presence of prosthetic heart valve: Secondary | ICD-10-CM | POA: Diagnosis not present

## 2021-12-23 DIAGNOSIS — Z86718 Personal history of other venous thrombosis and embolism: Secondary | ICD-10-CM | POA: Insufficient documentation

## 2021-12-23 DIAGNOSIS — Z95828 Presence of other vascular implants and grafts: Secondary | ICD-10-CM

## 2021-12-23 DIAGNOSIS — C250 Malignant neoplasm of head of pancreas: Secondary | ICD-10-CM | POA: Diagnosis not present

## 2021-12-23 DIAGNOSIS — I10 Essential (primary) hypertension: Secondary | ICD-10-CM | POA: Insufficient documentation

## 2021-12-23 DIAGNOSIS — R748 Abnormal levels of other serum enzymes: Secondary | ICD-10-CM | POA: Diagnosis not present

## 2021-12-23 DIAGNOSIS — Z7902 Long term (current) use of antithrombotics/antiplatelets: Secondary | ICD-10-CM | POA: Insufficient documentation

## 2021-12-23 DIAGNOSIS — Z7901 Long term (current) use of anticoagulants: Secondary | ICD-10-CM | POA: Diagnosis not present

## 2021-12-23 DIAGNOSIS — Z79899 Other long term (current) drug therapy: Secondary | ICD-10-CM | POA: Insufficient documentation

## 2021-12-23 DIAGNOSIS — R109 Unspecified abdominal pain: Secondary | ICD-10-CM | POA: Insufficient documentation

## 2021-12-23 DIAGNOSIS — I2510 Atherosclerotic heart disease of native coronary artery without angina pectoris: Secondary | ICD-10-CM | POA: Diagnosis not present

## 2021-12-23 DIAGNOSIS — Z8507 Personal history of malignant neoplasm of pancreas: Secondary | ICD-10-CM | POA: Insufficient documentation

## 2021-12-23 DIAGNOSIS — Z794 Long term (current) use of insulin: Secondary | ICD-10-CM | POA: Diagnosis not present

## 2021-12-23 DIAGNOSIS — C259 Malignant neoplasm of pancreas, unspecified: Secondary | ICD-10-CM | POA: Diagnosis present

## 2021-12-23 LAB — CBC WITH DIFFERENTIAL/PLATELET
Abs Immature Granulocytes: 0.02 10*3/uL (ref 0.00–0.07)
Basophils Absolute: 0.1 10*3/uL (ref 0.0–0.1)
Basophils Relative: 1 %
Eosinophils Absolute: 0.3 10*3/uL (ref 0.0–0.5)
Eosinophils Relative: 4 %
HCT: 33 % — ABNORMAL LOW (ref 39.0–52.0)
Hemoglobin: 10.7 g/dL — ABNORMAL LOW (ref 13.0–17.0)
Immature Granulocytes: 0 %
Lymphocytes Relative: 31 %
Lymphs Abs: 2.2 10*3/uL (ref 0.7–4.0)
MCH: 30 pg (ref 26.0–34.0)
MCHC: 32.4 g/dL (ref 30.0–36.0)
MCV: 92.4 fL (ref 80.0–100.0)
Monocytes Absolute: 0.9 10*3/uL (ref 0.1–1.0)
Monocytes Relative: 13 %
Neutro Abs: 3.6 10*3/uL (ref 1.7–7.7)
Neutrophils Relative %: 51 %
Platelets: 169 10*3/uL (ref 150–400)
RBC: 3.57 MIL/uL — ABNORMAL LOW (ref 4.22–5.81)
RDW: 14.6 % (ref 11.5–15.5)
WBC: 7 10*3/uL (ref 4.0–10.5)
nRBC: 0 % (ref 0.0–0.2)

## 2021-12-23 LAB — CMP (CANCER CENTER ONLY)
ALT: 230 U/L — ABNORMAL HIGH (ref 0–44)
AST: 250 U/L (ref 15–41)
Albumin: 4 g/dL (ref 3.5–5.0)
Alkaline Phosphatase: 1113 U/L — ABNORMAL HIGH (ref 38–126)
Anion gap: 9 (ref 5–15)
BUN: 10 mg/dL (ref 8–23)
CO2: 26 mmol/L (ref 22–32)
Calcium: 9.1 mg/dL (ref 8.9–10.3)
Chloride: 100 mmol/L (ref 98–111)
Creatinine: 1 mg/dL (ref 0.61–1.24)
GFR, Estimated: >60 mL/min (ref >60)
Glucose, Bld: 168 mg/dL — ABNORMAL HIGH (ref 70–99)
Potassium: 3.5 mmol/L (ref 3.5–5.1)
Sodium: 135 mmol/L (ref 135–145)
Total Bilirubin: 1.6 mg/dL — ABNORMAL HIGH (ref 0.3–1.2)
Total Protein: 6.7 g/dL (ref 6.5–8.1)

## 2021-12-23 LAB — GLUCOSE, CAPILLARY: Glucose-Capillary: 98 mg/dL (ref 70–99)

## 2021-12-23 LAB — CK: Total CK: 64 U/L (ref 49–397)

## 2021-12-23 LAB — HEMOGLOBIN A1C
Hgb A1c MFr Bld: 8 % — ABNORMAL HIGH (ref 4.8–5.6)
Mean Plasma Glucose: 182.9 mg/dL

## 2021-12-23 LAB — LIPASE, BLOOD: Lipase: 19 U/L (ref 11–51)

## 2021-12-23 MED ORDER — OXYCODONE HCL 5 MG PO TABS: 5.0000 mg | ORAL_TABLET | Freq: Four times a day (QID) | ORAL | Status: DC | PRN

## 2021-12-23 MED ORDER — IOHEXOL 300 MG/ML  SOLN
100.0000 mL | Freq: Once | INTRAMUSCULAR | Status: AC | PRN
  Administered 2021-12-23: 100 mL via INTRAVENOUS

## 2021-12-23 MED ORDER — HEPARIN SOD (PORK) LOCK FLUSH 100 UNIT/ML IV SOLN
500.0000 [IU] | Freq: Once | INTRAVENOUS | Status: AC
  Administered 2021-12-23: 500 [IU] via INTRAVENOUS

## 2021-12-23 MED ORDER — HEPARIN (PORCINE) 25000 UT/250ML-% IV SOLN
1200.0000 [IU]/h | INTRAVENOUS | Status: AC
  Administered 2021-12-24: 1200 [IU]/h via INTRAVENOUS
  Filled 2021-12-23: qty 250

## 2021-12-23 MED ORDER — HYDROMORPHONE HCL 4 MG PO TABS: 4.0000 mg | ORAL_TABLET | ORAL | 0 refills | Status: DC | PRN

## 2021-12-23 MED ORDER — SODIUM CHLORIDE 0.9% FLUSH
10.0000 mL | INTRAVENOUS | Status: DC | PRN
  Administered 2021-12-24: 10 mL

## 2021-12-23 MED ORDER — SODIUM CHLORIDE 0.9% FLUSH
10.0000 mL | Freq: Once | INTRAVENOUS | Status: AC
  Administered 2021-12-23: 10 mL via INTRAVENOUS

## 2021-12-23 MED ORDER — HYOSCYAMINE SULFATE 0.125 MG PO TABS: 0.1250 mg | ORAL_TABLET | Freq: Four times a day (QID) | ORAL | 1 refills | Status: DC | PRN

## 2021-12-23 MED ORDER — ONDANSETRON HCL 4 MG PO TABS: 4.0000 mg | ORAL_TABLET | Freq: Four times a day (QID) | ORAL | Status: DC | PRN

## 2021-12-23 MED ORDER — IOHEXOL 9 MG/ML PO SOLN
500.0000 mL | ORAL | Status: AC
  Administered 2021-12-23 (×2): 500 mL via ORAL

## 2021-12-23 MED ORDER — INSULIN ASPART 100 UNIT/ML IJ SOLN: 0.0000 [IU] | Freq: Every day | INTRAMUSCULAR | Status: DC

## 2021-12-23 MED ORDER — HYDROMORPHONE HCL 1 MG/ML IJ SOLN
0.5000 mg | INTRAMUSCULAR | Status: DC | PRN
  Administered 2021-12-24: 0.5 mg via INTRAVENOUS
  Filled 2021-12-23: qty 0.5

## 2021-12-23 MED ORDER — ONDANSETRON HCL 4 MG/2ML IJ SOLN
4.0000 mg | Freq: Four times a day (QID) | INTRAMUSCULAR | Status: DC | PRN
  Administered 2021-12-24: 4 mg via INTRAVENOUS
  Filled 2021-12-23: qty 2

## 2021-12-23 MED ORDER — IOHEXOL 9 MG/ML PO SOLN
ORAL | Status: AC
  Administered 2021-12-23: 500 mL
  Filled 2021-12-23: qty 1000

## 2021-12-23 MED ORDER — INSULIN ASPART 100 UNIT/ML IJ SOLN: 0.0000 [IU] | Freq: Three times a day (TID) | INTRAMUSCULAR | Status: DC

## 2021-12-23 MED ORDER — CHLORHEXIDINE GLUCONATE CLOTH 2 % EX PADS
6.0000 | MEDICATED_PAD | Freq: Every day | CUTANEOUS | Status: DC
  Administered 2021-12-23 – 2021-12-24 (×2): 6 via TOPICAL

## 2021-12-23 MED ORDER — SODIUM CHLORIDE 0.9% FLUSH
10.0000 mL | Freq: Two times a day (BID) | INTRAVENOUS | Status: DC
  Administered 2021-12-23 – 2021-12-24 (×2): 10 mL

## 2021-12-23 MED ORDER — ACETAMINOPHEN 650 MG RE SUPP: 650.0000 mg | Freq: Four times a day (QID) | RECTAL | Status: DC | PRN

## 2021-12-23 MED ORDER — TRAMADOL HCL 50 MG PO TABS
50.0000 mg | ORAL_TABLET | Freq: Two times a day (BID) | ORAL | Status: DC | PRN
  Filled 2021-12-23: qty 1

## 2021-12-23 MED ORDER — ACETAMINOPHEN 325 MG PO TABS: 650.0000 mg | ORAL_TABLET | Freq: Four times a day (QID) | ORAL | Status: DC | PRN

## 2021-12-23 NOTE — Progress Notes (Signed)
ANTICOAGULATION CONSULT NOTE - Initial Consult  Pharmacy Consult for heparin Indication: portal vein thrombosis  Allergies  Allergen Reactions   Hydrocodone-Acetaminophen Itching    Patient Measurements: Height: '5\' 8"'$  (172.7 cm) Weight: 70.1 kg (154 lb 9.4 oz) IBW/kg (Calculated) : 68.4 Heparin Dosing Weight: 70.1kg  Vital Signs: Temp: 99.1 F (37.3 C) (10/04 2216) Temp Source: Oral (10/04 2216) BP: 104/75 (10/04 2216) Pulse Rate: 60 (10/04 2216)  Labs: Recent Labs    12/23/21 1009 12/23/21 1811  HGB  --  10.7*  HCT  --  33.0*  PLT  --  169  CREATININE 1.00  --   CKTOTAL  --  64    Estimated Creatinine Clearance: 64.6 mL/min (by C-G formula based on SCr of 1 mg/dL).   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 Pharmacy consulted to dose heparin drip for portal vein thrombosis.  LD of eliquis 10/3 @ 0000  Hgb 10.7, plts 169  Goal of Therapy:  aPTT 66-102 seconds Monitor platelets by anticoagulation protocol: Yes   Plan:  Start heparin drip at 1200 units/hr (no bolus) Turn off heparin at 0730 on 10/5 for ERCP per consult Daily CBC F/U [facility] post ERCP  Dolly Rias RPh 12/23/2021, 11:49 PM

## 2021-12-23 NOTE — Plan of Care (Signed)
       CROSS COVER NOTE    Raenette Rover, DNP, [place]   Notified by [facility] radiology of the following results:  CT abd and pelvis w/ contrast  IMPRESSION: 1. New thrombus within the portal confluence and portal veins. 2. 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 3. Stable pancreatic head mass with atrophy and ductal dilatation of the pancreatic body and tail. 4. Stable retroperitoneal and periportal lymphadenopathy. 5. Common bile duct stent in adequate position. Mild intrahepatic biliary ductal dilatation is slightly more prominent when compared to the prior study. 6. New nodular densities in the lung bases measuring up to 11 mm worrisome for metastatic disease.    This patient has a significant history of pancreatic cancer with biliary stents placed in June at [facility], and has been on Eliquis for portal thrombosis.  Patient's Eliquis has been held since admission due to ERCP procedure tomorrow.   on-call [facility] ([facility]) was consulted regarding new thrombus within the portal confluence and portal veins and the use of anticoagulation therapy.  No need for emergent intervention is needed at this moment as patient has remained stable throughout admission. Dr. Duanne Guess agrees with recommendation to place pt on heparin gtt for the night. The plan is to start patient on heparin drip and turn it off at 7:30 in the morning, at least 4 hours prior to ERCP procedure.  Patient has been made aware of CT results and agrees with current plan.   Dois Davenport, ACNP

## 2021-12-23 NOTE — Telephone Encounter (Signed)
Informed Francisco Frank that liver functions are very high and Francisco Frank spoke w/Francisco Frank (GI), who recommends admission to allow him to take to endo suite for stent exchange. If he declines admission, can order outpatient CT Abd/Pelvis w/contrast, but may still need to go to hospital if stent is blocked. Discussed with Francisco Frank and he agrees to admission. He will stay home and await call from bed control when bed is ready. Hospitalist, Francisco Frank paged to speak w/Francisco Frank. Med surg bed w/diagnosis of pancreas cancer, elevated liver functions and r/o stent blockage.

## 2021-12-23 NOTE — Patient Instructions (Signed)

## 2021-12-23 NOTE — H&P (Signed)
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  Patient saw his oncologist, Dr. Benay Spice earlier in the morning for routine follow-up and had CMP that showed elevated liver enzymes.  Dr. Benay Spice discussed case with [facility] and they recommended admission for ERCP and stent exchange.  Patient reports abdominal pain for about 2 weeks.  Pain is is mainly across upper abdomen.  He describes the pain as cramping.  Pain is constant.  Not necessarily associated with meals.  Worse over the last 3 days.  Alleviated by pain medication.  His pain does worsen this morning but improved after he went home and took pain medication, tramadol.  He rates the pain 5/10.  He has no fever, nausea and vomiting.  He has loose stool unchanged from baseline.  He denies new medications.  He denies runny nose, sore throat, chest pain, dyspnea, UTI or focal neuro symptoms. However, he admits to urine color change.  He states his urine is darker.  Denies stool color change.  Patient reports taking his morning medication including Eliquis and Plavix.   CMP from this morning significant for glucose 168, ALP 1113, AST 250, ALT 230, total bili 1.6.   Patient denies smoking cigarette, drinking alcohol recreational drug use.  Prefers to remain full code.  On arrival, vitals stable.  Appears well.   ROS All review of system negative except for pertinent positives and negatives as history of present illness above.  PMH Past Medical History:  Diagnosis Date   Arthritis    Back pain    Cancer ([place])    Diabetes mellitus without complication ([place])    Hearing  deficit    Hyperlipemia    Hypertension    Insomnia    Sleep apnea    PSH Past Surgical History:  Procedure Laterality Date   BIOPSY  11/11/2020   Procedure: BIOPSY;  Surgeon: Rush Landmark Telford Nab., MD;  Location: [facility];  Service: Gastroenterology;;   CARDIAC CATHETERIZATION     ENDOSCOPIC RETROGRADE CHOLANGIOPANCREATOGRAPHY (ERCP) WITH PROPOFOL N/A 11/11/2020   Procedure: ENDOSCOPIC RETROGRADE CHOLANGIOPANCREATOGRAPHY (ERCP) WITH PROPOFOL;  Surgeon: Irving Copas., MD;  Location: [facility];  Service: Gastroenterology;  Laterality: N/A;   ESOPHAGOGASTRODUODENOSCOPY (EGD) WITH PROPOFOL N/A 11/11/2020   Procedure: ESOPHAGOGASTRODUODENOSCOPY (EGD) WITH PROPOFOL;  Surgeon: Rush Landmark Telford Nab., MD;  Location: [facility];  Service: Gastroenterology;  Laterality: N/A;   EUS N/A 11/11/2020   Procedure: UPPER ENDOSCOPIC ULTRASOUND (EUS) RADIAL;  Surgeon: Irving Copas., MD;  Location: [facility];  Service: Gastroenterology;  Laterality: N/A;   FINE NEEDLE ASPIRATION  11/11/2020   Procedure: FINE NEEDLE ASPIRATION;  Surgeon: Rush Landmark Telford Nab., MD;  Location: [facility];  Service: Gastroenterology;;   HEMOSTASIS CONTROL  11/11/2020   Procedure: HEMOSTASIS CONTROL;  Surgeon: Irving Copas., MD;  Location: [facility];  Service: Gastroenterology;;  epi injection   IR BILIARY STENT(S) EXISTING ACCESS INC DILATION CATH EXCHANGE  11/19/2020   IR CHOLANGIOGRAM EXISTING TUBE  12/11/2020   IR CHOLANGIOGRAM EXISTING TUBE  01/22/2021   IR CONVERT BILIARY DRAIN TO INT EXT BILIARY DRAIN  11/28/2020   IR PERC CHOLECYSTOSTOMY  12/17/2020   IR  PERCUTANEOUS TRANSHEPATIC CHOLANGIOGRAM  11/12/2020   IR REMOVAL BILIARY DRAIN  02/18/2021   PORTACATH PLACEMENT N/A 12/31/2020   Procedure: INSERTION PORT-A-CATH;  Surgeon: Dwan Bolt, MD;  Location: [facility];  Service: General;  Laterality: N/A;   REPLACEMENT TOTAL KNEE     ROTATOR CUFF REPAIR     SPHINCTEROTOMY  11/11/2020    Procedure: SPHINCTEROTOMY;  Surgeon: Mansouraty, Telford Nab., MD;  Location: [facility];  Service: Gastroenterology;;   [place] Guernsey Family History  Problem Relation Age of Onset   Leukemia Mother        dx 47s   Cirrhosis Father    Alcohol abuse Father    Colon polyps Maternal Grandmother        unknown #   Heart disease Paternal Grandfather    Prostate cancer Paternal Grandfather        dx after 33   Esophageal cancer Neg Hx    Pancreatic cancer Neg Hx    Stomach cancer Neg Hx     Social Hx  reports that he has never smoked. His smokeless tobacco use includes snuff. He reports that he does not currently use alcohol. He reports that he does not use drugs.  Allergy Allergies  Allergen Reactions   Hydrocodone-Acetaminophen Itching   Home Meds Prior to Admission medications   Medication Sig Start Date End Date Taking? Authorizing Provider  atenolol (TENORMIN) 50 MG tablet Take 50 mg by mouth daily. 01/01/21   [provider]  atorvastatin (LIPITOR) 40 MG tablet Take 40 mg by mouth daily. 11/16/20   [provider]  BD PEN NEEDLE NANO 2ND GEN 32G X 4 MM MISC  11/17/20   [provider]  clopidogrel (PLAVIX) 75 MG tablet Take 75 mg by mouth daily. 01/08/21   [provider]  diphenhydramine-acetaminophen (TYLENOL PM) 25-500 MG TABS tablet Take 1 tablet by mouth at bedtime as needed. 11/26/20   Ladell Pier, MD  ELIQUIS 5 MG TABS tablet TAKE 1 TABLET BY MOUTH TWICE A DAY 06/24/21   Ladell Pier, MD  HYDROmorphone (DILAUDID) 4 MG tablet Take 1-2 tablets (4-8 mg total) by mouth every 4 (four) hours as needed for severe pain. 12/23/21   Ladell Pier, MD  hyoscyamine (LEVSIN) 0.125 MG tablet Take 1 tablet (0.125 mg total) by mouth every 6 (six) hours as needed for cramping. 12/23/21   Ladell Pier, MD  Insulin Glargine (BASAGLAR KWIKPEN) 100 UNIT/ML Inject 7 Units into the skin at bedtime. 12/23/20   Eugenie Filler, MD  KLOR-CON M20 20 MEQ  tablet TAKE 1 TABLET BY MOUTH EVERY DAY Patient not taking: Reported on 11/10/2021 07/24/21   Owens Shark, NP  loperamide (IMODIUM) 2 MG capsule Take 1 capsule (2 mg total) by mouth as needed for diarrhea or loose stools. Patient not taking: Reported on 10/13/2021 12/23/20   Eugenie Filler, MD  NOVOLOG FLEXPEN 100 UNIT/ML FlexPen Inject 2-10 Units into the skin 3 (three) times daily as needed for high blood sugar. If BS is 150-200=2 units, 201-250=4 units, 251-300=6 units, 301-350=8 units, 351-400=10 units. 09/18/20   [provider]  prochlorperazine (COMPAZINE) 10 MG tablet Take 1 tablet (10 mg total) by mouth every 6 (six) hours as needed for nausea or vomiting. Patient not taking: Reported on 10/13/2021 12/23/20   Eugenie Filler, MD  traMADol (ULTRAM) 50 MG tablet Take 50 mg by mouth daily. 06/11/20   [provider]    Physical Exam: There were no  vitals filed for this visit.  GENERAL: No acute distress.  Appears well.  HEENT: MMM.  Vision and hearing grossly intact.  NECK: Supple.  No apparent JVD.  RESP:  No IWOB. Good air movement bilaterally. CVS:  RRR. Heart sounds normal.  ABD/GI/GU: Bowel sounds present. Soft. Non tender.  MSK/EXT:  Moves extremities. No apparent deformity or edema.  SKIN: no apparent skin lesion or wound NEURO: Awake, alert and oriented appropriately.  No gross deficit.  PSYCH: Calm. Normal affect.   Personally Reviewed Radiological Exams See HPI   Personally Reviewed Labs: CBC: No results for input(s): "WBC", "NEUTROABS", "HGB", "HCT", "MCV", "PLT" in the last 168 hours. Basic Metabolic Panel: Recent Labs  Lab 12/23/21 1009  NA 135  K 3.5  CL 100  CO2 26  GLUCOSE 168*  BUN 10  CREATININE 1.00  CALCIUM 9.1   GFR: Estimated Creatinine Clearance: 64.6 mL/min (by C-G formula based on SCr of 1 mg/dL). Liver Function Tests: Recent Labs  Lab 12/23/21 1009  AST 250*  ALT 230*  ALKPHOS 1,113*  BILITOT 1.6*  PROT 6.7   ALBUMIN 4.0   No results for input(s): "LIPASE", "AMYLASE" in the last 168 hours. No results for input(s): "AMMONIA" in the last 168 hours. Coagulation Profile: No results for input(s): "INR", "PROTIME" in the last 168 hours. Cardiac Enzymes: No results for input(s): "CKTOTAL", "CKMB", "CKMBINDEX", "TROPONINI" in the last 168 hours. BNP (last 3 results) No results for input(s): "PROBNP" in the last 8760 hours. HbA1C: No results for input(s): "HGBA1C" in the last 72 hours. CBG: No results for input(s): "GLUCAP" in the last 168 hours. Lipid Profile: No results for input(s): "CHOL", "HDL", "LDLCALC", "TRIG", "CHOLHDL", "LDLDIRECT" in the last 72 hours. Thyroid Function Tests: No results for input(s): "TSH", "T4TOTAL", "FREET4", "T3FREE", "THYROIDAB" in the last 72 hours. Anemia Panel: No results for input(s): "VITAMINB12", "FOLATE", "FERRITIN", "TIBC", "IRON", "RETICCTPCT" in the last 72 hours. Urine analysis:    Component Value Date/Time   COLORURINE YELLOW 12/12/2020 0714   APPEARANCEUR HAZY (A) 12/12/2020 0714   LABSPEC 1.030 12/12/2020 0714   PHURINE 6.0 12/12/2020 0714   GLUCOSEU NEGATIVE 12/12/2020 0714   HGBUR NEGATIVE 12/12/2020 0714   BILIRUBINUR MODERATE (A) 12/12/2020 0714   [place] 12/12/2020 0714   PROTEINUR 100 (A) 12/12/2020 0714   NITRITE NEGATIVE 12/12/2020 0714   LEUKOCYTESUR NEGATIVE 12/12/2020 0714     Assessment and plan: Principal Problem:   Elevated liver enzymes Active Problems:   Essential hypertension   Controlled type 2 diabetes mellitus without complication, without long-term current use of insulin (HCC)   Coronary artery disease involving native coronary artery of native heart without angina pectoris   Pancreatic cancer (HCC)  Elevated liver enzymes and patient with biliary obstruction due to pancreatic cancer: Patient had a stent and the stent at the [facility] in 08/2021.  Has lingering abdominal pain for 2 weeks that has gotten worse  over the last 3 days.  No associated nausea, vomiting or fever.  Patient is on Plavix and aspirin.  Last dose this morning. -Check lipase and CK -GI and oncology recommended CT abdomen and pelvis-ordered. -Check CBC with differential -Hold statin. -Clear liquid diet and n.p.o. after midnight -As needed Tylenol, tramadol and Dilaudid based on pain severity -As needed Zofran as needed for nausea.  Has not been an issue so far.  Abdominal pain: Likely due to the above. 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  Last dose this morning. -Hold  Eliquis in anticipation for ERCP  Uncontrolled IDDM-2 with hyperglycemia: A1c 9.6% on 11/09/2020.  On Lantus and short acting insulin. -SSI-sensitive -Hold basal insulin. -Check hemoglobin A1c -Hold statin.  History of CAD: No cardiopulmonary symptoms. -Continue home atenolol  -Hold statin due to LFT -Hold Plavix in case he has to go for ERCP.  Essential hypertension: Normotensive. -Continue home atenolol  Goal of care counseling: Prefers to remain full code.  DVT prophylaxis: SCD  Code Status: Full code Family Communication: None at bedside.  Consults called: Oncology and GI Admission status: Observation   Mercy Riding MD [facility]  If 7PM-7AM, please contact night-coverage [email or web address]  12/23/2021, 5:40 PM

## 2021-12-23 NOTE — Progress Notes (Signed)
CRITICAL VALUE STICKER  CRITICAL VALUE: AST 250  RECEIVER (on-site recipient of call): Ashantia Amaral,RN  DATE & TIME NOTIFIED: 12/23/21 @ 1110  MESSENGER (representative from lab):Otila Kluver  MD NOTIFIED: Dr. Benay Spice  TIME OF NOTIFICATION:1110  RESPONSE:

## 2021-12-23 NOTE — Progress Notes (Signed)
[facility] OFFICE PROGRESS NOTE   Diagnosis: Pancreas cancer  INTERVAL HISTORY:   Francisco Frank returns as scheduled.  He reports increased abdomen pain.  He has developed low back pain.  Oxycodone does not relieve the pain.  The pain is worse in the morning.  No difficulty with bowel or bladder function.  Good appetite.  Objective:  Vital signs in last 24 hours:  Blood pressure 136/82, pulse 62, temperature 98.1 F (36.7 C), temperature source Oral, resp. rate 18, height '5\' 8"'$  (1.727 m), weight 154 lb 9.6 oz (70.1 kg), SpO2 100 %.    Resp: Lungs clear bilaterally Cardio: Regular rate and rhythm GI: Mild tenderness in the lateral right upper abdomen, no mass, no hepatosplenomegaly Vascular: No leg edema Musculoskeletal: No spine tenderness  Portacath/PICC-without erythema  Lab Results:  Lab Results  Component Value Date   WBC 7.6 11/10/2021   HGB 11.6 (L) 11/10/2021   HCT 35.3 (L) 11/10/2021   MCV 91.5 11/10/2021   PLT 210 11/10/2021   NEUTROABS 3.9 11/10/2021    CMP  Lab Results  Component Value Date   NA 135 12/23/2021   K 3.5 12/23/2021   CL 100 12/23/2021   CO2 26 12/23/2021   GLUCOSE 168 (H) 12/23/2021   BUN 10 12/23/2021   CREATININE 1.00 12/23/2021   CALCIUM 9.1 12/23/2021   PROT 6.7 12/23/2021   ALBUMIN 4.0 12/23/2021   AST 250 (HH) 12/23/2021   ALT 230 (H) 12/23/2021   ALKPHOS 1,113 (H) 12/23/2021   BILITOT 1.6 (H) 12/23/2021   GFRNONAA >60 12/23/2021    Lab Results  Component Value Date   IRC789 5,537 (H) 11/10/2021     Medications: I have reviewed the patient's current medications.   Assessment/Plan: Pancreas cancer Outside CT-apparent pancreas abnormality (we do not have a copy of the report) MRI abdomen 11/06/2020-pancreatic body and tail atrophy with duct dilatation up to 9 mm.  Both ducts undergo an abrupt transition in the region of the pancreatic head.  Non border deforming pancreatic head mass measuring 3.2 x 2.6 cm.   No arterial involvement by tumor.  SMV adjacent to presumed tumor without encasement.  Portal vein uninvolved.  No abdominal adenopathy.  Left periaortic 7 mm node not pathologic by size criteria.  No ascites.  Subtle left-sided pericolonic nodule 5 mm. EUS/ERCP 11/11/2020-EGD showed gastritis which was biopsied, mucosal changes in the duodenum, mucosal changes in the duodenum sweep (gastric antral and oxyntic mucosa with no specific histopathologic changes; Warthin Starry stain negative for H pylori).  EUS showed an irregular mass in the pancreatic head measuring 26 mm x 30 mm; the outer margins were irregular.  There was sonographic evidence suggesting invasion into the portal vein (manifested by abutment) and the superior mesenteric vein (manifested by abutment); an intact interface was seen between the mass and the superior mesenteric artery and celiac trunk suggesting a lack of invasion.  Endosonographic imaging in the visualized portion of the liver showed no mass.  There was dilatation of the common bile duct and the common hepatic duct.  No malignant appearing lymph nodes were visualized in the celiac region, peripancreatic region, porta hepatis region.  FNA biopsy of the pancreas head mass showed malignant cells consistent with adenocarcinoma.  Biliary access could not be obtained.  Cholangiogram 11/12/2020-severe intrahepatic biliary dilatation.  A peripheral right hepatic bile duct was successfully cannulated.  Internal/external biliary drain placed.  Cytology on biliary drain bile showed malignant cells consistent with adenocarcinoma.   CT  chest 11/13/2020-no evidence of metastatic disease.   CT pelvis 11/13/2020-nonspecific circumferential wall thickening of the cecum measuring up to 12 mm in thickness.  Otherwise no evidence of intrapelvic metastases.   11/19/2020-biliary stent placement, biliary drain exchange 11/28/2020-exchange of internal/external biliary drain secondary to persistent jaundice CT  abdomen/pelvis 11/28/2020-pancreas head mass, 3.5 x 2.8 cm, no encasement of the celiac trunk or SMA, partial encasement of the portal venous confluence-at least 180 degrees, partial encasement of the SMV-approximately 180 degrees, ventral extension of tumor abuts the posterior duodenum, no adenopathy, percutaneous biliary stent with decompression of previous dilated intrahepatic and common bile ducts Cycle 1 FOLFIRINOX 01/01/2021, irinotecan held Cycle 2 FOLFIRINOX 01/13/2021, irinotecan held Cycle 3 FOLFIRINOX 01/28/2021 Cycle 4 FOLFIRINOX 02/09/2021 Cycle 5 FOLFIRINOX 03/03/2021, Ziextenzo Cycle 6 FOLFIRINOX 03/25/2021, Ziextenzo CT abdomen/pelvis 04/06/2021-no change in pancreas head mass with partial encasement of the portal venous confluence, nonocclusive SMV thrombus, borderline upper abdominal lymph nodes-unchanged, no evidence of liver metastases Cycle 7 FOLFIRINOX 04/08/2021,Ziextenzo Cycle 8 FOLFIRINOX 04/22/2021, Ziextenzo 05/01/2021-multidisciplinary consultation at [facility], changed to gemcitabine/Abraxane recommended Cycle 1 gemcitabine/Abraxane 05/06/2021 Cycle 2 gemcitabine/Abraxane 05/22/2021, gemcitabine dose reduced secondary to thrombocytopenia Cycle 3 gemcitabine/Abraxane 06/05/2021 Cycle 4 gemcitabine/Abraxane 06/19/2021 07/03/2021 CTs at [facility]-ill-defined hypoenhancing pancreatic head mass measures 2.3 cm.  Focal abutment and distortion/invasion of SMV with slightly increased nonocclusive thrombus within the portal vein and upper SMV.  Similar indeterminate retroperitoneal lymph nodes.  Mild central intrahepatic biliary dilatation with loss of previously seen pneumobilia. Dr. Trey Paula recommendation is for another month of chemotherapy and then restaging prior to surgery Cycle 5 gemcitabine 07/13/2021, Abraxane held due to neuropathy Cycle 6 gemcitabine 07/28/2021, Abraxane held due to neuropathy Cycle 7 gemcitabine/Abraxane 08/13/2021 CTs at [facility] 08/21/2021-similar nonocclusive thrombus in the  portal confluence extending into the SMV, increased intrahepatic biliary ductal dilatation, stable hypoenhancing pancreas head mass with short segment abutment of the portal vein/SMV, similar prominent retroperitoneal nodes Progressive elevation of CA 19-9 09/11/2021 Cycle 1 5-FU/liposomal irinotecan 09/29/2021 Cycle 2 5-FU/liposomal irinotecan 10/13/2021   Painless jaundice-internal/external biliary drain placed 11/12/2020.  Biliary stent placed and drain exchange 11/19/2020, exchange of internal/external drain 11/28/2020 09/01/2021-ERCP, repositioning of biliary stent, placement of a second covered metal stent in the common bile duct Weight loss Diabetes diagnosed June 2022 Change in bowel habits February 2022 Colonoscopy 08/06/2020-8 mm polyp in the sigmoid colon, a few small and large mouth diverticula in the sigmoid colon, normal mucosa found in the entire colon with biopsies for histology taken with a cold forceps from the right colon and left colon for evaluation of microscopic colitis (right colon-colonic mucosa with no significant pathologic findings; left colon-colonic mucosa with no significant pathologic findings; sigmoid polyp-tubular adenoma, negative for high-grade dysplasia). 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  He is currently maintained off of specific treatment.  He has increased abdomen/back pain, likely secondary to local progression of the cancer.  He declines radiation.  He does not wish to enroll in hospice care at present.  He will try hydromorphone for pain.  The liver enzymes are elevated today.  I will refer him yesterday neurology to consider placement of a new bile duct stent.  He last underwent placement of a partially covered metal stent at [facility] on  09/01/2021.  Betsy Coder, MD  12/23/2021  11:11 AM

## 2021-12-24 ENCOUNTER — Encounter (HOSPITAL_COMMUNITY): Payer: Self-pay | Admitting: Student

## 2021-12-24 DIAGNOSIS — I10 Essential (primary) hypertension: Secondary | ICD-10-CM | POA: Diagnosis not present

## 2021-12-24 DIAGNOSIS — R748 Abnormal levels of other serum enzymes: Secondary | ICD-10-CM | POA: Diagnosis not present

## 2021-12-24 DIAGNOSIS — I2510 Atherosclerotic heart disease of native coronary artery without angina pectoris: Secondary | ICD-10-CM | POA: Diagnosis not present

## 2021-12-24 DIAGNOSIS — C250 Malignant neoplasm of head of pancreas: Secondary | ICD-10-CM | POA: Diagnosis not present

## 2021-12-24 LAB — COMPREHENSIVE METABOLIC PANEL
ALT: 193 U/L — ABNORMAL HIGH (ref 0–44)
AST: 215 U/L — ABNORMAL HIGH (ref 15–41)
Albumin: 3.5 g/dL (ref 3.5–5.0)
Alkaline Phosphatase: 1225 U/L — ABNORMAL HIGH (ref 38–126)
Anion gap: 9 (ref 5–15)
BUN: 13 mg/dL (ref 8–23)
CO2: 24 mmol/L (ref 22–32)
Calcium: 8.5 mg/dL — ABNORMAL LOW (ref 8.9–10.3)
Chloride: 100 mmol/L (ref 98–111)
Creatinine, Ser: 1.06 mg/dL (ref 0.61–1.24)
GFR, Estimated: >60 mL/min (ref >60)
Glucose, Bld: 118 mg/dL — ABNORMAL HIGH (ref 70–99)
Potassium: 3.6 mmol/L (ref 3.5–5.1)
Sodium: 133 mmol/L — ABNORMAL LOW (ref 135–145)
Total Bilirubin: 2.6 mg/dL — ABNORMAL HIGH (ref 0.3–1.2)
Total Protein: 6.6 g/dL (ref 6.5–8.1)

## 2021-12-24 LAB — CBC
HCT: 29.8 % — ABNORMAL LOW (ref 39.0–52.0)
Hemoglobin: 9.8 g/dL — ABNORMAL LOW (ref 13.0–17.0)
MCH: 30.4 pg (ref 26.0–34.0)
MCHC: 32.9 g/dL (ref 30.0–36.0)
MCV: 92.5 fL (ref 80.0–100.0)
Platelets: 150 10*3/uL (ref 150–400)
RBC: 3.22 MIL/uL — ABNORMAL LOW (ref 4.22–5.81)
RDW: 14.5 % (ref 11.5–15.5)
WBC: 8.2 10*3/uL (ref 4.0–10.5)
nRBC: 0 % (ref 0.0–0.2)

## 2021-12-24 LAB — GLUCOSE, CAPILLARY: Glucose-Capillary: 103 mg/dL — ABNORMAL HIGH (ref 70–99)

## 2021-12-24 SURGERY — ERCP, WITH INTERVENTION IF INDICATED
Anesthesia: General

## 2021-12-24 MED ORDER — HYDROMORPHONE HCL 2 MG PO TABS
4.0000 mg | ORAL_TABLET | ORAL | Status: DC | PRN
  Administered 2021-12-24: 8 mg via ORAL
  Filled 2021-12-24: qty 4

## 2021-12-24 MED ORDER — HEPARIN SOD (PORK) LOCK FLUSH 100 UNIT/ML IV SOLN
500.0000 [IU] | INTRAVENOUS | Status: AC | PRN
  Administered 2021-12-24: 500 [IU]

## 2021-12-24 MED ORDER — ATORVASTATIN CALCIUM 40 MG PO TABS: 40.0000 mg | ORAL_TABLET | Freq: Every day | ORAL | Status: AC

## 2021-12-24 MED ORDER — APIXABAN 2.5 MG PO TABS
5.0000 mg | ORAL_TABLET | Freq: Two times a day (BID) | ORAL | Status: DC
  Administered 2021-12-24: 5 mg via ORAL
  Filled 2021-12-24: qty 2

## 2021-12-24 NOTE — Progress Notes (Signed)
Mobility Specialist - Progress Note   12/24/21 1035  Mobility  Activity Ambulated independently in hallway  Activity Response Tolerated well  Distance Ambulated (ft) 500 ft  $Mobility charge 1 Mobility  Level of Assistance Independent  Assistive Device None  HOB Elevated/Bed Position Self regulated  Range of Motion/Exercises Active  Transport method Ambulatory  Mobility Referral Yes   Pt received in bed and agreeable to mobility. No complaints during mobility. Pt to bed after session with all needs met.    [facility]

## 2021-12-24 NOTE — Discharge Summary (Signed)
Physician Discharge Summary  Francisco Frank:[id number] DOB: [birth year] DOA: 12/23/2021  PCP: Kennieth Rad, MD  Admit date: 12/23/2021 Discharge date: 12/24/2021 Admitted From: Home Disposition: Home Recommendations for Outpatient Follow-up:  Follow up with GI and oncology in 1 to 2 weeks Check CMP and CBC in 1 week Consider palliative referral Please follow up on the following pending results: None  Home Health: Not indicated Equipment/Devices: Not indicated  Discharge Condition: Stable but guarded prognosis CODE STATUS: Full code  Follow-up Information     Ladene Artist, MD. Schedule an appointment as soon as possible for a visit in 1 week(s).   Specialty: Gastroenterology Contact information: 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  Patient was not in any distress other than chronic abdominal pain he has been managing with pain medication.  He has no fever, chills, nausea, vomiting or bowel habit change.  On admission, patient was well-appearing.  Vitals within normal.  CMP significant for glucose 168, ALP 1113, AST 250, ALT 230 and total bili of 1.6.  CBC significant for Hgb of 10.7.  CK within normal.  GI consulted by oncology, recommended CT abdomen and pelvis, which showed new thrombus within the portal confluence and portal veins, stable appearance of SMV thrombosis, stable pancreatic head mass with atrophy and ductal dilation of the pancreatic body and tail, stable retroperitoneal and periportal LAD, CBD stent in adequate position, slightly more prominent mild intrahepatic biliary ductal dilation and new nodular densities in the lung bases measuring up to 11 mm concerning for metastatic  disease.  Patient was started on IV heparin overnight.    Patient remained stable overnight.  Per GI, Dr. Norberto Sorenson "would need a 2 day Eliquis wash out for ERCP however given the new thrombus, suspected lung mets, no evidence of cholangitis and plans for comfort care would not recommend ERCP at this time".  Repeat LFTs stable.  Patient is discharged on home Eliquis to follow-up with GI and oncology.   See individual problem list below for more.   Problems addressed during this hospitalization Principal Problem:   Elevated liver enzymes Active Problems:   Essential hypertension   Uncontrolled type 2 diabetes mellitus with hyperglycemia, with long-term current use of insulin (HCC)   Coronary artery disease involving native coronary artery of native heart without angina pectoris   Pancreatic cancer (HCC)   Portal vein thrombosis              Vital signs Vitals:   12/23/21 2216 12/24/21 0255 12/24/21 0631 12/24/21 1041  BP: 104/75 100/62 114/66 94/65  Pulse: 60 67 73 67  Temp: 99.1 F (37.3 C) 98.7 F (37.1 C) (!) 97.3 F (36.3 C) 98.6 F (37 C)  Resp: '17 18 18 18  '$ Height:      Weight:      SpO2: 91% 99% 99% 99%  TempSrc: Oral Oral  Oral  BMI (Calculated):         Discharge exam  GENERAL: No apparent distress.  Nontoxic. HEENT: MMM.  Vision and hearing grossly intact.  NECK: Supple.  No apparent JVD.  RESP:  No IWOB.  Fair aeration bilaterally. CVS:  RRR. Heart sounds normal.  ABD/GI/GU: BS+. Abd soft, NTND.  MSK/EXT:  Moves extremities. No apparent deformity. No edema.  SKIN: no apparent skin lesion or wound NEURO: Awake and alert. Oriented appropriately.  No apparent focal neuro deficit. PSYCH: Calm. Normal affect.   Discharge Instructions Discharge Instructions     Call MD for:  extreme fatigue   Complete by: As directed    Call MD for:  persistant nausea and vomiting   Complete by: As directed    Call MD for:  severe uncontrolled pain   Complete by: As  directed    Diet - low sodium heart healthy   Complete by: As directed    Diet Carb Modified   Complete by: As directed    Discharge instructions   Complete by: As directed    It has been a pleasure taking care of you!  You were hospitalized due to elevated liver enzymes.  Your CT scan shows stable pancreatic cancer with stable stent but new blood clot in portal vein (blood vessel caring blood to your liver) and lung nodule concerning for metastasis.  Our gastroenterologist did not feel ERCP of stent exchange is needed at this time.  Please follow-up with oncology and gastroenterology in 1 to 2 weeks or sooner if needed.   Take care,   Increase activity slowly   Complete by: As directed       Allergies as of 12/24/2021       Reactions   Hydrocodone-acetaminophen Itching        Medication List     TAKE these medications    atenolol 50 MG tablet Commonly known as: TENORMIN Take 50 mg by mouth daily.   atorvastatin 40 MG tablet Commonly known as: LIPITOR Take 1 tablet (40 mg total) by mouth daily. Start taking on: January 07, 2022 What changed: These instructions start on January 07, 2022. If you are unsure what to do until then, ask your doctor or other care provider.   Basaglar KwikPen 100 UNIT/ML Inject 7 Units into the skin at bedtime.   BD Pen Needle Nano 2nd Gen 32G X 4 MM Misc Generic drug: Insulin Pen Needle   clopidogrel 75 MG tablet Commonly known as: PLAVIX Take 75 mg by mouth daily.   diphenhydramine-acetaminophen 25-500 MG Tabs tablet Commonly known as: TYLENOL PM Take 1 tablet by mouth at bedtime as needed.   Eliquis 5 MG Tabs tablet Generic drug: apixaban TAKE 1 TABLET BY MOUTH TWICE A DAY What changed: how much to take   HYDROmorphone 4 MG tablet Commonly known as: Dilaudid Take 1-2 tablets (4-8 mg total) by mouth every 4 (four) hours as needed for severe pain.   hyoscyamine 0.125 MG tablet Commonly known as: LEVSIN Take 1 tablet (0.125  mg total) by mouth every 6 (six) hours as needed for cramping.   Klor-Con M20 20 MEQ tablet Generic drug: potassium chloride SA TAKE 1 TABLET BY MOUTH EVERY DAY   loperamide 2 MG capsule Commonly known as: IMODIUM Take 1 capsule (2 mg total) by mouth as needed for diarrhea or loose stools.   NovoLOG FlexPen 100 UNIT/ML FlexPen Generic drug: insulin aspart Inject 2-10 Units into the skin 3 (three) times daily as needed for high blood sugar. If BS is 150-200=2 units, 201-250=4 units, 251-300=6 units, 301-350=8 units, 351-400=10 units.   prochlorperazine 10 MG tablet Commonly known as: COMPAZINE Take 1 tablet (10 mg total) by mouth every 6 (six) hours as needed for nausea or vomiting.   traMADol 50 MG tablet  Commonly known as: ULTRAM Take 50 mg by mouth daily.        Consultations: Oncology GI  Procedures/Studies:   CT ABDOMEN PELVIS W CONTRAST  Result Date: 12/23/2021 CLINICAL DATA:  Pancreatic cancer.  Monitor biliary obstruction. EXAM: CT ABDOMEN AND PELVIS WITH CONTRAST TECHNIQUE: Multidetector CT imaging of the abdomen and pelvis was performed using the standard protocol following bolus administration of intravenous contrast. RADIATION DOSE REDUCTION: This exam was performed according to the departmental dose-optimization program which includes automated exposure control, adjustment of the mA and/or kV according to patient size and/or use of iterative reconstruction technique. CONTRAST:  142m OMNIPAQUE IOHEXOL 300 MG/ML  SOLN COMPARISON:  CT abdomen 04/06/2021 FINDINGS: Lower chest: There are few new nodular densities in the lung bases measuring up to 11 mm image 4/24. Hepatobiliary: Common bile duct stent is in adequate position and appears unchanged. Mild intrahepatic biliary ductal dilatation is present along with pneumobilia, slightly more prominent when compared to the prior exam. No gallstones are identified. No new focal liver lesions are identified. Pancreas: There is  enlargement of the pancreatic head measuring approximately 2.5 x 3.5 cm which appears similar to prior study. There is diffuse atrophy of the pancreatic body and tail with ductal dilatation which appears unchanged. Spleen: Normal in size without focal abnormality. Adrenals/Urinary Tract: Adrenal glands are unremarkable. Kidneys are normal, without renal calculi, focal lesion, or hydronephrosis. Bladder is unremarkable. Stomach/Bowel: Stomach is within normal limits. Appendix appears normal. No bowel obstruction, pneumatosis or free air. There is mild inflammatory stranding of the proximal duodenal. There is a large amount of stool throughout the colon. Vascular/Lymphatic: Aorta and IVC are normal in size. There are atherosclerotic calcifications of the aorta. Thrombus is identified within the superior mesenteric vein as seen on prior. There is new thrombus within the portal confluence and portal veins. Splenic vein is grossly patent. Enlarged left retroperitoneal lymph nodes measure up to 11 mm similar to the prior study. Enlarged aortocaval lymph nodes measuring 11 mm are similar to prior. Enlarged periportal lymph nodes measuring up to 11 mm are similar to prior. Reproductive: Prostate is unremarkable. Other: No abdominal wall hernia or abnormality. No abdominopelvic ascites. Musculoskeletal: Multilevel degenerative changes throughout the spine. No acute fracture or focal osseous lesion identified. IMPRESSION: 1. New thrombus within the portal confluence and portal veins. 2. 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 3. Stable pancreatic head mass with atrophy and ductal dilatation of the pancreatic body and tail. 4. Stable retroperitoneal and periportal lymphadenopathy. 5. Common bile duct stent in adequate position. Mild intrahepatic biliary ductal dilatation is slightly more prominent when compared to the prior study. 6. New nodular densities in the lung bases measuring up to 11 mm worrisome for  metastatic disease. Aortic Atherosclerosis (ICD10-I70.0). Electronically Signed   By: ARonney AstersM.D.   On: 12/23/2021 21:07       The results of significant diagnostics from this hospitalization (including imaging, microbiology, ancillary and laboratory) are listed below for reference.     Microbiology: No results found for this or any previous visit (from the past 240 hour(s)).   Labs:  CBC: Recent Labs  Lab 12/23/21 1811 12/24/21 0331  WBC 7.0 8.2  NEUTROABS 3.6  --   HGB 10.7* 9.8*  HCT 33.0* 29.8*  MCV 92.4 92.5  PLT 169 150   BMP &GFR Recent Labs  Lab 12/23/21 1009 12/24/21 1100  NA 135 133*  K 3.5 3.6  CL 100 100  CO2 26 24  GLUCOSE 168* 118*  BUN 10 13  CREATININE 1.00 1.06  CALCIUM 9.1 8.5*   Estimated Creatinine Clearance: 60.9 mL/min (by C-G formula based on SCr of 1.06 mg/dL). Liver & Pancreas: Recent Labs  Lab 12/23/21 1009 12/24/21 1100  AST 250* 215*  ALT 230* 193*  ALKPHOS 1,113* 1,225*  BILITOT 1.6* 2.6*  PROT 6.7 6.6  ALBUMIN 4.0 3.5   Recent Labs  Lab 12/23/21 1811  LIPASE 19   No results for input(s): "AMMONIA" in the last 168 hours. Diabetic: Recent Labs    12/23/21 1811  HGBA1C 8.0*   Recent Labs  Lab 12/23/21 2226 12/24/21 0732  GLUCAP 98 103*   Cardiac Enzymes: Recent Labs  Lab 12/23/21 1811  CKTOTAL 64   No results for input(s): "PROBNP" in the last 8760 hours. Coagulation Profile: No results for input(s): "INR", "PROTIME" in the last 168 hours. Thyroid Function Tests: No results for input(s): "TSH", "T4TOTAL", "FREET4", "T3FREE", "THYROIDAB" in the last 72 hours. Lipid Profile: No results for input(s): "CHOL", "HDL", "LDLCALC", "TRIG", "CHOLHDL", "LDLDIRECT" in the last 72 hours. Anemia Panel: No results for input(s): "VITAMINB12", "FOLATE", "FERRITIN", "TIBC", "IRON", "RETICCTPCT" in the last 72 hours. Urine analysis:    Component Value Date/Time   COLORURINE YELLOW 12/12/2020 0714   APPEARANCEUR  HAZY (A) 12/12/2020 0714   LABSPEC 1.030 12/12/2020 0714   PHURINE 6.0 12/12/2020 0714   GLUCOSEU NEGATIVE 12/12/2020 0714   HGBUR NEGATIVE 12/12/2020 0714   BILIRUBINUR MODERATE (A) 12/12/2020 0714   KETONESUR NEGATIVE 12/12/2020 0714   PROTEINUR 100 (A) 12/12/2020 0714   NITRITE NEGATIVE 12/12/2020 0714   LEUKOCYTESUR NEGATIVE 12/12/2020 0714   Sepsis Labs: Invalid input(s): "PROCALCITONIN", "LACTICIDVEN"   SIGNED:  Mercy Riding, MD  [facility] 12/24/2021, 3:17 PM

## 2021-12-24 NOTE — Progress Notes (Signed)
IP PROGRESS NOTE  Subjective:   Francisco Frank continues to have abdomen and back pain.  The pain is not relieved with tramadol.  He has transient relief with IV Dilaudid.  Objective: Vital signs in last 24 hours: Blood pressure 94/65, pulse 67, temperature 98.6 F (37 C), temperature source Oral, resp. rate 18, height '5\' 8"'$  (1.727 m), weight 154 lb 9.4 oz (70.1 kg), SpO2 99 %.  Intake/Output from previous day: 10/04 0701 - 10/05 0700 In: 1264.6 [P.O.:1240; I.V.:24.6] Out: 0   Physical Exam:  Abdomen: Soft and nontender, no mass   Portacath/PICC-without erythema  Lab Results: Recent Labs    12/23/21 1811 12/24/21 0331  WBC 7.0 8.2  HGB 10.7* 9.8*  HCT 33.0* 29.8*  PLT 169 150    BMET Recent Labs    12/23/21 1009 12/24/21 1100  NA 135 133*  K 3.5 3.6  CL 100 100  CO2 26 24  GLUCOSE 168* 118*  BUN 10 13  CREATININE 1.00 1.06  CALCIUM 9.1 8.5*    Lab Results  Component Value Date   CAN199 5,537 (H) 11/10/2021    Studies/Results: CT ABDOMEN PELVIS W CONTRAST  Result Date: 12/23/2021 CLINICAL DATA:  Pancreatic cancer.  Monitor biliary obstruction. EXAM: CT ABDOMEN AND PELVIS WITH CONTRAST TECHNIQUE: Multidetector CT imaging of the abdomen and pelvis was performed using the standard protocol following bolus administration of intravenous contrast. RADIATION DOSE REDUCTION: This exam was performed according to the departmental dose-optimization program which includes automated exposure control, adjustment of the mA and/or kV according to patient size and/or use of iterative reconstruction technique. CONTRAST:  191m OMNIPAQUE IOHEXOL 300 MG/ML  SOLN COMPARISON:  CT abdomen 04/06/2021 FINDINGS: Lower chest: There are few new nodular densities in the lung bases measuring up to 11 mm image 4/24. Hepatobiliary: Common bile duct stent is in adequate position and appears unchanged. Mild intrahepatic biliary ductal dilatation is present along with pneumobilia, slightly more  prominent when compared to the prior exam. No gallstones are identified. No new focal liver lesions are identified. Pancreas: There is enlargement of the pancreatic head measuring approximately 2.5 x 3.5 cm which appears similar to prior study. There is diffuse atrophy of the pancreatic body and tail with ductal dilatation which appears unchanged. Spleen: Normal in size without focal abnormality. Adrenals/Urinary Tract: Adrenal glands are unremarkable. Kidneys are normal, without renal calculi, focal lesion, or hydronephrosis. Bladder is unremarkable. Stomach/Bowel: Stomach is within normal limits. Appendix appears normal. No bowel obstruction, pneumatosis or free air. There is mild inflammatory stranding of the proximal duodenal. There is a large amount of stool throughout the colon. Vascular/Lymphatic: Aorta and IVC are normal in size. There are atherosclerotic calcifications of the aorta. Thrombus is identified within the superior mesenteric vein as seen on prior. There is new thrombus within the portal confluence and portal veins. Splenic vein is grossly patent. Enlarged left retroperitoneal lymph nodes measure up to 11 mm similar to the prior study. Enlarged aortocaval lymph nodes measuring 11 mm are similar to prior. Enlarged periportal lymph nodes measuring up to 11 mm are similar to prior. Reproductive: Prostate is unremarkable. Other: No abdominal wall hernia or abnormality. No abdominopelvic ascites. Musculoskeletal: Multilevel degenerative changes throughout the spine. No acute fracture or focal osseous lesion identified. IMPRESSION: 1. New thrombus within the portal confluence and portal veins. 2. 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 3. Stable pancreatic head mass with atrophy and ductal dilatation of the pancreatic body and tail. 4. Stable retroperitoneal and periportal lymphadenopathy.  5. Common bile duct stent in adequate position. Mild intrahepatic biliary ductal dilatation is  slightly more prominent when compared to the prior study. 6. New nodular densities in the lung bases measuring up to 11 mm worrisome for metastatic disease. Aortic Atherosclerosis (ICD10-I70.0). Electronically Signed   By: Ronney Asters M.D.   On: 12/23/2021 21:07    CT images reviewed  Medications: I have reviewed the patient's current medications.  Assessment/Plan: Pancreas cancer Outside CT-apparent pancreas abnormality (we do not have a copy of the report) MRI abdomen 11/06/2020-pancreatic body and tail atrophy with duct dilatation up to 9 mm.  Both ducts undergo an abrupt transition in the region of the pancreatic head.  Non border deforming pancreatic head mass measuring 3.2 x 2.6 cm.  No arterial involvement by tumor.  SMV adjacent to presumed tumor without encasement.  Portal vein uninvolved.  No abdominal adenopathy.  Left periaortic 7 mm node not pathologic by size criteria.  No ascites.  Subtle left-sided pericolonic nodule 5 mm. EUS/ERCP 11/11/2020-EGD showed gastritis which was biopsied, mucosal changes in the duodenum, mucosal changes in the duodenum sweep (gastric antral and oxyntic mucosa with no specific histopathologic changes; Warthin Starry stain negative for H pylori).  EUS showed an irregular mass in the pancreatic head measuring 26 mm x 30 mm; the outer margins were irregular.  There was sonographic evidence suggesting invasion into the portal vein (manifested by abutment) and the superior mesenteric vein (manifested by abutment); an intact interface was seen between the mass and the superior mesenteric artery and celiac trunk suggesting a lack of invasion.  Endosonographic imaging in the visualized portion of the liver showed no mass.  There was dilatation of the common bile duct and the common hepatic duct.  No malignant appearing lymph nodes were visualized in the celiac region, peripancreatic region, porta hepatis region.  FNA biopsy of the pancreas head mass showed malignant cells  consistent with adenocarcinoma.  Biliary access could not be obtained.  Cholangiogram 11/12/2020-severe intrahepatic biliary dilatation.  A peripheral right hepatic bile duct was successfully cannulated.  Internal/external biliary drain placed.  Cytology on biliary drain bile showed malignant cells consistent with adenocarcinoma.   CT chest 11/13/2020-no evidence of metastatic disease.   CT pelvis 11/13/2020-nonspecific circumferential wall thickening of the cecum measuring up to 12 mm in thickness.  Otherwise no evidence of intrapelvic metastases.   11/19/2020-biliary stent placement, biliary drain exchange 11/28/2020-exchange of internal/external biliary drain secondary to persistent jaundice CT abdomen/pelvis 11/28/2020-pancreas head mass, 3.5 x 2.8 cm, no encasement of the celiac trunk or SMA, partial encasement of the portal venous confluence-at least 180 degrees, partial encasement of the SMV-approximately 180 degrees, ventral extension of tumor abuts the posterior duodenum, no adenopathy, percutaneous biliary stent with decompression of previous dilated intrahepatic and common bile ducts Cycle 1 FOLFIRINOX 01/01/2021, irinotecan held Cycle 2 FOLFIRINOX 01/13/2021, irinotecan held Cycle 3 FOLFIRINOX 01/28/2021 Cycle 4 FOLFIRINOX 02/09/2021 Cycle 5 FOLFIRINOX 03/03/2021, Ziextenzo Cycle 6 FOLFIRINOX 03/25/2021, Ziextenzo CT abdomen/pelvis 04/06/2021-no change in pancreas head mass with partial encasement of the portal venous confluence, nonocclusive SMV thrombus, borderline upper abdominal lymph nodes-unchanged, no evidence of liver metastases Cycle 7 FOLFIRINOX 04/08/2021,Ziextenzo Cycle 8 FOLFIRINOX 04/22/2021, Ziextenzo 05/01/2021-multidisciplinary consultation at [facility], changed to gemcitabine/Abraxane recommended Cycle 1 gemcitabine/Abraxane 05/06/2021 Cycle 2 gemcitabine/Abraxane 05/22/2021, gemcitabine dose reduced secondary to thrombocytopenia Cycle 3 gemcitabine/Abraxane 06/05/2021 Cycle 4  gemcitabine/Abraxane 06/19/2021 07/03/2021 CTs at [facility]-ill-defined hypoenhancing pancreatic head mass measures 2.3 cm.  Focal abutment and distortion/invasion of SMV with slightly increased nonocclusive  thrombus within the portal vein and upper SMV.  Similar indeterminate retroperitoneal lymph nodes.  Mild central intrahepatic biliary dilatation with loss of previously seen pneumobilia. Dr. Trey Paula recommendation is for another month of chemotherapy and then restaging prior to surgery Cycle 5 gemcitabine 07/13/2021, Abraxane held due to neuropathy Cycle 6 gemcitabine 07/28/2021, Abraxane held due to neuropathy Cycle 7 gemcitabine/Abraxane 08/13/2021 CT[facility] at [facility] 08/21/2021-similar nonocclusive thrombus in the portal confluence extending into the SMV, increased intrahepatic biliary ductal dilatation, stable hypoenhancing pancreas head mass with short segment abutment of the portal vein/SMV, similar prominent retroperitoneal nodes Progressive elevation of CA 19-9 09/11/2021 Cycle 1 5-FU/liposomal irinotecan 09/29/2021 Cycle 2 5-FU/liposomal irinotecan 10/13/2021 CT abdomen/pelvis 12/23/2021-stable SMV thrombus with new thrombus in the portal vein and portal confluence, stable pancreas head mass, stable retroperitoneal/periportal lymphadenopathy, mild intrahepatic biliary duct dilation-slightly more prominent compared to January 2023, new nodular densities at the lung bases   Painless jaundice-internal/external biliary drain placed 11/12/2020.  Biliary stent placed and drain exchange 11/19/2020, exchange of internal/external drain 11/28/2020 09/01/2021-ERCP, repositioning of biliary stent, placement of a second covered metal stent in the common bile duct Weight loss Diabetes diagnosed June 2022 Change in bowel habits February 2022 Colonoscopy 08/06/2020-8 mm polyp in the sigmoid colon, a few small and large mouth diverticula in the sigmoid colon, normal mucosa found in the entire colon with biopsies for histology  taken with a cold forceps from the right colon and left colon for evaluation of microscopic colitis (right colon-colonic mucosa with no significant pathologic findings; left colon-colonic mucosa with no significant pathologic findings; sigmoid polyp-tubular adenoma, negative for high-grade dysplasia). 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  He has increased abdomen/back pain, likely secondary to the primary tumor.  The CT abdomen/pelvis yesterday does not reveal clear evidence of disease progression in the abdomen, but he has lower lung nodules suspicious for metastases.  He is currently maintained off of specific therapy for pancreas cancer.  The goal is comfort care.  The liver enzymes and bilirubin were elevated yesterday.  The bilirubin is higher today.  I will defer the plan for an ERCP and stent evaluation to gastroenterology.  I added hydromorphone for pain.  Outpatient follow-up is scheduled at the Cancer center for 01/22/2022.  We will arrange for repeat liver enzymes in our office or with GI.  I recommend continuing apixaban anticoagulation.     LOS: 1 day   Betsy Coder, MD   12/24/2021, 11:55 AM

## 2021-12-25 ENCOUNTER — Other Ambulatory Visit: Payer: Self-pay | Admitting: *Deleted

## 2021-12-25 ENCOUNTER — Telehealth: Payer: Self-pay | Admitting: *Deleted

## 2021-12-25 DIAGNOSIS — C250 Malignant neoplasm of head of pancreas: Secondary | ICD-10-CM

## 2021-12-25 LAB — CANCER ANTIGEN 19-9: CA 19-9: 18518 U/mL — ABNORMAL HIGH (ref 0–35)

## 2021-12-25 NOTE — Telephone Encounter (Signed)
Per Dr. Benay Spice: Needs lab on 10/10 or 10/11 next week to f/u on liver functions. He needs to call for fever, increased abdominal pain or jaundice, or inability to keep po's. Notified Mr. Dorton and he expressed significant frustration with his recent hospitalization. Does not feel he received the service he needed. He reports he has vomited X 4 since yesterday--emesis looks clear. Decided to take compazine dose this am at 0900 and has not vomited so far. Pain is controlled. Encourage him to start with clear liquids, then full liquids then soft/bland diet. Offered to bring him in today for IV fluids and he declines. Offered to provide phone number to call for "Patient Experience" and he declines at this time.

## 2021-12-28 ENCOUNTER — Encounter: Payer: Self-pay | Admitting: *Deleted

## 2021-12-28 ENCOUNTER — Other Ambulatory Visit: Payer: Self-pay | Admitting: Oncology

## 2021-12-28 DIAGNOSIS — C250 Malignant neoplasm of head of pancreas: Secondary | ICD-10-CM

## 2021-12-28 NOTE — Progress Notes (Signed)
Duplicate refill request for Levsin received by pharmacy. Already has his refill from 12/23/21 on file. Insurance no longer covers this drug. Per pharmacy, using [employer] it costs '@14'$ .79. They will proceed to refill for him.

## 2021-12-29 ENCOUNTER — Telehealth: Payer: Self-pay | Admitting: *Deleted

## 2021-12-29 ENCOUNTER — Inpatient Hospital Stay: Payer: Medicare Other

## 2021-12-29 DIAGNOSIS — M549 Dorsalgia, unspecified: Secondary | ICD-10-CM | POA: Diagnosis not present

## 2021-12-29 DIAGNOSIS — R109 Unspecified abdominal pain: Secondary | ICD-10-CM | POA: Diagnosis not present

## 2021-12-29 DIAGNOSIS — C250 Malignant neoplasm of head of pancreas: Secondary | ICD-10-CM | POA: Diagnosis present

## 2021-12-29 LAB — CMP (CANCER CENTER ONLY)
ALT: 141 U/L — ABNORMAL HIGH (ref 0–44)
AST: 142 U/L — ABNORMAL HIGH (ref 15–41)
Albumin: 3.8 g/dL (ref 3.5–5.0)
Alkaline Phosphatase: 1104 U/L — ABNORMAL HIGH (ref 38–126)
Anion gap: 8 (ref 5–15)
BUN: 9 mg/dL (ref 8–23)
CO2: 27 mmol/L (ref 22–32)
Calcium: 9.2 mg/dL (ref 8.9–10.3)
Chloride: 104 mmol/L (ref 98–111)
Creatinine: 0.96 mg/dL (ref 0.61–1.24)
GFR, Estimated: >60 mL/min (ref >60)
Glucose, Bld: 198 mg/dL — ABNORMAL HIGH (ref 70–99)
Potassium: 3.6 mmol/L (ref 3.5–5.1)
Sodium: 139 mmol/L (ref 135–145)
Total Bilirubin: 1 mg/dL (ref 0.3–1.2)
Total Protein: 7 g/dL (ref 6.5–8.1)

## 2021-12-29 NOTE — Telephone Encounter (Signed)
Notified [name] that AST/ALT and total bilirubin are improved. Per Dr. Benay Spice: call for fever, severe abdominal pain. F/U as scheduled.

## 2022-01-14 ENCOUNTER — Other Ambulatory Visit: Payer: Self-pay | Admitting: Oncology

## 2022-01-14 DIAGNOSIS — C250 Malignant neoplasm of head of pancreas: Secondary | ICD-10-CM

## 2022-01-18 ENCOUNTER — Other Ambulatory Visit: Payer: Self-pay | Admitting: Oncology

## 2022-01-22 ENCOUNTER — Other Ambulatory Visit: Payer: Self-pay | Admitting: *Deleted

## 2022-01-22 ENCOUNTER — Inpatient Hospital Stay: Payer: Medicare Other

## 2022-01-22 ENCOUNTER — Inpatient Hospital Stay: Payer: Medicare Other | Attending: Nurse Practitioner

## 2022-01-22 ENCOUNTER — Inpatient Hospital Stay (HOSPITAL_BASED_OUTPATIENT_CLINIC_OR_DEPARTMENT_OTHER): Payer: Medicare Other | Admitting: Oncology

## 2022-01-22 VITALS — BP 105/79 | HR 82 | Temp 97.9°F | Resp 20 | Ht 68.0 in | Wt 146.4 lb

## 2022-01-22 DIAGNOSIS — C250 Malignant neoplasm of head of pancreas: Secondary | ICD-10-CM

## 2022-01-22 DIAGNOSIS — C251 Malignant neoplasm of body of pancreas: Secondary | ICD-10-CM | POA: Insufficient documentation

## 2022-01-22 DIAGNOSIS — Z23 Encounter for immunization: Secondary | ICD-10-CM | POA: Insufficient documentation

## 2022-01-22 DIAGNOSIS — R5381 Other malaise: Secondary | ICD-10-CM | POA: Insufficient documentation

## 2022-01-22 MED ORDER — ATENOLOL 25 MG PO TABS: 25.0000 mg | ORAL_TABLET | Freq: Every day | ORAL | 1 refills | Status: AC

## 2022-01-22 MED ORDER — HYDROMORPHONE HCL 4 MG PO TABS: 4.0000 mg | ORAL_TABLET | ORAL | 0 refills | Status: AC | PRN

## 2022-01-22 MED ORDER — PREDNISONE 10 MG PO TABS: 10.0000 mg | ORAL_TABLET | Freq: Every day | ORAL | 1 refills | Status: AC

## 2022-01-22 MED ORDER — PANCRELIPASE (LIP-PROT-AMYL) 12000-38000 UNITS PO CPEP: 12000.0000 [IU] | ORAL_CAPSULE | Freq: Three times a day (TID) | ORAL | 1 refills | Status: AC

## 2022-01-22 MED ORDER — INFLUENZA VAC A&B SA ADJ QUAD 0.5 ML IM PRSY
0.5000 mL | PREFILLED_SYRINGE | Freq: Once | INTRAMUSCULAR | Status: AC
  Administered 2022-01-22: 0.5 mL via INTRAMUSCULAR
  Filled 2022-01-22: qty 0.5

## 2022-01-22 NOTE — Progress Notes (Signed)
[place] [facility] OFFICE PROGRESS NOTE   Diagnosis: Pancreas cancer  INTERVAL HISTORY:   Francisco Frank returns as scheduled.  He reports increased malaise.  He ran out of Creon approximately 1 week ago and has developed oil in the stool.  He has increased abdominal cramping and pain.  He takes Dilaudid approximately twice daily.  His teeth are yellow.  Objective:  Vital signs in last 24 hours:  Blood pressure 105/79, pulse 82, temperature 97.9 F (36.6 C), temperature source Oral, resp. rate 20, height '5\' 8"'$  (1.727 m), weight 146 lb 6.4 oz (66.4 kg), SpO2 100 %.    HEENT: Mild white coat over the tongue, no buccal thrush, sclera anicteric Resp: Lungs clear bilaterally Cardio: Regular rate and rhythm GI: Mild tenderness in the mid upper abdomen, no mass, no hepatosplenomegaly Vascular: No leg edema  Skin: No jaundice  Portacath/PICC-without erythema  Lab Results:  Lab Results  Component Value Date   WBC 8.2 12/24/2021   HGB 9.8 (L) 12/24/2021   HCT 29.8 (L) 12/24/2021   MCV 92.5 12/24/2021   PLT 150 12/24/2021   NEUTROABS 3.6 12/23/2021    CMP  Lab Results  Component Value Date   NA 139 12/29/2021   K 3.6 12/29/2021   CL 104 12/29/2021   CO2 27 12/29/2021   GLUCOSE 198 (H) 12/29/2021   BUN 9 12/29/2021   CREATININE 0.96 12/29/2021   CALCIUM 9.2 12/29/2021   PROT 7.0 12/29/2021   ALBUMIN 3.8 12/29/2021   AST 142 (H) 12/29/2021   ALT 141 (H) 12/29/2021   ALKPHOS 1,104 (H) 12/29/2021   BILITOT 1.0 12/29/2021   GFRNONAA >60 12/29/2021    Lab Results  Component Value Date   LOV564 18,518 (H) 12/23/2021     Medications: I have reviewed the patient's current medications.   Assessment/Plan: Pancreas cancer Outside CT-apparent pancreas abnormality (we do not have a copy of the report) MRI abdomen 11/06/2020-pancreatic body and tail atrophy with duct dilatation up to 9 mm.  Both ducts undergo an abrupt transition in the region of the pancreatic head.   Non border deforming pancreatic head mass measuring 3.2 x 2.6 cm.  No arterial involvement by tumor.  SMV adjacent to presumed tumor without encasement.  Portal vein uninvolved.  No abdominal adenopathy.  Left periaortic 7 mm node not pathologic by size criteria.  No ascites.  Subtle left-sided pericolonic nodule 5 mm. EUS/ERCP 11/11/2020-EGD showed gastritis which was biopsied, mucosal changes in the duodenum, mucosal changes in the duodenum sweep (gastric antral and oxyntic mucosa with no specific histopathologic changes; Warthin Starry stain negative for H pylori).  EUS showed an irregular mass in the pancreatic head measuring 26 mm x 30 mm; the outer margins were irregular.  There was sonographic evidence suggesting invasion into the portal vein (manifested by abutment) and the superior mesenteric vein (manifested by abutment); an intact interface was seen between the mass and the superior mesenteric artery and celiac trunk suggesting a lack of invasion.  Endosonographic imaging in the visualized portion of the liver showed no mass.  There was dilatation of the common bile duct and the common hepatic duct.  No malignant appearing lymph nodes were visualized in the celiac region, peripancreatic region, porta hepatis region.  FNA biopsy of the pancreas head mass showed malignant cells consistent with adenocarcinoma.  Biliary access could not be obtained.  Cholangiogram 11/12/2020-severe intrahepatic biliary dilatation.  A peripheral right hepatic bile duct was successfully cannulated.  Internal/external biliary drain placed.  Cytology  on biliary drain bile showed malignant cells consistent with adenocarcinoma.   CT chest 11/13/2020-no evidence of metastatic disease.   CT pelvis 11/13/2020-nonspecific circumferential wall thickening of the cecum measuring up to 12 mm in thickness.  Otherwise no evidence of intrapelvic metastases.   11/19/2020-biliary stent placement, biliary drain exchange 11/28/2020-exchange of  internal/external biliary drain secondary to persistent jaundice CT abdomen/pelvis 11/28/2020-pancreas head mass, 3.5 x 2.8 cm, no encasement of the celiac trunk or SMA, partial encasement of the portal venous confluence-at least 180 degrees, partial encasement of the SMV-approximately 180 degrees, ventral extension of tumor abuts the posterior duodenum, no adenopathy, percutaneous biliary stent with decompression of previous dilated intrahepatic and common bile ducts Cycle 1 FOLFIRINOX 01/01/2021, irinotecan held Cycle 2 FOLFIRINOX 01/13/2021, irinotecan held Cycle 3 FOLFIRINOX 01/28/2021 Cycle 4 FOLFIRINOX 02/09/2021 Cycle 5 FOLFIRINOX 03/03/2021, Ziextenzo Cycle 6 FOLFIRINOX 03/25/2021, Ziextenzo CT abdomen/pelvis 04/06/2021-no change in pancreas head mass with partial encasement of the portal venous confluence, nonocclusive SMV thrombus, borderline upper abdominal lymph nodes-unchanged, no evidence of liver metastases Cycle 7 FOLFIRINOX 04/08/2021,Ziextenzo Cycle 8 FOLFIRINOX 04/22/2021, Ziextenzo 05/01/2021-multidisciplinary consultation at [facility], changed to gemcitabine/Abraxane recommended Cycle 1 gemcitabine/Abraxane 05/06/2021 Cycle 2 gemcitabine/Abraxane 05/22/2021, gemcitabine dose reduced secondary to thrombocytopenia Cycle 3 gemcitabine/Abraxane 06/05/2021 Cycle 4 gemcitabine/Abraxane 06/19/2021 07/03/2021 CTs at [facility]-ill-defined hypoenhancing pancreatic head mass measures 2.3 cm.  Focal abutment and distortion/invasion of SMV with slightly increased nonocclusive thrombus within the portal vein and upper SMV.  Similar indeterminate retroperitoneal lymph nodes.  Mild central intrahepatic biliary dilatation with loss of previously seen pneumobilia. Dr. Trey Paula recommendation is for another month of chemotherapy and then restaging prior to surgery Cycle 5 gemcitabine 07/13/2021, Abraxane held due to neuropathy Cycle 6 gemcitabine 07/28/2021, Abraxane held due to neuropathy Cycle 7 gemcitabine/Abraxane  08/13/2021 CT[facility] at [facility] 08/21/2021-similar nonocclusive thrombus in the portal confluence extending into the SMV, increased intrahepatic biliary ductal dilatation, stable hypoenhancing pancreas head mass with short segment abutment of the portal vein/SMV, similar prominent retroperitoneal nodes Progressive elevation of CA 19-9 09/11/2021 Cycle 1 5-FU/liposomal irinotecan 09/29/2021 Cycle 2 5-FU/liposomal irinotecan 10/13/2021   Painless jaundice-internal/external biliary drain placed 11/12/2020.  Biliary stent placed and drain exchange 11/19/2020, exchange of internal/external drain 11/28/2020 09/01/2021-ERCP, repositioning of biliary stent, placement of a second covered metal stent in the common bile duct Weight loss Diabetes diagnosed June 2022 Change in bowel habits February 2022 Colonoscopy 08/06/2020-8 mm polyp in the sigmoid colon, a few small and large mouth diverticula in the sigmoid colon, normal mucosa found in the entire colon with biopsies for histology taken with a cold forceps from the right colon and left colon for evaluation of microscopic colitis (right colon-colonic mucosa with no significant pathologic findings; left colon-colonic mucosa with no significant pathologic findings; sigmoid polyp-tubular adenoma, negative for high-grade dysplasia). 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  He is being followed with observation.  We discussed hospice care again today.  He does not wish to enroll in hospice at present.  He will continue the current narcotic pain regimen.  He will begin a trial of prednisone for malaise.  He will call if the blood sugar is elevated while on prednisone.  He checks his blood sugar 4 times per day.  Francisco Frank will return for an  office visit in 4 weeks.  Betsy Coder, MD  01/22/2022  10:05 AM

## 2022-02-01 ENCOUNTER — Other Ambulatory Visit (HOSPITAL_BASED_OUTPATIENT_CLINIC_OR_DEPARTMENT_OTHER): Payer: Self-pay

## 2022-02-01 ENCOUNTER — Other Ambulatory Visit: Payer: Self-pay | Admitting: Oncology

## 2022-02-02 ENCOUNTER — Other Ambulatory Visit: Payer: Self-pay

## 2022-02-02 DIAGNOSIS — C250 Malignant neoplasm of head of pancreas: Secondary | ICD-10-CM

## 2022-02-02 MED ORDER — PROCHLORPERAZINE MALEATE 10 MG PO TABS: 10.0000 mg | ORAL_TABLET | Freq: Four times a day (QID) | ORAL | 1 refills | Status: AC | PRN

## 2022-02-03 ENCOUNTER — Other Ambulatory Visit (HOSPITAL_BASED_OUTPATIENT_CLINIC_OR_DEPARTMENT_OTHER): Payer: Self-pay

## 2022-02-08 ENCOUNTER — Encounter: Payer: Self-pay | Admitting: *Deleted

## 2022-02-08 NOTE — Progress Notes (Signed)
PATIENT NAVIGATOR PROGRESS NOTE  Name: Francisco Frank Date: 02/08/2022 MRN: [id number]  DOB: 1951-02-19   Reason for visit:  Telephone call  Comments:  Received telephone call from patient requesting Hospice referral.   He stated that he would like to keep upcoming appt with Dr Benay Spice on 12/1 but would like to acquire Hospice services. Referral placed with [facility] per Dr Benay Spice, call and spoke with intake April and records request faxed to [phone]     Time spent counseling/coordinating care: 45-60 minutes

## 2022-02-19 ENCOUNTER — Inpatient Hospital Stay: Payer: Medicare Other

## 2022-02-19 ENCOUNTER — Inpatient Hospital Stay: Payer: Medicare Other | Admitting: Oncology

## 2022-03-22 DEATH — deceased

## 2023-06-16 ENCOUNTER — Encounter: Payer: Self-pay | Admitting: Genetic Counselor
